# Patient Record
Sex: Female | Born: 1935 | Race: White | Hispanic: No | Marital: Single | Smoking: Current every day smoker
Health system: Southern US, Community
[De-identification: ages and names within clinical notes are randomized; demographics above are authoritative.]

## PROBLEM LIST (undated history)

## (undated) DIAGNOSIS — J90 Pleural effusion, not elsewhere classified: Secondary | ICD-10-CM

## (undated) DIAGNOSIS — K7460 Unspecified cirrhosis of liver: Secondary | ICD-10-CM

## (undated) DIAGNOSIS — I10 Essential (primary) hypertension: Secondary | ICD-10-CM

## (undated) DIAGNOSIS — M549 Dorsalgia, unspecified: Secondary | ICD-10-CM

## (undated) DIAGNOSIS — Z9689 Presence of other specified functional implants: Secondary | ICD-10-CM

## (undated) DIAGNOSIS — I6529 Occlusion and stenosis of unspecified carotid artery: Secondary | ICD-10-CM

## (undated) DIAGNOSIS — J189 Pneumonia, unspecified organism: Secondary | ICD-10-CM

## (undated) DIAGNOSIS — K529 Noninfective gastroenteritis and colitis, unspecified: Secondary | ICD-10-CM

## (undated) DIAGNOSIS — E119 Type 2 diabetes mellitus without complications: Secondary | ICD-10-CM

## (undated) HISTORY — PX: CAROTID ENDARTERECTOMY: SUR193

---

## 2013-10-12 ENCOUNTER — Encounter (HOSPITAL_COMMUNITY): Payer: Self-pay | Admitting: Emergency Medicine

## 2013-10-12 ENCOUNTER — Observation Stay (HOSPITAL_COMMUNITY): Payer: Medicare HMO

## 2013-10-12 ENCOUNTER — Inpatient Hospital Stay (HOSPITAL_COMMUNITY)
Admission: EM | Admit: 2013-10-12 | Discharge: 2013-10-21 | DRG: 029 | Disposition: A | Discharge status: Skilled Nursing Facility | Payer: Medicare HMO | Attending: Internal Medicine | Admitting: Internal Medicine

## 2013-10-12 ENCOUNTER — Emergency Department (HOSPITAL_COMMUNITY): Payer: Medicare HMO

## 2013-10-12 DIAGNOSIS — F329 Major depressive disorder, single episode, unspecified: Secondary | ICD-10-CM | POA: Diagnosis present

## 2013-10-12 DIAGNOSIS — Z9889 Other specified postprocedural states: Secondary | ICD-10-CM

## 2013-10-12 DIAGNOSIS — Z79899 Other long term (current) drug therapy: Secondary | ICD-10-CM

## 2013-10-12 DIAGNOSIS — F3289 Other specified depressive episodes: Secondary | ICD-10-CM | POA: Diagnosis present

## 2013-10-12 DIAGNOSIS — E1129 Type 2 diabetes mellitus with other diabetic kidney complication: Secondary | ICD-10-CM | POA: Diagnosis present

## 2013-10-12 DIAGNOSIS — D3500 Benign neoplasm of unspecified adrenal gland: Secondary | ICD-10-CM | POA: Diagnosis present

## 2013-10-12 DIAGNOSIS — E098 Drug or chemical induced diabetes mellitus with unspecified complications: Secondary | ICD-10-CM

## 2013-10-12 DIAGNOSIS — K828 Other specified diseases of gallbladder: Secondary | ICD-10-CM | POA: Diagnosis present

## 2013-10-12 DIAGNOSIS — A498 Other bacterial infections of unspecified site: Secondary | ICD-10-CM | POA: Diagnosis present

## 2013-10-12 DIAGNOSIS — K7689 Other specified diseases of liver: Secondary | ICD-10-CM | POA: Diagnosis present

## 2013-10-12 DIAGNOSIS — M545 Low back pain, unspecified: Secondary | ICD-10-CM | POA: Diagnosis not present

## 2013-10-12 DIAGNOSIS — R339 Retention of urine, unspecified: Secondary | ICD-10-CM | POA: Diagnosis not present

## 2013-10-12 DIAGNOSIS — E871 Hypo-osmolality and hyponatremia: Secondary | ICD-10-CM | POA: Diagnosis present

## 2013-10-12 DIAGNOSIS — K529 Noninfective gastroenteritis and colitis, unspecified: Secondary | ICD-10-CM | POA: Insufficient documentation

## 2013-10-12 DIAGNOSIS — M4180 Other forms of scoliosis, site unspecified: Secondary | ICD-10-CM | POA: Diagnosis present

## 2013-10-12 DIAGNOSIS — G062 Extradural and subdural abscess, unspecified: Secondary | ICD-10-CM

## 2013-10-12 DIAGNOSIS — G061 Intraspinal abscess and granuloma: Principal | ICD-10-CM | POA: Diagnosis present

## 2013-10-12 DIAGNOSIS — I129 Hypertensive chronic kidney disease with stage 1 through stage 4 chronic kidney disease, or unspecified chronic kidney disease: Secondary | ICD-10-CM | POA: Diagnosis present

## 2013-10-12 DIAGNOSIS — K760 Fatty (change of) liver, not elsewhere classified: Secondary | ICD-10-CM | POA: Diagnosis present

## 2013-10-12 DIAGNOSIS — I509 Heart failure, unspecified: Secondary | ICD-10-CM | POA: Diagnosis present

## 2013-10-12 DIAGNOSIS — D649 Anemia, unspecified: Secondary | ICD-10-CM

## 2013-10-12 DIAGNOSIS — E1165 Type 2 diabetes mellitus with hyperglycemia: Secondary | ICD-10-CM | POA: Diagnosis not present

## 2013-10-12 DIAGNOSIS — E119 Type 2 diabetes mellitus without complications: Secondary | ICD-10-CM | POA: Diagnosis present

## 2013-10-12 DIAGNOSIS — M5137 Other intervertebral disc degeneration, lumbosacral region: Secondary | ICD-10-CM | POA: Diagnosis present

## 2013-10-12 DIAGNOSIS — M51379 Other intervertebral disc degeneration, lumbosacral region without mention of lumbar back pain or lower extremity pain: Secondary | ICD-10-CM | POA: Diagnosis present

## 2013-10-12 DIAGNOSIS — N1330 Unspecified hydronephrosis: Secondary | ICD-10-CM | POA: Diagnosis not present

## 2013-10-12 DIAGNOSIS — N058 Unspecified nephritic syndrome with other morphologic changes: Secondary | ICD-10-CM | POA: Diagnosis present

## 2013-10-12 DIAGNOSIS — E872 Acidosis, unspecified: Secondary | ICD-10-CM | POA: Diagnosis present

## 2013-10-12 DIAGNOSIS — G834 Cauda equina syndrome: Secondary | ICD-10-CM | POA: Diagnosis present

## 2013-10-12 DIAGNOSIS — E1169 Type 2 diabetes mellitus with other specified complication: Secondary | ICD-10-CM

## 2013-10-12 DIAGNOSIS — N390 Urinary tract infection, site not specified: Secondary | ICD-10-CM | POA: Diagnosis present

## 2013-10-12 DIAGNOSIS — N12 Tubulo-interstitial nephritis, not specified as acute or chronic: Secondary | ICD-10-CM | POA: Insufficient documentation

## 2013-10-12 DIAGNOSIS — I10 Essential (primary) hypertension: Secondary | ICD-10-CM | POA: Diagnosis present

## 2013-10-12 DIAGNOSIS — N2589 Other disorders resulting from impaired renal tubular function: Secondary | ICD-10-CM | POA: Diagnosis present

## 2013-10-12 DIAGNOSIS — Z794 Long term (current) use of insulin: Secondary | ICD-10-CM

## 2013-10-12 DIAGNOSIS — N179 Acute kidney failure, unspecified: Secondary | ICD-10-CM | POA: Diagnosis present

## 2013-10-12 DIAGNOSIS — IMO0002 Reserved for concepts with insufficient information to code with codable children: Secondary | ICD-10-CM | POA: Diagnosis not present

## 2013-10-12 DIAGNOSIS — I5032 Chronic diastolic (congestive) heart failure: Secondary | ICD-10-CM | POA: Diagnosis present

## 2013-10-12 DIAGNOSIS — M549 Dorsalgia, unspecified: Secondary | ICD-10-CM | POA: Insufficient documentation

## 2013-10-12 DIAGNOSIS — N189 Chronic kidney disease, unspecified: Secondary | ICD-10-CM | POA: Diagnosis present

## 2013-10-12 HISTORY — DX: Essential (primary) hypertension: I10

## 2013-10-12 HISTORY — DX: Dorsalgia, unspecified: M54.9

## 2013-10-12 HISTORY — DX: Occlusion and stenosis of unspecified carotid artery: I65.29

## 2013-10-12 HISTORY — DX: Noninfective gastroenteritis and colitis, unspecified: K52.9

## 2013-10-12 HISTORY — DX: Type 2 diabetes mellitus without complications: E11.9

## 2013-10-12 LAB — OSMOLALITY: Osmolality: 279 mOsm/kg (ref 275–300)

## 2013-10-12 LAB — URINE MICROSCOPIC-ADD ON

## 2013-10-12 LAB — CBC
HCT: 33.7 % — ABNORMAL LOW (ref 36.0–46.0)
HEMOGLOBIN: 12.3 g/dL (ref 12.0–15.0)
MCH: 33.9 pg (ref 26.0–34.0)
MCHC: 36.5 g/dL — ABNORMAL HIGH (ref 30.0–36.0)
MCV: 92.8 fL (ref 78.0–100.0)
Platelets: 273 10*3/uL (ref 150–400)
RBC: 3.63 MIL/uL — AB (ref 3.87–5.11)
RDW: 14 % (ref 11.5–15.5)
WBC: 17.6 10*3/uL — ABNORMAL HIGH (ref 4.0–10.5)

## 2013-10-12 LAB — PRO B NATRIURETIC PEPTIDE: Pro B Natriuretic peptide (BNP): 1099 pg/mL — ABNORMAL HIGH (ref 0–450)

## 2013-10-12 LAB — CBG MONITORING, ED
Glucose-Capillary: 285 mg/dL — ABNORMAL HIGH (ref 70–99)
Glucose-Capillary: 256 mg/dL — ABNORMAL HIGH (ref 70–99)

## 2013-10-12 LAB — URINALYSIS, ROUTINE W REFLEX MICROSCOPIC
Glucose, UA: 250 mg/dL — AB
KETONES UR: NEGATIVE mg/dL
Nitrite: NEGATIVE
Protein, ur: NEGATIVE mg/dL
Specific Gravity, Urine: 1.016 (ref 1.005–1.030)
UROBILINOGEN UA: 4 mg/dL — AB (ref 0.0–1.0)
pH: 5.5 (ref 5.0–8.0)

## 2013-10-12 LAB — GLUCOSE, CAPILLARY
Glucose-Capillary: 152 mg/dL — ABNORMAL HIGH (ref 70–99)
Glucose-Capillary: 165 mg/dL — ABNORMAL HIGH (ref 70–99)

## 2013-10-12 LAB — COMPREHENSIVE METABOLIC PANEL
ALK PHOS: 176 U/L — AB (ref 39–117)
ALT: 21 U/L (ref 0–35)
AST: 24 U/L (ref 0–37)
Albumin: 2.2 g/dL — ABNORMAL LOW (ref 3.5–5.2)
Anion gap: 15 (ref 5–15)
BUN: 47 mg/dL — ABNORMAL HIGH (ref 6–23)
CO2: 18 meq/L — AB (ref 19–32)
Calcium: 8.9 mg/dL (ref 8.4–10.5)
Chloride: 90 mEq/L — ABNORMAL LOW (ref 96–112)
Creatinine, Ser: 1.49 mg/dL — ABNORMAL HIGH (ref 0.50–1.10)
GFR calc Af Amer: 38 mL/min — ABNORMAL LOW (ref >90)
GFR calc non Af Amer: 33 mL/min — ABNORMAL LOW (ref >90)
Glucose, Bld: 316 mg/dL — ABNORMAL HIGH (ref 70–99)
Potassium: 3.6 mEq/L — ABNORMAL LOW (ref 3.7–5.3)
SODIUM: 123 meq/L — AB (ref 137–147)
Total Bilirubin: 2.9 mg/dL — ABNORMAL HIGH (ref 0.3–1.2)
Total Protein: 7.3 g/dL (ref 6.0–8.3)

## 2013-10-12 LAB — HEMOGLOBIN A1C
Hgb A1c MFr Bld: 8.7 % — ABNORMAL HIGH
Mean Plasma Glucose: 203 mg/dL — ABNORMAL HIGH

## 2013-10-12 LAB — LACTIC ACID, PLASMA: Lactic Acid, Venous: 1.7 mmol/L (ref 0.5–2.2)

## 2013-10-12 MED ORDER — SODIUM CHLORIDE 0.9 % IV SOLN
INTRAVENOUS | Status: DC
  Administered 2013-10-13 – 2013-10-15 (×5): via INTRAVENOUS

## 2013-10-12 MED ORDER — DEXTROSE 5 % IV SOLN
1.0000 g | INTRAVENOUS | Status: DC
  Administered 2013-10-13: 1 g via INTRAVENOUS
  Filled 2013-10-12 (×2): qty 10

## 2013-10-12 MED ORDER — FAMOTIDINE 40 MG PO TABS
40.0000 mg | ORAL_TABLET | Freq: Every day | ORAL | Status: DC
  Administered 2013-10-12 – 2013-10-21 (×9): 40 mg via ORAL
  Filled 2013-10-12 (×8): qty 1
  Filled 2013-10-12: qty 2
  Filled 2013-10-12: qty 1

## 2013-10-12 MED ORDER — INSULIN ASPART 100 UNIT/ML ~~LOC~~ SOLN
3.0000 [IU] | Freq: Three times a day (TID) | SUBCUTANEOUS | Status: DC
  Administered 2013-10-12: 3 [IU] via SUBCUTANEOUS
  Administered 2013-10-14: 10:00:00 via SUBCUTANEOUS
  Administered 2013-10-15 (×2): 3 [IU] via SUBCUTANEOUS
  Filled 2013-10-12: qty 1

## 2013-10-12 MED ORDER — CYCLOBENZAPRINE HCL 10 MG PO TABS
10.0000 mg | ORAL_TABLET | Freq: Once | ORAL | Status: AC
  Administered 2013-10-12: 10 mg via ORAL
  Filled 2013-10-12: qty 1

## 2013-10-12 MED ORDER — HYDROMORPHONE HCL PF 1 MG/ML IJ SOLN
1.0000 mg | Freq: Once | INTRAMUSCULAR | Status: AC
  Administered 2013-10-12: 1 mg via INTRAMUSCULAR
  Filled 2013-10-12: qty 1

## 2013-10-12 MED ORDER — BUPROPION HCL ER (XL) 300 MG PO TB24
300.0000 mg | ORAL_TABLET | Freq: Every day | ORAL | Status: DC
  Administered 2013-10-13 – 2013-10-21 (×9): 300 mg via ORAL
  Filled 2013-10-12 (×10): qty 1

## 2013-10-12 MED ORDER — URSODIOL 300 MG PO CAPS
300.0000 mg | ORAL_CAPSULE | Freq: Three times a day (TID) | ORAL | Status: DC
  Administered 2013-10-12 – 2013-10-21 (×25): 300 mg via ORAL
  Filled 2013-10-12 (×29): qty 1

## 2013-10-12 MED ORDER — HYDROMORPHONE HCL PF 1 MG/ML IJ SOLN
1.0000 mg | INTRAMUSCULAR | Status: DC | PRN
  Administered 2013-10-12 – 2013-10-13 (×3): 1 mg via INTRAVENOUS
  Filled 2013-10-12 (×3): qty 1

## 2013-10-12 MED ORDER — IOHEXOL 300 MG/ML  SOLN
50.0000 mL | Freq: Once | INTRAMUSCULAR | Status: AC | PRN
  Administered 2013-10-12: 50 mL via ORAL

## 2013-10-12 MED ORDER — CEFTRIAXONE SODIUM 1 G IJ SOLR
1.0000 g | Freq: Once | INTRAMUSCULAR | Status: DC
  Administered 2013-10-12: 1 g via INTRAVENOUS
  Filled 2013-10-12: qty 10

## 2013-10-12 MED ORDER — HEPARIN SODIUM (PORCINE) 5000 UNIT/ML IJ SOLN
5000.0000 [IU] | Freq: Three times a day (TID) | INTRAMUSCULAR | Status: DC
  Administered 2013-10-12 – 2013-10-16 (×9): 5000 [IU] via SUBCUTANEOUS
  Filled 2013-10-12 (×15): qty 1

## 2013-10-12 MED ORDER — INSULIN ASPART 100 UNIT/ML ~~LOC~~ SOLN
0.0000 [IU] | Freq: Three times a day (TID) | SUBCUTANEOUS | Status: DC
  Administered 2013-10-12: 3 [IU] via SUBCUTANEOUS
  Administered 2013-10-12: 8 [IU] via SUBCUTANEOUS
  Administered 2013-10-13 (×3): 2 [IU] via SUBCUTANEOUS
  Administered 2013-10-14: 3 [IU] via SUBCUTANEOUS
  Administered 2013-10-15: 2 [IU] via SUBCUTANEOUS
  Administered 2013-10-16 (×3): 3 [IU] via SUBCUTANEOUS
  Administered 2013-10-17: 8 [IU] via SUBCUTANEOUS
  Administered 2013-10-17: 5 [IU] via SUBCUTANEOUS
  Administered 2013-10-17: 8 [IU] via SUBCUTANEOUS
  Administered 2013-10-18: 3 [IU] via SUBCUTANEOUS
  Administered 2013-10-18: 2 [IU] via SUBCUTANEOUS
  Administered 2013-10-18: 3 [IU] via SUBCUTANEOUS
  Administered 2013-10-19: 5 [IU] via SUBCUTANEOUS
  Administered 2013-10-20: 2 [IU] via SUBCUTANEOUS
  Filled 2013-10-12: qty 1

## 2013-10-12 MED ORDER — INSULIN DETEMIR 100 UNIT/ML ~~LOC~~ SOLN
9.0000 [IU] | Freq: Every day | SUBCUTANEOUS | Status: DC
  Administered 2013-10-12 – 2013-10-14 (×3): 9 [IU] via SUBCUTANEOUS
  Filled 2013-10-12 (×4): qty 0.09

## 2013-10-12 MED ORDER — SODIUM CHLORIDE 0.9 % IV BOLUS (SEPSIS)
500.0000 mL | Freq: Once | INTRAVENOUS | Status: AC
  Administered 2013-10-12: 500 mL via INTRAVENOUS

## 2013-10-12 MED ORDER — OXYCODONE HCL 5 MG PO TABS
5.0000 mg | ORAL_TABLET | ORAL | Status: DC | PRN
  Administered 2013-10-12 (×2): 5 mg via ORAL
  Filled 2013-10-12 (×2): qty 1

## 2013-10-12 NOTE — ED Notes (Signed)
She returns from CT at this time.

## 2013-10-12 NOTE — ED Notes (Signed)
Meal tray ordered for patient.

## 2013-10-12 NOTE — ED Notes (Signed)
Attempted to call report to [facility].  Nurse is at lunch will call back when she is done.

## 2013-10-12 NOTE — H&P (Signed)
[facility] History and Physical  Rogue Pautler ZCH:[id number] DOB: 08-10-1933 DOA: 10/12/2013  Referring physician: ED PCP: No primary provider on file.  Specialists: none  Chief Complaint: deranged labs, Pyelonephritis  HPI: 80 y/o ? known  IDDM Ty 2 sine ~2005, Htn, history fatty liver on ursodiol came to the ED with a three-week history of on-and-off fevers. Fever as high as 101.5. She felt poorly and has had no appetite for solid food instead eating cottage she is in food. She's been drinking copious amounts of water and 4 good days felt better. As she was having worsening symptoms without any specific findings and 7 low back pain she decided to come to the emergency room 10/12/13. She denies dysuria, cough, diarrhea (in fact is constipated) she has no shortness of breath no fever present no chills She states she's been having abdominal/back pain or the same time it started in the right upper quadrant and radiates around to the back. It is worsened by her laying flat on her back and she could not sleep last night needs to keep her right leg elevated. She's had no nausea no vomiting no symptoms no chest pain. She also complains of having weakness in her upper extremities and has lost in her upper arms is not able to take care of her 2 cats at home or lift them. She was able in the past left upper 100 pounds.  Emergency room workup significant for urinalysis showing glucose, turbid urine, large leukocytes hemoglobin many bacteria and squamous epithelial cells WBC 17.6 hemoglobin 12.3 platelets 273 Sodium 123 teslas 3.6 chloride 90 CO2 18 BUN/47 creatinine 1.49 Anion gap 15 alkaline phosphatase 176 albumin 2.2 total bilirubin 2.9 CT abdomen pelvis showed severe degenerative disc disease lumbar spine 2.2 cm left adrenal gland adenoma Liver compatible with cirrhosis, Gallbladder wall calcifications.    Review of Systems: The patient denies Constitutional symptoms as above She in addition  state she's had a rash over her lower extremities probably since beginning of the fever but this has resolved   Past Medical History  Diagnosis Date  . Back pain   . Hypertension   . Carotid stenosis   . Colitis   . Diabetes mellitus without complication    Past Surgical History  Procedure Laterality Date  . Carotid endarterectomy     Social History:  History   Social History Narrative  . No narrative on file    Allergies  Allergen Reactions  . Peanut-Containing Drug Products Nausea And Vomiting    Family History  Problem Relation Age of Onset  . Leukemia Mother   . Jaundice Father      Prior to Admission medications   Medication Sig Start Date End Date Taking? Authorizing Provider  amLODipine (NORVASC) 5 MG tablet Take 5 mg by mouth daily.   Yes Historical Provider, MD  buPROPion (WELLBUTRIN XL) 300 MG 24 hr tablet Take 300 mg by mouth daily.   Yes Historical Provider, MD  Cholecalciferol (VITAMIN D) 2000 UNITS tablet Take 2,000 Units by mouth daily.   Yes Historical Provider, MD  ferrous sulfate 325 (65 FE) MG tablet Take 325 mg by mouth daily with breakfast.   Yes Historical Provider, MD  HYDROcodone-ibuprofen (VICOPROFEN) 7.5-200 MG per tablet Take 1 tablet by mouth every 8 (eight) hours as needed for moderate pain.   Yes Historical Provider, MD  insulin detemir (LEVEMIR) 100 UNIT/ML injection Inject 9 Units into the skin at bedtime.   Yes Historical Provider, MD  lovastatin (MEVACOR)  20 MG tablet Take 20 mg by mouth at bedtime.   Yes Historical Provider, MD  ranitidine (ZANTAC) 300 MG tablet Take 300 mg by mouth at bedtime.   Yes Historical Provider, MD  ursodiol (ACTIGALL) 300 MG capsule Take 300 mg by mouth 3 (three) times daily.   Yes Historical Provider, MD   Physical Exam: Filed Vitals:   10/12/13 0744  BP: 115/84  Pulse: 65  Temp: 97.7 F (36.5 C)  TempSrc: Oral  Resp: 18  SpO2: 99%     General:  EOMI NCAT  Eyes:  no pallor mild icterus  ENT:   soft supple no JVD  Neck:  see above  Cardiovascular:  S1-S2 no murmur rub or gallop  Respiratory:  clinically clear  Abdomen:  somewhat full exam as patient cannot lay down without pain  Skin:  grade 1 lower extremity edema  Musculoskeletal:  range of motion intact  Psychiatric:  euthymic pleasant  Neurologic:  cranial nerves are grossly intact  Labs on Admission:  Basic Metabolic Panel:  Recent Labs Lab 10/12/13 0829  NA 123*  K 3.6*  CL 90*  CO2 18*  GLUCOSE 316*  BUN 47*  CREATININE 1.49*  CALCIUM 8.9   Liver Function Tests:  Recent Labs Lab 10/12/13 0829  AST 24  ALT 21  ALKPHOS 176*  BILITOT 2.9*  PROT 7.3  ALBUMIN 2.2*   No results found for this basename: LIPASE, AMYLASE,  in the last 168 hours No results found for this basename: AMMONIA,  in the last 168 hours CBC:  Recent Labs Lab 10/12/13 0829  WBC 17.6*  HGB 12.3  HCT 33.7*  MCV 92.8  PLT 273   Cardiac Enzymes: No results found for this basename: CKTOTAL, CKMB, CKMBINDEX, TROPONINI,  in the last 168 hours  BNP (last 3 results) No results found for this basename: PROBNP,  in the last 8760 hours CBG:  Recent Labs Lab 10/12/13 0853  GLUCAP 285*    Radiological Exams on Admission: Ct Abdomen Pelvis Wo Contrast  10/12/2013   CLINICAL DATA:  Back pain.  EXAM: CT ABDOMEN AND PELVIS WITHOUT CONTRAST  TECHNIQUE: Multidetector CT imaging of the abdomen and pelvis was performed following the standard protocol without IV contrast.  COMPARISON:  None.  FINDINGS: Few linear densities at lung bases are suggestive for atelectasis or scarring. There is no evidence for free intraperitoneal air.  The liver contour is diffusely nodular with mild enlargement of the left hepatic lobe. Findings are compatible with cirrhosis. There is no large or gross liver lesion. The gallbladder wall has diffuse calcifications and question focal calcifications versus a stone at the gallbladder base. The spleen is normal  for size.  There is a 2.2 cm low-density lesion in the left adrenal gland and the Hounsfield units are less than 10. Findings are compatible with a left adrenal adenoma. There may be at least 1 additional adenoma in the left adrenal gland. Small adenoma in the right adrenal gland. Normal appearance of the pancreas, both kidneys and the urinary bladder. No evidence for hydronephrosis or renal stones.  There is severe levoscoliosis in the lumbar spine with multilevel degenerative disease. There is vacuum disc phenomenon with gas bubbles throughout the lumbar spine. No gross abnormality to the uterus or adnexa tissue. There is colonic diverticulosis without acute colonic inflammation. Normal appearance of small bowel without an obstructive process. There is a ventral hernia containing fat.  IMPRESSION: Severe degenerative disease in the lumbar spine with levoscoliosis.  The  liver is diffusely nodular and compatible with cirrhosis. No evidence for ascites or splenomegaly.  Diffuse gallbladder wall calcifications. Findings may represent early stages of a porcelain gallbladder. Difficult to exclude a gallstone at the gallbladder base. The gallbladder could be further characterized with ultrasound.  Bilateral adrenal adenomas.   Electronically Signed   By: Markus Daft M.D.   On: 10/12/2013 10:31    EKG: Independently reviewed.  none performed  Assessment/Plan Active Problems:   Diabetes mellitus-type II. Uncontrolled at present time. We will reimplement her home dosages of 9 units levemir. For some reason she is not on a short-acting medication so we'll add sliding scale to this and get an HbA1c   Hyponatremia-sodium 123 no baseline. Will continue slow IV 50 cc/Hr. She has been drinking a lot of water because her appetite has been poor and this may be the cause of her hyponatremia. She's not on any diuretics or any salt wasting medications   Pyelonephritis-presumed etiology of leukocytosis. We will continue  Rocephin every 24 hourly and reassess CBC plus differential in the a.m. If this does not resolve, we will look for other sources of leukocytosis.   Acute kidney injury-unclear baseline. Potentially secondary to poor by mouth intake   Metabolic acidosis-obtain lactic acid. Could be secondary to acute kidney injury and should resolve with IV saline. If we do not find a source, we'll get an ABG and work-up further   Hypertension-continue amlodipine-suggest outpatient alternatie option as this can casue LE swelling   Fatty liver disease, nonalcoholic-continue ursodiol we will ultrasound her kidneys to ensure that there is no stones.     Adrenal adenoma-we will need to have this reviewed by urologist as an outpatient .    low back pain-she has scoliosis and has been told in the past and she is to get this disease. It is unlikely that her pain is related to her fever I think 2 separate things are going on.    Inpatient   65 minutes MedSurg  Nita Sells [facility] Pager [phone]   If 7PM-7AM, please contact night-coverage [email or web address] Password [email or web address] 10/12/2013, 11:11 AM

## 2013-10-12 NOTE — ED Notes (Signed)
Bed: WA04 Expected date: 10/12/13 Expected time: 7:36 AM Means of arrival: Ambulance Comments: Back pain

## 2013-10-12 NOTE — ED Notes (Signed)
Ultrasound being done at bedside.

## 2013-10-12 NOTE — ED Notes (Signed)
She comes to [place] from  [facility] with c/o recent worsening of her chronic back pain.  She is in no distress.

## 2013-10-12 NOTE — ED Notes (Signed)
Report given to floor nurse

## 2013-10-12 NOTE — ED Provider Notes (Signed)
CSN: [id number]     Arrival date & time 10/12/13  2035 History   First MD Initiated Contact with Patient 10/12/13 [phone]     Chief Complaint  Patient presents with  . Back Pain     (Consider location/radiation/quality/duration/timing/severity/associated sxs/prior Treatment) Patient is a 80 y.o. female presenting with back pain. The history is provided by the patient.  Back Pain Location:  Lumbar spine Quality:  Stabbing and shooting Radiates to:  L posterior upper leg and R posterior upper leg Pain severity:  Moderate Pain is:  Same all the time Onset quality:  Sudden Timing:  Constant Chronicity:  Recurrent (had once before, given demerol with great relief) Context: not occupational injury, not recent illness, not recent injury and not twisting   Context comment:  Was relaxing watching TV Relieved by:  Nothing Worsened by:  Nothing tried Associated symptoms: fever (for past two weeks, improved 2 days ago)   Associated symptoms: no abdominal pain     Past Medical History  Diagnosis Date  . Back pain    No past surgical history on file. No family history on file. History  Substance Use Topics  . Smoking status: Not on file  . Smokeless tobacco: Not on file  . Alcohol Use: Not on file   OB History   Grav Para Term Preterm Abortions TAB SAB Ect Mult Living                 Review of Systems  Constitutional: Positive for fever (for past two weeks, improved 2 days ago). Negative for chills.  Respiratory: Negative for cough and shortness of breath.   Gastrointestinal: Negative for vomiting and abdominal pain.  Musculoskeletal: Positive for back pain.  All other systems reviewed and are negative.     Allergies  Peanut-containing drug products  Home Medications   Prior to Admission medications   Medication Sig Start Date End Date Taking? Authorizing Provider  amLODipine (NORVASC) 5 MG tablet Take 5 mg by mouth daily.   Yes Historical Provider, MD  buPROPion  (WELLBUTRIN XL) 300 MG 24 hr tablet Take 300 mg by mouth daily.   Yes Historical Provider, MD  Cholecalciferol (VITAMIN D) 2000 UNITS tablet Take 2,000 Units by mouth daily.   Yes Historical Provider, MD  ferrous sulfate 325 (65 FE) MG tablet Take 325 mg by mouth daily with breakfast.   Yes Historical Provider, MD  HYDROcodone-ibuprofen (VICOPROFEN) 7.5-200 MG per tablet Take 1 tablet by mouth every 8 (eight) hours as needed for moderate pain.   Yes Historical Provider, MD  insulin detemir (LEVEMIR) 100 UNIT/ML injection Inject 9 Units into the skin at bedtime.   Yes Historical Provider, MD  lovastatin (MEVACOR) 20 MG tablet Take 20 mg by mouth at bedtime.   Yes Historical Provider, MD  ranitidine (ZANTAC) 300 MG tablet Take 300 mg by mouth at bedtime.   Yes Historical Provider, MD  ursodiol (ACTIGALL) 300 MG capsule Take 300 mg by mouth 3 (three) times daily.   Yes Historical Provider, MD   BP 115/84  Pulse 65  Temp(Src) 97.7 F (36.5 C) (Oral)  Resp 18  SpO2 99% Physical Exam  Nursing note and vitals reviewed. Constitutional: She is oriented to person, place, and time. She appears well-developed and well-nourished. No distress.  HENT:  Head: Normocephalic and atraumatic.  Mouth/Throat: Oropharynx is clear and moist.  Eyes: EOM are normal. Pupils are equal, round, and reactive to light.  Neck: Normal range of motion. Neck supple.  Cardiovascular:  Normal rate and regular rhythm.  Exam reveals no friction rub.   No murmur heard. Pulmonary/Chest: Effort normal and breath sounds normal. No respiratory distress. She has no wheezes. She has no rales.  Abdominal: Soft. She exhibits no distension. There is no tenderness. There is no rebound.  Musculoskeletal: Normal range of motion. She exhibits no edema.       Lumbar back: She exhibits tenderness (L lower lumbar area). She exhibits normal range of motion, no deformity, no laceration and normal pulse.  Neurological: She is alert and oriented to  person, place, and time.  Skin: She is not diaphoretic.    ED Course  Procedures (including critical care time) Labs Review Labs Reviewed  CBC  COMPREHENSIVE METABOLIC PANEL  URINALYSIS, ROUTINE W REFLEX MICROSCOPIC    Imaging Review Ct Abdomen Pelvis Wo Contrast  10/12/2013   CLINICAL DATA:  Back pain.  EXAM: CT ABDOMEN AND PELVIS WITHOUT CONTRAST  TECHNIQUE: Multidetector CT imaging of the abdomen and pelvis was performed following the standard protocol without IV contrast.  COMPARISON:  None.  FINDINGS: Few linear densities at lung bases are suggestive for atelectasis or scarring. There is no evidence for free intraperitoneal air.  The liver contour is diffusely nodular with mild enlargement of the left hepatic lobe. Findings are compatible with cirrhosis. There is no large or gross liver lesion. The gallbladder wall has diffuse calcifications and question focal calcifications versus a stone at the gallbladder base. The spleen is normal for size.  There is a 2.2 cm low-density lesion in the left adrenal gland and the Hounsfield units are less than 10. Findings are compatible with a left adrenal adenoma. There may be at least 1 additional adenoma in the left adrenal gland. Small adenoma in the right adrenal gland. Normal appearance of the pancreas, both kidneys and the urinary bladder. No evidence for hydronephrosis or renal stones.  There is severe levoscoliosis in the lumbar spine with multilevel degenerative disease. There is vacuum disc phenomenon with gas bubbles throughout the lumbar spine. No gross abnormality to the uterus or adnexa tissue. There is colonic diverticulosis without acute colonic inflammation. Normal appearance of small bowel without an obstructive process. There is a ventral hernia containing fat.  IMPRESSION: Severe degenerative disease in the lumbar spine with levoscoliosis.  The liver is diffusely nodular and compatible with cirrhosis. No evidence for ascites or  splenomegaly.  Diffuse gallbladder wall calcifications. Findings may represent early stages of a porcelain gallbladder. Difficult to exclude a gallstone at the gallbladder base. The gallbladder could be further characterized with ultrasound.  Bilateral adrenal adenomas.   Electronically Signed   By: Markus Daft M.D.   On: 10/12/2013 10:31   US Abdomen Complete  10/12/2013   CLINICAL DATA:  Liver, renal pathology. Abnormal CT. Abdominal pain and flank pain. Elevated alkaline phosphatase and bilirubin.  EXAM: ULTRASOUND ABDOMEN COMPLETE  COMPARISON:  CT of the abdomen and pelvis on 10/12/2013  FINDINGS: Gallbladder:  The gallbladder wall is calcified. Gallbladder wall is 2 mm in thickness. No definite stones are identified. No pericholecystic fluid or sonographic Murphy sign.  Common bile duct:  Diameter: Estimated to be 5 mm. The common bile duct is difficult to visualize in its entire course.  Liver:  The liver appears heterogeneous and nodular, consistent with cirrhosis. No focal liver lesions are identified.  IVC:  The intrahepatic inferior vena cava is not well seen. Visualized portion has a normal appearance.  Pancreas:  Pancreatic tail is not well seen. The  visualized portion is normal in appearance.  Spleen:  6.9 cm and normal in appearance.  Right Kidney:  Length: 9.6 cm. Echogenicity within normal limits. No mass or hydronephrosis visualized.  Left Kidney:  Length: 11.7 cm. Echogenicity within normal limits. No mass or hydronephrosis visualized.  Abdominal aorta:  Visualized portion not aneurysmal.  Other findings:  Study is technically difficult because of patient body habitus.  IMPRESSION: 1. Findings consistent with hepatic cirrhosis. No focal mass identified. 2. Porcelain gallbladder. No definite stones identified. No evidence for acute cholecystitis. 3. No hydronephrosis. 4. Difficult exam because of patient body habitus.   Electronically Signed   By: Shon Hale M.D.   On: 10/12/2013 14:19     EKG  Interpretation None      MDM   Final diagnoses:  Hyponatremia  UTI (lower urinary tract infection)  Bilateral low back pain without sciatica    48F with hx of back pain, HTN, HLD, DM presents with back pain. Began last night, improving. Lower back, radiating posteriorly down both legs, L>R. States some difficulty urinating, no pain or burning.  Reports myriad of strange symptoms like high temps for past few days, then "back to normal" low temps (in the 80s) for past few days. Also reports intermittently in the past having weakness in her arms not allowing her to pickup her cats. Vitals stable today, normal strength in arms, no fevers or hypothermia.  No abdominal pain. Normal strength and sensation. Will do some basic labs, give pain meds. Last time she had this, she got demerol and in 3 minutes felt like, "I could dance on the tables."  No concern for rupturing AAA, kidney stone. Normal neuro exam.  Labs show hyponatremia, possible UTI. Rocephin given, urine culture sent. Will admit for her hyponatremia at 123.   Evelina Bucy, MD 10/12/13 1535

## 2013-10-13 DIAGNOSIS — K828 Other specified diseases of gallbladder: Secondary | ICD-10-CM | POA: Diagnosis present

## 2013-10-13 LAB — CBC
HCT: 33.4 % — ABNORMAL LOW (ref 36.0–46.0)
Hemoglobin: 11.7 g/dL — ABNORMAL LOW (ref 12.0–15.0)
MCH: 33.5 pg (ref 26.0–34.0)
MCHC: 35 g/dL (ref 30.0–36.0)
MCV: 95.7 fL (ref 78.0–100.0)
PLATELETS: 254 10*3/uL (ref 150–400)
RBC: 3.49 MIL/uL — ABNORMAL LOW (ref 3.87–5.11)
RDW: 14 % (ref 11.5–15.5)
WBC: 16.3 10*3/uL — AB (ref 4.0–10.5)

## 2013-10-13 LAB — COMPREHENSIVE METABOLIC PANEL
ALT: 19 U/L (ref 0–35)
AST: 25 U/L (ref 0–37)
Albumin: 1.9 g/dL — ABNORMAL LOW (ref 3.5–5.2)
Alkaline Phosphatase: 156 U/L — ABNORMAL HIGH (ref 39–117)
Anion gap: 14 (ref 5–15)
BILIRUBIN TOTAL: 2.8 mg/dL — AB (ref 0.3–1.2)
BUN: 36 mg/dL — ABNORMAL HIGH (ref 6–23)
CHLORIDE: 98 meq/L (ref 96–112)
CO2: 16 meq/L — AB (ref 19–32)
CREATININE: 1.22 mg/dL — AB (ref 0.50–1.10)
Calcium: 8.3 mg/dL — ABNORMAL LOW (ref 8.4–10.5)
GFR, EST AFRICAN AMERICAN: 48 mL/min — AB (ref >90)
GFR, EST NON AFRICAN AMERICAN: 42 mL/min — AB (ref >90)
GLUCOSE: 149 mg/dL — AB (ref 70–99)
Potassium: 3.7 mEq/L (ref 3.7–5.3)
Sodium: 128 mEq/L — ABNORMAL LOW (ref 137–147)
Total Protein: 6.6 g/dL (ref 6.0–8.3)

## 2013-10-13 LAB — GLUCOSE, CAPILLARY
GLUCOSE-CAPILLARY: 122 mg/dL — AB (ref 70–99)
GLUCOSE-CAPILLARY: 131 mg/dL — AB (ref 70–99)
Glucose-Capillary: 122 mg/dL — ABNORMAL HIGH (ref 70–99)
Glucose-Capillary: 206 mg/dL — ABNORMAL HIGH (ref 70–99)

## 2013-10-13 LAB — PROTIME-INR
INR: 1.13 (ref 0.00–1.49)
Prothrombin Time: 14.5 seconds (ref 11.6–15.2)

## 2013-10-13 LAB — AMMONIA: Ammonia: 38 umol/L (ref 11–60)

## 2013-10-13 MED ORDER — OXYCODONE HCL 5 MG PO TABS
5.0000 mg | ORAL_TABLET | ORAL | Status: DC | PRN
  Administered 2013-10-13 – 2013-10-14 (×4): 10 mg via ORAL
  Filled 2013-10-13 (×4): qty 2

## 2013-10-13 NOTE — Progress Notes (Addendum)
Note: This document was prepared with digital dictation and possible smart phrase technology. Any transcriptional errors that result from this process are unintentional.   Tamara Stephenson IZT:[id number] DOB: October 13, 1933 DOA: 10/12/2013 PCP: No primary provider on file.  Brief narrative: 80 y/o ? known IDDM Ty 2 since ~2005, Htn, history fatty liver on ursodiol came to the ED with a three-week history of on-and-off fevers.  Ct showed Scoliosis and DDD-also hyponatremia 123, and AKI.  Was started on Rocephin   Past medical history-As per Problem list Chart reviewed as below-   Consultants:    Procedures:    Antibiotics:  Rocephin 10/13/13   Subjective  Sleeping but aroused Pain imporve don medicaiton No fever, no chills No dysuria    Objective    Interim History:   Telemetry:    Objective: Filed Vitals:   10/12/13 2152 10/12/13 2255 10/13/13 0631 10/13/13 0635  BP: 125/69  143/55   Pulse: 63 66 67   Temp: 98.1 F (36.7 C)  97.5 F (36.4 C)   TempSrc: Oral  Oral   Resp: 20  20   Height:      Weight:    96.163 kg (212 lb)  SpO2: 96%  100%     Intake/Output Summary (Last 24 hours) at 10/13/13 1146 Last data filed at 10/13/13 0900  Gross per 24 hour  Intake 1004.17 ml  Output      0 ml  Net 1004.17 ml    Exam:  General: alert obese  Cardiovascular: sq s 2no m.r.g Respiratory: clea rno adde dsoudn Abdomen: soft, NT/ND Skin no LE edema Neuro intact  Data Reviewed: Basic Metabolic Panel:  Recent Labs Lab 10/12/13 0829 10/13/13 0525  NA 123* 128*  K 3.6* 3.7  CL 90* 98  CO2 18* 16*  GLUCOSE 316* 149*  BUN 47* 36*  CREATININE 1.49* 1.22*  CALCIUM 8.9 8.3*   Liver Function Tests:  Recent Labs Lab 10/12/13 0829 10/13/13 0525  AST 24 25  ALT 21 19  ALKPHOS 176* 156*  BILITOT 2.9* 2.8*  PROT 7.3 6.6  ALBUMIN 2.2* 1.9*   No results found for this basename: LIPASE, AMYLASE,  in the last 168 hours No results found for this basename:  AMMONIA,  in the last 168 hours CBC:  Recent Labs Lab 10/12/13 0829 10/13/13 0525  WBC 17.6* 16.3*  HGB 12.3 11.7*  HCT 33.7* 33.4*  MCV 92.8 95.7  PLT 273 254   Cardiac Enzymes: No results found for this basename: CKTOTAL, CKMB, CKMBINDEX, TROPONINI,  in the last 168 hours BNP: No components found with this basename: POCBNP,  CBG:  Recent Labs Lab 10/12/13 1252 10/12/13 1731 10/12/13 2137 10/13/13 0728 10/13/13 1138  GLUCAP 256* 152* 165* 122* 122*    No results found for this or any previous visit (from the past 240 hour(s)).   Studies:              All Imaging reviewed and is as per above notation   Scheduled Meds: . buPROPion  300 mg Oral Daily  . cefTRIAXone (ROCEPHIN)  IV  1 g Intravenous Q24H  . famotidine  40 mg Oral Daily  . heparin  5,000 Units Subcutaneous 3 times per day  . insulin aspart  0-15 Units Subcutaneous TID WC  . insulin aspart  3 Units Subcutaneous TID WC  . insulin detemir  9 Units Subcutaneous QHS  . ursodiol  300 mg Oral TID   Continuous Infusions: . sodium chloride 50 mL/hr  at 10/12/13 1950     Assessment/Plan: 1. Pyelonephritis-continue Rocephin.  Await UC.  CBC + Diff in am. 2. 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  No emergent need for surgery 3. IDDM ty 2-blood sugars reasonable-continue levemir 9, SSI coeverage 4. Mild confusion-? 2/2 to meds-cut back IV opiates.  Monitor.  Get ammonia in view of #5 5. Fatty liver-stable.  continue Ursodiol 6. AKI-resolving 7. Hyponatremia-Correcting on IV saline 50 cc/hr.  Monitor as LE's looking a little swollen.  Posm was low nl, indicating likely tea-toast potomania-await urine studies 8. Depression-continue Bupropion 300 daily  Code Status: full Family Communication:  None bedsdie Disposition Plan: Uw with therapy/nursing   Tamara Griffes, MD  [facility] Pager [phone] 10/13/2013, 11:46 AM    LOS: 1 day

## 2013-10-13 NOTE — Plan of Care (Signed)
Problem: Phase I Progression Outcomes Goal: OOB as tolerated unless otherwise ordered Outcome: Progressing Standby assist at bedside

## 2013-10-13 NOTE — Plan of Care (Signed)
Problem: Phase I Progression Outcomes Goal: Voiding-avoid urinary catheter unless indicated Outcome: Progressing Incontinent at times

## 2013-10-14 DIAGNOSIS — Z79899 Other long term (current) drug therapy: Secondary | ICD-10-CM | POA: Diagnosis not present

## 2013-10-14 DIAGNOSIS — A498 Other bacterial infections of unspecified site: Secondary | ICD-10-CM | POA: Diagnosis present

## 2013-10-14 DIAGNOSIS — N1330 Unspecified hydronephrosis: Secondary | ICD-10-CM | POA: Diagnosis not present

## 2013-10-14 DIAGNOSIS — K828 Other specified diseases of gallbladder: Secondary | ICD-10-CM | POA: Diagnosis present

## 2013-10-14 DIAGNOSIS — IMO0002 Reserved for concepts with insufficient information to code with codable children: Secondary | ICD-10-CM | POA: Diagnosis not present

## 2013-10-14 DIAGNOSIS — M5137 Other intervertebral disc degeneration, lumbosacral region: Secondary | ICD-10-CM | POA: Diagnosis present

## 2013-10-14 DIAGNOSIS — M545 Low back pain, unspecified: Secondary | ICD-10-CM | POA: Diagnosis present

## 2013-10-14 DIAGNOSIS — N189 Chronic kidney disease, unspecified: Secondary | ICD-10-CM | POA: Diagnosis present

## 2013-10-14 DIAGNOSIS — N2589 Other disorders resulting from impaired renal tubular function: Secondary | ICD-10-CM | POA: Diagnosis present

## 2013-10-14 DIAGNOSIS — I5032 Chronic diastolic (congestive) heart failure: Secondary | ICD-10-CM | POA: Diagnosis present

## 2013-10-14 DIAGNOSIS — I509 Heart failure, unspecified: Secondary | ICD-10-CM | POA: Diagnosis present

## 2013-10-14 DIAGNOSIS — G834 Cauda equina syndrome: Secondary | ICD-10-CM | POA: Diagnosis present

## 2013-10-14 DIAGNOSIS — E1129 Type 2 diabetes mellitus with other diabetic kidney complication: Secondary | ICD-10-CM | POA: Diagnosis present

## 2013-10-14 DIAGNOSIS — D3500 Benign neoplasm of unspecified adrenal gland: Secondary | ICD-10-CM | POA: Diagnosis present

## 2013-10-14 DIAGNOSIS — F3289 Other specified depressive episodes: Secondary | ICD-10-CM | POA: Diagnosis present

## 2013-10-14 DIAGNOSIS — Z794 Long term (current) use of insulin: Secondary | ICD-10-CM | POA: Diagnosis not present

## 2013-10-14 DIAGNOSIS — G061 Intraspinal abscess and granuloma: Secondary | ICD-10-CM | POA: Diagnosis present

## 2013-10-14 DIAGNOSIS — I129 Hypertensive chronic kidney disease with stage 1 through stage 4 chronic kidney disease, or unspecified chronic kidney disease: Secondary | ICD-10-CM | POA: Diagnosis present

## 2013-10-14 DIAGNOSIS — N058 Unspecified nephritic syndrome with other morphologic changes: Secondary | ICD-10-CM | POA: Diagnosis present

## 2013-10-14 DIAGNOSIS — D649 Anemia, unspecified: Secondary | ICD-10-CM | POA: Diagnosis not present

## 2013-10-14 DIAGNOSIS — E871 Hypo-osmolality and hyponatremia: Secondary | ICD-10-CM | POA: Diagnosis present

## 2013-10-14 DIAGNOSIS — F329 Major depressive disorder, single episode, unspecified: Secondary | ICD-10-CM | POA: Diagnosis present

## 2013-10-14 DIAGNOSIS — M4180 Other forms of scoliosis, site unspecified: Secondary | ICD-10-CM | POA: Diagnosis present

## 2013-10-14 DIAGNOSIS — R339 Retention of urine, unspecified: Secondary | ICD-10-CM | POA: Diagnosis not present

## 2013-10-14 DIAGNOSIS — N12 Tubulo-interstitial nephritis, not specified as acute or chronic: Secondary | ICD-10-CM | POA: Diagnosis present

## 2013-10-14 DIAGNOSIS — K7689 Other specified diseases of liver: Secondary | ICD-10-CM | POA: Diagnosis present

## 2013-10-14 DIAGNOSIS — E1165 Type 2 diabetes mellitus with hyperglycemia: Secondary | ICD-10-CM | POA: Diagnosis not present

## 2013-10-14 DIAGNOSIS — N179 Acute kidney failure, unspecified: Secondary | ICD-10-CM | POA: Diagnosis present

## 2013-10-14 LAB — CBC WITH DIFFERENTIAL/PLATELET
Basophils Absolute: 0 K/uL (ref 0.0–0.1)
Basophils Relative: 0 % (ref 0–1)
Eosinophils Absolute: 0 K/uL (ref 0.0–0.7)
Eosinophils Relative: 0 % (ref 0–5)
HCT: 32.7 % — ABNORMAL LOW (ref 36.0–46.0)
Hemoglobin: 12 g/dL (ref 12.0–15.0)
Lymphocytes Relative: 7 % — ABNORMAL LOW (ref 12–46)
Lymphs Abs: 1.1 K/uL (ref 0.7–4.0)
MCH: 35.2 pg — ABNORMAL HIGH (ref 26.0–34.0)
MCHC: 36.7 g/dL — ABNORMAL HIGH (ref 30.0–36.0)
MCV: 95.9 fL (ref 78.0–100.0)
Monocytes Absolute: 1 K/uL (ref 0.1–1.0)
Monocytes Relative: 7 % (ref 3–12)
Neutro Abs: 12.9 K/uL — ABNORMAL HIGH (ref 1.7–7.7)
Neutrophils Relative %: 86 % — ABNORMAL HIGH (ref 43–77)
Platelets: 294 K/uL (ref 150–400)
RBC: 3.41 MIL/uL — ABNORMAL LOW (ref 3.87–5.11)
RDW: 14.3 % (ref 11.5–15.5)
WBC: 15 K/uL — ABNORMAL HIGH (ref 4.0–10.5)

## 2013-10-14 LAB — BASIC METABOLIC PANEL
ANION GAP: 15 (ref 5–15)
BUN: 42 mg/dL — ABNORMAL HIGH (ref 6–23)
CALCIUM: 8.6 mg/dL (ref 8.4–10.5)
CO2: 18 meq/L — AB (ref 19–32)
Chloride: 98 mEq/L (ref 96–112)
Creatinine, Ser: 1.54 mg/dL — ABNORMAL HIGH (ref 0.50–1.10)
GFR calc Af Amer: 36 mL/min — ABNORMAL LOW (ref >90)
GFR calc non Af Amer: 31 mL/min — ABNORMAL LOW (ref >90)
Glucose, Bld: 137 mg/dL — ABNORMAL HIGH (ref 70–99)
Potassium: 3.9 mEq/L (ref 3.7–5.3)
SODIUM: 131 meq/L — AB (ref 137–147)

## 2013-10-14 LAB — BASIC METABOLIC PANEL WITH GFR
Anion gap: 17 — ABNORMAL HIGH (ref 5–15)
BUN: 39 mg/dL — ABNORMAL HIGH (ref 6–23)
CO2: 15 meq/L — ABNORMAL LOW (ref 19–32)
Calcium: 8.5 mg/dL (ref 8.4–10.5)
Chloride: 98 meq/L (ref 96–112)
Creatinine, Ser: 1.35 mg/dL — ABNORMAL HIGH (ref 0.50–1.10)
GFR calc Af Amer: 43 mL/min — ABNORMAL LOW (ref >90)
GFR calc non Af Amer: 37 mL/min — ABNORMAL LOW (ref >90)
Glucose, Bld: 182 mg/dL — ABNORMAL HIGH (ref 70–99)
Potassium: 4 meq/L (ref 3.7–5.3)
Sodium: 130 meq/L — ABNORMAL LOW (ref 137–147)

## 2013-10-14 LAB — GLUCOSE, CAPILLARY
GLUCOSE-CAPILLARY: 84 mg/dL (ref 70–99)
Glucose-Capillary: 152 mg/dL — ABNORMAL HIGH (ref 70–99)
Glucose-Capillary: 74 mg/dL (ref 70–99)
Glucose-Capillary: 97 mg/dL (ref 70–99)

## 2013-10-14 LAB — URINE CULTURE
Colony Count: >=100000
Special Requests: NORMAL

## 2013-10-14 MED ORDER — GLUCERNA SHAKE PO LIQD
237.0000 mL | Freq: Two times a day (BID) | ORAL | Status: DC
  Administered 2013-10-14 – 2013-10-18 (×4): 237 mL via ORAL
  Filled 2013-10-14 (×5): qty 237

## 2013-10-14 MED ORDER — GABAPENTIN 300 MG PO CAPS
300.0000 mg | ORAL_CAPSULE | Freq: Every day | ORAL | Status: DC
  Administered 2013-10-14 – 2013-10-20 (×6): 300 mg via ORAL
  Filled 2013-10-14 (×8): qty 1

## 2013-10-14 MED ORDER — MORPHINE SULFATE 2 MG/ML IJ SOLN
1.0000 mg | INTRAMUSCULAR | Status: DC | PRN
  Administered 2013-10-17: 1 mg via INTRAVENOUS
  Filled 2013-10-14: qty 1

## 2013-10-14 MED ORDER — OXYCODONE HCL 5 MG PO TABS
5.0000 mg | ORAL_TABLET | ORAL | Status: DC
  Administered 2013-10-14 (×2): 5 mg via ORAL
  Administered 2013-10-14 – 2013-10-15 (×3): 10 mg via ORAL
  Administered 2013-10-15 (×2): 5 mg via ORAL
  Administered 2013-10-15: 10 mg via ORAL
  Administered 2013-10-15: 5 mg via ORAL
  Filled 2013-10-14 (×5): qty 2
  Filled 2013-10-14: qty 1
  Filled 2013-10-14: qty 2
  Filled 2013-10-14 (×2): qty 1
  Filled 2013-10-14: qty 2

## 2013-10-14 MED ORDER — LEVOFLOXACIN 500 MG PO TABS
500.0000 mg | ORAL_TABLET | ORAL | Status: DC
  Administered 2013-10-14 – 2013-10-18 (×3): 500 mg via ORAL
  Filled 2013-10-14 (×3): qty 1

## 2013-10-14 NOTE — Progress Notes (Signed)
INITIAL NUTRITION ASSESSMENT  DOCUMENTATION CODES Per approved criteria  -Obesity Unspecified   INTERVENTION: Glucerna Shake po BID, each supplement provides 220 kcal and 10 grams of protein  NUTRITION DIAGNOSIS: Inadequate oral intake related to pyelonephritis as evidenced by 0% meal completion.   Goal: Pt to meet >/= 90% of their estimated nutrition needs   Monitor:  Weight trends, po intake, labs  Reason for Assessment: MST  80 y.o. female  Admitting Dx: <principal problem not specified>  ASSESSMENT: 80 y/o ? known IDDM Ty 2 since ~2005, Htn, history fatty liver on ursodiol came to the ED with a three-week history of on-and-off fevers. Ct showed Scoliosis and DDD-also hyponatremia 123, and AKI. Was started on Rocephin  - Pt reports that she has no been eating well for the past several weeks. She has had no significant weight loss. Pt has had 0% meal completion since admission. Will be sent nutritional supplements until pt feels good enough to eat a meal. Pt with no significant fat or muscle wasting.   CBG's: 74-206  Height: Ht Readings from Last 1 Encounters:  10/14/13 5\' 6"  (1.676 m)    Weight: Wt Readings from Last 1 Encounters:  10/14/13 212 lb 8 oz (96.389 kg)    Ideal Body Weight: 59.3 kg  % Ideal Body Weight: 163%  Wt Readings from Last 10 Encounters:  10/14/13 212 lb 8 oz (96.389 kg)    Usual Body Weight: 205-206  % Usual Body Weight: 103%  BMI:  Body mass index is 34.31 kg/(m^2).  Estimated Nutritional Needs: Kcal: 6553-7482 Protein: 115-125 g Fluid: ~2.0 L/day  Skin: WNL  Diet Order: General  EDUCATION NEEDS: -Education not appropriate at this time   Intake/Output Summary (Last 24 hours) at 10/14/13 1236 Last data filed at 10/14/13 0911  Gross per 24 hour  Intake 1487.5 ml  Output      0 ml  Net 1487.5 ml    Last BM: prior to admission   Labs:   Recent Labs Lab 10/13/13 0525 10/14/13 0415 10/14/13 1027  NA 128* 130*  131*  K 3.7 4.0 3.9  CL 98 98 98  CO2 16* 15* 18*  BUN 36* 39* 42*  CREATININE 1.22* 1.35* 1.54*  CALCIUM 8.3* 8.5 8.6  GLUCOSE 149* 182* 137*    CBG (last 3)   Recent Labs  10/13/13 1717 10/13/13 2129 10/14/13 0731  GLUCAP 131* 206* 152*    Scheduled Meds: . buPROPion  300 mg Oral Daily  . famotidine  40 mg Oral Daily  . gabapentin  300 mg Oral QHS  . heparin  5,000 Units Subcutaneous 3 times per day  . insulin aspart  0-15 Units Subcutaneous TID WC  . insulin aspart  3 Units Subcutaneous TID WC  . insulin detemir  9 Units Subcutaneous QHS  . levofloxacin  500 mg Oral Q48H  . oxyCODONE  5-10 mg Oral Q4H  . ursodiol  300 mg Oral TID    Continuous Infusions: . sodium chloride 50 mL/hr at 10/14/13 7078    Past Medical History  Diagnosis Date  . Back pain   . Hypertension   . Carotid stenosis   . Colitis   . Diabetes mellitus without complication     Past Surgical History  Procedure Laterality Date  . Carotid endarterectomy      Terrace Arabia RD, LDN

## 2013-10-14 NOTE — Progress Notes (Signed)
Pt refusing to reposition off her back. She states to painful to move. Pt incontinent because she doesn't want to move to get on the bedpan or bedside commode. Pt assisted in rolling side to side to change her sheets and clean her up. Repositioned off her back with a pillow for short intervals. Rating her back pain #10/10

## 2013-10-14 NOTE — Progress Notes (Signed)
UR Completed.  Vergie Living [id number] 10/14/2013

## 2013-10-14 NOTE — Progress Notes (Signed)
Note: This document was prepared with digital dictation and possible smart phrase technology. Any transcriptional errors that result from this process are unintentional.   Tamara Stephenson OJJ:[id number] DOB: 1933-09-27 DOA: 10/12/2013 PCP: No primary provider on file.  Brief narrative: 80 y/o ? known IDDM Ty 2 since ~2005, Htn, history fatty liver on ursodiol came to the ED with a three-week history of on-and-off fevers.  Ct showed Scoliosis and DDD-also hyponatremia 123, and AKI.  Was started on Rocephin   Past medical history-As per Problem list Chart reviewed as below-   Consultants:    Procedures:    Antibiotics:  Rocephin 10/13/13   Subjective   uncomfortable overnight Upset that we cannot give demerol Rates pain 5/10 right now NOt eating much Spasms of pain    Objective    Interim History:   Telemetry:    Objective: Filed Vitals:   10/13/13 0635 10/13/13 1430 10/13/13 2132 10/14/13 0551  BP:  127/61 139/87 129/92  Pulse:  67 73 74  Temp:  98.1 F (36.7 C) 98 F (36.7 C) 97.4 F (36.3 C)  TempSrc:  Oral Oral Oral  Resp:  18 16 18   Height:      Weight: 96.163 kg (212 lb)   96.389 kg (212 lb 8 oz)  SpO2:  97% 96% 98%    Intake/Output Summary (Last 24 hours) at 10/14/13 1002 Last data filed at 10/14/13 0600  Gross per 24 hour  Intake 1407.5 ml  Output      0 ml  Net 1407.5 ml    Exam:  General: alert obese  Cardiovascular: sq s 2no m.r.g Respiratory: clea rno adde dsoudn Abdomen: soft, NT/ND Skin no LE edema Neuro intact  Data Reviewed: Basic Metabolic Panel:  Recent Labs Lab 10/12/13 0829 10/13/13 0525 10/14/13 0415  NA 123* 128* 130*  K 3.6* 3.7 4.0  CL 90* 98 98  CO2 18* 16* 15*  GLUCOSE 316* 149* 182*  BUN 47* 36* 39*  CREATININE 1.49* 1.22* 1.35*  CALCIUM 8.9 8.3* 8.5   Liver Function Tests:  Recent Labs Lab 10/12/13 0829 10/13/13 0525  AST 24 25  ALT 21 19  ALKPHOS 176* 156*  BILITOT 2.9* 2.8*  PROT 7.3 6.6    ALBUMIN 2.2* 1.9*   No results found for this basename: LIPASE, AMYLASE,  in the last 168 hours  Recent Labs Lab 10/13/13 1522  AMMONIA 38   CBC:  Recent Labs Lab 10/12/13 0829 10/13/13 0525 10/14/13 0415  WBC 17.6* 16.3* 15.0*  NEUTROABS  --   --  12.9*  HGB 12.3 11.7* 12.0  HCT 33.7* 33.4* 32.7*  MCV 92.8 95.7 95.9  PLT 273 254 294   Cardiac Enzymes: No results found for this basename: CKTOTAL, CKMB, CKMBINDEX, TROPONINI,  in the last 168 hours BNP: No components found with this basename: POCBNP,  CBG:  Recent Labs Lab 10/13/13 0728 10/13/13 1138 10/13/13 1717 10/13/13 2129 10/14/13 0731  GLUCAP 122* 122* 131* 206* 152*    Recent Results (from the past 240 hour(s))  URINE CULTURE     Status: None   Collection Time    10/12/13 10:42 AM      Result Value Ref Range Status   Specimen Description URINE, CATHETERIZED   Final   Special Requests Normal   Final   Culture  Setup Time     Final   Value: 10/12/2013 13:40     Performed at [facility]  Final   Value: >=100,000 COLONIES/ML     Performed at [facility]   Culture     Final   Value: ESCHERICHIA COLI     Performed at [facility]   Report Status 10/14/2013 FINAL   Final   Organism ID, Bacteria ESCHERICHIA COLI   Final     Studies:              All Imaging reviewed and is as per above notation   Scheduled Meds: . buPROPion  300 mg Oral Daily  . famotidine  40 mg Oral Daily  . gabapentin  300 mg Oral QHS  . heparin  5,000 Units Subcutaneous 3 times per day  . insulin aspart  0-15 Units Subcutaneous TID WC  . insulin aspart  3 Units Subcutaneous TID WC  . insulin detemir  9 Units Subcutaneous QHS  . levofloxacin  500 mg Oral Q48H  . oxyCODONE  5-10 mg Oral Q4H  . ursodiol  300 mg Oral TID   Continuous Infusions: . sodium chloride 50 mL/hr at 10/14/13 0911     Assessment/Plan: 1. Pyelonephritis-continue Rocephin.  Await UC.  CBC + Diff in  am. 2. 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  No emergent need for surgery 3. IDDM ty 2-blood sugars reasonable-continue levemir 9, SSI coverage-diet liberalized as need for increased salt.  Sugar 150-206 4. Mild confusion-? 2/2 to meds-added back IV opiates 8/10 as pain.  Monitor.  Ammonia 38 5. Fatty liver-stable.  continue Ursodiol 6. AKI-resolving slowly bun/creat 47/1.4->36/1.2-->39/1.5 7. Hyponatremia-Correcting on IV saline 50 cc/hr.  Monitor as LE's looking a little swollen.  Posm was low nl, indicating likely tea-toast potomania. lberalized diet.   8. Depression-continue Bupropion 300 daily  Code Status: full Family Communication:  None bedside Disposition Plan: Uw with therapy/nursing   [name], MD  [facility] Pager [phone] 10/14/2013, 10:02 AM    LOS: 2 days

## 2013-10-15 LAB — GLUCOSE, CAPILLARY
GLUCOSE-CAPILLARY: 140 mg/dL — AB (ref 70–99)
GLUCOSE-CAPILLARY: 40 mg/dL — AB (ref 70–99)
Glucose-Capillary: 133 mg/dL — ABNORMAL HIGH (ref 70–99)
Glucose-Capillary: 99 mg/dL (ref 70–99)
Glucose-Capillary: 167 mg/dL — ABNORMAL HIGH (ref 70–99)

## 2013-10-15 LAB — CBC WITH DIFFERENTIAL/PLATELET
Basophils Absolute: 0 10*3/uL (ref 0.0–0.1)
Basophils Relative: 0 % (ref 0–1)
EOS ABS: 0.1 10*3/uL (ref 0.0–0.7)
Eosinophils Relative: 0 % (ref 0–5)
HCT: 32.9 % — ABNORMAL LOW (ref 36.0–46.0)
HEMOGLOBIN: 11.4 g/dL — AB (ref 12.0–15.0)
LYMPHS ABS: 2.1 10*3/uL (ref 0.7–4.0)
LYMPHS PCT: 15 % (ref 12–46)
MCH: 33.5 pg (ref 26.0–34.0)
MCHC: 34.7 g/dL (ref 30.0–36.0)
MCV: 96.8 fL (ref 78.0–100.0)
MONOS PCT: 9 % (ref 3–12)
Monocytes Absolute: 1.3 10*3/uL — ABNORMAL HIGH (ref 0.1–1.0)
NEUTROS ABS: 10.8 10*3/uL — AB (ref 1.7–7.7)
Neutrophils Relative %: 76 % (ref 43–77)
PLATELETS: 296 10*3/uL (ref 150–400)
RBC: 3.4 MIL/uL — ABNORMAL LOW (ref 3.87–5.11)
RDW: 14.6 % (ref 11.5–15.5)
WBC: 14.3 10*3/uL — AB (ref 4.0–10.5)

## 2013-10-15 LAB — BASIC METABOLIC PANEL
ANION GAP: 13 (ref 5–15)
BUN: 48 mg/dL — AB (ref 6–23)
CHLORIDE: 99 meq/L (ref 96–112)
CO2: 18 mEq/L — ABNORMAL LOW (ref 19–32)
Calcium: 8.7 mg/dL (ref 8.4–10.5)
Creatinine, Ser: 1.98 mg/dL — ABNORMAL HIGH (ref 0.50–1.10)
GFR, EST AFRICAN AMERICAN: 27 mL/min — AB (ref >90)
GFR, EST NON AFRICAN AMERICAN: 23 mL/min — AB (ref >90)
Glucose, Bld: 61 mg/dL — ABNORMAL LOW (ref 70–99)
POTASSIUM: 3.6 meq/L — AB (ref 3.7–5.3)
SODIUM: 130 meq/L — AB (ref 137–147)

## 2013-10-15 MED ORDER — DEXTROSE 50 % IV SOLN
25.0000 mL | Freq: Once | INTRAVENOUS | Status: AC | PRN
  Administered 2013-10-15: 25 mL via INTRAVENOUS

## 2013-10-15 MED ORDER — LACTULOSE 10 GM/15ML PO SOLN
20.0000 g | Freq: Two times a day (BID) | ORAL | Status: DC
  Administered 2013-10-15 – 2013-10-21 (×11): 20 g via ORAL
  Filled 2013-10-15 (×14): qty 30

## 2013-10-15 MED ORDER — DEXTROSE 50 % IV SOLN
INTRAVENOUS | Status: AC
  Filled 2013-10-15: qty 50

## 2013-10-15 MED ORDER — INSULIN DETEMIR 100 UNIT/ML ~~LOC~~ SOLN
4.0000 [IU] | Freq: Every day | SUBCUTANEOUS | Status: DC
  Administered 2013-10-15: 4 [IU] via SUBCUTANEOUS
  Filled 2013-10-15 (×2): qty 0.04

## 2013-10-15 NOTE — Progress Notes (Signed)
Hypoglycemic Event  CBG: 40 Treatment: Symptoms: Pale and Vision changes dextrose 50% 1 amp  Follow-up CBG: Time:0820 CBG Result:140 Possible Reasons for Event: Unknown  Comments/MD notified:dr samtani    Tamara Stephenson, Tamara Stephenson  Remember to initiate Hypoglycemia Order Set & complete

## 2013-10-15 NOTE — Progress Notes (Signed)
Note: This document was prepared with digital dictation and possible smart phrase technology. Any transcriptional errors that result from this process are unintentional.   Lenor Provencher ZYS:[id number] DOB: 1933/10/01 DOA: 10/12/2013 PCP: No primary provider on file.  Brief narrative: 80 y/o ? known IDDM Ty 2 since ~2005, Htn, history fatty liver on ursodiol came to the ED with a three-week history of on-and-off fevers.  Ct showed Scoliosis and DDD-also hyponatremia 123, and AKI.  Was started on Rocephin   Past medical history-As per Problem list Chart reviewed as below-   Consultants:    Procedures:    Antibiotics:  Rocephin 10/13/13   Subjective    States " i cannot walk" Sleeping in room yet states severe pain tolerating diet Note hypoglycemia earlier today Therapy eval pending   Objective    Interim History:   Telemetry:    Objective: Filed Vitals:   10/14/13 1033 10/14/13 1336 10/14/13 2100 10/15/13 0532  BP:  137/56 151/72 134/70  Pulse:  70 80 73  Temp:  97.8 F (36.6 C)  97.5 F (36.4 C)  TempSrc:  Oral Oral Oral  Resp:  16 18 18   Height: 5\' 6"  (1.676 m)     Weight:    101.197 kg (223 lb 1.6 oz)  SpO2:  98% 98% 100%    Intake/Output Summary (Last 24 hours) at 10/15/13 1308 Last data filed at 10/15/13 0954  Gross per 24 hour  Intake 1337.5 ml  Output      0 ml  Net 1337.5 ml    Exam:  General: alert obese  Cardiovascular: sq s 2no m.r.g Respiratory: clea rno adde dsoudn Abdomen: soft, NT/ND Skin no LE edema Neuro intact  Data Reviewed: Basic Metabolic Panel:  Recent Labs Lab 10/12/13 0829 10/13/13 0525 10/14/13 0415 10/14/13 1027 10/15/13 0427  NA 123* 128* 130* 131* 130*  K 3.6* 3.7 4.0 3.9 3.6*  CL 90* 98 98 98 99  CO2 18* 16* 15* 18* 18*  GLUCOSE 316* 149* 182* 137* 61*  BUN 47* 36* 39* 42* 48*  CREATININE 1.49* 1.22* 1.35* 1.54* 1.98*  CALCIUM 8.9 8.3* 8.5 8.6 8.7   Liver Function Tests:  Recent Labs Lab  10/12/13 0829 10/13/13 0525  AST 24 25  ALT 21 19  ALKPHOS 176* 156*  BILITOT 2.9* 2.8*  PROT 7.3 6.6  ALBUMIN 2.2* 1.9*   No results found for this basename: LIPASE, AMYLASE,  in the last 168 hours  Recent Labs Lab 10/13/13 1522  AMMONIA 38   CBC:  Recent Labs Lab 10/12/13 0829 10/13/13 0525 10/14/13 0415 10/15/13 0427  WBC 17.6* 16.3* 15.0* 14.3*  NEUTROABS  --   --  12.9* 10.8*  HGB 12.3 11.7* 12.0 11.4*  HCT 33.7* 33.4* 32.7* 32.9*  MCV 92.8 95.7 95.9 96.8  PLT 273 254 294 296   Cardiac Enzymes: No results found for this basename: CKTOTAL, CKMB, CKMBINDEX, TROPONINI,  in the last 168 hours BNP: No components found with this basename: POCBNP,  CBG:  Recent Labs Lab 10/14/13 1654 10/14/13 2126 10/15/13 0758 10/15/13 0820 10/15/13 1150  GLUCAP 84 97 40* 140* 133*    Recent Results (from the past 240 hour(s))  URINE CULTURE     Status: None   Collection Time    10/12/13 10:42 AM      Result Value Ref Range Status   Specimen Description URINE, CATHETERIZED   Final   Special Requests Normal   Final   Culture  Setup Time  Final   Value: 10/12/2013 13:40     Performed at [facility]     Final   Value: >=100,000 COLONIES/ML     Performed at [facility]   Culture     Final   Value: ESCHERICHIA COLI     Performed at [facility]   Report Status 10/14/2013 FINAL   Final   Organism ID, Bacteria ESCHERICHIA COLI   Final     Studies:              All Imaging reviewed and is as per above notation   Scheduled Meds: . buPROPion  300 mg Oral Daily  . dextrose      . famotidine  40 mg Oral Daily  . feeding supplement (GLUCERNA SHAKE)  237 mL Oral BID BM  . gabapentin  300 mg Oral QHS  . heparin  5,000 Units Subcutaneous 3 times per day  . insulin aspart  0-15 Units Subcutaneous TID WC  . insulin detemir  4 Units Subcutaneous QHS  . levofloxacin  500 mg Oral Q48H  . oxyCODONE  5-10 mg Oral Q4H  . ursodiol   300 mg Oral TID   Continuous Infusions: . sodium chloride 100 mL/hr at 10/15/13 0811     Assessment/Plan: 1. E Coli. Pyelonephritis-continue Rocephin-->levaquin 10/15/13. 2. Back Pain-Unlikely any surgical intervention can be done.  Up with therapy 3. 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  No emergent need for surgery 4. IDDM ty 2-with epiosd eof hypoglcemia- levemir 9-4 u, SSI coverage mod but no mealtime coverage 5. Mild confusion-? 2/2 to meds-added back IV opiates 8/10 as pain.  Monitor.  Ammonia 38 6. Fatty liver-stable.  continue Ursodiol 7. AKI-resolving slowly bun/creat 47/1.4->36/1.2-->39/1.5-monitor on lower rate saline 8. Hyponatremia-Correcting on IV saline 50 cc/hr.  Monitor as LE's looking a little swollen.  Posm was low nl, indicating likely tea-toast potomania. lberalized diet.   9. Depression-continue Bupropion 300 daily  Code Status: full Family Communication:  None bedside Disposition Plan: Uw with therapy/nursing   [name], MD  [facility] Pager [phone] 10/15/2013, 1:08 PM    LOS: 3 days

## 2013-10-15 NOTE — Evaluation (Addendum)
Physical Therapy Evaluation Patient Details Name: Tamara Stephenson MRN: [id number] DOB: 1933/05/04 Today's Date: 10/15/2013   History of Present Illness  80 yo female admitted with DM, back pain, inability to walk. Hx of DM, back pain, HTN. [facility] is from [facility].  Clinical Impression  On eval, pt required Max assist +2 for bed mobility, Total assist +2 for attempt at standing. Limited by weakness, pain. Pt stated she had not walked in 5 weeks???-unsure if this is accurate. Recommend SNF for continued rehab.     Follow Up Recommendations SNF    Equipment Recommendations  None recommended by PT    Recommendations for Other Services OT consult     Precautions / Restrictions Precautions Precautions: Fall Restrictions Weight Bearing Restrictions: No      Mobility  Bed Mobility Overal bed mobility: Needs Assistance Bed Mobility: Rolling;Sidelying to Sit;Sit to Sidelying Rolling: Mod assist;+2 for physical assistance;+2 for safety/equipment Sidelying to sit: Max assist;+2 for physical assistance;+2 for safety/equipment;HOB elevated     Sit to sidelying: Max assist;+2 for physical assistance;+2 for safety/equipment General bed mobility comments: Assist for trunk and bil LEs. Increased time. rolling x 4 (2 times to L, 2 times to R) for bed mobility, linen exchange, hygiene.   Transfers Overall transfer level: Needs assistance Equipment used: Rolling walker (2 wheeled) Transfers: Sit to/from Stand Sit to Stand: Total assist;+2 physical assistance;+2 safety/equipment;From elevated surface         General transfer comment: Attempted x1-pt unable to clear bottom from bed surface.   Ambulation/Gait                Stairs            Wheelchair Mobility    Modified Rankin (Stroke Patients Only)       Balance Overall balance assessment: Needs assistance Sitting-balance support: Bilateral upper extremity supported;Feet supported Sitting balance-Leahy Scale:  Poor Sitting balance - Comments: Min assist to stabilize mostly. At times pt was MIn guard assist.                                      Pertinent Vitals/Pain Pain Assessment: 0-10 Pain Score: 6  Pain Location: low back Pain Intervention(s): Repositioned;Premedicated before session;Limited activity within patient's tolerance    Home Living       Type of Home: Apartment Home Access: Level entry     Home Layout: One level Home Equipment: Walker - 2 wheels;Other (comment) Additional Comments: "motorized tryicycle" for long distances    Prior Function Level of Independence: Independent with assistive device(s)         Comments: uses cane in room     Hand Dominance        Extremity/Trunk Assessment   Upper Extremity Assessment: Generalized weakness           Lower Extremity Assessment: RLE deficits/detail;LLE deficits/detail RLE Deficits / Details: knee ext 3/5, DF/PF 2/5, hip flex 2/5-limited by pain LLE Deficits / Details: knee ext 3/5, DF/PF 2/5, hip flex 2/5-limited by pain     Communication   Communication: No difficulties  Cognition Arousal/Alertness: Awake/alert Behavior During Therapy: WFL for tasks assessed/performed Overall Cognitive Status: Within Functional Limits for tasks assessed                      General Comments      Exercises        Assessment/Plan  PT Assessment Patient needs continued PT services  PT Diagnosis Difficulty walking;Generalized weakness;Acute pain   PT Problem List Decreased mobility;Decreased balance;Decreased activity tolerance;Pain;Decreased knowledge of use of DME;Obesity;Decreased range of motion;Decreased strength  PT Treatment Interventions DME instruction;Gait training;Functional mobility training;Therapeutic activities;Patient/family education;Balance training;Therapeutic exercise   PT Goals (Current goals can be found in the Care Plan section) Acute Rehab PT Goals Patient Stated  Goal: less pain. walk.  PT Goal Formulation: With patient Time For Goal Achievement: 10/29/13 Potential to Achieve Goals: Fair    Frequency Min 3X/week   Barriers to discharge        Co-evaluation               End of Session Equipment Utilized During Treatment: Gait belt Activity Tolerance: Patient limited by fatigue;Patient limited by pain Patient left: in bed;with call bell/phone within reach;with bed alarm set           Time: 8676-7209 PT Time Calculation (min): 48 min   Charges:   PT Evaluation $Initial PT Evaluation Tier I: 1 Procedure PT Treatments $Therapeutic Activity: 38-52 mins   PT G Codes:          Weston Anna, MPT Pager: [phone]

## 2013-10-15 NOTE — Progress Notes (Signed)
CSW received reports that pt from [facility].  CSW spoke with [facility] admissions who stated that pt is a resident of [place].  No social work needs identified at this time.  Alison Murray, MSW, [place] Work [phone]

## 2013-10-15 NOTE — Progress Notes (Signed)
Inpatient Diabetes Program Recommendations  AACE/ADA: New Consensus Statement on Inpatient Glycemic Control (2013)  Target Ranges:  Prepandial:   less than 140 mg/dL      Peak postprandial:   less than 180 mg/dL (1-2 hours)      Critically ill patients:  140 - 180 mg/dL   Reason for Visit: Hypoglycemia  Diabetes history: DM2 Outpatient Diabetes medications: Levemir 9 units QHS Current orders for Inpatient glycemic control: Levemir 9 units QHS, Novolog mod tidwc +3 units tidwc  Results for MELONDY, BLANCHARD (MRN [id number]) as of 10/15/2013 10:53  Ref. Range 10/14/2013 12:39 10/14/2013 16:54 10/14/2013 21:26 10/15/2013 07:58 10/15/2013 08:20  Glucose-Capillary Latest Range: 70-99 mg/dL 74 84 97 40 (LL) 140 (H)   Blood sugars low in am.  Inpatient Diabetes Program Recommendations Insulin - Basal: Decrease Levemir to 6 units QHS Correction (SSI): Decreease Novolog to sensitive tidwc Diet: CHO mod med  Note: Will follow. Thank you. Lorenda Peck, RD, LDN, CDE Inpatient Diabetes Coordinator [phone]

## 2013-10-16 ENCOUNTER — Encounter (HOSPITAL_COMMUNITY): Payer: Medicare HMO | Admitting: Anesthesiology

## 2013-10-16 ENCOUNTER — Encounter (HOSPITAL_COMMUNITY): Payer: Self-pay | Admitting: Anesthesiology

## 2013-10-16 ENCOUNTER — Inpatient Hospital Stay (HOSPITAL_COMMUNITY): Payer: Medicare HMO

## 2013-10-16 ENCOUNTER — Inpatient Hospital Stay (HOSPITAL_COMMUNITY): Payer: Medicare HMO | Admitting: Anesthesiology

## 2013-10-16 ENCOUNTER — Encounter (HOSPITAL_COMMUNITY)
Admission: EM | Disposition: A | Discharge status: Skilled Nursing Facility | Payer: Self-pay | Source: Home / Self Care | Attending: Family Medicine

## 2013-10-16 DIAGNOSIS — N179 Acute kidney failure, unspecified: Secondary | ICD-10-CM

## 2013-10-16 DIAGNOSIS — E139 Other specified diabetes mellitus without complications: Secondary | ICD-10-CM

## 2013-10-16 DIAGNOSIS — N390 Urinary tract infection, site not specified: Secondary | ICD-10-CM

## 2013-10-16 HISTORY — PX: LUMBAR LAMINECTOMY FOR EPIDURAL ABSCESS: SHX5956

## 2013-10-16 LAB — BASIC METABOLIC PANEL
Anion gap: 14 (ref 5–15)
BUN: 51 mg/dL — AB (ref 6–23)
CHLORIDE: 101 meq/L (ref 96–112)
CO2: 15 mEq/L — ABNORMAL LOW (ref 19–32)
Calcium: 8.7 mg/dL (ref 8.4–10.5)
Creatinine, Ser: 1.87 mg/dL — ABNORMAL HIGH (ref 0.50–1.10)
GFR calc non Af Amer: 25 mL/min — ABNORMAL LOW (ref >90)
GFR, EST AFRICAN AMERICAN: 29 mL/min — AB (ref >90)
GLUCOSE: 200 mg/dL — AB (ref 70–99)
POTASSIUM: 4.3 meq/L (ref 3.7–5.3)
SODIUM: 130 meq/L — AB (ref 137–147)

## 2013-10-16 LAB — GLUCOSE, CAPILLARY
GLUCOSE-CAPILLARY: 167 mg/dL — AB (ref 70–99)
GLUCOSE-CAPILLARY: 183 mg/dL — AB (ref 70–99)
GLUCOSE-CAPILLARY: 194 mg/dL — AB (ref 70–99)
Glucose-Capillary: 178 mg/dL — ABNORMAL HIGH (ref 70–99)

## 2013-10-16 LAB — SURGICAL PCR SCREEN
MRSA, PCR: NEGATIVE
STAPHYLOCOCCUS AUREUS: NEGATIVE

## 2013-10-16 LAB — TSH: TSH: 1.24 u[IU]/mL (ref 0.350–4.500)

## 2013-10-16 SURGERY — LUMBAR LAMINECTOMY FOR EPIDURAL ABSCESS
Anesthesia: General | Site: Back

## 2013-10-16 SURGERY — LUMBAR LAMINECTOMY FOR EPIDURAL ABSCESS
Anesthesia: General | Laterality: Bilateral

## 2013-10-16 MED ORDER — SODIUM BICARBONATE 650 MG PO TABS
650.0000 mg | ORAL_TABLET | Freq: Two times a day (BID) | ORAL | Status: DC
  Administered 2013-10-16 – 2013-10-21 (×10): 650 mg via ORAL
  Filled 2013-10-16 (×12): qty 1

## 2013-10-16 MED ORDER — OXYCODONE HCL 5 MG PO TABS
5.0000 mg | ORAL_TABLET | ORAL | Status: DC | PRN
  Administered 2013-10-16 – 2013-10-21 (×4): 10 mg via ORAL
  Filled 2013-10-16 (×4): qty 2

## 2013-10-16 MED ORDER — INSULIN ASPART 100 UNIT/ML ~~LOC~~ SOLN: 3.0000 [IU] | Freq: Three times a day (TID) | SUBCUTANEOUS | Status: DC

## 2013-10-16 MED ORDER — ONDANSETRON HCL 4 MG/2ML IJ SOLN: 4.0000 mg | Freq: Four times a day (QID) | INTRAMUSCULAR | Status: DC | PRN

## 2013-10-16 MED ORDER — INSULIN GLARGINE 100 UNIT/ML ~~LOC~~ SOLN: 10.0000 [IU] | Freq: Every day | SUBCUTANEOUS | Status: DC

## 2013-10-16 MED ORDER — DEXTROSE 5 % IV SOLN
INTRAVENOUS | Status: DC | PRN
  Administered 2013-10-16: 21:00:00 via INTRAVENOUS

## 2013-10-16 MED ORDER — MIDAZOLAM HCL 5 MG/5ML IJ SOLN
INTRAMUSCULAR | Status: DC | PRN
Start: 2013-10-16 — End: 2013-10-16
  Administered 2013-10-16: 2 mg via INTRAVENOUS

## 2013-10-16 MED ORDER — GLYCOPYRROLATE 0.2 MG/ML IJ SOLN
INTRAMUSCULAR | Status: DC | PRN
  Administered 2013-10-16: 0.6 mg via INTRAVENOUS

## 2013-10-16 MED ORDER — FLUMAZENIL 0.5 MG/5ML IV SOLN
INTRAVENOUS | Status: AC
  Filled 2013-10-16: qty 5

## 2013-10-16 MED ORDER — OXYCODONE HCL 5 MG PO TABS
ORAL_TABLET | ORAL | Status: AC
  Filled 2013-10-16: qty 1

## 2013-10-16 MED ORDER — SUCCINYLCHOLINE CHLORIDE 20 MG/ML IJ SOLN
INTRAMUSCULAR | Status: DC | PRN
  Administered 2013-10-16: 100 mg via INTRAVENOUS

## 2013-10-16 MED ORDER — OXYCODONE HCL 5 MG PO TABS: 5.0000 mg | ORAL_TABLET | Freq: Once | ORAL | Status: AC | PRN

## 2013-10-16 MED ORDER — HYDROMORPHONE HCL PF 1 MG/ML IJ SOLN
INTRAMUSCULAR | Status: AC
  Filled 2013-10-16: qty 1

## 2013-10-16 MED ORDER — INSULIN GLARGINE 100 UNIT/ML ~~LOC~~ SOLN: 15.0000 [IU] | Freq: Every day | SUBCUTANEOUS | Status: DC

## 2013-10-16 MED ORDER — DEXTROSE 5 % IV SOLN
1.0000 g | INTRAVENOUS | Status: AC
  Administered 2013-10-16: 1 g via INTRAVENOUS
  Filled 2013-10-16: qty 10

## 2013-10-16 MED ORDER — ARTIFICIAL TEARS OP OINT
TOPICAL_OINTMENT | OPHTHALMIC | Status: DC | PRN
  Administered 2013-10-16: 1 via OPHTHALMIC

## 2013-10-16 MED ORDER — POLYETHYLENE GLYCOL 3350 17 G PO PACK
17.0000 g | PACK | Freq: Every day | ORAL | Status: DC
  Administered 2013-10-16 – 2013-10-20 (×4): 17 g via ORAL
  Filled 2013-10-16 (×6): qty 1

## 2013-10-16 MED ORDER — HYDROMORPHONE HCL PF 1 MG/ML IJ SOLN: 0.2500 mg | INTRAMUSCULAR | Status: DC | PRN

## 2013-10-16 MED ORDER — FENTANYL CITRATE 0.05 MG/ML IJ SOLN
INTRAMUSCULAR | Status: DC | PRN
  Administered 2013-10-16: 50 ug via INTRAVENOUS
  Administered 2013-10-16: 100 ug via INTRAVENOUS
  Administered 2013-10-16: 50 ug via INTRAVENOUS
  Administered 2013-10-16: 100 ug via INTRAVENOUS

## 2013-10-16 MED ORDER — OXYCODONE HCL 5 MG/5ML PO SOLN: 5.0000 mg | Freq: Once | ORAL | Status: AC | PRN

## 2013-10-16 MED ORDER — VANCOMYCIN HCL 1000 MG IV SOLR
INTRAVENOUS | Status: DC | PRN
  Administered 2013-10-16: 1000 mg

## 2013-10-16 MED ORDER — SODIUM CHLORIDE 0.9 % IR SOLN
Status: DC | PRN
  Administered 2013-10-16: 20:00:00

## 2013-10-16 MED ORDER — PROPOFOL 10 MG/ML IV BOLUS
INTRAVENOUS | Status: DC | PRN
  Administered 2013-10-16: 150 mg via INTRAVENOUS

## 2013-10-16 MED ORDER — THROMBIN 20000 UNITS EX SOLR
CUTANEOUS | Status: DC | PRN
  Administered 2013-10-16: 20:00:00 via TOPICAL

## 2013-10-16 MED ORDER — 0.9 % SODIUM CHLORIDE (POUR BTL) OPTIME
TOPICAL | Status: DC | PRN
  Administered 2013-10-16: 1000 mL

## 2013-10-16 MED ORDER — VANCOMYCIN HCL 1000 MG IV SOLR
INTRAVENOUS | Status: AC
  Filled 2013-10-16: qty 1000

## 2013-10-16 MED ORDER — ONDANSETRON HCL 4 MG/2ML IJ SOLN
INTRAMUSCULAR | Status: DC | PRN
  Administered 2013-10-16: 4 mg via INTRAVENOUS

## 2013-10-16 MED ORDER — SODIUM CHLORIDE 0.9 % IV SOLN
INTRAVENOUS | Status: DC | PRN
  Administered 2013-10-16 (×2): via INTRAVENOUS

## 2013-10-16 MED ORDER — INSULIN DETEMIR 100 UNIT/ML ~~LOC~~ SOLN: 5.0000 [IU] | Freq: Every day | SUBCUTANEOUS | Status: DC

## 2013-10-16 MED ORDER — VECURONIUM BROMIDE 10 MG IV SOLR
INTRAVENOUS | Status: DC | PRN
  Administered 2013-10-16: 5 mg via INTRAVENOUS

## 2013-10-16 MED ORDER — VANCOMYCIN HCL IN DEXTROSE 1-5 GM/200ML-% IV SOLN
INTRAVENOUS | Status: AC
  Administered 2013-10-16: 1000 mg via INTRAVENOUS
  Filled 2013-10-16: qty 200

## 2013-10-16 MED ORDER — LIDOCAINE HCL (CARDIAC) 20 MG/ML IV SOLN
INTRAVENOUS | Status: DC | PRN
  Administered 2013-10-16: 70 mg via INTRAVENOUS

## 2013-10-16 MED ORDER — DEXAMETHASONE SODIUM PHOSPHATE 4 MG/ML IJ SOLN
INTRAMUSCULAR | Status: DC | PRN
Start: 2013-10-16 — End: 2013-10-16
  Administered 2013-10-16: 4 mg via INTRAVENOUS

## 2013-10-16 MED ORDER — INSULIN DETEMIR 100 UNIT/ML ~~LOC~~ SOLN: 4.0000 [IU] | Freq: Every day | SUBCUTANEOUS | Status: DC

## 2013-10-16 MED ORDER — NALOXONE HCL 0.4 MG/ML IJ SOLN
INTRAMUSCULAR | Status: DC | PRN
  Administered 2013-10-16 (×2): 50 ug via INTRAVENOUS

## 2013-10-16 MED ORDER — NEOSTIGMINE METHYLSULFATE 10 MG/10ML IV SOLN
INTRAVENOUS | Status: DC | PRN
  Administered 2013-10-16: 5 mg via INTRAVENOUS

## 2013-10-16 MED ORDER — INSULIN DETEMIR 100 UNIT/ML ~~LOC~~ SOLN
8.0000 [IU] | Freq: Every day | SUBCUTANEOUS | Status: DC
  Filled 2013-10-16 (×2): qty 0.08

## 2013-10-16 SURGICAL SUPPLY — 56 items
BAG DECANTER FOR FLEXI CONT (MISCELLANEOUS) ×3 IMPLANT
BENZOIN TINCTURE PRP APPL 2/3 (GAUZE/BANDAGES/DRESSINGS) ×3 IMPLANT
BLADE SURG ROTATE 9660 (MISCELLANEOUS) IMPLANT
BRUSH SCRUB EZ PLAIN DRY (MISCELLANEOUS) ×3 IMPLANT
BUR CUTTER 7.0 ROUND (BURR) ×3 IMPLANT
CANISTER SUCT 3000ML (MISCELLANEOUS) ×3 IMPLANT
CLOSURE WOUND 1/2 X4 (GAUZE/BANDAGES/DRESSINGS) ×1
CONT SPEC 4OZ CLIKSEAL STRL BL (MISCELLANEOUS) ×3 IMPLANT
DECANTER SPIKE VIAL GLASS SM (MISCELLANEOUS) ×3 IMPLANT
DERMABOND ADVANCED (GAUZE/BANDAGES/DRESSINGS) ×2
DERMABOND ADVANCED .7 DNX12 (GAUZE/BANDAGES/DRESSINGS) ×1 IMPLANT
DRAPE LAPAROTOMY 100X72X124 (DRAPES) ×3 IMPLANT
DRAPE MICROSCOPE LEICA (MISCELLANEOUS) ×3 IMPLANT
DRAPE POUCH INSTRU U-SHP 10X18 (DRAPES) ×3 IMPLANT
DRAPE PROXIMA HALF (DRAPES) IMPLANT
DRAPE SURG 17X23 STRL (DRAPES) ×6 IMPLANT
DRSG OPSITE POSTOP 4X8 (GAUZE/BANDAGES/DRESSINGS) ×3 IMPLANT
DURAPREP 26ML APPLICATOR (WOUND CARE) ×3 IMPLANT
ELECT REM PT RETURN 9FT ADLT (ELECTROSURGICAL) ×3
ELECTRODE REM PT RTRN 9FT ADLT (ELECTROSURGICAL) ×1 IMPLANT
GAUZE SPONGE 4X4 12PLY STRL (GAUZE/BANDAGES/DRESSINGS) ×3 IMPLANT
GAUZE SPONGE 4X4 16PLY XRAY LF (GAUZE/BANDAGES/DRESSINGS) IMPLANT
GLOVE BIO SURGEON STRL SZ 6.5 (GLOVE) ×4 IMPLANT
GLOVE BIO SURGEON STRL SZ7 (GLOVE) ×3 IMPLANT
GLOVE BIO SURGEONS STRL SZ 6.5 (GLOVE) ×2
GLOVE ECLIPSE 9.0 STRL (GLOVE) ×3 IMPLANT
GLOVE EXAM NITRILE LRG STRL (GLOVE) IMPLANT
GLOVE EXAM NITRILE MD LF STRL (GLOVE) IMPLANT
GLOVE EXAM NITRILE XL STR (GLOVE) IMPLANT
GLOVE EXAM NITRILE XS STR PU (GLOVE) IMPLANT
GLOVE INDICATOR 6.5 STRL GRN (GLOVE) ×6 IMPLANT
GOWN STRL REUS W/ TWL LRG LVL3 (GOWN DISPOSABLE) ×2 IMPLANT
GOWN STRL REUS W/ TWL XL LVL3 (GOWN DISPOSABLE) ×1 IMPLANT
GOWN STRL REUS W/TWL 2XL LVL3 (GOWN DISPOSABLE) IMPLANT
GOWN STRL REUS W/TWL LRG LVL3 (GOWN DISPOSABLE) ×4
GOWN STRL REUS W/TWL XL LVL3 (GOWN DISPOSABLE) ×2
KIT BASIN OR (CUSTOM PROCEDURE TRAY) ×3 IMPLANT
KIT ROOM TURNOVER OR (KITS) ×3 IMPLANT
NEEDLE HYPO 22GX1.5 SAFETY (NEEDLE) ×3 IMPLANT
NEEDLE SPNL 22GX3.5 QUINCKE BK (NEEDLE) ×3 IMPLANT
NS IRRIG 1000ML POUR BTL (IV SOLUTION) ×3 IMPLANT
PACK LAMINECTOMY NEURO (CUSTOM PROCEDURE TRAY) ×3 IMPLANT
PAD ARMBOARD 7.5X6 YLW CONV (MISCELLANEOUS) ×9 IMPLANT
RUBBERBAND STERILE (MISCELLANEOUS) ×6 IMPLANT
SPONGE SURGIFOAM ABS GEL SZ50 (HEMOSTASIS) ×3 IMPLANT
STRIP CLOSURE SKIN 1/2X4 (GAUZE/BANDAGES/DRESSINGS) ×2 IMPLANT
SUT VIC AB 2-0 CT1 18 (SUTURE) ×9 IMPLANT
SUT VIC AB 3-0 SH 8-18 (SUTURE) ×3 IMPLANT
SWAB COLLECTION DEVICE MRSA (MISCELLANEOUS) IMPLANT
SWAB CULTURE LIQ STUART DBL (MISCELLANEOUS) ×6 IMPLANT
SYR 20ML ECCENTRIC (SYRINGE) ×3 IMPLANT
TAPE STRIPS DRAPE STRL (GAUZE/BANDAGES/DRESSINGS) ×3 IMPLANT
TOWEL OR 17X24 6PK STRL BLUE (TOWEL DISPOSABLE) ×3 IMPLANT
TOWEL OR 17X26 10 PK STRL BLUE (TOWEL DISPOSABLE) ×3 IMPLANT
TUBE ANAEROBIC SPECIMEN COL (MISCELLANEOUS) IMPLANT
WATER STERILE IRR 1000ML POUR (IV SOLUTION) ×3 IMPLANT

## 2013-10-16 NOTE — Progress Notes (Signed)
CSW received referral for New SNF placement.  CSW attempted to meet with pt in pt room, but pt in MRI at this time.  CSW to follow up at a later time and complete full psychosocial assessment at that time.  Alison Murray, MSW, [place] Work [phone]

## 2013-10-16 NOTE — Progress Notes (Signed)
Was notified of severe L3-L4  spinal stenosis on MRI with epidural fluid collection and b/l  Hydronephrosis secondary to cauda equina as per radiologist. consulted neurosurgery  Dr Annette Stable who will see the pt this evening to decide on decompressive surgery. Will keep her NPO and place foley.

## 2013-10-16 NOTE — Brief Op Note (Signed)
10/12/2013 - 10/16/2013  9:30 PM  PATIENT:  Tamara Stephenson  80 y.o. female  PRE-OPERATIVE DIAGNOSIS:  Lumbar Epidural Abscess  POST-OPERATIVE DIAGNOSIS:  Lumbar Epidural Abscess  PROCEDURE:  Procedure(s): LUMBAR LAMINECTOMY FOR EPIDURAL ABSCESS Lumbar Two through Four (N/A)  SURGEON:  Surgeon(s) and Role:    * Charlie Pitter, MD - Primary  PHYSICIAN ASSISTANT:   ASSISTANTS:    ANESTHESIA:   general  EBL:  Total I/O In: 1250 [I.V.:1250] Out: 400 [Urine:400]  BLOOD ADMINISTERED:none  DRAINS: (Med) Hemovact drain(s) in the Epidural space with  Suction Open   LOCAL MEDICATIONS USED:  NONE  SPECIMEN:  No Specimen  DISPOSITION OF SPECIMEN:  N/A  COUNTS:  YES  TOURNIQUET:  * No tourniquets in log *  DICTATION: .Dragon Dictation  PLAN OF CARE: Admit to inpatient   PATIENT DISPOSITION:  PACU - hemodynamically stable.   Delay start of Pharmacological VTE agent (>24hrs) due to surgical blood loss or risk of bleeding: yes

## 2013-10-16 NOTE — Consult Note (Signed)
Reason for Consult: Cauda equina syndrome Referring Physician: Hospitalist  Tamara Stephenson is an 80 y.o. female.  HPI: 80 year old female with a one-week history of back pain and lower extremity weakness. Patient states that she's been essentially nonambulatory for at least 5 days. Patient complains of weakness and numbness in both lower extremities. Patient with urinary retention and some incontinence. Workup demonstrates evidence of degenerative scoliosis with very severe stenosis from W0-J8 complicated by a dorsal epidural fluid collection worrisome for epidural abscess. Patient has been running fevers. Probable UTI.  Past Medical History  Diagnosis Date  . Back pain   . Hypertension   . Carotid stenosis   . Colitis   . Diabetes mellitus without complication     Past Surgical History  Procedure Laterality Date  . Carotid endarterectomy      Family History  Problem Relation Age of Onset  . Leukemia Mother   . Jaundice Father     Social History:  reports that she has been smoking.  She does not have any smokeless tobacco history on file. She reports that she does not drink alcohol. Her drug history is not on file.  Allergies:  Allergies  Allergen Reactions  . Peanut-Containing Drug Products Nausea And Vomiting    Medications: I have reviewed the patient's current medications.  Results for orders placed during the hospital encounter of 10/12/13 (from the past 48 hour(s))  GLUCOSE, CAPILLARY     Status: None   Collection Time    10/14/13  9:26 PM      Result Value Ref Range   Glucose-Capillary 97  70 - 99 mg/dL   Comment 1 Notify RN    BASIC METABOLIC PANEL     Status: Abnormal   Collection Time    10/15/13  4:27 AM      Result Value Ref Range   Sodium 130 (*) 137 - 147 mEq/L   Potassium 3.6 (*) 3.7 - 5.3 mEq/L   Chloride 99  96 - 112 mEq/L   CO2 18 (*) 19 - 32 mEq/L   Glucose, Bld 61 (*) 70 - 99 mg/dL   BUN 48 (*) 6 - 23 mg/dL   Creatinine, Ser 1.98 (*) 0.50 - 1.10  mg/dL   Calcium 8.7  8.4 - 10.5 mg/dL   GFR calc non Af Amer 23 (*) >90 mL/min   GFR calc Af Amer 27 (*) >90 mL/min   Comment: (NOTE)     The eGFR has been calculated using the CKD EPI equation.     This calculation has not been validated in all clinical situations.     eGFR's persistently <90 mL/min signify possible Chronic Kidney     Disease.   Anion gap 13  5 - 15  CBC WITH DIFFERENTIAL     Status: Abnormal   Collection Time    10/15/13  4:27 AM      Result Value Ref Range   WBC 14.3 (*) 4.0 - 10.5 K/uL   RBC 3.40 (*) 3.87 - 5.11 MIL/uL   Hemoglobin 11.4 (*) 12.0 - 15.0 g/dL   HCT 32.9 (*) 36.0 - 46.0 %   MCV 96.8  78.0 - 100.0 fL   MCH 33.5  26.0 - 34.0 pg   MCHC 34.7  30.0 - 36.0 g/dL   RDW 14.6  11.5 - 15.5 %   Platelets 296  150 - 400 K/uL   Neutrophils Relative % 76  43 - 77 %   Neutro Abs 10.8 (*) 1.7 -  7.7 K/uL   Lymphocytes Relative 15  12 - 46 %   Lymphs Abs 2.1  0.7 - 4.0 K/uL   Monocytes Relative 9  3 - 12 %   Monocytes Absolute 1.3 (*) 0.1 - 1.0 K/uL   Eosinophils Relative 0  0 - 5 %   Eosinophils Absolute 0.1  0.0 - 0.7 K/uL   Basophils Relative 0  0 - 1 %   Basophils Absolute 0.0  0.0 - 0.1 K/uL  GLUCOSE, CAPILLARY     Status: Abnormal   Collection Time    10/15/13  7:58 AM      Result Value Ref Range   Glucose-Capillary 40 (*) 70 - 99 mg/dL   Comment 1 Documented in Chart     Comment 2 Notify RN    GLUCOSE, CAPILLARY     Status: Abnormal   Collection Time    10/15/13  8:20 AM      Result Value Ref Range   Glucose-Capillary 140 (*) 70 - 99 mg/dL   Comment 1 Documented in Chart     Comment 2 Notify RN    GLUCOSE, CAPILLARY     Status: Abnormal   Collection Time    10/15/13 11:50 AM      Result Value Ref Range   Glucose-Capillary 133 (*) 70 - 99 mg/dL   Comment 1 Documented in Chart     Comment 2 Notify RN    GLUCOSE, CAPILLARY     Status: None   Collection Time    10/15/13  5:05 PM      Result Value Ref Range   Glucose-Capillary 99  70 - 99  mg/dL   Comment 1 Documented in Chart     Comment 2 Notify RN    GLUCOSE, CAPILLARY     Status: Abnormal   Collection Time    10/15/13  8:45 PM      Result Value Ref Range   Glucose-Capillary 167 (*) 70 - 99 mg/dL   Comment 1 Notify RN    BASIC METABOLIC PANEL     Status: Abnormal   Collection Time    10/16/13  4:12 AM      Result Value Ref Range   Sodium 130 (*) 137 - 147 mEq/L   Potassium 4.3  3.7 - 5.3 mEq/L   Chloride 101  96 - 112 mEq/L   CO2 15 (*) 19 - 32 mEq/L   Glucose, Bld 200 (*) 70 - 99 mg/dL   BUN 51 (*) 6 - 23 mg/dL   Creatinine, Ser 1.87 (*) 0.50 - 1.10 mg/dL   Calcium 8.7  8.4 - 10.5 mg/dL   GFR calc non Af Amer 25 (*) >90 mL/min   GFR calc Af Amer 29 (*) >90 mL/min   Comment: (NOTE)     The eGFR has been calculated using the CKD EPI equation.     This calculation has not been validated in all clinical situations.     eGFR's persistently <90 mL/min signify possible Chronic Kidney     Disease.   Anion gap 14  5 - 15  GLUCOSE, CAPILLARY     Status: Abnormal   Collection Time    10/16/13  7:39 AM      Result Value Ref Range   Glucose-Capillary 178 (*) 70 - 99 mg/dL   Comment 1 Documented in Chart     Comment 2 Notify RN    GLUCOSE, CAPILLARY     Status: Abnormal   Collection Time  10/16/13 12:09 PM      Result Value Ref Range   Glucose-Capillary 167 (*) 70 - 99 mg/dL   Comment 1 Documented in Chart     Comment 2 Notify RN    GLUCOSE, CAPILLARY     Status: Abnormal   Collection Time    10/16/13  5:49 PM      Result Value Ref Range   Glucose-Capillary 183 (*) 70 - 99 mg/dL   Comment 1 Documented in Chart     Comment 2 Notify RN      Mr Lumbar Spine Wo Contrast  10/16/2013   CLINICAL DATA:  Acute on chronic low back pain.  EXAM: MRI LUMBAR SPINE WITHOUT CONTRAST  TECHNIQUE: Multiplanar, multisequence MR imaging of the lumbar spine was performed. No intravenous contrast was administered.  COMPARISON:  CT abdomen and pelvis 10/12/2013  FINDINGS: For the  purposes of this dictation, the lowest well-formed intervertebral disc space is presumed to be L5-S1.  Prominent S-shaped thoracolumbar scoliosis is present, convex to the left in the lumbar spine. No significant listhesis is seen. Moderate to severe disc space narrowing is seen throughout the lumbar spine, with relative preservation of the L3-4 disc space. Diffuse degenerative marrow changes are present. No vertebral marrow edema is seen.  There is a dorsal epidural gas and fluid collection extending from L2 to the superior aspect of L4 predominantly in the midline and left of midline. The collection is predominantly fluid, with a small amount of gas superiorly at the L2 level. The amount of gas in the collection has greatly decreased from the recent abdominal CT, however the gas appears to have been replaced by fluid. There is mass effect on the thecal sac, greatest at L2-3 where there is severe spinal stenosis. At the L2 level, the collection measures 13 x 11 mm (transverse by AP). The collection measures 6-6.5 cm in craniocaudal length. A fluid collection posterior to the left L4-5 facet joint measures 11 x 13 x 26 mm. There is also a 10 x 4 mm fluid collection medial to the left L4-5 facet in the epidural space. There is edema in the left-sided posterior paraspinal soft tissues of the mid to lower lumbar spine, greatest about the left L4-5 facet joint. There is also moderate marrow edema about the left L4-5 facet joint.  The tip of the conus medullaris is not clearly localized but is likely near the L1-2 disc space level. There is marked distention of the bladder, incompletely visualized. There is moderate bilateral hydroureteronephrosis.  T11-12: Disc bulge, osteophytic ridging, and moderate facet hypertrophy result in mild to moderate spinal stenosis and mild left neural foraminal stenosis.  T12-L1: Circumferential disc bulge, osteophytic ridging, and moderate facet hypertrophy result in moderate spinal  stenosis, left greater than right lateral recess stenosis, and mild left neural foraminal stenosis.  L1-2: Circumferential disc bulge, osteophytic ridging, and moderate to severe facet hypertrophy result in mild spinal stenosis and mild right neural foraminal stenosis.  L2-3: Severe spinal stenosis predominantly due to the dorsal epidural fluid collection. There is disc bulging and osteophytic ridging asymmetric to the right along with moderate to severe right greater than left facet hypertrophy, resulting in moderate right neural foraminal stenosis.  L3-4: Severe spinal stenosis due to the dorsal epidural fluid collection, mild disc bulging, and severe facet hypertrophy. Mild right neural foraminal stenosis.  L4-5: Mild spinal stenosis due to osteophytic ridging, left lateral epidural fluid collection/synovial cyst, and moderate facet hypertrophy. No significant neural foraminal stenosis.  L5-S1: Left greater than right lateral recess stenosis due to disc bulging, osteophytic ridging, and moderate to severe facet hypertrophy. Mild right and moderate left neural foraminal stenosis.  IMPRESSION: 1. Epidural gas and fluid collection extending from L2 -L4. This contributes to severe spinal stenosis at L2-3 and L3-4 with complete effacement of the thecal sac. The amount of gas has decreased from the prior CT but the amount of fluid appears to have increased. This is concerning for epidural abscess. 2. Small fluid collections adjacent to the left L4-5 facet joint with surrounding marrow and soft tissue edema. These may reflect acute on chronic facet arthritis and synovial cysts, however septic facet arthritis with associated abscesses is also of concern, particularly given the epidural collection. 3. Marked distention of the bladder with bilateral hydroureteronephrosis. This could potentially reflect urinary retention due to mass effect on the cauda equina. 4. Thoracolumbar scoliosis with diffuse, severe disc  degeneration and facet arthrosis resulting in spinal canal and neural foraminal stenosis as above. These results were called by telephone at the time of interpretation on 10/16/2013 at 5:00 pm to Dr. Louellen Molder , who verbally acknowledged these results.   Electronically Signed   By: Logan Bores   On: 10/16/2013 17:04    Pertinent items are noted in HPI. Blood pressure 141/73, pulse 83, temperature 97.3 F (36.3 C), temperature source Oral, resp. rate 16, height 5' 6"  (1.676 m), weight 101.197 kg (223 lb 1.6 oz), SpO2 95.00%. Patient is awake and aware. She is somewhat confused. She answers questions appropriately however. She appears to understand her situation. Examination head ears eyes and further marker examined for chest and abdomen revealed a distended nontender abdomen with hypoactive bowel sounds. Examination extremities are free from injury or deformity although she does have bilateral lower extremity edema. It is neurologically her motor examination her upper trimming his is normal. Lower extremity motor function reveals 2/5 strength in hip flexors knee extensors knee flexors and plantar flexors. She has 0/5 strength in both dorsiflexors. Relative sensory level at L2 bilaterally. Absent reflexes both lower extremity is.  Assessment/Plan: Cauda equina syndrome secondary to severe lumbar stenosis with probable coexistent lumbar dorsal epidural abscess. I've recommended patient move forward with emergent decompressive surgery in hopes of improving her symptoms. I discussed the risks and benefits involved with L2-L4 decompressive laminectomy and evacuation of abscess. I discussed risks of surgery including but not limited to the risk of anesthesia, bleeding, infection, CSF leak, nerve root injury, later instability, continued pain, and nondistended. Patient aware the risks and wishes to proceed. She will be transferred immediately to [facility] 4 emergent decompressive surgery.  Rabab Currington  A 10/16/2013, 6:23 PM

## 2013-10-16 NOTE — Progress Notes (Addendum)
[facility] PROGRESS NOTE  Tamara Stephenson WNU:[id number] DOB: [birth year] DOA: 10/12/2013 PCP: Curly Rim, MD Brief narrative 80 year old female with history of type 2 diabetes mellitus, hypertension, fatty liver, presented with complaints of progressive low back pain and lower extremity weakness with off-and-on fevers at home. Patient found to have UTI with acute kidney injury along with hyponatremia. CT of the abdomen and pelvis showed scoliosis with degenerative disc disease. Patient admitted for UTI and acute kidney injury and started on empiric Rocephin.   Assessment/Plan: Escherichia coli UTI Continue empiric Levaquin. Denies any dysuria or flank pain.  Progressive chronic low back pain Patient reports progressive back pain with lower extremity weakness limiting her mobility at home for possible weeks. She has low back tenderness on exam with findings of scoliosis and degenerative disc disease on CT scan of the abdomen and pelvis. We'll obtain lumbar spine MRI without contrast. No focal neurological deficit noted on exam. Seen by physical therapy and recommends skilled nursing facility. -  Type 2 diabetes mellitus Noted with episode of hypoglycemia on 8/11. Place on sliding scale insulin and levemir 4u at bedtime. (Patient on 9 units at home). FSG in 200s today. We increased Levemir to 8 units at bedtime  Acute vs acute on  kidney injury Possibly in the setting of UTI, versus chronic kidney disease secondary to diabetic nephropathy. patient was taking regular NSAIDs at home. Will discontinue NSAIDs. Monitor with hydration. No previous renal function in the system.  Bilateral leg edema and elevated proBNP Patient does report getting short of breath with minimal ambulation. Will obtain 2-D echo to evaluate LV function.  Anemia Appears to be chronic and patient on iron supplements at home. Will check TSH and B12 level.  hyponatremia Supposedly hypovolmic. Improving  Low  bicarbonate Replenished with sodium bicarbonate tablets.  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 Asymptomatic. Given uncontrolled DM needs outpt surgical evaluation.   DVT prophylaxis: Subcutaneous heparin  Diet: Diabetic  Code Status: Full code Family Communication: None. Denies having any family Disposition Plan: Skilled nursing facility as per PT.   Consultants:  None  Procedures:  None  Antibiotics:  None  HPI/Subjective: Patient seen and examined this morning. Reports low back pain ongoing for several weeks and progressively worsening.  reports being constipated for several days.  Objective: Filed Vitals:   10/16/13 0531  BP: 141/73  Pulse: 83  Temp: 97.3 F (36.3 C)  Resp: 16    Intake/Output Summary (Last 24 hours) at 10/16/13 1514 Last data filed at 10/16/13 0532  Gross per 24 hour  Intake 2170.84 ml  Output      0 ml  Net 2170.84 ml   Filed Weights   10/13/13 0635 10/14/13 0551 10/15/13 0532  Weight: 96.163 kg (212 lb) 96.389 kg (212 lb 8 oz) 101.197 kg (223 lb 1.6 oz)    Exam:   General:  Elderly female in no acute distress  HEENT: No pallor, moist oral mucosa  Chest: Clear to auscultation bilaterally, no added sound  CVS: Normal S1-S2, no murmurs or gallop  Abdomen: Soft, nontender, nondistended, bowel sounds present  extremities: 1+ pitting edema bilaterally, warm, low back tenderness, limited strength in bilateral lower extremity due to pain  CNS: Alert and oriented.  Data Reviewed: Basic Metabolic Panel:  Recent Labs Lab 10/13/13 0525 10/14/13 0415 10/14/13 1027 10/15/13 0427 10/16/13 0412  NA 128* 130* 131* 130* 130*  K 3.7 4.0 3.9 3.6* 4.3  CL 98 98 98 99 101  CO2 16* 15*  18* 18* 15*  GLUCOSE 149* 182* 137* 61* 200*  BUN 36* 39* 42* 48* 51*  CREATININE 1.22* 1.35* 1.54* 1.98* 1.87*  CALCIUM 8.3* 8.5 8.6 8.7 8.7   Liver Function Tests:  Recent Labs Lab 10/12/13 0829 10/13/13 0525  AST 24 25  ALT 21 19   ALKPHOS 176* 156*  BILITOT 2.9* 2.8*  PROT 7.3 6.6  ALBUMIN 2.2* 1.9*   No results found for this basename: LIPASE, AMYLASE,  in the last 168 hours  Recent Labs Lab 10/13/13 1522  AMMONIA 38   CBC:  Recent Labs Lab 10/12/13 0829 10/13/13 0525 10/14/13 0415 10/15/13 0427  WBC 17.6* 16.3* 15.0* 14.3*  NEUTROABS  --   --  12.9* 10.8*  HGB 12.3 11.7* 12.0 11.4*  HCT 33.7* 33.4* 32.7* 32.9*  MCV 92.8 95.7 95.9 96.8  PLT 273 254 294 296   Cardiac Enzymes: No results found for this basename: CKTOTAL, CKMB, CKMBINDEX, TROPONINI,  in the last 168 hours BNP (last 3 results)  Recent Labs  10/12/13 1251  PROBNP 1099.0*   CBG:  Recent Labs Lab 10/15/13 1150 10/15/13 1705 10/15/13 2045 10/16/13 0739 10/16/13 1209  GLUCAP 133* 99 167* 178* 167*    Recent Results (from the past 240 hour(s))  URINE CULTURE     Status: None   Collection Time    10/12/13 10:42 AM      Result Value Ref Range Status   Specimen Description URINE, CATHETERIZED   Final   Special Requests Normal   Final   Culture  Setup Time     Final   Value: 10/12/2013 13:40     Performed at [facility]     Final   Value: >=100,000 COLONIES/ML     Performed at [facility]   Culture     Final   Value: ESCHERICHIA COLI     Performed at [facility]   Report Status 10/14/2013 FINAL   Final   Organism ID, Bacteria ESCHERICHIA COLI   Final     Studies: No results found.  Scheduled Meds: . buPROPion  300 mg Oral Daily  . famotidine  40 mg Oral Daily  . feeding supplement (GLUCERNA SHAKE)  237 mL Oral BID BM  . gabapentin  300 mg Oral QHS  . heparin  5,000 Units Subcutaneous 3 times per day  . insulin aspart  0-15 Units Subcutaneous TID WC  . insulin detemir  8 Units Subcutaneous QHS  . lactulose  20 g Oral BID  . levofloxacin  500 mg Oral Q48H  . polyethylene glycol  17 g Oral Daily  . sodium bicarbonate  650 mg Oral BID  . ursodiol  300 mg Oral TID    Continuous Infusions:     Time spent: 25 minutes    Tamara Stephenson, [place]  [facility] Pager [phone]. If 7PM-7AM, please contact night-coverage at [email or web address], password [facility] 10/16/2013, 3:14 PM  LOS: 4 days

## 2013-10-16 NOTE — Transfer of Care (Signed)
Immediate Anesthesia Transfer of Care Note  Patient: Tamara Stephenson  Procedure(s) Performed: Procedure(s): LUMBAR LAMINECTOMY FOR EPIDURAL ABSCESS Lumbar Two through Four (N/A)  Patient Location: PACU  Anesthesia Type:General  Level of Consciousness: sedated, lethargic and responds to stimulation  Airway & Oxygen Therapy: Patient remains intubated per anesthesia plan and Patient placed on Ventilator (see vital sign flow sheet for setting)  Post-op Assessment: Report given to PACU RN and Post -op Vital signs reviewed and stable  Post vital signs: Reviewed and stable  Complications: No apparent anesthesia complications

## 2013-10-16 NOTE — Anesthesia Procedure Notes (Addendum)
Procedure Name: Intubation Date/Time: 10/16/2013 8:04 PM Performed by: Jacquiline Doe A Pre-anesthesia Checklist: Patient identified, Timeout performed, Emergency Drugs available, Suction available and Patient being monitored Patient Re-evaluated:Patient Re-evaluated prior to inductionOxygen Delivery Method: Circle system utilized Preoxygenation: Pre-oxygenation with 100% oxygen Intubation Type: IV induction, Rapid sequence and Cricoid Pressure applied Laryngoscope Size: Mac Grade View: Grade I Tube type: Oral Tube size: 7.5 mm Number of attempts: 1 Airway Equipment and Method: Stylet and Video-laryngoscopy Placement Confirmation: ETT inserted through vocal cords under direct vision,  breath sounds checked- equal and bilateral and positive ETCO2 Secured at: 22 cm Tube secured with: Tape Dental Injury: Teeth and Oropharynx as per pre-operative assessment  Difficulty Due To: Difficulty was anticipated, Difficult Airway- due to reduced neck mobility and Difficult Airway- due to dentition

## 2013-10-16 NOTE — Care Management Note (Addendum)
    Page 1 of 1   10/21/2013     4:39:23 PM CARE MANAGEMENT NOTE 10/21/2013  Patient:  Tamara Stephenson, Tamara Stephenson   Account Number:  [id number]  Date Initiated:  10/16/2013  Documentation initiated by:  [facility]  Subjective/Objective Assessment:   adm: deranged labs, Pyelonephritis  from [facility]     Action/Plan:   SNF   Anticipated DC Date:  10/21/2013   Anticipated DC Plan:  [place]  In-house referral  Clinical Social Worker      DC Planning Services  CM consult      Choice offered to / List presented to:             Status of service:  Completed, signed off Medicare Important Message given?  YES (If response is "NO", the following Medicare IM given date fields will be blank) Date Medicare IM given:  10/12/2013 Medicare IM given by:   Date Additional Medicare IM given:  10/21/2013 Additional Medicare IM given by:  Tomi Bamberger  Discharge Disposition:  [place]  Per UR Regulation:  Reviewed for med. necessity/level of care/duration of stay  If discussed at [place] of Stay Meetings, dates discussed:    Comments:  10/21/13 [place], BSN [phone] patient for dc to snf , CSW aware.  10/16/13 08:40 Cm gave pt IM.  CSW arranging return to [facility]; no other CM needs were communicated. Mariane Masters, BSN, [place].

## 2013-10-16 NOTE — Op Note (Signed)
Date of procedure: 10/16/2013  Date of dictation: Same  Service: Neurosurgery  Preoperative diagnosis: Lumbar epidural abscess, spontaneous  Postoperative diagnosis: Same  Procedure Name: L2-L5 decompressive laminectomy with evacuation of epidural abscess  Surgeon:Karrington Studnicka A.Giovannina Mun, M.D.  Asst. Surgeon: None  Anesthesia: General  Indication: 80 year old female with back pain and lower extremity pain weakness and sensory loss consistent with cauda equina syndrome. Workup demonstrates evidence of critical stenosis secondary to compressive dorsal epidural mass most consistent with epidural abscess. Patient presents now for emergent decompressive surgery  Operative note: After induction anesthesia, patient positioned prone on the Wilson frame and appropriately padded. Lumbar region prepped and draped. Incision made from L2-L5. Subperiosteal dissection performed bilaterally. Retractor placed. X-ray taken. Levels confirmed. Decompressive laminectomy then performed using Leksell rongeurs Kerrison rongeurs the high-speed drill to remove the entire lamina of L2, L3, L4 and the superior aspect of lamina of L5. Ligament flavum was elevated and resected so fashion using Kerrison rongeurs. Medial facetectomies were performed. Gross purulence was encountered at L2-3 and L4-5. Cultures were taken and sent to lab. After very thorough decompression achieved. There is no evidence of injury to thecal sac and nerve roots. Wound is then irrigated with and bike solution. Vancomycin powder was applied topically. A medium Hemovac drain was left in the epidural space. Wounds and close in layers with Vicryl sutures. Steri-Strips and sterile dressing were applied. No apparent complications. Patient returns to the recovery room in stable condition.

## 2013-10-16 NOTE — Anesthesia Preprocedure Evaluation (Signed)
Anesthesia Evaluation  Patient identified by MRN, date of birth, ID band Patient awake    Reviewed: Allergy & Precautions, H&P , NPO status , Patient's Chart, lab work & pertinent test results  Airway Mallampati: II  Neck ROM: full    Dental   Pulmonary neg pulmonary ROS,          Cardiovascular hypertension, + Peripheral Vascular Disease     Neuro/Psych    GI/Hepatic   Endo/Other  diabetes, Type 2obese  Renal/GU Renal InsufficiencyRenal disease     Musculoskeletal   Abdominal   Peds  Hematology   Anesthesia Other Findings   Reproductive/Obstetrics                           Anesthesia Physical Anesthesia Plan  ASA: II  Anesthesia Plan: General   Post-op Pain Management:    Induction: Intravenous  Airway Management Planned: Oral ETT  Additional Equipment:   Intra-op Plan:   Post-operative Plan: Extubation in OR  Informed Consent: I have reviewed the patients History and Physical, chart, labs and discussed the procedure including the risks, benefits and alternatives for the proposed anesthesia with the patient or authorized representative who has indicated his/her understanding and acceptance.     Plan Discussed with: CRNA, Anesthesiologist and Surgeon  Anesthesia Plan Comments:         Anesthesia Quick Evaluation

## 2013-10-17 ENCOUNTER — Inpatient Hospital Stay (HOSPITAL_COMMUNITY): Payer: Medicare HMO

## 2013-10-17 DIAGNOSIS — Z9889 Other specified postprocedural states: Secondary | ICD-10-CM

## 2013-10-17 DIAGNOSIS — G062 Extradural and subdural abscess, unspecified: Secondary | ICD-10-CM

## 2013-10-17 DIAGNOSIS — M545 Low back pain, unspecified: Secondary | ICD-10-CM

## 2013-10-17 LAB — GLUCOSE, CAPILLARY
GLUCOSE-CAPILLARY: 276 mg/dL — AB (ref 70–99)
GLUCOSE-CAPILLARY: 245 mg/dL — AB (ref 70–99)
Glucose-Capillary: 297 mg/dL — ABNORMAL HIGH (ref 70–99)
Glucose-Capillary: 267 mg/dL — ABNORMAL HIGH (ref 70–99)

## 2013-10-17 LAB — BASIC METABOLIC PANEL
Anion gap: 18 — ABNORMAL HIGH (ref 5–15)
BUN: 45 mg/dL — AB (ref 6–23)
CALCIUM: 8.3 mg/dL — AB (ref 8.4–10.5)
CHLORIDE: 102 meq/L (ref 96–112)
CO2: 14 mEq/L — ABNORMAL LOW (ref 19–32)
CREATININE: 1.43 mg/dL — AB (ref 0.50–1.10)
GFR calc non Af Amer: 34 mL/min — ABNORMAL LOW (ref >90)
GFR, EST AFRICAN AMERICAN: 40 mL/min — AB (ref >90)
Glucose, Bld: 290 mg/dL — ABNORMAL HIGH (ref 70–99)
Potassium: 4.3 mEq/L (ref 3.7–5.3)
Sodium: 134 mEq/L — ABNORMAL LOW (ref 137–147)

## 2013-10-17 LAB — CBC
HCT: 29.2 % — ABNORMAL LOW (ref 36.0–46.0)
Hemoglobin: 10.3 g/dL — ABNORMAL LOW (ref 12.0–15.0)
MCH: 34.4 pg — AB (ref 26.0–34.0)
MCHC: 35.3 g/dL (ref 30.0–36.0)
MCV: 97.7 fL (ref 78.0–100.0)
Platelets: 338 10*3/uL (ref 150–400)
RBC: 2.99 MIL/uL — AB (ref 3.87–5.11)
RDW: 15.4 % (ref 11.5–15.5)
WBC: 14.5 10*3/uL — ABNORMAL HIGH (ref 4.0–10.5)

## 2013-10-17 MED ORDER — INSULIN ASPART 100 UNIT/ML ~~LOC~~ SOLN
3.0000 [IU] | Freq: Three times a day (TID) | SUBCUTANEOUS | Status: DC
  Administered 2013-10-17 – 2013-10-21 (×11): 3 [IU] via SUBCUTANEOUS

## 2013-10-17 MED ORDER — HYDROMORPHONE HCL PF 1 MG/ML IJ SOLN
1.0000 mg | INTRAMUSCULAR | Status: DC | PRN
  Administered 2013-10-17 – 2013-10-21 (×6): 1 mg via INTRAVENOUS
  Filled 2013-10-17 (×6): qty 1

## 2013-10-17 MED ORDER — CHLORHEXIDINE GLUCONATE 0.12 % MT SOLN
15.0000 mL | Freq: Two times a day (BID) | OROMUCOSAL | Status: DC
  Administered 2013-10-17 – 2013-10-21 (×9): 15 mL via OROMUCOSAL
  Filled 2013-10-17 (×10): qty 15

## 2013-10-17 MED ORDER — CEFTRIAXONE SODIUM 2 G IJ SOLR
2.0000 g | Freq: Two times a day (BID) | INTRAMUSCULAR | Status: DC
  Administered 2013-10-17: 2 g via INTRAVENOUS
  Filled 2013-10-17 (×3): qty 2

## 2013-10-17 MED ORDER — BIOTENE DRY MOUTH MT LIQD
1.0000 "application " | Freq: Four times a day (QID) | OROMUCOSAL | Status: DC
  Administered 2013-10-17 – 2013-10-21 (×15): 15 mL via OROMUCOSAL

## 2013-10-17 MED ORDER — DEXTROSE 5 % IV SOLN: 2.0000 g | INTRAVENOUS | Status: DC

## 2013-10-17 MED ORDER — VANCOMYCIN HCL IN DEXTROSE 1-5 GM/200ML-% IV SOLN
1000.0000 mg | INTRAVENOUS | Status: DC
  Administered 2013-10-17: 1000 mg via INTRAVENOUS
  Filled 2013-10-17 (×3): qty 200

## 2013-10-17 MED ORDER — INSULIN DETEMIR 100 UNIT/ML ~~LOC~~ SOLN
12.0000 [IU] | Freq: Every day | SUBCUTANEOUS | Status: DC
  Administered 2013-10-17 – 2013-10-20 (×4): 12 [IU] via SUBCUTANEOUS
  Filled 2013-10-17 (×6): qty 0.12

## 2013-10-17 MED ORDER — PROPOFOL 10 MG/ML IV EMUL
5.0000 ug/kg/min | INTRAVENOUS | Status: DC
  Filled 2013-10-17: qty 100

## 2013-10-17 MED ORDER — VANCOMYCIN HCL 10 G IV SOLR: 1500.0000 mg | INTRAVENOUS | Status: DC

## 2013-10-17 NOTE — Progress Notes (Signed)
CSW continuing to follow for disposition planning.   CSW noted that pt transferred to [facility] room [facility].  Pt admitted from [place], and CSW was following for placement at [facility].  CSW contacted covering unit CSW to provide verbal handoff.   CSW contacted [facility] to update facility and provided facility with contact information of Education officer, museum following pt at [facility].   [facility] unit CSW to continue to follow to assist with pt disposition planning.  This CSW signing off at this time.  Alison Murray, MSW, [place] Work [phone]

## 2013-10-17 NOTE — Progress Notes (Signed)
Clinical Social Work Department CLINICAL SOCIAL WORK PLACEMENT NOTE 10/17/2013  Patient:  Tamara Stephenson, Tamara Stephenson  Account Number:  [id number] Admit date:  10/12/2013  Clinical Social Worker:  Ulyess Blossom  Date/time:  10/16/2013 04:50 PM  Clinical Social Work is seeking post-discharge placement for this patient at the following level of care:   SKILLED NURSING   (*CSW will update this form in Epic as items are completed)   10/16/2013  Patient/family provided with [place] Department of Clinical Social Work's list of facilities offering this level of care within the geographic area requested by the patient (or if unable, by the patient's family).  10/16/2013  Patient/family informed of their freedom to choose among providers that offer the needed level of care, that participate in Medicare, Medicaid or managed care program needed by the patient, have an available bed and are willing to accept the patient.  10/16/2013  Patient/family informed of [facility]' ownership interest in [facility], as well as of the fact that they are under no obligation to receive care at this facility.  PASARR submitted to EDS on 10/16/2013 PASARR number received on 10/16/2013  FL2 transmitted to all facilities in geographic area requested by pt/family on  10/16/2013 FL2 transmitted to all facilities within larger geographic area on   Patient informed that his/her managed care company has contracts with or will negotiate with  certain facilities, including the following:     Patient/family informed of bed offers received:   Patient chooses bed at  Physician recommends and patient chooses bed at    Patient to be transferred to  on   Patient to be transferred to facility by  Patient and family notified of transfer on  Name of family member notified:    The following physician request were entered in Epic:   Additional Comments:   Alison Murray, MSW, [place] Work [phone]

## 2013-10-17 NOTE — OR Nursing (Signed)
Have updated Dr. Marcie Bal to patient's persistant slow recovery from anesthesia and he believes she is unlikely to safely extubate tonight. Updated Dr. Annette Stable of assessments and Dr. Trude Mcburney opinion. Orders recvd for patient to remain intubated and admit to ICU.

## 2013-10-17 NOTE — Progress Notes (Signed)
Clinical Social Work Department BRIEF PSYCHOSOCIAL ASSESSMENT Late Entry 8/122015  Patient:  Tamara Stephenson, Tamara Stephenson     Account Number:  [id number]     Admit date:  10/12/2013  Clinical Social Worker:  Ulyess Blossom  Date/Time:  10/16/2013 04:50 PM  Referred by:  Physician  Date Referred:  10/16/2013 Referred for  SNF Placement   Other Referral:   Interview type:  Patient Other interview type:    PSYCHOSOCIAL DATA Living Status:  FACILITY Admitted from facility:  [place], [facility] Level of care:  Independent Living Primary support name:  Ziki Corbette/friend/[phone] Primary support relationship to patient:  FRIEND Degree of support available:   lacking    CURRENT CONCERNS Current Concerns  Post-Acute Placement   Other Concerns:    SOCIAL WORK ASSESSMENT / PLAN CSW received referral that pt admitted from [place] and PT recommending SNF.    CSW met with pt at bedside. CSW introduced self and explained role. Pt confirmed that she is a resident at [facility]. CSW discussed with pt that PT recommends higher level of care at [facility] upon discharge. CSW discussed that [facility] has a SNF in order for pt to remain within the same facility. Pt states that she feels that will likely be a good option, but wanted to speak with the facility more about what they offer at skilled section. CSW expressed understanding and discussed with pt that CSW can notify facility in order for facility to be in touch with pt. Pt appreciative of this.    CSW completed FL2 and sent clinicals to [facility]. CSW spoke with facility who stated that they will likely send a representative to see pt and further discuss as pt does not have strong support system. [facility] SNF states that they will be able to accept pt upon discharge and will contact pt insurance, [employer] regarding authorization.    CSW to continue to follow to assist with pt disposition planning.    Assessment/plan status:  Psychosocial Support/Ongoing Assessment of Needs Other assessment/ plan:   discharge planning   Information/referral to community resources:   Referral to [facility]    PATIENT'S/FAMILY'S RESPONSE TO PLAN OF CARE: Pt alert and oriented x 4. Pt had just returned from MRI. Pt agrees that she will need further assistance and would like to further discuss with [facility] about their SNF.    Alison Murray, MSW, [place] Work [phone]

## 2013-10-17 NOTE — Progress Notes (Signed)
Physical Therapy Evaluation Patient Details Name: Tamara Stephenson MRN: [id number] DOB: [birth year] Today's Date: 10/17/2013   History of Present Illness  80 yo female admitted with DM, back pain, inability to walk. Pt underwent  10/16/13 laminectomy L2-4. Pt dx with UTI and severe back pain.  Hx of DM, back pain, HTN. Pt is from [place] [facility]  Clinical Impression  Patient reassessed post L2-L5 decompressive laminectomy and evacuation of abscess. Patient demonstrates deficits in functional mobility as indicated below. Will need continued skilled PT to address deficits and maximize function. Will see as indicated and progress as tolerated. At this time encourage staff to use Lift equipment for OOB mobility as patient is very limited with activity tolerance and functional strength.    Follow Up Recommendations SNF    Equipment Recommendations       Recommendations for Other Services       Precautions / Restrictions Precautions Precautions: Fall;Back Precaution Comments: reviewed back precautions with patient for comfort with mobility Restrictions Weight Bearing Restrictions: No      Mobility  Bed Mobility               General bed mobility comments: recieved in chair  Transfers Overall transfer level: Needs assistance Equipment used: 2 person hand held assist Transfers: Sit to/from Stand Sit to Stand: +2 physical assistance;Max assist         General transfer comment: unable to clear >3 inches from chair on 3 attempts, patient with increased fatigue and reports pain in BLEs  Ambulation/Gait                Stairs            Wheelchair Mobility    Modified Rankin (Stroke Patients Only)       Balance     Sitting balance-Leahy Scale: Fair       Standing balance-Leahy Scale: Zero                               Pertinent Vitals/Pain Pain Score: 4  Pain Location: low back region Pain Intervention(s): Limited activity within  patient's tolerance;Monitored during session;Repositioned    Home Living Family/patient expects to be discharged to:: Assisted living     Type of Home: Apartment Home Access: Level entry     Home Layout: One level Home Equipment: Walker - 2 wheels;Other (comment) Additional Comments: "motorized tryicycle" for long distances    Prior Function Level of Independence: Independent with assistive device(s)         Comments: uses cane in room     Hand Dominance   Dominant Hand: Right    Extremity/Trunk Assessment   Upper Extremity Assessment: Defer to OT evaluation           Lower Extremity Assessment: Generalized weakness RLE Deficits / Details: decr strength compared to LT LE, unable to complete AROM, 3-/5 gross strength LLE Deficits / Details: Able to complete ROM, but unable to withstand against resistance  Cervical / Trunk Assessment: Kyphotic  Communication   Communication: No difficulties  Cognition Arousal/Alertness: Awake/alert Behavior During Therapy: WFL for tasks assessed/performed Overall Cognitive Status: Within Functional Limits for tasks assessed                      General Comments      Exercises        Assessment/Plan    PT Assessment Patient needs continued PT services  PT Diagnosis Difficulty walking;Generalized weakness;Acute pain   PT Problem List Decreased mobility;Decreased balance;Decreased activity tolerance;Pain;Decreased knowledge of use of DME;Obesity;Decreased range of motion;Decreased strength  PT Treatment Interventions DME instruction;Gait training;Functional mobility training;Therapeutic activities;Patient/family education;Balance training;Therapeutic exercise   PT Goals (Current goals can be found in the Care Plan section) Acute Rehab PT Goals Patient Stated Goal: to walk again PT Goal Formulation: With patient Time For Goal Achievement: 10/31/13 Potential to Achieve Goals: Fair    Frequency Min 3X/week    Barriers to discharge        Co-evaluation               End of Session Equipment Utilized During Treatment: Gait belt Activity Tolerance: Patient limited by fatigue;Patient limited by pain Patient left: in chair;with call bell/phone within reach;with nursing/sitter in room Nurse Communication: Mobility status;Need for lift equipment         Time: 1422-1440 PT Time Calculation (min): 18 min   Charges:   PT Evaluation $PT Re-evaluation: 1 Procedure PT Treatments $Therapeutic Activity: 8-22 mins   PT G CodesDuncan Dull 10/17/2013, 5:31 PM Alben Deeds, [place] DPT  [phone]

## 2013-10-17 NOTE — Progress Notes (Signed)
eLink Physician-Brief Progress Note Patient Name: Tamara Stephenson DOB: 1933-10-26 MRN: [id number]   Date of Service  10/17/2013  HPI/Events of Note   Lumbar abscess drained Intubated / ventilated post op   eICU Interventions   Vent orders ABG PCXR Propofol gtt GI / VTE / VAP Px Bedside provider to evaluate      Intervention Category Major Interventions: Respiratory failure - evaluation and management  [name] 10/17/2013, 1:58 AM

## 2013-10-17 NOTE — Progress Notes (Signed)
Patient trasfered from 58M to [phone] via bed; alert and oriented x 4 with some confusion; no complaints of pain; IV saline locked in LFA and one in RFA; skin - incision on lower back with dressing on, intact, clean, wound vac in place. Orient patient to room and unit; instructed how to use the call bell and  fall risk precautions. Will continue to monitor the patient.

## 2013-10-17 NOTE — Progress Notes (Signed)
UR completed.  Dewight Catino, RN BSN [facility] CCM Trauma/Neuro ICU Case Manager [phone]  

## 2013-10-17 NOTE — Procedures (Signed)
Extubation Procedure Note  Patient Details:   Name: Tamara Stephenson DOB: 1933/12/24 MRN: [id number]   Airway Documentation:  Pre Extubation: NIF: -50, VC: 2L, positive cuff leak, follows all commands Post Extubation: IS: 1500, able to speak name/location, no stridor  Evaluation  O2 sats: stable throughout Complications: No apparent complications Patient did tolerate procedure well. Bilateral Breath Sounds: Clear   Yes  Sharen Hint 10/17/2013, 2:54 AM

## 2013-10-17 NOTE — Evaluation (Signed)
Occupational Therapy Evaluation Patient Details Name: Tamara Stephenson MRN: [id number] DOB: 1933/08/21 Today's Date: 10/17/2013    History of Present Illness 80 yo female admitted with DM, back pain, inability to walk. Pt underwent  10/16/13 laminectomy L2-4. Pt dx with UTI and severe back pain.  Hx of DM, back pain, HTN. Pt is from [place] [facility]   Clinical Impression   Patient is s/p laminectomy L2-4 surgery resulting in functional limitations due to the deficits listed below (see OT problem list). PTA pt lived at [place] [facility] with progressive decline over last 5 weeks. Patient will benefit from skilled OT acutely to increase independence and safety with ADLS to allow discharge SNF. OT to follow acutely for adl retraining and basic transfers. Pt with limited ability to weight bear bil LE due to weakness.      Follow Up Recommendations  SNF;Supervision/Assistance - 24 hour    Equipment Recommendations  3 in 1 bedside comode;Wheelchair (measurements OT);Wheelchair cushion (measurements OT);Hospital bed;Other (comment) (hoyer lfit)    Recommendations for Other Services       Precautions / Restrictions Precautions Precautions: Fall;Back      Mobility Bed Mobility Overal bed mobility: Needs Assistance;+2 for physical assistance Bed Mobility: Rolling;Sidelying to Sit;Supine to Sit Rolling: +2 for physical assistance;Mod assist Sidelying to sit: +2 for physical assistance;Mod assist Supine to sit: +2 for physical assistance;Mod assist     General bed mobility comments: educated on log roll for back precautions and safety  Transfers Overall transfer level: Needs assistance Equipment used: 2 person hand held assist Transfers: Sit to/from Stand;Stand Pivot Transfers Sit to Stand: +2 physical assistance;Max assist Stand pivot transfers: +2 physical assistance;Max assist       General transfer comment: (see above adl section)    Balance Overall balance assessment:  Needs assistance Sitting-balance support: Bilateral upper extremity supported;Feet supported Sitting balance-Leahy Scale: Fair     Standing balance support: Bilateral upper extremity supported;During functional activity Standing balance-Leahy Scale: Zero                              ADL Overall ADL's : Needs assistance/impaired Eating/Feeding: Minimal assistance;Sitting   Grooming: Minimal assistance;Sitting       Lower Body Bathing: Total assistance           Toilet Transfer: +2 for physical assistance;Maximal assistance;Stand-pivot           Functional mobility during ADLs: +2 for physical assistance;Maximal assistance (stand pivot only. RW was not safe) General ADL Comments: Pt supine on arrival and agreeable to session. pt very eager to participate. pt verbalized decr pain with mobiltiy. pt log rolled and states "that is so much better" pt with bil LE weakness but demonstrates improvements per pt report compared to prior to surg. Pt unsafe attempt with RW due to BIL LE weakness buckling. Pt required bil LE blocked and sit<>Stand with pad. Pad used for hip extension. Pt stand pivot to chair with thearpist / tech facilitating the weight shift.Pt repositioned in chair with hoyer lift for safety of RN staff.     Vision                     Perception     Praxis      Pertinent Vitals/Pain Pain Assessment: 0-10 Pain Score: 3  Pain Location: back Pain Intervention(s): Repositioned     Hand Dominance Right   Extremity/Trunk Assessment Upper Extremity  Assessment Upper Extremity Assessment: Generalized weakness;RUE deficits/detail;LUE deficits/detail RUE Deficits / Details: AROM 70 degrees, grip strength 4 out 5 PROM 90 degrees  RUE Coordination: decreased fine motor;decreased gross motor LUE Deficits / Details: AROM 70 degrees, grip strength 4 out 5 PROM 90 degrees LUE Coordination: decreased fine motor;decreased gross motor   Lower Extremity  Assessment Lower Extremity Assessment: Generalized weakness;RLE deficits/detail RLE Deficits / Details: decr strength compared to LT LE: hip flexion 3- out 5 knee extension 3- out 5   Cervical / Trunk Assessment Cervical / Trunk Assessment: Kyphotic   Communication Communication Communication: No difficulties   Cognition Arousal/Alertness: Awake/alert Behavior During Therapy: WFL for tasks assessed/performed Overall Cognitive Status: Within Functional Limits for tasks assessed                     General Comments       Exercises       Shoulder Instructions      Home Living Family/patient expects to be discharged to:: Assisted living     Type of Home: Apartment Home Access: Level entry     Home Layout: One level               Home Equipment: Walker - 2 wheels;Other (comment)   Additional Comments: "motorized tryicycle" for long distances      Prior Functioning/Environment Level of Independence: Independent with assistive device(s)        Comments: uses cane in room    OT Diagnosis: Generalized weakness;Acute pain;Paresis   OT Problem List: Decreased strength;Decreased activity tolerance;Impaired balance (sitting and/or standing);Decreased safety awareness;Decreased knowledge of use of DME or AE;Decreased knowledge of precautions;Cardiopulmonary status limiting activity;Obesity;Impaired UE functional use;Pain   OT Treatment/Interventions: Self-care/ADL training;Neuromuscular education;Therapeutic exercise;DME and/or AE instruction;Therapeutic activities;Patient/family education;Balance training    OT Goals(Current goals can be found in the care plan section) Acute Rehab OT Goals Patient Stated Goal: to walk again OT Goal Formulation: With patient Time For Goal Achievement: 10/31/13 Potential to Achieve Goals: Good  OT Frequency: Min 2X/week   Barriers to D/C: Decreased caregiver support          Co-evaluation              End of  Session Equipment Utilized During Treatment: Gait belt;Rolling walker Nurse Communication: Mobility status;Precautions;Need for lift equipment  Activity Tolerance: Patient tolerated treatment well Patient left: in chair;with call bell/phone within reach;with nursing/sitter in room   Time: 1006-1028 OT Time Calculation (min): 22 min Charges:  OT General Charges $OT Visit: 1 Procedure OT Evaluation $Initial OT Evaluation Tier I: 1 Procedure OT Treatments $Self Care/Home Management : 8-22 mins G-Codes:    Parke Poisson B 06-Nov-2013, 12:18 PM

## 2013-10-17 NOTE — Progress Notes (Signed)
ANTIBIOTIC CONSULT NOTE - INITIAL  Pharmacy Consult for vancomycin  Indication: epidural abcess and drainage  Allergies  Allergen Reactions  . Peanut-Containing Drug Products Nausea And Vomiting    Patient Measurements: Height: 5\' 6"  (167.6 cm) Weight: 223 lb 8.7 oz (101.4 kg) IBW/kg (Calculated) : 59.3 Adjusted Body Weight:   Vital Signs: Temp: 97.4 F (36.3 C) (08/13 0200) Temp src: Oral (08/13 0200) BP: 143/68 mmHg (08/13 0200) Pulse Rate: 78 (08/13 0200) Intake/Output from previous day: 08/12 0701 - 08/13 0700 In: 2450 [I.V.:2450] Out: 3700 [Urine:3350; Drains:100; Blood:250] Intake/Output from this shift: Total I/O In: 2450 [I.V.:2450] Out: 1700 [Urine:1350; Drains:100; Blood:250]  Labs:  Recent Labs  10/14/13 0415 10/14/13 1027 10/15/13 0427 10/16/13 0412  WBC 15.0*  --  14.3*  --   HGB 12.0  --  11.4*  --   PLT 294  --  296  --   CREATININE 1.35* 1.54* 1.98* 1.87*   Estimated Creatinine Clearance: 30.3 ml/min (by C-G formula based on Cr of 1.87). No results found for this basename: Letta Median, VANCORANDOM, GENTTROUGH, GENTPEAK, GENTRANDOM, TOBRATROUGH, TOBRAPEAK, TOBRARND, AMIKACINPEAK, AMIKACINTROU, AMIKACIN,  in the last 72 hours   Microbiology: Recent Results (from the past 720 hour(s))  URINE CULTURE     Status: None   Collection Time    10/12/13 10:42 AM      Result Value Ref Range Status   Specimen Description URINE, CATHETERIZED   Final   Special Requests Normal   Final   Culture  Setup Time     Final   Value: 10/12/2013 13:40     Performed at [place]     Final   Value: >=100,000 COLONIES/ML     Performed at [facility]   Culture     Final   Value: ESCHERICHIA COLI     Performed at [facility]   Report Status 10/14/2013 FINAL   Final   Organism ID, Bacteria ESCHERICHIA COLI   Final  SURGICAL PCR SCREEN     Status: None   Collection Time    10/16/13  6:20 PM      Result Value  Ref Range Status   MRSA, PCR NEGATIVE  NEGATIVE Final   Staphylococcus aureus NEGATIVE  NEGATIVE Final   Comment:            The Xpert SA Assay (FDA     approved for NASAL specimens     in patients over 22 years of age),     is one component of     a comprehensive surveillance     program.  Test performance has     been validated by [employer] for patients greater     than or equal to 77 year old.     It is not intended     to diagnose infection nor to     guide or monitor treatment.    Medical History: Past Medical History  Diagnosis Date  . Back pain   . Hypertension   . Carotid stenosis   . Colitis   . Diabetes mellitus without complication     Medications:  Received 1gm pre-op at ~2100  Assessment: 80 yo female with l2-l5 decompression and lumbar abscess drainage. Vanc post op for  Goal of Therapy:  Vancomycin trough level 15-20 mcg/ml  Plan:  Start vancomycin 1gm q24h at 0600 (9 hours after pre-op dose to give [place]o vanc load)   Hedy Camara,  [place] Heir 10/17/2013,2:31 AM

## 2013-10-17 NOTE — Progress Notes (Signed)
Postop day 1. Patient awake and alert. Mental status much better. States she is in no pain. States that legs feel better.  Afebrile. Vitals are stable. Motor examination significantly improved. Now better than antigravity strength in the left lower extremity. Right at antigravity strength on her right side. Wound clean and dry. Chest and abdomen benign.  Status post decompressive laminectomy and evacuation of epidural abscess. Patient much improved. Continue antibiotics. Followup cultures. Patient may be transferred from ICU. Patient should continue on the hospitalist service given multiple medical issues.

## 2013-10-17 NOTE — Progress Notes (Addendum)
[facility] PROGRESS NOTE  Tamara Stephenson MEQ:[id number] DOB: October 23, 1933 DOA: 10/12/2013 PCP: Curly Rim, MD Brief narrative 80 year old female with history of type 2 diabetes mellitus, hypertension, fatty liver, presented with complaints of progressive low back pain and lower extremity weakness with off-and-on fevers at home. Patient found to have UTI with acute kidney injury along with hyponatremia. CT of the abdomen and pelvis showed scoliosis with degenerative disc disease. Patient admitted for UTI and acute kidney injury and started on empiric levaquin. Hospital course prolonged due to finding of lumbar epidural abscess and cord involvement   Assessment/Plan: Escherichia coli UTI Continue empiric Levaquin. Denies any dysuria or flank pain.  Progressive chronic low back pain Patient reported progressive back pain with lower extremity weakness limiting her mobility at home for several  weeks. She was hardly able to move around in the house and was eating cat food. She had low back tenderness on exam.  obtained  lumbar spine MRI without contrast on 8/13. MRI of the LS spine showed severe L3-L4 spinal stenosis on MRI with epidural fluid collection and b/l Hydronephrosis possibly secondary to cauda equina as per radiologist.  -consulted neurosurgery Dr Annette Stable who had [facility]12 and performed L2-L5 decompressive laminectomy with evacuation of epidural abscess. wound irrigated, vancomycin powder applied topically and an epidural wound vac placed. tolerated sx well.  -Abscess cx so far negative but gm stain shows rare GNR. Marland Kitchen On empiric rocephin since this am. Spoke with ID Dr Megan Salon who recommends to d/c rocephin and continue levaquin for now and monitor for sensitivity. He will see pt on 8/14. - reorder PT. Seen prior to  sx and recommended skilled nursing facility.   Type 2 diabetes mellitus Noted for episode of hypoglycemia on 8/11. Place on sliding scale insulin and  levemir 4u at bedtime. (Patient on 9 units at home). FSG high since yesterday. Will increase levemir to 12 units at bedtime and add premeal aspart 3 u tid. A1C of 8.7   Acute vs acute on  kidney injury Possibly in the setting of UTI, versus chronic kidney disease secondary to diabetic nephropathy. patient was taking regular NSAIDs at home. Off NSAIDs. Improved with hydration. No previous renal function in the system.  Bilateral leg edema and elevated proBNP Patient does report getting short of breath with minimal ambulation.  obtain 2-D echo to evaluate LV function.  Anemia Appears to be chronic and patient on iron supplements at home.   TSH wnl. Check  B12 level.  hyponatremia Supposedly hypovolmic. Improving with fluids  Metabolic acidosis Possibly due to dehydration Replenishing with sodium bicarbonate tablets. Will increase to tid.   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 Asymptomatic. Given uncontrolled DM needs outpt surgical evaluation. Also has mild hyperbilirubinemia and elevated alk P. Normal transaminases. Needs monitoring  DVT prophylaxis: Subcutaneous heparin  Diet: Diabetic  Code Status: Full code Family Communication: None. Friends from independent living at bedside Disposition Plan: transfer to medical floor. Skilled nursing facility as per PT.   Consultants:  Neurosurgery  ID  Procedures:  None  Antibiotics:  levaquin ( 8/8--)  IV rocephin (8/13x 2 doses )  HPI/Subjective: Patient seen and examined . Reports back pain better, but still having RLE weakness  Objective: Filed Vitals:   10/17/13 1200  BP: 106/65  Pulse: 62  Temp: 98 F (36.7 C)  Resp: 8    Intake/Output Summary (Last 24 hours) at 10/17/13 1541 Last data filed at 10/17/13 1430  Gross per 24  hour  Intake   2940 ml  Output   6200 ml  Net  -3260 ml   Filed Weights   10/14/13 0551 10/15/13 0532 10/17/13 0200  Weight: 96.389 kg (212 lb 8 oz) 101.197 kg (223 lb 1.6 oz)  101.4 kg (223 lb 8.7 oz)    Exam:   General:  Elderly female in no acute distress  HEENT: No pallor, moist oral mucosa  Chest: Clear to auscultation bilaterally, no added sound  CVS: Normal S1-S2, no murmurs or gallop  Abdomen: Soft, nontender, nondistended, bowel sounds present  extremities: 1+ pitting edema bilaterally, warm,epidural drain with woundvac., limited strength in bilateral lower extremity , foley in place  CNS: Alert and oriented.  Data Reviewed: Basic Metabolic Panel:  Recent Labs Lab 10/14/13 0415 10/14/13 1027 10/15/13 0427 10/16/13 0412 10/17/13 1200  NA 130* 131* 130* 130* 134*  K 4.0 3.9 3.6* 4.3 4.3  CL 98 98 99 101 102  CO2 15* 18* 18* 15* 14*  GLUCOSE 182* 137* 61* 200* 290*  BUN 39* 42* 48* 51* 45*  CREATININE 1.35* 1.54* 1.98* 1.87* 1.43*  CALCIUM 8.5 8.6 8.7 8.7 8.3*   Liver Function Tests:  Recent Labs Lab 10/12/13 0829 10/13/13 0525  AST 24 25  ALT 21 19  ALKPHOS 176* 156*  BILITOT 2.9* 2.8*  PROT 7.3 6.6  ALBUMIN 2.2* 1.9*   No results found for this basename: LIPASE, AMYLASE,  in the last 168 hours  Recent Labs Lab 10/13/13 1522  AMMONIA 38   CBC:  Recent Labs Lab 10/12/13 0829 10/13/13 0525 10/14/13 0415 10/15/13 0427 10/17/13 1200  WBC 17.6* 16.3* 15.0* 14.3* 14.5*  NEUTROABS  --   --  12.9* 10.8*  --   HGB 12.3 11.7* 12.0 11.4* 10.3*  HCT 33.7* 33.4* 32.7* 32.9* 29.2*  MCV 92.8 95.7 95.9 96.8 97.7  PLT 273 254 294 296 338   Cardiac Enzymes: No results found for this basename: CKTOTAL, CKMB, CKMBINDEX, TROPONINI,  in the last 168 hours BNP (last 3 results)  Recent Labs  10/12/13 1251  PROBNP 1099.0*   CBG:  Recent Labs Lab 10/16/13 1209 10/16/13 1749 10/16/13 2310 10/17/13 0742 10/17/13 1233  GLUCAP 167* 183* 194* 297* 276*    Recent Results (from the past 240 hour(s))  URINE CULTURE     Status: None   Collection Time    10/12/13 10:42 AM      Result Value Ref Range Status   Specimen  Description URINE, CATHETERIZED   Final   Special Requests Normal   Final   Culture  Setup Time     Final   Value: 10/12/2013 13:40     Performed at [facility]     Final   Value: >=100,000 COLONIES/ML     Performed at [facility]   Culture     Final   Value: ESCHERICHIA COLI     Performed at [facility]   Report Status 10/14/2013 FINAL   Final   Organism ID, Bacteria ESCHERICHIA COLI   Final  SURGICAL PCR SCREEN     Status: None   Collection Time    10/16/13  6:20 PM      Result Value Ref Range Status   MRSA, PCR NEGATIVE  NEGATIVE Final   Staphylococcus aureus NEGATIVE  NEGATIVE Final   Comment:            The Xpert SA Assay (FDA     approved for  NASAL specimens     in patients over 39 years of age),     is one component of     a comprehensive surveillance     program.  Test performance has     been validated by [employer] for patients greater     than or equal to 53 year old.     It is not intended     to diagnose infection nor to     guide or monitor treatment.  CULTURE, ROUTINE-ABSCESS     Status: None   Collection Time    10/16/13  8:42 PM      Result Value Ref Range Status   Specimen Description ABSCESS   Final   Special Requests EPIDURAL ABSCESS SPECIMEN NO 1   Final   Gram Stain     Final   Value: MODERATE WBC PRESENT,BOTH PMN AND MONONUCLEAR     NO SQUAMOUS EPITHELIAL CELLS SEEN     RARE GRAM NEGATIVE RODS     Performed at [facility]   Culture     Final   Value: NO GROWTH     Performed at [facility]   Report Status PENDING   Incomplete  CULTURE, ROUTINE-ABSCESS     Status: None   Collection Time    10/16/13  8:44 PM      Result Value Ref Range Status   Specimen Description ABSCESS   Final   Special Requests EPIDURAL LUMBAR ABSCESS SPECIMEN NO 2   Final   Gram Stain     Final   Value: RARE WBC PRESENT, PREDOMINANTLY MONONUCLEAR     NO SQUAMOUS EPITHELIAL CELLS SEEN     NO ORGANISMS SEEN      Performed at [facility]   Culture     Final   Value: NO GROWTH     Performed at [facility]   Report Status PENDING   Incomplete     Studies: Mr Lumbar Spine Wo Contrast  10/16/2013   CLINICAL DATA:  Acute on chronic low back pain.  EXAM: MRI LUMBAR SPINE WITHOUT CONTRAST  TECHNIQUE: Multiplanar, multisequence MR imaging of the lumbar spine was performed. No intravenous contrast was administered.  COMPARISON:  CT abdomen and pelvis 10/12/2013  FINDINGS: For the purposes of this dictation, the lowest well-formed intervertebral disc space is presumed to be L5-S1.  Prominent S-shaped thoracolumbar scoliosis is present, convex to the left in the lumbar spine. No significant listhesis is seen. Moderate to severe disc space narrowing is seen throughout the lumbar spine, with relative preservation of the L3-4 disc space. Diffuse degenerative marrow changes are present. No vertebral marrow edema is seen.  There is a dorsal epidural gas and fluid collection extending from L2 to the superior aspect of L4 predominantly in the midline and left of midline. The collection is predominantly fluid, with a small amount of gas superiorly at the L2 level. The amount of gas in the collection has greatly decreased from the recent abdominal CT, however the gas appears to have been replaced by fluid. There is mass effect on the thecal sac, greatest at L2-3 where there is severe spinal stenosis. At the L2 level, the collection measures 13 x 11 mm (transverse by AP). The collection measures 6-6.5 cm in craniocaudal length. A fluid collection posterior to the left L4-5 facet joint measures 11 x 13 x 26 mm. There is also a 10 x 4 mm fluid collection medial to the  left L4-5 facet in the epidural space. There is edema in the left-sided posterior paraspinal soft tissues of the mid to lower lumbar spine, greatest about the left L4-5 facet joint. There is also moderate marrow edema about the left L4-5 facet joint.   The tip of the conus medullaris is not clearly localized but is likely near the L1-2 disc space level. There is marked distention of the bladder, incompletely visualized. There is moderate bilateral hydroureteronephrosis.  T11-12: Disc bulge, osteophytic ridging, and moderate facet hypertrophy result in mild to moderate spinal stenosis and mild left neural foraminal stenosis.  T12-L1: Circumferential disc bulge, osteophytic ridging, and moderate facet hypertrophy result in moderate spinal stenosis, left greater than right lateral recess stenosis, and mild left neural foraminal stenosis.  L1-2: Circumferential disc bulge, osteophytic ridging, and moderate to severe facet hypertrophy result in mild spinal stenosis and mild right neural foraminal stenosis.  L2-3: Severe spinal stenosis predominantly due to the dorsal epidural fluid collection. There is disc bulging and osteophytic ridging asymmetric to the right along with moderate to severe right greater than left facet hypertrophy, resulting in moderate right neural foraminal stenosis.  L3-4: Severe spinal stenosis due to the dorsal epidural fluid collection, mild disc bulging, and severe facet hypertrophy. Mild right neural foraminal stenosis.  L4-5: Mild spinal stenosis due to osteophytic ridging, left lateral epidural fluid collection/synovial cyst, and moderate facet hypertrophy. No significant neural foraminal stenosis.  L5-S1: Left greater than right lateral recess stenosis due to disc bulging, osteophytic ridging, and moderate to severe facet hypertrophy. Mild right and moderate left neural foraminal stenosis.  IMPRESSION: 1. Epidural gas and fluid collection extending from L2 -L4. This contributes to severe spinal stenosis at L2-3 and L3-4 with complete effacement of the thecal sac. The amount of gas has decreased from the prior CT but the amount of fluid appears to have increased. This is concerning for epidural abscess. 2. Small fluid collections adjacent  to the left L4-5 facet joint with surrounding marrow and soft tissue edema. These may reflect acute on chronic facet arthritis and synovial cysts, however septic facet arthritis with associated abscesses is also of concern, particularly given the epidural collection. 3. Marked distention of the bladder with bilateral hydroureteronephrosis. This could potentially reflect urinary retention due to mass effect on the cauda equina. 4. Thoracolumbar scoliosis with diffuse, severe disc degeneration and facet arthrosis resulting in spinal canal and neural foraminal stenosis as above. These results were called by telephone at the time of interpretation on 10/16/2013 at 5:00 pm to Dr. Louellen Molder , who verbally acknowledged these results.   Electronically Signed   By: Logan Bores   On: 10/16/2013 17:04   Dg Lumbar Spine 1 View  10/16/2013   CLINICAL DATA:  Epidural abscess.  EXAM: LUMBAR SPINE - 1 VIEW  COMPARISON:  MRI dated 10/16/2013  FINDINGS: There is an instrument at the L3-4 level.  IMPRESSION: Instrument at L3-4.   Electronically Signed   By: Rozetta Nunnery M.D.   On: 10/16/2013 20:38   Dg Chest Port 1 View  10/17/2013   CLINICAL DATA:  Endotracheal tube placement.  EXAM: PORTABLE CHEST - 1 VIEW  COMPARISON:  None.  FINDINGS: The patient's endotracheal tube is seen ending 5 cm above the carina.  The lungs are hypoexpanded. Bibasilar airspace opacities likely reflect atelectasis. Pulmonary vascularity is at the upper limits of normal. No definite pleural effusion or pneumothorax is seen.  The cardiomediastinal silhouette is enlarged. No acute osseous abnormalities are identified.  IMPRESSION: 1. Endotracheal tube seen ending 5 cm above the carina. 2. Lungs hypoexpanded. Bibasilar airspace opacities likely reflect atelectasis. 3. Cardiomegaly.   Electronically Signed   By: Garald Balding M.D.   On: 10/17/2013 02:19    Scheduled Meds: . antiseptic oral rinse  1 application Mouth Rinse QID  . buPROPion  300  mg Oral Daily  . cefTRIAXone (ROCEPHIN)  IV  2 g Intravenous Q12H  . chlorhexidine  15 mL Mouth/Throat BID  . famotidine  40 mg Oral Daily  . feeding supplement (GLUCERNA SHAKE)  237 mL Oral BID BM  . gabapentin  300 mg Oral QHS  . insulin aspart  0-15 Units Subcutaneous TID WC  . insulin aspart  3 Units Subcutaneous TID WC  . insulin detemir  12 Units Subcutaneous QHS  . lactulose  20 g Oral BID  . levofloxacin  500 mg Oral Q48H  . polyethylene glycol  17 g Oral Daily  . sodium bicarbonate  650 mg Oral BID  . ursodiol  300 mg Oral TID  . vancomycin  1,000 mg Intravenous Q24H   Continuous Infusions: . propofol        Time spent: 25 minutes    Makari Sanko, [place]  [facility] Pager [phone]. If 7PM-7AM, please contact night-coverage at [email or web address], password [facility] 10/17/2013, 3:41 PM  LOS: 5 days

## 2013-10-18 ENCOUNTER — Encounter (HOSPITAL_COMMUNITY): Payer: Self-pay | Admitting: Neurosurgery

## 2013-10-18 DIAGNOSIS — N1330 Unspecified hydronephrosis: Secondary | ICD-10-CM

## 2013-10-18 DIAGNOSIS — R0989 Other specified symptoms and signs involving the circulatory and respiratory systems: Secondary | ICD-10-CM

## 2013-10-18 DIAGNOSIS — R29898 Other symptoms and signs involving the musculoskeletal system: Secondary | ICD-10-CM

## 2013-10-18 DIAGNOSIS — A498 Other bacterial infections of unspecified site: Secondary | ICD-10-CM

## 2013-10-18 DIAGNOSIS — D649 Anemia, unspecified: Secondary | ICD-10-CM

## 2013-10-18 DIAGNOSIS — M48061 Spinal stenosis, lumbar region without neurogenic claudication: Secondary | ICD-10-CM

## 2013-10-18 DIAGNOSIS — R0609 Other forms of dyspnea: Secondary | ICD-10-CM

## 2013-10-18 DIAGNOSIS — N12 Tubulo-interstitial nephritis, not specified as acute or chronic: Secondary | ICD-10-CM

## 2013-10-18 LAB — GLUCOSE, CAPILLARY
Glucose-Capillary: 186 mg/dL — ABNORMAL HIGH (ref 70–99)
Glucose-Capillary: 126 mg/dL — ABNORMAL HIGH (ref 70–99)
Glucose-Capillary: 189 mg/dL — ABNORMAL HIGH (ref 70–99)
Glucose-Capillary: 199 mg/dL — ABNORMAL HIGH (ref 70–99)

## 2013-10-18 LAB — BASIC METABOLIC PANEL
Anion gap: 10 (ref 5–15)
BUN: 42 mg/dL — AB (ref 6–23)
CO2: 17 mEq/L — ABNORMAL LOW (ref 19–32)
Calcium: 8.4 mg/dL (ref 8.4–10.5)
Chloride: 107 mEq/L (ref 96–112)
Creatinine, Ser: 1.35 mg/dL — ABNORMAL HIGH (ref 0.50–1.10)
GFR, EST AFRICAN AMERICAN: 43 mL/min — AB (ref >90)
GFR, EST NON AFRICAN AMERICAN: 37 mL/min — AB (ref >90)
Glucose, Bld: 164 mg/dL — ABNORMAL HIGH (ref 70–99)
Potassium: 3.3 mEq/L — ABNORMAL LOW (ref 3.7–5.3)
Sodium: 134 mEq/L — ABNORMAL LOW (ref 137–147)

## 2013-10-18 LAB — CBC
HCT: 27.4 % — ABNORMAL LOW (ref 36.0–46.0)
Hemoglobin: 9.6 g/dL — ABNORMAL LOW (ref 12.0–15.0)
MCH: 33.6 pg (ref 26.0–34.0)
MCHC: 35 g/dL (ref 30.0–36.0)
MCV: 95.8 fL (ref 78.0–100.0)
Platelets: 307 K/uL (ref 150–400)
RBC: 2.86 MIL/uL — ABNORMAL LOW (ref 3.87–5.11)
RDW: 15.4 % (ref 11.5–15.5)
WBC: 13.2 K/uL — ABNORMAL HIGH (ref 4.0–10.5)

## 2013-10-18 MED ORDER — BOOST / RESOURCE BREEZE PO LIQD
1.0000 | Freq: Three times a day (TID) | ORAL | Status: DC
  Administered 2013-10-18 – 2013-10-19 (×4): 1 via ORAL

## 2013-10-18 MED ORDER — HEPARIN SODIUM (PORCINE) 5000 UNIT/ML IJ SOLN
5000.0000 [IU] | Freq: Three times a day (TID) | INTRAMUSCULAR | Status: DC
  Administered 2013-10-18 – 2013-10-21 (×10): 5000 [IU] via SUBCUTANEOUS
  Filled 2013-10-18 (×12): qty 1

## 2013-10-18 MED ORDER — LEVOFLOXACIN 750 MG PO TABS
750.0000 mg | ORAL_TABLET | ORAL | Status: DC
  Administered 2013-10-20: 750 mg via ORAL
  Filled 2013-10-18: qty 1

## 2013-10-18 MED ORDER — VANCOMYCIN HCL IN DEXTROSE 750-5 MG/150ML-% IV SOLN
750.0000 mg | Freq: Two times a day (BID) | INTRAVENOUS | Status: DC
  Administered 2013-10-18: 750 mg via INTRAVENOUS
  Filled 2013-10-18: qty 150

## 2013-10-18 NOTE — Consult Note (Signed)
[facility] for Infectious Disease    Date of Admission:  10/12/2013    Total days of antibiotics 7        Day 5 Levaquin         Day 2 Vancomycin        Rocephin 8/8-8/9, 8/12         Reason for Consult: Epidural abscess    Referring Physician: Dr. Oren Binet Primary Care Physician: Dr. Talbert Forest Corrington  Active Problems:   Diabetes mellitus   Hyponatremia   UTI (lower urinary tract infection)   Acute kidney injury   Metabolic acidosis   Hypertension   Fatty liver disease, nonalcoholic   Adrenal adenoma   Porcelain gallbladder   S/P lumbar laminectomy   Anemia   . antiseptic oral rinse  1 application Mouth Rinse QID  . buPROPion  300 mg Oral Daily  . chlorhexidine  15 mL Mouth/Throat BID  . famotidine  40 mg Oral Daily  . feeding supplement (GLUCERNA SHAKE)  237 mL Oral BID BM  . gabapentin  300 mg Oral QHS  . insulin aspart  0-15 Units Subcutaneous TID WC  . insulin aspart  3 Units Subcutaneous TID WC  . insulin detemir  12 Units Subcutaneous QHS  . lactulose  20 g Oral BID  . levofloxacin  500 mg Oral Q48H  . polyethylene glycol  17 g Oral Daily  . sodium bicarbonate  650 mg Oral BID  . ursodiol  300 mg Oral TID  . vancomycin  1,000 mg Intravenous Q24H    Recommendations: 1. Continue Levaquin for now; await susceptibility testing 2. Discontinue Vancomycin  Assessment: 80 y/o F admitted on 10/12/13 for E. Coli pyelonephritis, started on Rocephin, then changed to Levaquin on 10/15/13. Patient continued to complain of progressive lower back pain (CT only significant for degenerative changes on admission). MRI performed on 10/16/13 showed severe L3-L4 spinal stenosis w/ dorsal epidural fluid and gas collection from L2-L4, concerning for epidural abscess. Also showed severe bladder distension w/ bilateral hydronephrosis thought to be secondary to abscess compression on cauda equina. Seen by neurosurgery (Dr. Trenton Gammon) on 10/16/13 who performed emergent  decompression in the setting of cauda equina syndrome. Abscess culture returned showing gram negative rods. On exam, patient is significantly improved after decompression, claiming her pain is better, but still has weakness in her lower extremities, right worse than left.   HPI: Tamara Stephenson is a 80 y.o. female w/ PMHx of DM type II, HTN, fatty liver disease, admitted on 10/12/13 after having a 3 week history of fevers at home. According to the patient, she claims she started having significant lower extremity weakness about 6 weeks ago which became progressively worse. Then, about 3-5 days prior to admission, she claims she started to have worsening fevers, chills, nausea, and vomiting, limiting her ability to eat. She also says that she has been having severe back pain for some time that was previously attributed to degenerative arthritis. The patient claims she started having difficulty urinating, saying that she would go prolonged periods of time w/out passing urin, but would then have what seems like overflow incontinence d/t urinary retention. She says she is somewhat unaware of how she got to the ED, but says that her back pain was so severe she needed to come to the hospital.   Review of Systems: General: Positive for decreased appetite. Denies fever, chills, diaphoresis, and fatigue.  Respiratory: Denies SOB, DOE, cough, chest tightness,  and wheezing.   Cardiovascular: Denies chest pain and palpitations.  Gastrointestinal: Denies nausea, vomiting, abdominal pain, diarrhea, constipation, blood in stool and abdominal distention.  Genitourinary: Denies dysuria, urgency, frequency, hematuria, and flank pain. Endocrine: Denies hot or cold intolerance, polyuria, and polydipsia. Musculoskeletal: Positive for back pain. Denies myalgias, joint swelling, arthralgias and gait problem.  Skin: Denies pallor, rash and wounds.  Neurological: Positive for lower extremity weakness. Denies dizziness, seizures,  syncope, lightheadedness, numbness and headaches.  Psychiatric/Behavioral: Denies mood changes, confusion, nervousness, sleep disturbance and agitation.    Past Medical History  Diagnosis Date  . Back pain   . Hypertension   . Carotid stenosis   . Colitis   . Diabetes mellitus without complication     History  Substance Use Topics  . Smoking status: Smoker, Current Status Unknown  . Smokeless tobacco: Not on file  . Alcohol Use: No    Family History  Problem Relation Age of Onset  . Leukemia Mother   . Jaundice Father    Allergies  Allergen Reactions  . Peanut-Containing Drug Products Nausea And Vomiting    OBJECTIVE: Blood pressure 101/58, pulse 72, temperature 98.3 F (36.8 C), temperature source Oral, resp. rate 16, height 5\' 6"  (1.676 m), weight 220 lb 10.9 oz (100.1 kg), SpO2 96.00%.  Physical Exam: General: Obese female, no acute distress, cooperative w/ exam HEENT: PERRL, Moist mucus membranes. Red blister on right lower lip Neck: Full range of motion without pain, supple, no lymphadenopathy or carotid bruits Lungs: Clear to ascultation bilaterally, normal work of respiration, no wheezes, rales, rhonchi Heart: Regular rate and rhythm, no murmurs, gallops, or rubs Abdomen: Soft, non-tender, non-distended, bowel sounds present. Foley catheter in place Back: 3-5" vertical lumbar incision covered w/ dressing. Small wound vac draining serosanguinous fluid.  Extremities: Trace pitting edema.  Neurologic: Alert & oriented X3, cranial nerves II-XII intact. Upper extremities 5/5 strength bilaterally. Lower extremities 2/5 on the right, 3/5 on the left.    Lab Results Lab Results  Component Value Date   WBC 13.2* 10/18/2013   HGB 9.6* 10/18/2013   HCT 27.4* 10/18/2013   MCV 95.8 10/18/2013   PLT 307 10/18/2013    Lab Results  Component Value Date   CREATININE 1.35* 10/18/2013   BUN 42* 10/18/2013   NA 134* 10/18/2013   K 3.3* 10/18/2013   CL 107 10/18/2013   CO2 17*  10/18/2013    Lab Results  Component Value Date   ALT 19 10/13/2013   AST 25 10/13/2013   ALKPHOS 156* 10/13/2013   BILITOT 2.8* 10/13/2013     Microbiology: Recent Results (from the past 240 hour(s))  URINE CULTURE     Status: None   Collection Time    10/12/13 10:42 AM      Result Value Ref Range Status   Specimen Description URINE, CATHETERIZED   Final   Special Requests Normal   Final   Culture  Setup Time     Final   Value: 10/12/2013 13:40     Performed at [place]     Final   Value: >=100,000 COLONIES/ML     Performed at [facility]   Culture     Final   Value: ESCHERICHIA COLI     Performed at [facility]   Report Status 10/14/2013 FINAL   Final   Organism ID, Bacteria ESCHERICHIA COLI   Final  SURGICAL PCR SCREEN     Status: None  Collection Time    10/16/13  6:20 PM      Result Value Ref Range Status   MRSA, PCR NEGATIVE  NEGATIVE Final   Staphylococcus aureus NEGATIVE  NEGATIVE Final   Comment:            The Xpert SA Assay (FDA     approved for NASAL specimens     in patients over 93 years of age),     is one component of     a comprehensive surveillance     program.  Test performance has     been validated by [employer] for patients greater     than or equal to 40 year old.     It is not intended     to diagnose infection nor to     guide or monitor treatment.  CULTURE, ROUTINE-ABSCESS     Status: None   Collection Time    10/16/13  8:42 PM      Result Value Ref Range Status   Specimen Description ABSCESS   Final   Special Requests EPIDURAL ABSCESS SPECIMEN NO 1   Final   Gram Stain     Final   Value: MODERATE WBC PRESENT,BOTH PMN AND MONONUCLEAR     NO SQUAMOUS EPITHELIAL CELLS SEEN     RARE GRAM NEGATIVE RODS     Performed at [facility]   Culture     Final   Value: FEW GRAM NEGATIVE RODS     Performed at [facility]   Report Status PENDING   Incomplete  CULTURE, ROUTINE-ABSCESS      Status: None   Collection Time    10/16/13  8:44 PM      Result Value Ref Range Status   Specimen Description ABSCESS   Final   Special Requests EPIDURAL LUMBAR ABSCESS SPECIMEN NO 2   Final   Gram Stain     Final   Value: RARE WBC PRESENT, PREDOMINANTLY MONONUCLEAR     NO SQUAMOUS EPITHELIAL CELLS SEEN     NO ORGANISMS SEEN     Performed at [facility]   Culture     Final   Value: FEW GRAM NEGATIVE RODS     Performed at [facility]   Report Status PENDING   Incomplete    Luanne Bras, MD PGY-2, Internal Medicine Pager: [phone] 10/18/2013, 12:16 PM  Attending addendum: Ms. Vickers is a severe gram-negative rod epidural abscess is now been drained. She's had some clinical improvement and marked resolution of her low back pain. I agree with continuing oral levofloxacin pending final culture results in antibiotic susceptibility testing. If her abscess isolate is sensitive to fluoroquinolone antibiotics she can be managed with 6 weeks of oral levofloxacin. We will follow with you.  Michel Bickers, MD [facility] for [facility] [phone] pager   [phone] cell 10/18/2013, 2:27 PM

## 2013-10-18 NOTE — Progress Notes (Addendum)
ANTIBIOTIC CONSULT NOTE - Follow-up  Pharmacy Consult for vancomycin  Indication: epidural abcess and drainage  Allergies  Allergen Reactions  . Peanut-Containing Drug Products Nausea And Vomiting    Patient Measurements: Height: 5\' 6"  (167.6 cm) Weight: 220 lb 10.9 oz (100.1 kg) IBW/kg (Calculated) : 59.3 Adjusted Body Weight: 75 kg  Vital Signs: Temp: 98.3 F (36.8 C) (08/14 0516) Temp src: Oral (08/14 0516) BP: 101/58 mmHg (08/14 0516) Pulse Rate: 72 (08/14 0516) Intake/Output from previous day: 08/13 0701 - 08/14 0700 In: 240 [P.O.:240] Out: 1955 [Urine:1815; Drains:140] Intake/Output from this shift:    Labs:  Recent Labs  10/16/13 0412 10/17/13 1200 10/18/13 0945  WBC  --  14.5* 13.2*  HGB  --  10.3* 9.6*  PLT  --  338 307  CREATININE 1.87* 1.43* 1.35*   Estimated Creatinine Clearance: 41.7 ml/min (by C-G formula based on Cr of 1.35). No results found for this basename: Letta Median, VANCORANDOM, GENTTROUGH, GENTPEAK, GENTRANDOM, TOBRATROUGH, TOBRAPEAK, TOBRARND, AMIKACINPEAK, AMIKACINTROU, AMIKACIN,  in the last 72 hours   Microbiology: Recent Results (from the past 720 hour(s))  URINE CULTURE     Status: None   Collection Time    10/12/13 10:42 AM      Result Value Ref Range Status   Specimen Description URINE, CATHETERIZED   Final   Special Requests Normal   Final   Culture  Setup Time     Final   Value: 10/12/2013 13:40     Performed at [place]     Final   Value: >=100,000 COLONIES/ML     Performed at [facility]   Culture     Final   Value: ESCHERICHIA COLI     Performed at [facility]   Report Status 10/14/2013 FINAL   Final   Organism ID, Bacteria ESCHERICHIA COLI   Final  SURGICAL PCR SCREEN     Status: None   Collection Time    10/16/13  6:20 PM      Result Value Ref Range Status   MRSA, PCR NEGATIVE  NEGATIVE Final   Staphylococcus aureus NEGATIVE  NEGATIVE Final   Comment:             The Xpert SA Assay (FDA     approved for NASAL specimens     in patients over 76 years of age),     is one component of     a comprehensive surveillance     program.  Test performance has     been validated by [employer] for patients greater     than or equal to 9 year old.     It is not intended     to diagnose infection nor to     guide or monitor treatment.  CULTURE, ROUTINE-ABSCESS     Status: None   Collection Time    10/16/13  8:42 PM      Result Value Ref Range Status   Specimen Description ABSCESS   Final   Special Requests EPIDURAL ABSCESS SPECIMEN NO 1   Final   Gram Stain     Final   Value: MODERATE WBC PRESENT,BOTH PMN AND MONONUCLEAR     NO SQUAMOUS EPITHELIAL CELLS SEEN     RARE GRAM NEGATIVE RODS     Performed at [facility]   Culture     Final   Value: [place]     Performed  at [facility]   Report Status PENDING   Incomplete  CULTURE, ROUTINE-ABSCESS     Status: None   Collection Time    10/16/13  8:44 PM      Result Value Ref Range Status   Specimen Description ABSCESS   Final   Special Requests EPIDURAL LUMBAR ABSCESS SPECIMEN NO 2   Final   Gram Stain     Final   Value: RARE WBC PRESENT, PREDOMINANTLY MONONUCLEAR     NO SQUAMOUS EPITHELIAL CELLS SEEN     NO ORGANISMS SEEN     Performed at [facility]   Culture     Final   Value: FEW GRAM NEGATIVE RODS     Performed at [facility]   Report Status PENDING   Incomplete    Assessment: Pt on Vancomycin (Day #2) for epidural abscess with hemovac drain. S/p laminectomy and abscess drainage 8/12. Pt continues on po levaquin (Day #5) for Ecoli UTI. Rocephin was d/c yesterday. WBC trending down. Afebrile. Noted ID consulted. 0600 Vancomycin dose not given this a.m.   Levaquin 8/10 >> CTX 8/13 >>8/13 Vanc 8/13 >>  8/12 Epidural abscess cx x2>>few GNR 8/8 Urine >100K Ecoli - pan sensitive  Goal of Therapy:  Vancomycin trough level 15-20  mcg/ml  Plan:  1) Change Vancomycin to 750mg  IV q12h. 2) Will f/u renal function, micro data, pt's clinical condition 3) Vanc trough prn  Sherlon Handing, PharmD, BCPS Clinical pharmacist, pager [phone] 10/18/2013,1:13 PM

## 2013-10-18 NOTE — Progress Notes (Signed)
Postop day 2. Patient overall stable. Back pain and lower chamois symptoms remain greatly improved. Motor and sensory examination still slowly improving. Cultures with gram-negative rods present. Identification pending. Given diabetes this is certainly a concern. Patient currently on vancomycin and Rocephin. I would recommend infectious disease evaluation and antibiotic recommendations.

## 2013-10-18 NOTE — Progress Notes (Signed)
NUTRITION FOLLOW UP  Intervention:   -D/c Glucerna, due to poor acceptance -[facility] Breeze po TID, each supplement provides 250 kcal and 9 grams of protein  Nutrition Dx:   Inadequate oral intake related to pyelonephritis as evidenced by 0% meal completion; ongoing  Goal:   Pt to meet >/= 90% of their estimated nutrition needs; goal not met  Monitor:   Weight trends, po intake, labs  Assessment:   80 y/o ? known IDDM Ty 2 since ~2005, Htn, history fatty liver on ursodiol came to the ED with a three-week history of on-and-off fevers. Ct showed Scoliosis and DDD-also hyponatremia 123, and AKI. Was started on Rocephin.  Pt s/p L2-L5 decompressive laminectomy and evacuation of abscess on 10/16/13. Continues on IV antibiotics due to severe gram-negative rod epidural abscess. ID consult completed. Discharge disposition is for SNF when medically stable. Pt working with therapy at time of visit. Appetite continues to be poor. PO: 0-25%. Lunch tray at bedside was untouched. Also noted pt is refusing Glucerna shake. Will d/c due to poor acceptance. Noted 3.8% wt gain x 4 days, likely due to edema. Noted orders for lactulose.  Labs reviewed. CBGs have increased:126-267. NA: 134. BUN/Creat elevated but improving.   Height: Ht Readings from Last 1 Encounters:  10/14/13 5' 6"  (1.676 m)    Weight Status:   Wt Readings from Last 1 Encounters:  10/18/13 220 lb 10.9 oz (100.1 kg)    Re-estimated needs:  Kcal: 6294-7654 Protein: 110-120 grams Fluid: 1.5-1.7 L  Skin: closed back incision, LUE and RLE edema  Diet Order: Carb Control   Intake/Output Summary (Last 24 hours) at 10/18/13 1433 Last data filed at 10/18/13 0520  Gross per 24 hour  Intake      0 ml  Output   1240 ml  Net  -1240 ml    Last BM: 10/17/13   Labs:   Recent Labs Lab 10/16/13 0412 10/17/13 1200 10/18/13 0945  NA 130* 134* 134*  K 4.3 4.3 3.3*  CL 101 102 107  CO2 15* 14* 17*  BUN 51* 45* 42*  CREATININE  1.87* 1.43* 1.35*  CALCIUM 8.7 8.3* 8.4  GLUCOSE 200* 290* 164*    CBG (last 3)   Recent Labs  10/17/13 2103 10/18/13 0757 10/18/13 1151  GLUCAP 267* 186* 126*    Scheduled Meds: . antiseptic oral rinse  1 application Mouth Rinse QID  . buPROPion  300 mg Oral Daily  . chlorhexidine  15 mL Mouth/Throat BID  . famotidine  40 mg Oral Daily  . feeding supplement (GLUCERNA SHAKE)  237 mL Oral BID BM  . gabapentin  300 mg Oral QHS  . heparin subcutaneous  5,000 Units Subcutaneous 3 times per day  . insulin aspart  0-15 Units Subcutaneous TID WC  . insulin aspart  3 Units Subcutaneous TID WC  . insulin detemir  12 Units Subcutaneous QHS  . lactulose  20 g Oral BID  . levofloxacin  500 mg Oral Q48H  . polyethylene glycol  17 g Oral Daily  . sodium bicarbonate  650 mg Oral BID  . ursodiol  300 mg Oral TID    Continuous Infusions:   Deloise Marchant A. Jimmye Norman, RD, LDN Pager: [phone] After hours Pager: [phone]

## 2013-10-18 NOTE — Progress Notes (Signed)
  Echocardiogram 2D Echocardiogram has been performed.  [name] 10/18/2013, 2:06 PM

## 2013-10-18 NOTE — Progress Notes (Signed)
PATIENT DETAILS Name: Tamara Stephenson Age: 80 y.o. Sex: female Date of Birth: 1934/01/12 Admit Date: 10/12/2013 Admitting Physician Nita Sells, MD HCW:[id number],VOH A, MD  Subjective: Patient has no new complaints today. She moved to the [place] from [place] over 50 years ago and has no family members here.   Assessment/Plan: Active Problems: Epidural Abscess-s/p lumbar laminectomy with chronic low back pain - Patient reported progressive back pain with lower extremity weakness limiting her mobility at home for several weeks. She was unable to ambulate. MRI lumbar spine without contrast on 8/12 showed severe L3-L4 spinal stenosis with epidural fluid collection and bilateral Hydronephrosis possibly secondary to cauda equina as per radiologist. [facility] neurosurgery Dr Annette Stable who had pt transferred to [facility] and on 8/12 performed L2-L5 decompressive laminectomy with evacuation of epidural abscess. Wound irrigated, vancomycin powder applied topically and an epidural wound vac placed. Intraoperative cultures continue to be negative so far, Gram stain shows a few gram-negative rods. Currently on vancomycin and Levaquin.Remains afebrile and without any fever.ID consulted, awaiting formal evaluation. Will likely require SNF on discharge.  E Coli UTI (urinary tract infection) -Urine culture positive for E Coli -Remains afebrile and without any fever.Leukocytosis decreasing as well. -Continue empiric levaquin -Denies dysuria or flank pain  Acute Renal Failure -Possibly in the setting of UTI, versus chronic kidney disease secondary to diabetic nephropathy. -Patient was taking regular NSAIDs at home. Off NSAIDs. Improving with hydration-continue to check lytes periodically -Abdominal ultrasound 8/8 demonstrated no hydronephrosis, no nephrolithiasis.   Metabolic acidosis  -Possibly due to ? CKD, RTA -Improving; continue to replinish with sodium bicarbonate tablets TID.    Hyponatremia -significantly improved. Secondary to dehydration.  Bilateral leg edema and elevated pro BNP  -BNP elevated at 1099,-2-D echocardiogram ordered to evaluate LV function.  Diabetes mellitus -CBG's stable- Levemir dose currently 12 units at bedtime and premeal aspart 3 units TID -A1C of 8.7  Hypertension -Controlled, most recently borderline hypotensive at 101/58. Currently not on any antihypertensives. Given lower leg edema, will avoid resuming Amlodipine on discharge.   Anemia -secondary to acute illness -Hgb trending down to 9.6; Hct to 27.4 -TSH within normal limits -B12 ordered  Fatty liver disease, nonalcoholic -Continue ursodiol, monitor.  -Abdominal US 8/8 demonstrated hepatic cirrhosis, no focal mass.    Bilateral adrenal adenomas -Noted on CT abdomen on 8/8 -Refer to urology; Need to have this reviewed by urologist as an outpatient   Porcelain gallbladder -Abdominal ultrasound 8/8 demonstrated porcelain gallbladder, no definite stones identified and no evidence  for acute cholecystitis.Belly soft on exam. -Suggest outpatient GI follow-up for surgical assessment  Disposition: Remain inpatient  DVT Prophylaxis: Start SQ Heparin  Code Status: Full code  Family Communication None  Procedures:  None  CONSULTS:  Neurosurgery, ID  Time spent 40 minutes-which includes 50% of the time with face-to-face with patient/ family and coordinating care related to the above assessment and plan.  MEDICATIONS: Scheduled Meds: . antiseptic oral rinse  1 application Mouth Rinse QID  . buPROPion  300 mg Oral Daily  . chlorhexidine  15 mL Mouth/Throat BID  . famotidine  40 mg Oral Daily  . feeding supplement (GLUCERNA SHAKE)  237 mL Oral BID BM  . gabapentin  300 mg Oral QHS  . insulin aspart  0-15 Units Subcutaneous TID WC  . insulin aspart  3 Units Subcutaneous TID WC  . insulin detemir  12 Units Subcutaneous QHS  . lactulose  20 g Oral BID  .  levofloxacin  500 mg Oral Q48H  . polyethylene glycol  17 g Oral Daily  . sodium bicarbonate  650 mg Oral BID  . ursodiol  300 mg Oral TID  . vancomycin  1,000 mg Intravenous Q24H   Continuous Infusions:  PRN Meds:.HYDROmorphone (DILAUDID) injection, morphine injection, oxyCODONE  Antibiotics: Anti-infectives   Start     Dose/Rate Route Frequency Ordered Stop   10/17/13 0600  cefTRIAXone (ROCEPHIN) 2 g in dextrose 5 % 50 mL IVPB  Status:  Discontinued     2 g 100 mL/hr over 30 Minutes Intravenous Every 12 hours 10/17/13 0211 10/17/13 1602   10/17/13 0600  vancomycin (VANCOCIN) IVPB 1000 mg/200 mL premix     1,000 mg 200 mL/hr over 60 Minutes Intravenous Every 24 hours 10/17/13 0234     10/17/13 0211  cefTRIAXone (ROCEPHIN) 2 g in dextrose 5 % 50 mL IVPB  Status:  Discontinued     2 g 100 mL/hr over 30 Minutes Intravenous 30 min pre-op 10/17/13 0211 10/17/13 0224   10/17/13 0211  vancomycin (VANCOCIN) 1,500 mg in sodium chloride 0.9 % 500 mL IVPB  Status:  Discontinued     1,500 mg 250 mL/hr over 120 Minutes Intravenous 120 min pre-op 10/17/13 0211 10/17/13 0224   10/16/13 2104  vancomycin (VANCOCIN) powder  Status:  Discontinued       As needed 10/16/13 2105 10/16/13 2238   10/16/13 2102  vancomycin (VANCOCIN) 1000 MG powder    Comments:  Aydelette, Jamie   : cabinet override      10/16/13 2102 10/17/13 0914   10/16/13 2022  bacitracin 50,000 Units in sodium chloride irrigation 0.9 % 500 mL irrigation  Status:  Discontinued       As needed 10/16/13 2030 10/16/13 2238   10/16/13 2000  cefTRIAXone (ROCEPHIN) 1 g in dextrose 5 % 50 mL IVPB     1 g 100 mL/hr over 30 Minutes Intravenous To Surgery 10/16/13 1957 10/16/13 2043   10/16/13 1954  vancomycin (VANCOCIN) 1 GM/200ML IVPB    Comments:  Foley, Roger   : cabinet override      10/16/13 1954 10/16/13 2058   10/14/13 1000  levofloxacin (LEVAQUIN) tablet 500 mg     500 mg Oral Every 48 hours 10/14/13 0737     10/13/13 1200   cefTRIAXone (ROCEPHIN) 1 g in dextrose 5 % 50 mL IVPB  Status:  Discontinued     1 g 100 mL/hr over 30 Minutes Intravenous Every 24 hours 10/12/13 1325 10/14/13 0737   10/12/13 1045  cefTRIAXone (ROCEPHIN) 1 g in dextrose 5 % 50 mL IVPB  Status:  Discontinued     1 g 100 mL/hr over 30 Minutes Intravenous  Once 10/12/13 1038 10/12/13 1256     PHYSICAL EXAM: Vital signs in last 24 hours: Filed Vitals:   10/17/13 1700 10/17/13 1825 10/17/13 2111 10/18/13 0516  BP:  105/60 148/67 101/58  Pulse: 70 82 73 72  Temp:  97.5 F (36.4 C) 97.4 F (36.3 C) 98.3 F (36.8 C)  TempSrc:  Oral Oral Oral  Resp: 12 14 16 16   Height:      Weight:    100.1 kg (220 lb 10.9 oz)  SpO2: 98% 100% 98% 96%    Weight change: -1.3 kg (-2 lb 13.9 oz) Filed Weights   10/15/13 0532 10/17/13 0200 10/18/13 0516  Weight: 101.197 kg (223 lb 1.6 oz) 101.4 kg (223 lb 8.7 oz) 100.1 kg (220 lb 10.9 oz)  Body mass index is 35.64 kg/(m^2).   Gen Exam: Awake and alert with clear speech, [place] accent. Neck: Supple, No JVD.   Chest: B/L Clear.   CVS: S1 S2 Regular, no murmurs.  Abdomen: soft, BS +, non tender, non distended.  Extremities: 1+ pitting edema, lower extremities warm to touch. Right leg with decreased strength compared to left. Neurologic: Non Focal.  Skin: No Rash.    Intake/Output from previous day:  Intake/Output Summary (Last 24 hours) at 10/18/13 1041 Last data filed at 10/18/13 0520  Gross per 24 hour  Intake    240 ml  Output   1955 ml  Net  -1715 ml    LAB RESULTS: CBC  Recent Labs Lab 10/13/13 0525 10/14/13 0415 10/15/13 0427 10/17/13 1200 10/18/13 0945  WBC 16.3* 15.0* 14.3* 14.5* 13.2*  HGB 11.7* 12.0 11.4* 10.3* 9.6*  HCT 33.4* 32.7* 32.9* 29.2* 27.4*  PLT 254 294 296 338 307  MCV 95.7 95.9 96.8 97.7 95.8  MCH 33.5 35.2* 33.5 34.4* 33.6  MCHC 35.0 36.7* 34.7 35.3 35.0  RDW 14.0 14.3 14.6 15.4 15.4  LYMPHSABS  --  1.1 2.1  --   --   MONOABS  --  1.0 1.3*  --   --    EOSABS  --  0.0 0.1  --   --   BASOSABS  --  0.0 0.0  --   --    Chemistries   Recent Labs Lab 10/14/13 0415 10/14/13 1027 10/15/13 0427 10/16/13 0412 10/17/13 1200  NA 130* 131* 130* 130* 134*  K 4.0 3.9 3.6* 4.3 4.3  CL 98 98 99 101 102  CO2 15* 18* 18* 15* 14*  GLUCOSE 182* 137* 61* 200* 290*  BUN 39* 42* 48* 51* 45*  CREATININE 1.35* 1.54* 1.98* 1.87* 1.43*  CALCIUM 8.5 8.6 8.7 8.7 8.3*   CBG:  Recent Labs Lab 10/17/13 0742 10/17/13 1233 10/17/13 1757 10/17/13 2103 10/18/13 0757  GLUCAP 297* 276* 245* 267* 186*   GFR Estimated Creatinine Clearance: 39.3 ml/min (by C-G formula based on Cr of 1.43).  Coagulation profile  Recent Labs Lab 10/13/13 0525  INR 1.13    Recent Labs  10/16/13 1615  TSH 1.240   MICROBIOLOGY: Recent Results (from the past 240 hour(s))  URINE CULTURE     Status: None   Collection Time    10/12/13 10:42 AM      Result Value Ref Range Status   Specimen Description URINE, CATHETERIZED   Final   Special Requests Normal   Final   Culture  Setup Time     Final   Value: 10/12/2013 13:40     Performed at [facility]     Final   Value: >=100,000 COLONIES/ML     Performed at [facility]   Culture     Final   Value: ESCHERICHIA COLI     Performed at [facility]   Report Status 10/14/2013 FINAL   Final   Organism ID, Bacteria ESCHERICHIA COLI   Final  SURGICAL PCR SCREEN     Status: None   Collection Time    10/16/13  6:20 PM      Result Value Ref Range Status   MRSA, PCR NEGATIVE  NEGATIVE Final   Staphylococcus aureus NEGATIVE  NEGATIVE Final   Comment:            The Xpert SA Assay (FDA     approved for NASAL specimens  in patients over 6 years of age),     is one component of     a comprehensive surveillance     program.  Test performance has     been validated by [employer] for patients greater     than or equal to 75 year old.     It is not intended     to  diagnose infection nor to     guide or monitor treatment.  CULTURE, ROUTINE-ABSCESS     Status: None   Collection Time    10/16/13  8:42 PM      Result Value Ref Range Status   Specimen Description ABSCESS   Final   Special Requests EPIDURAL ABSCESS SPECIMEN NO 1   Final   Gram Stain     Final   Value: MODERATE WBC PRESENT,BOTH PMN AND MONONUCLEAR     NO SQUAMOUS EPITHELIAL CELLS SEEN     RARE GRAM NEGATIVE RODS     Performed at [facility]   Culture     Final   Value: FEW GRAM NEGATIVE RODS     Performed at [facility]   Report Status PENDING   Incomplete  CULTURE, ROUTINE-ABSCESS     Status: None   Collection Time    10/16/13  8:44 PM      Result Value Ref Range Status   Specimen Description ABSCESS   Final   Special Requests EPIDURAL LUMBAR ABSCESS SPECIMEN NO 2   Final   Gram Stain     Final   Value: RARE WBC PRESENT, PREDOMINANTLY MONONUCLEAR     NO SQUAMOUS EPITHELIAL CELLS SEEN     NO ORGANISMS SEEN     Performed at [facility]   Culture     Final   Value: FEW GRAM NEGATIVE RODS     Performed at [facility]   Report Status PENDING   Incomplete    RADIOLOGY STUDIES/RESULTS: Ct Abdomen Pelvis Wo Contrast 10/12/2013   CLINICAL DATA:  Back pain.  EXAM: CT ABDOMEN AND PELVIS WITHOUT CONTRAST  TECHNIQUE: Multidetector CT imaging of the abdomen and pelvis was performed following the standard protocol without IV contrast.  COMPARISON:  None.  FINDINGS: Few linear densities at lung bases are suggestive for atelectasis or scarring. There is no evidence for free intraperitoneal air.  The liver contour is diffusely nodular with mild enlargement of the left hepatic lobe. Findings are compatible with cirrhosis. There is no large or gross liver lesion. The gallbladder wall has diffuse calcifications and question focal calcifications versus a stone at the gallbladder base. The spleen is normal for size.  There is a 2.2 cm low-density lesion in the left  adrenal gland and the Hounsfield units are less than 10. Findings are compatible with a left adrenal adenoma. There may be at least 1 additional adenoma in the left adrenal gland. Small adenoma in the right adrenal gland. Normal appearance of the pancreas, both kidneys and the urinary bladder. No evidence for hydronephrosis or renal stones.  There is severe levoscoliosis in the lumbar spine with multilevel degenerative disease. There is vacuum disc phenomenon with gas bubbles throughout the lumbar spine. No gross abnormality to the uterus or adnexa tissue. There is colonic diverticulosis without acute colonic inflammation. Normal appearance of small bowel without an obstructive process. There is a ventral hernia containing fat.  IMPRESSION: Severe degenerative disease in the lumbar spine with levoscoliosis.  The liver is diffusely nodular  and compatible with cirrhosis. No evidence for ascites or splenomegaly.  Diffuse gallbladder wall calcifications. Findings may represent early stages of a porcelain gallbladder. Difficult to exclude a gallstone at the gallbladder base. The gallbladder could be further characterized with ultrasound.  Bilateral adrenal adenomas.   Electronically Signed   By: Markus Daft M.D.   On: 10/12/2013 10:31   Mr Lumbar Spine Wo Contrast 10/16/2013   CLINICAL DATA:  Acute on chronic low back pain.  EXAM: MRI LUMBAR SPINE WITHOUT CONTRAST  TECHNIQUE: Multiplanar, multisequence MR imaging of the lumbar spine was performed. No intravenous contrast was administered.  COMPARISON:  CT abdomen and pelvis 10/12/2013  FINDINGS: For the purposes of this dictation, the lowest well-formed intervertebral disc space is presumed to be L5-S1.  Prominent S-shaped thoracolumbar scoliosis is present, convex to the left in the lumbar spine. No significant listhesis is seen. Moderate to severe disc space narrowing is seen throughout the lumbar spine, with relative preservation of the L3-4 disc space. Diffuse  degenerative marrow changes are present. No vertebral marrow edema is seen.  There is a dorsal epidural gas and fluid collection extending from L2 to the superior aspect of L4 predominantly in the midline and left of midline. The collection is predominantly fluid, with a small amount of gas superiorly at the L2 level. The amount of gas in the collection has greatly decreased from the recent abdominal CT, however the gas appears to have been replaced by fluid. There is mass effect on the thecal sac, greatest at L2-3 where there is severe spinal stenosis. At the L2 level, the collection measures 13 x 11 mm (transverse by AP). The collection measures 6-6.5 cm in craniocaudal length. A fluid collection posterior to the left L4-5 facet joint measures 11 x 13 x 26 mm. There is also a 10 x 4 mm fluid collection medial to the left L4-5 facet in the epidural space. There is edema in the left-sided posterior paraspinal soft tissues of the mid to lower lumbar spine, greatest about the left L4-5 facet joint. There is also moderate marrow edema about the left L4-5 facet joint.  The tip of the conus medullaris is not clearly localized but is likely near the L1-2 disc space level. There is marked distention of the bladder, incompletely visualized. There is moderate bilateral hydroureteronephrosis.  T11-12: Disc bulge, osteophytic ridging, and moderate facet hypertrophy result in mild to moderate spinal stenosis and mild left neural foraminal stenosis.  T12-L1: Circumferential disc bulge, osteophytic ridging, and moderate facet hypertrophy result in moderate spinal stenosis, left greater than right lateral recess stenosis, and mild left neural foraminal stenosis.  L1-2: Circumferential disc bulge, osteophytic ridging, and moderate to severe facet hypertrophy result in mild spinal stenosis and mild right neural foraminal stenosis.  L2-3: Severe spinal stenosis predominantly due to the dorsal epidural fluid collection. There is disc  bulging and osteophytic ridging asymmetric to the right along with moderate to severe right greater than left facet hypertrophy, resulting in moderate right neural foraminal stenosis.  L3-4: Severe spinal stenosis due to the dorsal epidural fluid collection, mild disc bulging, and severe facet hypertrophy. Mild right neural foraminal stenosis.  L4-5: Mild spinal stenosis due to osteophytic ridging, left lateral epidural fluid collection/synovial cyst, and moderate facet hypertrophy. No significant neural foraminal stenosis.  L5-S1: Left greater than right lateral recess stenosis due to disc bulging, osteophytic ridging, and moderate to severe facet hypertrophy. Mild right and moderate left neural foraminal stenosis.  IMPRESSION: 1. Epidural gas and  fluid collection extending from L2 -L4. This contributes to severe spinal stenosis at L2-3 and L3-4 with complete effacement of the thecal sac. The amount of gas has decreased from the prior CT but the amount of fluid appears to have increased. This is concerning for epidural abscess. 2. Small fluid collections adjacent to the left L4-5 facet joint with surrounding marrow and soft tissue edema. These may reflect acute on chronic facet arthritis and synovial cysts, however septic facet arthritis with associated abscesses is also of concern, particularly given the epidural collection. 3. Marked distention of the bladder with bilateral hydroureteronephrosis. This could potentially reflect urinary retention due to mass effect on the cauda equina. 4. Thoracolumbar scoliosis with diffuse, severe disc degeneration and facet arthrosis resulting in spinal canal and neural foraminal stenosis as above. These results were called by telephone at the time of interpretation on 10/16/2013 at 5:00 pm to Dr. Louellen Molder , who verbally acknowledged these results.   Electronically Signed   By: Logan Bores   On: 10/16/2013 17:04   US Abdomen Complete 10/12/2013   CLINICAL DATA:  Liver,  renal pathology. Abnormal CT. Abdominal pain and flank pain. Elevated alkaline phosphatase and bilirubin.  EXAM: ULTRASOUND ABDOMEN COMPLETE  COMPARISON:  CT of the abdomen and pelvis on 10/12/2013  FINDINGS: Gallbladder:  The gallbladder wall is calcified. Gallbladder wall is 2 mm in thickness. No definite stones are identified. No pericholecystic fluid or sonographic Murphy sign.  Common bile duct:  Diameter: Estimated to be 5 mm. The common bile duct is difficult to visualize in its entire course.  Liver:  The liver appears heterogeneous and nodular, consistent with cirrhosis. No focal liver lesions are identified.  IVC:  The intrahepatic inferior vena cava is not well seen. Visualized portion has a normal appearance.  Pancreas:  Pancreatic tail is not well seen. The visualized portion is normal in appearance.  Spleen:  6.9 cm and normal in appearance.  Right Kidney:  Length: 9.6 cm. Echogenicity within normal limits. No mass or hydronephrosis visualized.  Left Kidney:  Length: 11.7 cm. Echogenicity within normal limits. No mass or hydronephrosis visualized.  Abdominal aorta:  Visualized portion not aneurysmal.  Other findings:  Study is technically difficult because of patient body habitus.  IMPRESSION: 1. Findings consistent with hepatic cirrhosis. No focal mass identified. 2. Porcelain gallbladder. No definite stones identified. No evidence for acute cholecystitis. 3. No hydronephrosis. 4. Difficult exam because of patient body habitus.   Electronically Signed   By: Shon Hale M.D.   On: 10/12/2013 14:19   Dg Lumbar Spine 1 View 10/16/2013   CLINICAL DATA:  Epidural abscess.  EXAM: LUMBAR SPINE - 1 VIEW  COMPARISON:  MRI dated 10/16/2013  FINDINGS: There is an instrument at the L3-4 level.  IMPRESSION: Instrument at L3-4.   Electronically Signed   By: Rozetta Nunnery M.D.   On: 10/16/2013 20:38   Dg Chest Port 1 View 10/17/2013   CLINICAL DATA:  Endotracheal tube placement.  EXAM: PORTABLE CHEST - 1 VIEW   COMPARISON:  None.  FINDINGS: The patient's endotracheal tube is seen ending 5 cm above the carina.  The lungs are hypoexpanded. Bibasilar airspace opacities likely reflect atelectasis. Pulmonary vascularity is at the upper limits of normal. No definite pleural effusion or pneumothorax is seen.  The cardiomediastinal silhouette is enlarged. No acute osseous abnormalities are identified.  IMPRESSION: 1. Endotracheal tube seen ending 5 cm above the carina. 2. Lungs hypoexpanded. Bibasilar airspace opacities likely reflect atelectasis. 3.  Cardiomegaly.   Electronically Signed   By: Garald Balding M.D.   On: 10/17/2013 02:19    Audelia Acton, PA-S [facility] Pager:[phone]  If 7PM-7AM, please contact night-coverage [email or web address] Password [email or web address] 10/18/2013, 10:41 AM   LOS: 6 days   **Disclaimer: This note may have been dictated with voice recognition software. Similar sounding words can inadvertently be transcribed and this note may contain transcription errors which may not have been corrected upon publication of note.**   Attending Patient was seen, examined,treatment plan was discussed with the Physician extender. I have directly reviewed the clinical findings, lab, imaging studies and management of this patient in detail. I have made the necessary changes to the above noted documentation, and agree with the documentation, as recorded by the Physician extender.  Nena Alexander MD [facility].

## 2013-10-18 NOTE — Progress Notes (Signed)
PT Cancellation Note  Patient Details Name: Tamara Stephenson MRN: [id number] DOB: Nov 17, 1933   Cancelled Treatment:    Reason Eval/Treat Not Completed: Patient at procedure or test/unavailable Pt in room about to get ECHO. Will follow up later in PM if time allows or 8/15.   Candy Sledge A 10/18/2013, 1:25 PM Candy Sledge, [place], DPT [phone]

## 2013-10-18 NOTE — Progress Notes (Signed)
Physical Therapy Treatment Patient Details Name: Tamara Stephenson MRN: [id number] DOB: [birth year] Today's Date: 10/18/2013    History of Present Illness 80 yo female admitted with DM, back pain, inability to walk. Pt underwent  10/16/13 laminectomy L2-4. Pt dx with UTI and severe back pain.  Hx of DM, back pain, HTN. Pt is from [facility]    PT Comments    Patient's mobility worsened today compared to previous session secondary to increased pain in back. Required maximal encouragement to participate in therapy. Reviewed back precautions - pt unable to recall from yesterday. Demonstrated poor sitting balance. Pt with lack of effort provided today during attempted standing/bed mobility. Declined transfer to chair. Will continue to follow and progress as tolerated.   Follow Up Recommendations  SNF;Supervision/Assistance - 24 hour     Equipment Recommendations  None recommended by PT    Recommendations for Other Services       Precautions / Restrictions Precautions Precautions: Fall;Back Precaution Comments: reviewed back precautions with patient for comfort with mobility Restrictions Weight Bearing Restrictions: No    Mobility  Bed Mobility Overal bed mobility: Needs Assistance;+2 for physical assistance Bed Mobility: Rolling;Sidelying to Sit;Sit to Sidelying Rolling: +2 for physical assistance;Max assist Sidelying to sit: Total assist;+2 for physical assistance     Sit to sidelying: Total assist;+2 for physical assistance General bed mobility comments: Required constant VC for sequencing and hand placement. Required coaxing and encouragment to participate. Assist mobilizing BLEs to EOB, with bottom and with trunk to get to/from supine.  Transfers Overall transfer level: Needs assistance Equipment used: Rolling walker (2 wheeled);2 person hand held assist Transfers: Sit to/from Stand Sit to Stand: Total assist;+2 physical assistance;From elevated surface          General transfer comment: Unable to clear bottom x3 with total A of 2 from elevated bed height with 2 person hand held assist. Lack of effort.  Able to perform 3/4 standing with total A of 2 and use of RW. VC for technique and hand placement. Manual cues for hip extension and upright posture. Questionable effort? Increasec fatigue and pain in back.  Ambulation/Gait                 Stairs            Wheelchair Mobility    Modified Rankin (Stroke Patients Only)       Balance Overall balance assessment: Needs assistance   Sitting balance-Leahy Scale: Poor Sitting balance - Comments: Required Mod A to maintain balance EOB and UE support for stability; multiple LOB without support requiring Max A to prevent fall. Short instances of Min guard assist with RW for UE support.   Standing balance support: Bilateral upper extremity supported;During functional activity Standing balance-Leahy Scale: Zero                      Cognition Arousal/Alertness: Awake/alert Behavior During Therapy: Anxious Overall Cognitive Status: Within Functional Limits for tasks assessed                      Exercises General Exercises - Lower Extremity Ankle Circles/Pumps: Both;10 reps;Supine    General Comments General comments (skin integrity, edema, etc.): hemovac intact.      Pertinent Vitals/Pain Pain Score: 6  Pain Location: low back Pain Descriptors / Indicators: Sharp;Sore;Discomfort;Tender Pain Intervention(s): Limited activity within patient's tolerance;Monitored during session;Repositioned    Home Living  Prior Function            PT Goals (current goals can now be found in the care plan section) Progress towards PT goals: Progressing toward goals    Frequency  Min 3X/week    PT Plan Current plan remains appropriate    Co-evaluation             End of Session Equipment Utilized During Treatment: Gait belt Activity  Tolerance: Patient limited by fatigue;Patient limited by pain Patient left: in bed;with call bell/phone within reach;with bed alarm set     Time: 1610-9604 PT Time Calculation (min): 30 min  Charges:  $Therapeutic Activity: 8-22 mins $Neuromuscular Re-education: 8-22 mins                    G CodesCandy Sledge A 11/09/2013, 3:30 PM Candy Sledge, [place], DPT [phone]

## 2013-10-19 DIAGNOSIS — D649 Anemia, unspecified: Secondary | ICD-10-CM

## 2013-10-19 DIAGNOSIS — G061 Intraspinal abscess and granuloma: Principal | ICD-10-CM

## 2013-10-19 LAB — BASIC METABOLIC PANEL
Anion gap: 15 (ref 5–15)
BUN: 43 mg/dL — ABNORMAL HIGH (ref 6–23)
CO2: 16 mEq/L — ABNORMAL LOW (ref 19–32)
CREATININE: 1.36 mg/dL — AB (ref 0.50–1.10)
Calcium: 9 mg/dL (ref 8.4–10.5)
Chloride: 107 mEq/L (ref 96–112)
GFR calc non Af Amer: 36 mL/min — ABNORMAL LOW (ref >90)
GFR, EST AFRICAN AMERICAN: 42 mL/min — AB (ref >90)
GLUCOSE: 136 mg/dL — AB (ref 70–99)
Potassium: 3.5 mEq/L — ABNORMAL LOW (ref 3.7–5.3)
Sodium: 138 mEq/L (ref 137–147)

## 2013-10-19 LAB — RETICULOCYTES
RBC.: 2.81 MIL/uL — ABNORMAL LOW (ref 3.87–5.11)
RETIC COUNT ABSOLUTE: 98.4 10*3/uL (ref 19.0–186.0)
Retic Ct Pct: 3.5 % — ABNORMAL HIGH (ref 0.4–3.1)

## 2013-10-19 LAB — CBC
HEMATOCRIT: 27.6 % — AB (ref 36.0–46.0)
Hemoglobin: 9.6 g/dL — ABNORMAL LOW (ref 12.0–15.0)
MCH: 34.2 pg — ABNORMAL HIGH (ref 26.0–34.0)
MCHC: 34.8 g/dL (ref 30.0–36.0)
MCV: 98.2 fL (ref 78.0–100.0)
Platelets: 311 10*3/uL (ref 150–400)
RBC: 2.81 MIL/uL — ABNORMAL LOW (ref 3.87–5.11)
RDW: 15.5 % (ref 11.5–15.5)
WBC: 10.1 10*3/uL (ref 4.0–10.5)

## 2013-10-19 LAB — FERRITIN: FERRITIN: 582 ng/mL — AB (ref 10–291)

## 2013-10-19 LAB — CULTURE, ROUTINE-ABSCESS

## 2013-10-19 LAB — FOLATE: Folate: 12.6 ng/mL

## 2013-10-19 LAB — GLUCOSE, CAPILLARY
GLUCOSE-CAPILLARY: 119 mg/dL — AB (ref 70–99)
Glucose-Capillary: 117 mg/dL — ABNORMAL HIGH (ref 70–99)
Glucose-Capillary: 231 mg/dL — ABNORMAL HIGH (ref 70–99)
Glucose-Capillary: 140 mg/dL — ABNORMAL HIGH (ref 70–99)

## 2013-10-19 LAB — IRON AND TIBC
IRON: 108 ug/dL (ref 42–135)
SATURATION RATIOS: 77 % — AB (ref 20–55)
TIBC: 140 ug/dL — AB (ref 250–470)
UIBC: 32 ug/dL — AB (ref 125–400)

## 2013-10-19 LAB — VITAMIN B12: Vitamin B-12: 1942 pg/mL — ABNORMAL HIGH (ref 211–911)

## 2013-10-19 NOTE — Progress Notes (Signed)
[facility]  Date of Admission:  10/12/2013  Antibiotics: levaquin day 6  Subjective: Significant pain  Objective: Temp:  [97.8 F (36.6 C)-98.2 F (36.8 C)] 98.1 F (36.7 C) (08/15 1403) Pulse Rate:  [81-93] 93 (08/15 1403) Resp:  [18] 18 (08/15 1403) BP: (98-116)/(57-71) 98/65 mmHg (08/15 1403) SpO2:  [96 %-97 %] 97 % (08/15 1403) Weight:  [223 lb 5.2 oz (101.3 kg)] 223 lb 5.2 oz (101.3 kg) (08/15 0500)  General: awake, alert, nad Skin: malar rash on face Lungs: CTA B Cor: RRR Abdomen: soft, nt Back: +vac   Lab Results Lab Results  Component Value Date   WBC 10.1 10/19/2013   HGB 9.6* 10/19/2013   HCT 27.6* 10/19/2013   MCV 98.2 10/19/2013   PLT 311 10/19/2013    Lab Results  Component Value Date   CREATININE 1.36* 10/19/2013   BUN 43* 10/19/2013   NA 138 10/19/2013   K 3.5* 10/19/2013   CL 107 10/19/2013   CO2 16* 10/19/2013    Lab Results  Component Value Date   ALT 19 10/13/2013   AST 25 10/13/2013   ALKPHOS 156* 10/13/2013   BILITOT 2.8* 10/13/2013      Microbiology: Recent Results (from the past 240 hour(s))  URINE CULTURE     Status: None   Collection Time    10/12/13 10:42 AM      Result Value Ref Range Status   Specimen Description URINE, CATHETERIZED   Final   Special Requests Normal   Final   Culture  Setup Time     Final   Value: 10/12/2013 13:40     Performed at [facility]     Final   Value: >=100,000 COLONIES/ML     Performed at [facility]   Culture     Final   Value: ESCHERICHIA COLI     Performed at [facility]   Report Status 10/14/2013 FINAL   Final   Organism ID, Bacteria ESCHERICHIA COLI   Final  SURGICAL PCR SCREEN     Status: None   Collection Time    10/16/13  6:20 PM      Result Value Ref Range Status   MRSA, PCR NEGATIVE  NEGATIVE Final   Staphylococcus aureus NEGATIVE  NEGATIVE Final   Comment:            The Xpert SA Assay (FDA     approved for NASAL  specimens     in patients over 51 years of age),     is one component of     a comprehensive surveillance     program.  Test performance has     been validated by [employer] for patients greater     than or equal to 25 year old.     It is not intended     to diagnose infection nor to     guide or monitor treatment.  CULTURE, ROUTINE-ABSCESS     Status: None   Collection Time    10/16/13  8:42 PM      Result Value Ref Range Status   Specimen Description ABSCESS   Final   Special Requests EPIDURAL ABSCESS SPECIMEN NO 1   Final   Gram Stain     Final   Value: MODERATE WBC PRESENT,BOTH PMN AND MONONUCLEAR     NO SQUAMOUS EPITHELIAL CELLS SEEN     RARE GRAM NEGATIVE  RODS     Performed at [facility]   Culture     Final   Value: FEW ESCHERICHIA COLI     Performed at [facility]   Report Status 10/19/2013 FINAL   Final   Organism ID, Bacteria ESCHERICHIA COLI   Final  AFB CULTURE WITH SMEAR     Status: None   Collection Time    10/16/13  8:42 PM      Result Value Ref Range Status   Specimen Description ABSCESS   Final   Special Requests EPIDURAL ABSCESS SPECIMEN NO 1   Final   Acid Fast Smear     Final   Value: NO ACID FAST BACILLI SEEN     Performed at [facility]   Culture     Final   Value: CULTURE WILL BE EXAMINED FOR 6 WEEKS BEFORE ISSUING A FINAL REPORT     Performed at [facility]   Report Status PENDING   Incomplete  FUNGUS CULTURE W SMEAR     Status: None   Collection Time    10/16/13  8:42 PM      Result Value Ref Range Status   Specimen Description ABSCESS   Final   Special Requests EPIDURAL ABSCESS NO 1   Final   Fungal Smear     Final   Value: NO YEAST OR FUNGAL ELEMENTS SEEN     Performed at [facility]   Culture     Final   Value: CULTURE IN PROGRESS FOR FOUR WEEKS     Performed at [facility]   Report Status PENDING   Incomplete  CULTURE, ROUTINE-ABSCESS     Status: None   Collection Time     10/16/13  8:44 PM      Result Value Ref Range Status   Specimen Description ABSCESS   Final   Special Requests EPIDURAL LUMBAR ABSCESS SPECIMEN NO 2   Final   Gram Stain     Final   Value: RARE WBC PRESENT, PREDOMINANTLY MONONUCLEAR     NO SQUAMOUS EPITHELIAL CELLS SEEN     NO ORGANISMS SEEN     Performed at [facility]   Culture     Final   Value: FEW ESCHERICHIA COLI     Performed at [facility]   Report Status 10/19/2013 FINAL   Final   Organism ID, Bacteria ESCHERICHIA COLI   Final    Studies/Results: No results found.  Assessment/Plan: 1) epidural abscess s/p laminectomy and evacuation of epidural abscess - E coli positive, sensitive to cipro.  Will continue with oral levaquin at high dose, renally dosed for 6 weeks through at least .  Would repeat MRI in 5-6 weeks unless done by Dr. Trenton Gammon already by that time.  She states she does not want to go to rehab, will do it at home.   We will arrange follow up with ID in about 2-3 weeks.   Will check baseline ESR, CRP  I will sign off, thanks  Scharlene Gloss, [place] [place] [email or web address] [phone] pager   [phone] cell 10/19/2013, 3:53 PM

## 2013-10-19 NOTE — Plan of Care (Signed)
Problem: Consults Goal: Diagnosis-Diabetes Mellitus Outcome: Completed/Met Date Met:  10/19/13 Hyperglycemia

## 2013-10-19 NOTE — Progress Notes (Signed)
PATIENT DETAILS Name: Tamara Stephenson Age: 80 y.o. Sex: female Date of Birth: 10-28-1933 Admit Date: 10/12/2013 Admitting Physician Nita Sells, MD QMV:[id number],WUX A, MD  Subjective: Patient has no new complaints today.    Assessment/Plan: Active Problems: Epidural Abscess-s/p lumbar laminectomy with chronic low back pain - Patient reported progressive back pain with lower extremity weakness limiting her mobility at home for several weeks. She was unable to ambulate. MRI lumbar spine without contrast on 8/12 showed severe L3-L4 spinal stenosis with epidural fluid collection and bilateral Hydronephrosis possibly secondary to cauda equina as per radiologist. [facility] neurosurgery Dr Annette Stable who had pt transferred to [facility] and on 8/12 performed L2-L5 decompressive laminectomy with evacuation of epidural abscess. Wound irrigated, vancomycin powder applied topically and an epidural wound vac placed. Intraoperative cultures continue to be negative so far, but gram stain shows a few gram-negative rods. Seen by infectious disease, current recommendations are to continue with Levaquin-day 6. Remains afebrile and without any fever. Will likely require SNF on discharge. Fortunately has improved in a motor strength of her lower extremities, right lower extremity 3/5, left lower extremity 4/5.  E Coli UTI (urinary tract infection) -Urine culture positive for E Coli -Remains afebrile and without any fever.Leukocytosis decreasing as well. -Continue empiric levaquin -Denies dysuria or flank pain  Acute Renal Failure -Possibly in the setting of UTI, versus chronic kidney disease secondary to diabetic nephropathy. -Patient was taking regular NSAIDs at home. Off NSAIDs. Improving with hydration-continue to check lytes periodically -Abdominal ultrasound 8/8 demonstrated no hydronephrosis, no nephrolithiasis.   Metabolic acidosis  -Possibly due to ? CKD, RTA -Improving; continue to replinish with  sodium bicarbonate tablets TID.   Hyponatremia -resolved. Secondary to dehydration.  Chronic Diastolic CHF  -BNP elevated at 1099,-2-D echocardiogram shows preserved EF-but does show diastolic dysfunction. Mostly compensated-but does have mild lower ext edema. Will start gently diuresis over the next few days  Diabetes mellitus -CBG's stable- Levemir dose currently 12 units at bedtime and premeal aspart 3 units TID -A1C of 8.7  Hypertension -Controlled, most recently borderline hypotensive at 101/58. Currently not on any antihypertensives. Given lower leg edema, will avoid resuming Amlodipine on discharge.   Anemia -secondary to acute illness -Hgb stable at 9.6-monitor periodically  Fatty liver disease, nonalcoholic -Continue ursodiol, monitor.  -Abdominal US 8/8 demonstrated hepatic cirrhosis, no focal mass.    Bilateral adrenal adenomas -Noted on CT abdomen on 8/8 -Refer to urology; Need to have this reviewed by urologist as an outpatient   Porcelain gallbladder -Abdominal ultrasound 8/8 demonstrated porcelain gallbladder, no definite stones identified and no evidence  for acute cholecystitis.Belly soft on exam. -Suggest outpatient GI follow-up for surgical assessment  Disposition: Remain inpatient  DVT Prophylaxis: Start SQ Heparin  Code Status: Full code  Family Communication None  Procedures:  None  CONSULTS:  Neurosurgery, ID  Time spent 40 minutes-which includes 50% of the time with face-to-face with patient/ family and coordinating care related to the above assessment and plan.  MEDICATIONS: Scheduled Meds: . antiseptic oral rinse  1 application Mouth Rinse QID  . buPROPion  300 mg Oral Daily  . chlorhexidine  15 mL Mouth/Throat BID  . famotidine  40 mg Oral Daily  . feeding supplement (RESOURCE BREEZE)  1 Container Oral TID BM  . gabapentin  300 mg Oral QHS  . heparin subcutaneous  5,000 Units Subcutaneous 3 times per day  . insulin aspart  0-15  Units Subcutaneous TID WC  . insulin  aspart  3 Units Subcutaneous TID WC  . insulin detemir  12 Units Subcutaneous QHS  . lactulose  20 g Oral BID  . [START ON 10/20/2013] levofloxacin  750 mg Oral Q48H  . polyethylene glycol  17 g Oral Daily  . sodium bicarbonate  650 mg Oral BID  . ursodiol  300 mg Oral TID   Continuous Infusions:  PRN Meds:.HYDROmorphone (DILAUDID) injection, morphine injection, oxyCODONE  Antibiotics: Anti-infectives   Start     Dose/Rate Route Frequency Ordered Stop   10/20/13 1000  levofloxacin (LEVAQUIN) tablet 750 mg     750 mg Oral Every 48 hours 10/18/13 1534     10/18/13 1400  vancomycin (VANCOCIN) IVPB 750 mg/150 ml premix  Status:  Discontinued     750 mg 150 mL/hr over 60 Minutes Intravenous Every 12 hours 10/18/13 1310 10/18/13 1428   10/17/13 0600  cefTRIAXone (ROCEPHIN) 2 g in dextrose 5 % 50 mL IVPB  Status:  Discontinued     2 g 100 mL/hr over 30 Minutes Intravenous Every 12 hours 10/17/13 0211 10/17/13 1602   10/17/13 0600  vancomycin (VANCOCIN) IVPB 1000 mg/200 mL premix  Status:  Discontinued     1,000 mg 200 mL/hr over 60 Minutes Intravenous Every 24 hours 10/17/13 0234 10/18/13 1310   10/17/13 0211  cefTRIAXone (ROCEPHIN) 2 g in dextrose 5 % 50 mL IVPB  Status:  Discontinued     2 g 100 mL/hr over 30 Minutes Intravenous 30 min pre-op 10/17/13 0211 10/17/13 0224   10/17/13 0211  vancomycin (VANCOCIN) 1,500 mg in sodium chloride 0.9 % 500 mL IVPB  Status:  Discontinued     1,500 mg 250 mL/hr over 120 Minutes Intravenous 120 min pre-op 10/17/13 0211 10/17/13 0224   10/16/13 2104  vancomycin (VANCOCIN) powder  Status:  Discontinued       As needed 10/16/13 2105 10/16/13 2238   10/16/13 2102  vancomycin (VANCOCIN) 1000 MG powder    Comments:  Aydelette, Jamie   : cabinet override      10/16/13 2102 10/17/13 0914   10/16/13 2022  bacitracin 50,000 Units in sodium chloride irrigation 0.9 % 500 mL irrigation  Status:  Discontinued       As needed  10/16/13 2030 10/16/13 2238   10/16/13 2000  cefTRIAXone (ROCEPHIN) 1 g in dextrose 5 % 50 mL IVPB     1 g 100 mL/hr over 30 Minutes Intravenous To Surgery 10/16/13 1957 10/16/13 2043   10/16/13 1954  vancomycin (VANCOCIN) 1 GM/200ML IVPB    Comments:  Foley, Roger   : cabinet override      10/16/13 1954 10/16/13 2058   10/14/13 1000  levofloxacin (LEVAQUIN) tablet 500 mg  Status:  Discontinued     500 mg Oral Every 48 hours 10/14/13 0737 10/18/13 1534   10/13/13 1200  cefTRIAXone (ROCEPHIN) 1 g in dextrose 5 % 50 mL IVPB  Status:  Discontinued     1 g 100 mL/hr over 30 Minutes Intravenous Every 24 hours 10/12/13 1325 10/14/13 0737   10/12/13 1045  cefTRIAXone (ROCEPHIN) 1 g in dextrose 5 % 50 mL IVPB  Status:  Discontinued     1 g 100 mL/hr over 30 Minutes Intravenous  Once 10/12/13 1038 10/12/13 1256     PHYSICAL EXAM: Vital signs in last 24 hours: Filed Vitals:   10/18/13 0516 10/18/13 1450 10/18/13 2140 10/19/13 0500  BP: 101/58 95/60 106/71 116/57  Pulse: 72 76 84 81  Temp: 98.3 F (36.8  C) 98 F (36.7 C) 98.2 F (36.8 C) 97.8 F (36.6 C)  TempSrc: Oral Oral Oral Oral  Resp: 16 18 18 18   Height:      Weight: 100.1 kg (220 lb 10.9 oz)   101.3 kg (223 lb 5.2 oz)  SpO2: 96% 96% 96% 96%    Weight change: 1.2 kg (2 lb 10.3 oz) Filed Weights   10/17/13 0200 10/18/13 0516 10/19/13 0500  Weight: 101.4 kg (223 lb 8.7 oz) 100.1 kg (220 lb 10.9 oz) 101.3 kg (223 lb 5.2 oz)   Body mass index is 36.06 kg/(m^2).   Gen Exam: Awake and alert with clear speech, [place] accent. Neck: Supple, No JVD.   Chest: B/L Clear.   CVS: S1 S2 Regular, no murmurs.  Abdomen: soft, BS +, non tender, non distended.  Extremities: 1+ pitting edema, lower extremities warm to touch. Right leg with decreased strength compared to left. Neurologic: Non Focal.  Skin: No Rash.    Intake/Output from previous day:  Intake/Output Summary (Last 24 hours) at 10/19/13 1326 Last data filed at 10/19/13  0705  Gross per 24 hour  Intake      0 ml  Output   1390 ml  Net  -1390 ml    LAB RESULTS: CBC  Recent Labs Lab 10/13/13 0525 10/14/13 0415 10/15/13 0427 10/17/13 1200 10/18/13 0945 10/19/13 0600  WBC 16.3* 15.0* 14.3* 14.5* 13.2* 10.1  HGB 11.7* 12.0 11.4* 10.3* 9.6* 9.6*  HCT 33.4* 32.7* 32.9* 29.2* 27.4* 27.6*  PLT 254 294 296 338 307 311  MCV 95.7 95.9 96.8 97.7 95.8 98.2  MCH 33.5 35.2* 33.5 34.4* 33.6 34.2*  MCHC 35.0 36.7* 34.7 35.3 35.0 34.8  RDW 14.0 14.3 14.6 15.4 15.4 15.5  LYMPHSABS  --  1.1 2.1  --   --   --   MONOABS  --  1.0 1.3*  --   --   --   EOSABS  --  0.0 0.1  --   --   --   BASOSABS  --  0.0 0.0  --   --   --    Chemistries   Recent Labs Lab 10/15/13 0427 10/16/13 0412 10/17/13 1200 10/18/13 0945 10/19/13 0600  NA 130* 130* 134* 134* 138  K 3.6* 4.3 4.3 3.3* 3.5*  CL 99 101 102 107 107  CO2 18* 15* 14* 17* 16*  GLUCOSE 61* 200* 290* 164* 136*  BUN 48* 51* 45* 42* 43*  CREATININE 1.98* 1.87* 1.43* 1.35* 1.36*  CALCIUM 8.7 8.7 8.3* 8.4 9.0   CBG:  Recent Labs Lab 10/18/13 1151 10/18/13 1658 10/18/13 2143 10/19/13 0809 10/19/13 1213  GLUCAP 126* 189* 199* 117* 119*   GFR Estimated Creatinine Clearance: 41.6 ml/min (by C-G formula based on Cr of 1.36).  Coagulation profile  Recent Labs Lab 10/13/13 0525  INR 1.13    Recent Labs  10/16/13 1615  TSH 1.240   MICROBIOLOGY: Recent Results (from the past 240 hour(s))  URINE CULTURE     Status: None   Collection Time    10/12/13 10:42 AM      Result Value Ref Range Status   Specimen Description URINE, CATHETERIZED   Final   Special Requests Normal   Final   Culture  Setup Time     Final   Value: 10/12/2013 13:40     Performed at [facility]     Final   Value: >=100,000 COLONIES/ML     Performed  at [facility]   Culture     Final   Value: ESCHERICHIA COLI     Performed at [facility]   Report Status 10/14/2013 FINAL   Final    Organism ID, Bacteria ESCHERICHIA COLI   Final  SURGICAL PCR SCREEN     Status: None   Collection Time    10/16/13  6:20 PM      Result Value Ref Range Status   MRSA, PCR NEGATIVE  NEGATIVE Final   Staphylococcus aureus NEGATIVE  NEGATIVE Final   Comment:            The Xpert SA Assay (FDA     approved for NASAL specimens     in patients over 5 years of age),     is one component of     a comprehensive surveillance     program.  Test performance has     been validated by [employer] for patients greater     than or equal to 31 year old.     It is not intended     to diagnose infection nor to     guide or monitor treatment.  CULTURE, ROUTINE-ABSCESS     Status: None   Collection Time    10/16/13  8:42 PM      Result Value Ref Range Status   Specimen Description ABSCESS   Final   Special Requests EPIDURAL ABSCESS SPECIMEN NO 1   Final   Gram Stain     Final   Value: MODERATE WBC PRESENT,BOTH PMN AND MONONUCLEAR     NO SQUAMOUS EPITHELIAL CELLS SEEN     RARE GRAM NEGATIVE RODS     Performed at [facility]   Culture     Final   Value: FEW ESCHERICHIA COLI     Performed at [facility]   Report Status 10/19/2013 FINAL   Final   Organism ID, Bacteria ESCHERICHIA COLI   Final  CULTURE, ROUTINE-ABSCESS     Status: None   Collection Time    10/16/13  8:44 PM      Result Value Ref Range Status   Specimen Description ABSCESS   Final   Special Requests EPIDURAL LUMBAR ABSCESS SPECIMEN NO 2   Final   Gram Stain     Final   Value: RARE WBC PRESENT, PREDOMINANTLY MONONUCLEAR     NO SQUAMOUS EPITHELIAL CELLS SEEN     NO ORGANISMS SEEN     Performed at [facility]   Culture     Final   Value: FEW ESCHERICHIA COLI     Performed at [facility]   Report Status 10/19/2013 FINAL   Final   Organism ID, Bacteria ESCHERICHIA COLI   Final    RADIOLOGY STUDIES/RESULTS: Ct Abdomen Pelvis Wo Contrast 10/12/2013   CLINICAL DATA:  Back pain.  EXAM: CT  ABDOMEN AND PELVIS WITHOUT CONTRAST  TECHNIQUE: Multidetector CT imaging of the abdomen and pelvis was performed following the standard protocol without IV contrast.  COMPARISON:  None.  FINDINGS: Few linear densities at lung bases are suggestive for atelectasis or scarring. There is no evidence for free intraperitoneal air.  The liver contour is diffusely nodular with mild enlargement of the left hepatic lobe. Findings are compatible with cirrhosis. There is no large or gross liver lesion. The gallbladder wall has diffuse calcifications and question focal calcifications versus a stone at the gallbladder base. The spleen is normal for  size.  There is a 2.2 cm low-density lesion in the left adrenal gland and the Hounsfield units are less than 10. Findings are compatible with a left adrenal adenoma. There may be at least 1 additional adenoma in the left adrenal gland. Small adenoma in the right adrenal gland. Normal appearance of the pancreas, both kidneys and the urinary bladder. No evidence for hydronephrosis or renal stones.  There is severe levoscoliosis in the lumbar spine with multilevel degenerative disease. There is vacuum disc phenomenon with gas bubbles throughout the lumbar spine. No gross abnormality to the uterus or adnexa tissue. There is colonic diverticulosis without acute colonic inflammation. Normal appearance of small bowel without an obstructive process. There is a ventral hernia containing fat.  IMPRESSION: Severe degenerative disease in the lumbar spine with levoscoliosis.  The liver is diffusely nodular and compatible with cirrhosis. No evidence for ascites or splenomegaly.  Diffuse gallbladder wall calcifications. Findings may represent early stages of a porcelain gallbladder. Difficult to exclude a gallstone at the gallbladder base. The gallbladder could be further characterized with ultrasound.  Bilateral adrenal adenomas.   Electronically Signed   By: Markus Daft M.D.   On: 10/12/2013 10:31     Mr Lumbar Spine Wo Contrast 10/16/2013   CLINICAL DATA:  Acute on chronic low back pain.  EXAM: MRI LUMBAR SPINE WITHOUT CONTRAST  TECHNIQUE: Multiplanar, multisequence MR imaging of the lumbar spine was performed. No intravenous contrast was administered.  COMPARISON:  CT abdomen and pelvis 10/12/2013  FINDINGS: For the purposes of this dictation, the lowest well-formed intervertebral disc space is presumed to be L5-S1.  Prominent S-shaped thoracolumbar scoliosis is present, convex to the left in the lumbar spine. No significant listhesis is seen. Moderate to severe disc space narrowing is seen throughout the lumbar spine, with relative preservation of the L3-4 disc space. Diffuse degenerative marrow changes are present. No vertebral marrow edema is seen.  There is a dorsal epidural gas and fluid collection extending from L2 to the superior aspect of L4 predominantly in the midline and left of midline. The collection is predominantly fluid, with a small amount of gas superiorly at the L2 level. The amount of gas in the collection has greatly decreased from the recent abdominal CT, however the gas appears to have been replaced by fluid. There is mass effect on the thecal sac, greatest at L2-3 where there is severe spinal stenosis. At the L2 level, the collection measures 13 x 11 mm (transverse by AP). The collection measures 6-6.5 cm in craniocaudal length. A fluid collection posterior to the left L4-5 facet joint measures 11 x 13 x 26 mm. There is also a 10 x 4 mm fluid collection medial to the left L4-5 facet in the epidural space. There is edema in the left-sided posterior paraspinal soft tissues of the mid to lower lumbar spine, greatest about the left L4-5 facet joint. There is also moderate marrow edema about the left L4-5 facet joint.  The tip of the conus medullaris is not clearly localized but is likely near the L1-2 disc space level. There is marked distention of the bladder, incompletely visualized.  There is moderate bilateral hydroureteronephrosis.  T11-12: Disc bulge, osteophytic ridging, and moderate facet hypertrophy result in mild to moderate spinal stenosis and mild left neural foraminal stenosis.  T12-L1: Circumferential disc bulge, osteophytic ridging, and moderate facet hypertrophy result in moderate spinal stenosis, left greater than right lateral recess stenosis, and mild left neural foraminal stenosis.  L1-2: Circumferential disc bulge,  osteophytic ridging, and moderate to severe facet hypertrophy result in mild spinal stenosis and mild right neural foraminal stenosis.  L2-3: Severe spinal stenosis predominantly due to the dorsal epidural fluid collection. There is disc bulging and osteophytic ridging asymmetric to the right along with moderate to severe right greater than left facet hypertrophy, resulting in moderate right neural foraminal stenosis.  L3-4: Severe spinal stenosis due to the dorsal epidural fluid collection, mild disc bulging, and severe facet hypertrophy. Mild right neural foraminal stenosis.  L4-5: Mild spinal stenosis due to osteophytic ridging, left lateral epidural fluid collection/synovial cyst, and moderate facet hypertrophy. No significant neural foraminal stenosis.  L5-S1: Left greater than right lateral recess stenosis due to disc bulging, osteophytic ridging, and moderate to severe facet hypertrophy. Mild right and moderate left neural foraminal stenosis.  IMPRESSION: 1. Epidural gas and fluid collection extending from L2 -L4. This contributes to severe spinal stenosis at L2-3 and L3-4 with complete effacement of the thecal sac. The amount of gas has decreased from the prior CT but the amount of fluid appears to have increased. This is concerning for epidural abscess. 2. Small fluid collections adjacent to the left L4-5 facet joint with surrounding marrow and soft tissue edema. These may reflect acute on chronic facet arthritis and synovial cysts, however septic facet  arthritis with associated abscesses is also of concern, particularly given the epidural collection. 3. Marked distention of the bladder with bilateral hydroureteronephrosis. This could potentially reflect urinary retention due to mass effect on the cauda equina. 4. Thoracolumbar scoliosis with diffuse, severe disc degeneration and facet arthrosis resulting in spinal canal and neural foraminal stenosis as above. These results were called by telephone at the time of interpretation on 10/16/2013 at 5:00 pm to Dr. Louellen Molder , who verbally acknowledged these results.   Electronically Signed   By: Logan Bores   On: 10/16/2013 17:04   US Abdomen Complete 10/12/2013   CLINICAL DATA:  Liver, renal pathology. Abnormal CT. Abdominal pain and flank pain. Elevated alkaline phosphatase and bilirubin.  EXAM: ULTRASOUND ABDOMEN COMPLETE  COMPARISON:  CT of the abdomen and pelvis on 10/12/2013  FINDINGS: Gallbladder:  The gallbladder wall is calcified. Gallbladder wall is 2 mm in thickness. No definite stones are identified. No pericholecystic fluid or sonographic Murphy sign.  Common bile duct:  Diameter: Estimated to be 5 mm. The common bile duct is difficult to visualize in its entire course.  Liver:  The liver appears heterogeneous and nodular, consistent with cirrhosis. No focal liver lesions are identified.  IVC:  The intrahepatic inferior vena cava is not well seen. Visualized portion has a normal appearance.  Pancreas:  Pancreatic tail is not well seen. The visualized portion is normal in appearance.  Spleen:  6.9 cm and normal in appearance.  Right Kidney:  Length: 9.6 cm. Echogenicity within normal limits. No mass or hydronephrosis visualized.  Left Kidney:  Length: 11.7 cm. Echogenicity within normal limits. No mass or hydronephrosis visualized.  Abdominal aorta:  Visualized portion not aneurysmal.  Other findings:  Study is technically difficult because of patient body habitus.  IMPRESSION: 1. Findings consistent  with hepatic cirrhosis. No focal mass identified. 2. Porcelain gallbladder. No definite stones identified. No evidence for acute cholecystitis. 3. No hydronephrosis. 4. Difficult exam because of patient body habitus.   Electronically Signed   By: Shon Hale M.D.   On: 10/12/2013 14:19   Dg Lumbar Spine 1 View 10/16/2013   CLINICAL DATA:  Epidural abscess.  EXAM: LUMBAR  SPINE - 1 VIEW  COMPARISON:  MRI dated 10/16/2013  FINDINGS: There is an instrument at the L3-4 level.  IMPRESSION: Instrument at L3-4.   Electronically Signed   By: Rozetta Nunnery M.D.   On: 10/16/2013 20:38   Dg Chest Port 1 View 10/17/2013   CLINICAL DATA:  Endotracheal tube placement.  EXAM: PORTABLE CHEST - 1 VIEW  COMPARISON:  None.  FINDINGS: The patient's endotracheal tube is seen ending 5 cm above the carina.  The lungs are hypoexpanded. Bibasilar airspace opacities likely reflect atelectasis. Pulmonary vascularity is at the upper limits of normal. No definite pleural effusion or pneumothorax is seen.  The cardiomediastinal silhouette is enlarged. No acute osseous abnormalities are identified.  IMPRESSION: 1. Endotracheal tube seen ending 5 cm above the carina. 2. Lungs hypoexpanded. Bibasilar airspace opacities likely reflect atelectasis. 3. Cardiomegaly.   Electronically Signed   By: Garald Balding M.D.   On: 10/17/2013 02:19    Audelia Acton, PA-S [facility] Pager:[phone]  If 7PM-7AM, please contact night-coverage [email or web address] Password [email or web address] 10/19/2013, 1:26 PM   LOS: 7 days   **Disclaimer: This note may have been dictated with voice recognition software. Similar sounding words can inadvertently be transcribed and this note may contain transcription errors which may not have been corrected upon publication of note.**

## 2013-10-19 NOTE — Progress Notes (Signed)
Subjective: Patient reports stable weakness in right leg, with stronger right leg  Objective: Vital signs in last 24 hours: Temp:  [97.8 F (36.6 C)-98.2 F (36.8 C)] 97.8 F (36.6 C) (08/15 0500) Pulse Rate:  [76-84] 81 (08/15 0500) Resp:  [18] 18 (08/15 0500) BP: (95-116)/(57-71) 116/57 mmHg (08/15 0500) SpO2:  [96 %] 96 % (08/15 0500) Weight:  [101.3 kg (223 lb 5.2 oz)] 101.3 kg (223 lb 5.2 oz) (08/15 0500)  Intake/Output from previous day: 08/14 0701 - 08/15 0700 In: -  Out: 190 [Drains:190] Intake/Output this shift: Total I/O In: -  Out: 1200 [Urine:1200]  Physical Exam: Stable motor exam in lower extremities  Lab Results:  Recent Labs  10/18/13 0945 10/19/13 0600  WBC 13.2* 10.1  HGB 9.6* 9.6*  HCT 27.4* 27.6*  PLT 307 311   BMET  Recent Labs  10/18/13 0945 10/19/13 0600  NA 134* 138  K 3.3* 3.5*  CL 107 107  CO2 17* 16*  GLUCOSE 164* 136*  BUN 42* 43*  CREATININE 1.35* 1.36*  CALCIUM 8.4 9.0    Studies/Results: No results found.  Assessment/Plan: Continue to manage ESA, drain to continue today.  Will need Rehab.    LOS: 7 days    Peggyann Shoals, MD 10/19/2013, 12:58 PM

## 2013-10-20 ENCOUNTER — Inpatient Hospital Stay (HOSPITAL_COMMUNITY): Payer: Medicare HMO

## 2013-10-20 DIAGNOSIS — E872 Acidosis, unspecified: Secondary | ICD-10-CM

## 2013-10-20 LAB — C-REACTIVE PROTEIN: CRP: 6.8 mg/dL — ABNORMAL HIGH (ref <0.60)

## 2013-10-20 LAB — GLUCOSE, CAPILLARY
GLUCOSE-CAPILLARY: 128 mg/dL — AB (ref 70–99)
Glucose-Capillary: 106 mg/dL — ABNORMAL HIGH (ref 70–99)
Glucose-Capillary: 127 mg/dL — ABNORMAL HIGH (ref 70–99)
Glucose-Capillary: 68 mg/dL — ABNORMAL LOW (ref 70–99)
Glucose-Capillary: 84 mg/dL (ref 70–99)

## 2013-10-20 LAB — SEDIMENTATION RATE: Sed Rate: 76 mm/hr — ABNORMAL HIGH (ref 0–22)

## 2013-10-20 NOTE — Progress Notes (Signed)
Thick blood colored discharge noted to patients vaginal area prior to foley being taken out. Urine appeared blood-tinged in foley bag. Pt denies pain or discomfort. MD ghimire aware. Will continue to monitor.

## 2013-10-20 NOTE — Progress Notes (Signed)
PATIENT DETAILS Name: Tamara Stephenson Age: 80 y.o. Sex: female Date of Birth: August 09, 1933 Admit Date: 10/12/2013 Admitting Physician Nita Sells, MD DTO:[id number] A, MD  Subjective: Patient has no new complaints today.  Remain afebrile-claims that she can move her right leg more than her left.  Assessment/Plan: Active Problems: Epidural Abscess-s/p lumbar laminectomy with chronic low back pain - Patient reported progressive back pain with lower extremity weakness limiting her mobility at home for several weeks. She was unable to ambulate. MRI lumbar spine without contrast on 8/12 showed severe L3-L4 spinal stenosis with epidural fluid collection and bilateral Hydronephrosis possibly secondary to cauda equina as per radiologist. [facility] neurosurgery Dr Annette Stable who had pt transferred to [facility] and on 8/12 performed L2-L5 decompressive laminectomy with evacuation of epidural abscess. Wound irrigated, vancomycin powder applied topically and an epidural wound vac placed. Intraoperative cultures are positive for E Coli. . Seen by infectious disease, current recommendations are to continue with Levaquin for 6 weeks-currently on day7. Remains afebrile and without any fever. Will likely require SNF on discharge. Fortunately has improved in a motor strength of her lower extremities, right lower extremity 3/5, left lower extremity 4/5.  E Coli UTI (urinary tract infection) -Urine culture positive for E Coli -Remains afebrile and without any fever.Leukocytosis decreasing as well. -Continue empiric levaquin -Denies dysuria or flank pain  Acute Renal Failure -Possibly in the setting of UTI, versus chronic kidney disease secondary to diabetic nephropathy. -Patient was taking regular NSAIDs at home. Off NSAIDs. Improving with hydration-continue to check lytes periodically -Abdominal ultrasound 8/8 demonstrated no hydronephrosis, no nephrolithiasis.   Metabolic acidosis  -Possibly due to ?  CKD, RTA -Improving; continue to replinish with sodium bicarbonate tablets TID.   Hyponatremia -resolved. Secondary to dehydration.  Chronic Diastolic CHF  -BNP elevated at 1099,-2-D echocardiogram shows preserved EF-but does show diastolic dysfunction. Mostly compensated-but does have mild lower ext edema. Will start gently diuresis over the next few days  Diabetes mellitus -CBG's stable- Levemir dose currently 12 units at bedtime and premeal aspart 3 units TID -A1C of 8.7  Hypertension -Controlled, most recently borderline hypotensive at 101/58. Currently not on any antihypertensives. Given lower leg edema, will avoid resuming Amlodipine on discharge.   Anemia -secondary to acute illness -Hgb stable at 9.6-monitor periodically  Fatty liver disease, nonalcoholic -Continue ursodiol, monitor.  -Abdominal US 8/8 demonstrated hepatic cirrhosis, no focal mass.    Bilateral adrenal adenomas -Noted on CT abdomen on 8/8 -Refer to urology; Need to have this reviewed by urologist as an outpatient   Porcelain gallbladder -Abdominal ultrasound 8/8 demonstrated porcelain gallbladder, no definite stones identified and no evidence  for acute cholecystitis.Belly soft on exam. -Suggest outpatient GI follow-up for surgical assessment  B/L Hydronephrosis -remove foley today, and recheck renal ultrasound in am.  Disposition: Remain inpatient  DVT Prophylaxis: Start SQ Heparin  Code Status: Full code  Family Communication None  Procedures:  None  CONSULTS:  Neurosurgery, ID   MEDICATIONS: Scheduled Meds: . antiseptic oral rinse  1 application Mouth Rinse QID  . buPROPion  300 mg Oral Daily  . chlorhexidine  15 mL Mouth/Throat BID  . famotidine  40 mg Oral Daily  . feeding supplement (RESOURCE BREEZE)  1 Container Oral TID BM  . gabapentin  300 mg Oral QHS  . heparin subcutaneous  5,000 Units Subcutaneous 3 times per day  . insulin aspart  0-15 Units Subcutaneous TID WC    . insulin aspart  3  Units Subcutaneous TID WC  . insulin detemir  12 Units Subcutaneous QHS  . lactulose  20 g Oral BID  . levofloxacin  750 mg Oral Q48H  . polyethylene glycol  17 g Oral Daily  . sodium bicarbonate  650 mg Oral BID  . ursodiol  300 mg Oral TID   Continuous Infusions:  PRN Meds:.HYDROmorphone (DILAUDID) injection, morphine injection, oxyCODONE  Antibiotics: Anti-infectives   Start     Dose/Rate Route Frequency Ordered Stop   10/20/13 1000  levofloxacin (LEVAQUIN) tablet 750 mg     750 mg Oral Every 48 hours 10/18/13 1534     10/18/13 1400  vancomycin (VANCOCIN) IVPB 750 mg/150 ml premix  Status:  Discontinued     750 mg 150 mL/hr over 60 Minutes Intravenous Every 12 hours 10/18/13 1310 10/18/13 1428   10/17/13 0600  cefTRIAXone (ROCEPHIN) 2 g in dextrose 5 % 50 mL IVPB  Status:  Discontinued     2 g 100 mL/hr over 30 Minutes Intravenous Every 12 hours 10/17/13 0211 10/17/13 1602   10/17/13 0600  vancomycin (VANCOCIN) IVPB 1000 mg/200 mL premix  Status:  Discontinued     1,000 mg 200 mL/hr over 60 Minutes Intravenous Every 24 hours 10/17/13 0234 10/18/13 1310   10/17/13 0211  cefTRIAXone (ROCEPHIN) 2 g in dextrose 5 % 50 mL IVPB  Status:  Discontinued     2 g 100 mL/hr over 30 Minutes Intravenous 30 min pre-op 10/17/13 0211 10/17/13 0224   10/17/13 0211  vancomycin (VANCOCIN) 1,500 mg in sodium chloride 0.9 % 500 mL IVPB  Status:  Discontinued     1,500 mg 250 mL/hr over 120 Minutes Intravenous 120 min pre-op 10/17/13 0211 10/17/13 0224   10/16/13 2104  vancomycin (VANCOCIN) powder  Status:  Discontinued       As needed 10/16/13 2105 10/16/13 2238   10/16/13 2102  vancomycin (VANCOCIN) 1000 MG powder    Comments:  Aydelette, Jamie   : cabinet override      10/16/13 2102 10/17/13 0914   10/16/13 2022  bacitracin 50,000 Units in sodium chloride irrigation 0.9 % 500 mL irrigation  Status:  Discontinued       As needed 10/16/13 2030 10/16/13 2238   10/16/13 2000   cefTRIAXone (ROCEPHIN) 1 g in dextrose 5 % 50 mL IVPB     1 g 100 mL/hr over 30 Minutes Intravenous To Surgery 10/16/13 1957 10/16/13 2043   10/16/13 1954  vancomycin (VANCOCIN) 1 GM/200ML IVPB    Comments:  Foley, Roger   : cabinet override      10/16/13 1954 10/16/13 2058   10/14/13 1000  levofloxacin (LEVAQUIN) tablet 500 mg  Status:  Discontinued     500 mg Oral Every 48 hours 10/14/13 0737 10/18/13 1534   10/13/13 1200  cefTRIAXone (ROCEPHIN) 1 g in dextrose 5 % 50 mL IVPB  Status:  Discontinued     1 g 100 mL/hr over 30 Minutes Intravenous Every 24 hours 10/12/13 1325 10/14/13 0737   10/12/13 1045  cefTRIAXone (ROCEPHIN) 1 g in dextrose 5 % 50 mL IVPB  Status:  Discontinued     1 g 100 mL/hr over 30 Minutes Intravenous  Once 10/12/13 1038 10/12/13 1256     PHYSICAL EXAM: Vital signs in last 24 hours: Filed Vitals:   10/19/13 1403 10/19/13 2138 10/20/13 0440 10/20/13 1458  BP: 98/65 115/72 114/76 127/70  Pulse: 93 82 82 78  Temp: 98.1 F (36.7 C) 98.1 F (36.7 C) 98.2  F (36.8 C) 98 F (36.7 C)  TempSrc: Oral Oral Oral Oral  Resp: 18 18 18 18   Height:      Weight:   101.243 kg (223 lb 3.2 oz)   SpO2: 97% 94% 96% 99%    Weight change: -0.057 kg (-2 oz) Filed Weights   10/18/13 0516 10/19/13 0500 10/20/13 0440  Weight: 100.1 kg (220 lb 10.9 oz) 101.3 kg (223 lb 5.2 oz) 101.243 kg (223 lb 3.2 oz)   Body mass index is 36.04 kg/(m^2).   Gen Exam: Awake and alert with clear speech, [place] accent. Neck: Supple, No JVD.   Chest: B/L Clear.   CVS: S1 S2 Regular, no murmurs.  Abdomen: soft, BS +, non tender, non distended.  Extremities: 1+ pitting edema, lower extremities warm to touch. Right leg with decreased strength compared to left. Neurologic: Non Focal.  Skin: No Rash.    Intake/Output from previous day:  Intake/Output Summary (Last 24 hours) at 10/20/13 1539 Last data filed at 10/20/13 0825  Gross per 24 hour  Intake    220 ml  Output    480 ml  Net   -260  ml    LAB RESULTS: CBC  Recent Labs Lab 10/14/13 0415 10/15/13 0427 10/17/13 1200 10/18/13 0945 10/19/13 0600  WBC 15.0* 14.3* 14.5* 13.2* 10.1  HGB 12.0 11.4* 10.3* 9.6* 9.6*  HCT 32.7* 32.9* 29.2* 27.4* 27.6*  PLT 294 296 338 307 311  MCV 95.9 96.8 97.7 95.8 98.2  MCH 35.2* 33.5 34.4* 33.6 34.2*  MCHC 36.7* 34.7 35.3 35.0 34.8  RDW 14.3 14.6 15.4 15.4 15.5  LYMPHSABS 1.1 2.1  --   --   --   MONOABS 1.0 1.3*  --   --   --   EOSABS 0.0 0.1  --   --   --   BASOSABS 0.0 0.0  --   --   --    Chemistries   Recent Labs Lab 10/15/13 0427 10/16/13 0412 10/17/13 1200 10/18/13 0945 10/19/13 0600  NA 130* 130* 134* 134* 138  K 3.6* 4.3 4.3 3.3* 3.5*  CL 99 101 102 107 107  CO2 18* 15* 14* 17* 16*  GLUCOSE 61* 200* 290* 164* 136*  BUN 48* 51* 45* 42* 43*  CREATININE 1.98* 1.87* 1.43* 1.35* 1.36*  CALCIUM 8.7 8.7 8.3* 8.4 9.0   CBG:  Recent Labs Lab 10/19/13 1213 10/19/13 1637 10/19/13 2141 10/20/13 0743 10/20/13 1219  GLUCAP 119* 231* 140* 106* 127*   GFR Estimated Creatinine Clearance: 41.6 ml/min (by C-G formula based on Cr of 1.36).  Coagulation profile No results found for this basename: INR, PROTIME,  in the last 168 hours No results found for this basename: TSH, T4TOTAL, FREET3, T3FREE, THYROIDAB,  in the last 72 hours MICROBIOLOGY: Recent Results (from the past 240 hour(s))  URINE CULTURE     Status: None   Collection Time    10/12/13 10:42 AM      Result Value Ref Range Status   Specimen Description URINE, CATHETERIZED   Final   Special Requests Normal   Final   Culture  Setup Time     Final   Value: 10/12/2013 13:40     Performed at [facility]     Final   Value: >=100,000 COLONIES/ML     Performed at [facility]   Culture     Final   Value: ESCHERICHIA COLI     Performed at [facility]  Report Status 10/14/2013 FINAL   Final   Organism ID, Bacteria ESCHERICHIA COLI   Final  SURGICAL PCR SCREEN      Status: None   Collection Time    10/16/13  6:20 PM      Result Value Ref Range Status   MRSA, PCR NEGATIVE  NEGATIVE Final   Staphylococcus aureus NEGATIVE  NEGATIVE Final   Comment:            The Xpert SA Assay (FDA     approved for NASAL specimens     in patients over 41 years of age),     is one component of     a comprehensive surveillance     program.  Test performance has     been validated by [employer] for patients greater     than or equal to 50 year old.     It is not intended     to diagnose infection nor to     guide or monitor treatment.  CULTURE, ROUTINE-ABSCESS     Status: None   Collection Time    10/16/13  8:42 PM      Result Value Ref Range Status   Specimen Description ABSCESS   Final   Special Requests EPIDURAL ABSCESS SPECIMEN NO 1   Final   Gram Stain     Final   Value: MODERATE WBC PRESENT,BOTH PMN AND MONONUCLEAR     NO SQUAMOUS EPITHELIAL CELLS SEEN     RARE GRAM NEGATIVE RODS     Performed at [facility]   Culture     Final   Value: FEW ESCHERICHIA COLI     Performed at [facility]   Report Status 10/19/2013 FINAL   Final   Organism ID, Bacteria ESCHERICHIA COLI   Final  AFB CULTURE WITH SMEAR     Status: None   Collection Time    10/16/13  8:42 PM      Result Value Ref Range Status   Specimen Description ABSCESS   Final   Special Requests EPIDURAL ABSCESS SPECIMEN NO 1   Final   Acid Fast Smear     Final   Value: NO ACID FAST BACILLI SEEN     Performed at [facility]   Culture     Final   Value: CULTURE WILL BE EXAMINED FOR 6 WEEKS BEFORE ISSUING A FINAL REPORT     Performed at [facility]   Report Status PENDING   Incomplete  FUNGUS CULTURE W SMEAR     Status: None   Collection Time    10/16/13  8:42 PM      Result Value Ref Range Status   Specimen Description ABSCESS   Final   Special Requests EPIDURAL ABSCESS NO 1   Final   Fungal Smear     Final   Value: NO YEAST OR FUNGAL ELEMENTS SEEN       Performed at [facility]   Culture     Final   Value: CULTURE IN PROGRESS FOR FOUR WEEKS     Performed at [facility]   Report Status PENDING   Incomplete  CULTURE, ROUTINE-ABSCESS     Status: None   Collection Time    10/16/13  8:44 PM      Result Value Ref Range Status   Specimen Description ABSCESS   Final   Special Requests EPIDURAL LUMBAR ABSCESS SPECIMEN NO 2   Final  Gram Stain     Final   Value: RARE WBC PRESENT, PREDOMINANTLY MONONUCLEAR     NO SQUAMOUS EPITHELIAL CELLS SEEN     NO ORGANISMS SEEN     Performed at [facility]   Culture     Final   Value: FEW ESCHERICHIA COLI     Performed at [facility]   Report Status 10/19/2013 FINAL   Final   Organism ID, Bacteria ESCHERICHIA COLI   Final    RADIOLOGY STUDIES/RESULTS: Ct Abdomen Pelvis Wo Contrast 10/12/2013   CLINICAL DATA:  Back pain.  EXAM: CT ABDOMEN AND PELVIS WITHOUT CONTRAST  TECHNIQUE: Multidetector CT imaging of the abdomen and pelvis was performed following the standard protocol without IV contrast.  COMPARISON:  None.  FINDINGS: Few linear densities at lung bases are suggestive for atelectasis or scarring. There is no evidence for free intraperitoneal air.  The liver contour is diffusely nodular with mild enlargement of the left hepatic lobe. Findings are compatible with cirrhosis. There is no large or gross liver lesion. The gallbladder wall has diffuse calcifications and question focal calcifications versus a stone at the gallbladder base. The spleen is normal for size.  There is a 2.2 cm low-density lesion in the left adrenal gland and the Hounsfield units are less than 10. Findings are compatible with a left adrenal adenoma. There may be at least 1 additional adenoma in the left adrenal gland. Small adenoma in the right adrenal gland. Normal appearance of the pancreas, both kidneys and the urinary bladder. No evidence for hydronephrosis or renal stones.  There is severe  levoscoliosis in the lumbar spine with multilevel degenerative disease. There is vacuum disc phenomenon with gas bubbles throughout the lumbar spine. No gross abnormality to the uterus or adnexa tissue. There is colonic diverticulosis without acute colonic inflammation. Normal appearance of small bowel without an obstructive process. There is a ventral hernia containing fat.  IMPRESSION: Severe degenerative disease in the lumbar spine with levoscoliosis.  The liver is diffusely nodular and compatible with cirrhosis. No evidence for ascites or splenomegaly.  Diffuse gallbladder wall calcifications. Findings may represent early stages of a porcelain gallbladder. Difficult to exclude a gallstone at the gallbladder base. The gallbladder could be further characterized with ultrasound.  Bilateral adrenal adenomas.   Electronically Signed   By: Markus Daft M.D.   On: 10/12/2013 10:31   Mr Lumbar Spine Wo Contrast 10/16/2013   CLINICAL DATA:  Acute on chronic low back pain.  EXAM: MRI LUMBAR SPINE WITHOUT CONTRAST  TECHNIQUE: Multiplanar, multisequence MR imaging of the lumbar spine was performed. No intravenous contrast was administered.  COMPARISON:  CT abdomen and pelvis 10/12/2013  FINDINGS: For the purposes of this dictation, the lowest well-formed intervertebral disc space is presumed to be L5-S1.  Prominent S-shaped thoracolumbar scoliosis is present, convex to the left in the lumbar spine. No significant listhesis is seen. Moderate to severe disc space narrowing is seen throughout the lumbar spine, with relative preservation of the L3-4 disc space. Diffuse degenerative marrow changes are present. No vertebral marrow edema is seen.  There is a dorsal epidural gas and fluid collection extending from L2 to the superior aspect of L4 predominantly in the midline and left of midline. The collection is predominantly fluid, with a small amount of gas superiorly at the L2 level. The amount of gas in the collection has  greatly decreased from the recent abdominal CT, however the gas appears to have been replaced by fluid. There  is mass effect on the thecal sac, greatest at L2-3 where there is severe spinal stenosis. At the L2 level, the collection measures 13 x 11 mm (transverse by AP). The collection measures 6-6.5 cm in craniocaudal length. A fluid collection posterior to the left L4-5 facet joint measures 11 x 13 x 26 mm. There is also a 10 x 4 mm fluid collection medial to the left L4-5 facet in the epidural space. There is edema in the left-sided posterior paraspinal soft tissues of the mid to lower lumbar spine, greatest about the left L4-5 facet joint. There is also moderate marrow edema about the left L4-5 facet joint.  The tip of the conus medullaris is not clearly localized but is likely near the L1-2 disc space level. There is marked distention of the bladder, incompletely visualized. There is moderate bilateral hydroureteronephrosis.  T11-12: Disc bulge, osteophytic ridging, and moderate facet hypertrophy result in mild to moderate spinal stenosis and mild left neural foraminal stenosis.  T12-L1: Circumferential disc bulge, osteophytic ridging, and moderate facet hypertrophy result in moderate spinal stenosis, left greater than right lateral recess stenosis, and mild left neural foraminal stenosis.  L1-2: Circumferential disc bulge, osteophytic ridging, and moderate to severe facet hypertrophy result in mild spinal stenosis and mild right neural foraminal stenosis.  L2-3: Severe spinal stenosis predominantly due to the dorsal epidural fluid collection. There is disc bulging and osteophytic ridging asymmetric to the right along with moderate to severe right greater than left facet hypertrophy, resulting in moderate right neural foraminal stenosis.  L3-4: Severe spinal stenosis due to the dorsal epidural fluid collection, mild disc bulging, and severe facet hypertrophy. Mild right neural foraminal stenosis.  L4-5: Mild  spinal stenosis due to osteophytic ridging, left lateral epidural fluid collection/synovial cyst, and moderate facet hypertrophy. No significant neural foraminal stenosis.  L5-S1: Left greater than right lateral recess stenosis due to disc bulging, osteophytic ridging, and moderate to severe facet hypertrophy. Mild right and moderate left neural foraminal stenosis.  IMPRESSION: 1. Epidural gas and fluid collection extending from L2 -L4. This contributes to severe spinal stenosis at L2-3 and L3-4 with complete effacement of the thecal sac. The amount of gas has decreased from the prior CT but the amount of fluid appears to have increased. This is concerning for epidural abscess. 2. Small fluid collections adjacent to the left L4-5 facet joint with surrounding marrow and soft tissue edema. These may reflect acute on chronic facet arthritis and synovial cysts, however septic facet arthritis with associated abscesses is also of concern, particularly given the epidural collection. 3. Marked distention of the bladder with bilateral hydroureteronephrosis. This could potentially reflect urinary retention due to mass effect on the cauda equina. 4. Thoracolumbar scoliosis with diffuse, severe disc degeneration and facet arthrosis resulting in spinal canal and neural foraminal stenosis as above. These results were called by telephone at the time of interpretation on 10/16/2013 at 5:00 pm to Dr. Louellen Molder , who verbally acknowledged these results.   Electronically Signed   By: Logan Bores   On: 10/16/2013 17:04   US Abdomen Complete 10/12/2013   CLINICAL DATA:  Liver, renal pathology. Abnormal CT. Abdominal pain and flank pain. Elevated alkaline phosphatase and bilirubin.  EXAM: ULTRASOUND ABDOMEN COMPLETE  COMPARISON:  CT of the abdomen and pelvis on 10/12/2013  FINDINGS: Gallbladder:  The gallbladder wall is calcified. Gallbladder wall is 2 mm in thickness. No definite stones are identified. No pericholecystic fluid or  sonographic Murphy sign.  Common bile duct:  Diameter: Estimated to be 5 mm. The common bile duct is difficult to visualize in its entire course.  Liver:  The liver appears heterogeneous and nodular, consistent with cirrhosis. No focal liver lesions are identified.  IVC:  The intrahepatic inferior vena cava is not well seen. Visualized portion has a normal appearance.  Pancreas:  Pancreatic tail is not well seen. The visualized portion is normal in appearance.  Spleen:  6.9 cm and normal in appearance.  Right Kidney:  Length: 9.6 cm. Echogenicity within normal limits. No mass or hydronephrosis visualized.  Left Kidney:  Length: 11.7 cm. Echogenicity within normal limits. No mass or hydronephrosis visualized.  Abdominal aorta:  Visualized portion not aneurysmal.  Other findings:  Study is technically difficult because of patient body habitus.  IMPRESSION: 1. Findings consistent with hepatic cirrhosis. No focal mass identified. 2. Porcelain gallbladder. No definite stones identified. No evidence for acute cholecystitis. 3. No hydronephrosis. 4. Difficult exam because of patient body habitus.   Electronically Signed   By: Shon Hale M.D.   On: 10/12/2013 14:19   Dg Lumbar Spine 1 View 10/16/2013   CLINICAL DATA:  Epidural abscess.  EXAM: LUMBAR SPINE - 1 VIEW  COMPARISON:  MRI dated 10/16/2013  FINDINGS: There is an instrument at the L3-4 level.  IMPRESSION: Instrument at L3-4.   Electronically Signed   By: Rozetta Nunnery M.D.   On: 10/16/2013 20:38   Dg Chest Port 1 View 10/17/2013   CLINICAL DATA:  Endotracheal tube placement.  EXAM: PORTABLE CHEST - 1 VIEW  COMPARISON:  None.  FINDINGS: The patient's endotracheal tube is seen ending 5 cm above the carina.  The lungs are hypoexpanded. Bibasilar airspace opacities likely reflect atelectasis. Pulmonary vascularity is at the upper limits of normal. No definite pleural effusion or pneumothorax is seen.  The cardiomediastinal silhouette is enlarged. No acute osseous  abnormalities are identified.  IMPRESSION: 1. Endotracheal tube seen ending 5 cm above the carina. 2. Lungs hypoexpanded. Bibasilar airspace opacities likely reflect atelectasis. 3. Cardiomegaly.   Electronically Signed   By: Garald Balding M.D.   On: 10/17/2013 02:19    Audelia Acton, PA-S [facility] Pager:[phone]  If 7PM-7AM, please contact night-coverage [email or web address] Password [email or web address] 10/20/2013, 3:39 PM   LOS: 8 days   **Disclaimer: This note may have been dictated with voice recognition software. Similar sounding words can inadvertently be transcribed and this note may contain transcription errors which may not have been corrected upon publication of note.**

## 2013-10-21 DIAGNOSIS — G061 Intraspinal abscess and granuloma: Secondary | ICD-10-CM | POA: Diagnosis present

## 2013-10-21 DIAGNOSIS — N390 Urinary tract infection, site not specified: Secondary | ICD-10-CM | POA: Diagnosis present

## 2013-10-21 DIAGNOSIS — K828 Other specified diseases of gallbladder: Secondary | ICD-10-CM

## 2013-10-21 LAB — BASIC METABOLIC PANEL
ANION GAP: 10 (ref 5–15)
BUN: 28 mg/dL — AB (ref 6–23)
CO2: 20 meq/L (ref 19–32)
CREATININE: 1.1 mg/dL (ref 0.50–1.10)
Calcium: 8.8 mg/dL (ref 8.4–10.5)
Chloride: 110 mEq/L (ref 96–112)
GFR calc Af Amer: 55 mL/min — ABNORMAL LOW (ref >90)
GFR calc non Af Amer: 47 mL/min — ABNORMAL LOW (ref >90)
Glucose, Bld: 68 mg/dL — ABNORMAL LOW (ref 70–99)
Potassium: 4.4 mEq/L (ref 3.7–5.3)
Sodium: 140 mEq/L (ref 137–147)

## 2013-10-21 LAB — CBC
HCT: 28.1 % — ABNORMAL LOW (ref 36.0–46.0)
Hemoglobin: 9.8 g/dL — ABNORMAL LOW (ref 12.0–15.0)
MCH: 34 pg (ref 26.0–34.0)
MCHC: 34.9 g/dL (ref 30.0–36.0)
MCV: 97.6 fL (ref 78.0–100.0)
Platelets: 300 10*3/uL (ref 150–400)
RBC: 2.88 MIL/uL — AB (ref 3.87–5.11)
RDW: 16.5 % — ABNORMAL HIGH (ref 11.5–15.5)
WBC: 10.7 10*3/uL — AB (ref 4.0–10.5)

## 2013-10-21 LAB — GLUCOSE, CAPILLARY
GLUCOSE-CAPILLARY: 87 mg/dL (ref 70–99)
Glucose-Capillary: 118 mg/dL — ABNORMAL HIGH (ref 70–99)
Glucose-Capillary: 121 mg/dL — ABNORMAL HIGH (ref 70–99)

## 2013-10-21 MED ORDER — LEVOFLOXACIN 500 MG PO TABS: 500.0000 mg | ORAL_TABLET | Freq: Every day | ORAL | Status: AC

## 2013-10-21 MED ORDER — POLYETHYLENE GLYCOL 3350 17 G PO PACK: 17.0000 g | PACK | Freq: Every day | ORAL | Status: DC

## 2013-10-21 MED ORDER — INSULIN ASPART 100 UNIT/ML ~~LOC~~ SOLN: 0.0000 [IU] | Freq: Three times a day (TID) | SUBCUTANEOUS | Status: DC

## 2013-10-21 MED ORDER — GABAPENTIN 300 MG PO CAPS: 300.0000 mg | ORAL_CAPSULE | Freq: Every day | ORAL | Status: AC

## 2013-10-21 MED ORDER — LACTULOSE 10 GM/15ML PO SOLN: 20.0000 g | Freq: Two times a day (BID) | ORAL | Status: DC

## 2013-10-21 MED ORDER — INSULIN DETEMIR 100 UNIT/ML ~~LOC~~ SOLN
12.0000 [IU] | Freq: Every day | SUBCUTANEOUS | Status: DC
Start: 2013-10-21 — End: 2014-12-30

## 2013-10-21 MED ORDER — HYDROCODONE-IBUPROFEN 7.5-200 MG PO TABS: 1.0000 | ORAL_TABLET | Freq: Three times a day (TID) | ORAL | Status: DC | PRN

## 2013-10-21 MED ORDER — INSULIN ASPART 100 UNIT/ML ~~LOC~~ SOLN: 3.0000 [IU] | Freq: Three times a day (TID) | SUBCUTANEOUS | Status: DC

## 2013-10-21 MED ORDER — OXYCODONE HCL 5 MG PO TABS: 5.0000 mg | ORAL_TABLET | ORAL | Status: DC | PRN

## 2013-10-21 MED ORDER — LEVOFLOXACIN 500 MG PO TABS
500.0000 mg | ORAL_TABLET | Freq: Every day | ORAL | Status: DC
  Administered 2013-10-21: 500 mg via ORAL
  Filled 2013-10-21: qty 1

## 2013-10-21 NOTE — Discharge Instructions (Signed)
Please obtain gastroenterology follow up for "porcelain gallbladder".   Monday, August 24 appointment at 8:00 am at [facility] for voiding trial.

## 2013-10-21 NOTE — Clinical Social Work Placement (Signed)
Clinical Social Work Department CLINICAL SOCIAL WORK PLACEMENT NOTE 10/21/2013  Patient:  Tamara Stephenson, Tamara Stephenson  Account Number:  [id number] Admit date:  10/12/2013  Clinical Social Worker:  Ulyess Blossom  Date/time:  10/16/2013 04:50 PM  Clinical Social Work is seeking post-discharge placement for this patient at the following level of care:   SKILLED NURSING   (*CSW will update this form in Epic as items are completed)   10/16/2013  Patient/family provided with [place] Department of Clinical Social Work's list of facilities offering this level of care within the geographic area requested by the patient (or if unable, by the patient's family).  10/16/2013  Patient/family informed of their freedom to choose among providers that offer the needed level of care, that participate in Medicare, Medicaid or managed care program needed by the patient, have an available bed and are willing to accept the patient.  10/16/2013  Patient/family informed of [facility]' ownership interest in [facility], as well as of the fact that they are under no obligation to receive care at this facility.  PASARR submitted to E[employer] on 10/16/2013 PASARR number received on 10/16/2013  FL2 transmitted to all facilities in geographic area requested by pt/family on  10/16/2013 FL2 transmitted to all facilities within larger geographic area on   Patient informed that his/her managed care company has contracts with or will negotiate with  certain facilities, including the following:     Patient/family informed of bed offers received:  10/21/2013 Patient chooses bed at [place], [facility] Physician recommends and patient chooses bed at    Patient to be transferred to [place], [facility] on  10/21/2013 Patient to be transferred to facility by Ambulance Patient and family notified of transfer on 10/21/2013 Name of family member notified:  Patient state she will contact family in  [place].  The following physician request were entered in Epic:   Additional Comments: Per MD patient ready for DC to [facility]. RN, patient, patient's family (to be notified by patient), and facility notified of DC. RN given number for report. DC packet on chart. [facility] transport requested for patient. CSW signing off.    Liz Beach MSW, [place], [place], [phone]

## 2013-10-21 NOTE — Progress Notes (Signed)
PATIENT DETAILS Name: Tamara Stephenson Age: 80 y.o. Sex: female Date of Birth: 02/04/1934 Admit Date: 10/12/2013 Admitting Physician Nita Sells, MD VEH:[id number],ZMO A, MD  Subjective: Patient has no new complaints and would like to go home today.  Assessment/Plan:  Epidural Abscess-s/p lumbar laminectomy with chronic low back pain  - Patient reported progressive back pain with lower extremity weakness limiting her mobility at home for several weeks. She was unable to ambulate. MRI lumbar spine without contrast on 8/12 showed severe L3-L4 spinal stenosis with epidural fluid collection and bilateral Hydronephrosis possibly secondary to cauda equina as per radiologist. Consulted neurosurgery,  Dr Annette Stable,  who had pt transferred to [facility] and on 8/12 performed L2-L5 decompressive laminectomy with evacuation of epidural abscess. Wound irrigated, vancomycin powder applied topically and an epidural wound vac placed. Intraoperative cultures are positive for E Coli. Seen by ID, current recommendations are to continue with Levaquin for 6 weeks- (10/21/2013) day 8. Remains afebrile. Will require SNF on discharge. Fortunately has improved motor strength of her lower extremities, right lower extremity 3/5, left lower extremity 4/5.  -Neurosurgery to remove drain prior to discharge  E Coli UTI (urinary tract infection)  -Urine culture positive for E Coli  (pan sensitive) -Remains afebrile, very mild leukocytosis 10.7 -Continue empiric levaquin -Denies dysuria or flank pain   Acute Renal Failure  -Resolved. -In the setting of UTI.  Question chronic kidney disease secondary to diabetic nephropathy.  -Patient was taking regular NSAIDs at home. Off NSAIDs. Improving with hydration-continue to check lytes periodically  -Abdominal ultrasound 8/8 demonstrated no hydronephrosis, no nephrolithiasis.  -Serum Creatinine trending down to 2.94 7/65  Metabolic acidosis  -Resolved, Possibly due to RTA    -Replenished with sodium bicarbonate tablets TID  Hyponatremia  -Resolved. Secondary to dehydration.   Chronic Diastolic CHF  -BNP elevated at 1099 on 8/8 -2D echocardiogram shows preserved EF-but does show diastolic dysfunction. Mostly compensated-but does have mild lower ext edema.  -Gently diuresed   Diabetes mellitus  -CBG's stable- Levemir dose currently 12 units at bedtime and premeal aspart 3 units TID  -A1C of 8.7   Hypertension  -Controlled, most recently borderline hypotensive at 107/64. Currently not on any antihypertensives.  -Given lower leg edema, will avoid resuming Amlodipine on discharge.   Anemia  -Likely secondary to acute illness  -Hgb stable at 9.8 -Monitor periodically outpatient  Fatty liver disease, nonalcoholic  -Continue ursodiol, monitor.  -Abdominal US 8/8 demonstrated hepatic cirrhosis, no focal mass.   Bilateral adrenal adenomas  -Noted on CT abdomen on 8/8  -Refer to urology; Need to have this reviewed by urologist as an outpatient   Porcelain gallbladder  -Abdominal ultrasound 8/8 demonstrated porcelain gallbladder, no definite stones identified and no evidence  for acute cholecystitis.Belly soft on exam.  -Suggest outpatient GI follow-up for surgical assessment  -Ms. Dexter prefers to schedule this herself after discharged.  B/L Hydronephrosis  -Replaced foley today given 800cc retained urine -Renal ultrasound 8/16 demonstrated mild renal cortical thinning but no renal mass or hydronephrosis. -Urology appointment scheduled outpatient.  Acute Urinary Retention -had a foley catheter placed earlier this admission, Renal Ultrasound showed bilateral hydronephroses. Voiding trial attempted, foley removed, however had >800 cc of residuals. Hence Foley catheter re-inserted on 8/17. Will need outpatient Urology appt and voiding trial.  Disposition: Remain inpatient, possible discharge this afternoon if ok per Neurosurgery.  DVT Prophylaxis: SQ  Heparin  Code Status: Full code   Family Communication None present  Procedures:  None  CONSULTS:  Neurosurgery, ID  Time spent 40 minutes-which includes 50% of the time with face-to-face with patient/ family and coordinating care related to the above assessment and plan.  MEDICATIONS: Scheduled Meds: . antiseptic oral rinse  1 application Mouth Rinse QID  . buPROPion  300 mg Oral Daily  . chlorhexidine  15 mL Mouth/Throat BID  . famotidine  40 mg Oral Daily  . feeding supplement (RESOURCE BREEZE)  1 Container Oral TID BM  . gabapentin  300 mg Oral QHS  . heparin subcutaneous  5,000 Units Subcutaneous 3 times per day  . insulin aspart  0-15 Units Subcutaneous TID WC  . insulin aspart  3 Units Subcutaneous TID WC  . insulin detemir  12 Units Subcutaneous QHS  . lactulose  20 g Oral BID  . levofloxacin  500 mg Oral Daily  . polyethylene glycol  17 g Oral Daily  . sodium bicarbonate  650 mg Oral BID  . ursodiol  300 mg Oral TID   Continuous Infusions:  PRN Meds:.HYDROmorphone (DILAUDID) injection, morphine injection, oxyCODONE  Antibiotics: Anti-infectives   Start     Dose/Rate Route Frequency Ordered Stop   10/21/13 1000  levofloxacin (LEVAQUIN) tablet 500 mg     500 mg Oral Daily 10/21/13 0825     10/20/13 1000  levofloxacin (LEVAQUIN) tablet 750 mg  Status:  Discontinued     750 mg Oral Every 48 hours 10/18/13 1534 10/21/13 0825   10/18/13 1400  vancomycin (VANCOCIN) IVPB 750 mg/150 ml premix  Status:  Discontinued     750 mg 150 mL/hr over 60 Minutes Intravenous Every 12 hours 10/18/13 1310 10/18/13 1428   10/17/13 0600  cefTRIAXone (ROCEPHIN) 2 g in dextrose 5 % 50 mL IVPB  Status:  Discontinued     2 g 100 mL/hr over 30 Minutes Intravenous Every 12 hours 10/17/13 0211 10/17/13 1602   10/17/13 0600  vancomycin (VANCOCIN) IVPB 1000 mg/200 mL premix  Status:  Discontinued     1,000 mg 200 mL/hr over 60 Minutes Intravenous Every 24 hours 10/17/13 0234 10/18/13  1310   10/17/13 0211  cefTRIAXone (ROCEPHIN) 2 g in dextrose 5 % 50 mL IVPB  Status:  Discontinued     2 g 100 mL/hr over 30 Minutes Intravenous 30 min pre-op 10/17/13 0211 10/17/13 0224   10/17/13 0211  vancomycin (VANCOCIN) 1,500 mg in sodium chloride 0.9 % 500 mL IVPB  Status:  Discontinued     1,500 mg 250 mL/hr over 120 Minutes Intravenous 120 min pre-op 10/17/13 0211 10/17/13 0224   10/16/13 2104  vancomycin (VANCOCIN) powder  Status:  Discontinued       As needed 10/16/13 2105 10/16/13 2238   10/16/13 2102  vancomycin (VANCOCIN) 1000 MG powder    Comments:  Aydelette, Jamie   : cabinet override      10/16/13 2102 10/17/13 0914   10/16/13 2022  bacitracin 50,000 Units in sodium chloride irrigation 0.9 % 500 mL irrigation  Status:  Discontinued       As needed 10/16/13 2030 10/16/13 2238   10/16/13 2000  cefTRIAXone (ROCEPHIN) 1 g in dextrose 5 % 50 mL IVPB     1 g 100 mL/hr over 30 Minutes Intravenous To Surgery 10/16/13 1957 10/16/13 2043   10/16/13 1954  vancomycin (VANCOCIN) 1 GM/200ML IVPB    Comments:  Foley, Roger   : cabinet override      10/16/13 1954 10/16/13 2058   10/14/13 1000  levofloxacin ([place])  tablet 500 mg  Status:  Discontinued     500 mg Oral Every 48 hours 10/14/13 0737 10/18/13 1534   10/13/13 1200  cefTRIAXone (ROCEPHIN) 1 g in dextrose 5 % 50 mL IVPB  Status:  Discontinued     1 g 100 mL/hr over 30 Minutes Intravenous Every 24 hours 10/12/13 1325 10/14/13 0737   10/12/13 1045  cefTRIAXone (ROCEPHIN) 1 g in dextrose 5 % 50 mL IVPB  Status:  Discontinued     1 g 100 mL/hr over 30 Minutes Intravenous  Once 10/12/13 1038 10/12/13 1256       PHYSICAL EXAM: Vital signs in last 24 hours: Filed Vitals:   10/20/13 0440 10/20/13 1458 10/20/13 2051 10/21/13 0516  BP: 114/76 127/70 110/67 107/64  Pulse: 82 78 85 80  Temp: 98.2 F (36.8 C) 98 F (36.7 C) 97.9 F (36.6 C) 97.5 F (36.4 C)  TempSrc: Oral Oral Oral Oral  Resp: 18 18 18 18   Height:        Weight: 101.243 kg (223 lb 3.2 oz)   101.4 kg (223 lb 8.7 oz)  SpO2: 96% 99% 97% 97%    Weight change: 0.157 kg (5.5 oz) Filed Weights   10/19/13 0500 10/20/13 0440 10/21/13 0516  Weight: 101.3 kg (223 lb 5.2 oz) 101.243 kg (223 lb 3.2 oz) 101.4 kg (223 lb 8.7 oz)   Body mass index is 36.1 kg/(m^2).   Gen Exam: Awake and alert with clear speech.  Sitting on the side of the bed. Neck: Supple, No JVD.   Chest: B/L Clear.  No wheezes, rales or rhonchi CVS: S1 S2 Regular, no murmurs, gallops or rubs Abdomen: soft, BS +, non tender, non distended.  Extremities: 1+ pitting edema, lower extremities warm to touch. Right leg with decreased strength compared to the left but improved Neurologic: Non Focal.   Skin: No Rash. Small sore on lip - healing.   Intake/Output from previous day:  Intake/Output Summary (Last 24 hours) at 10/21/13 1115 Last data filed at 10/21/13 0639  Gross per 24 hour  Intake    240 ml  Output   2630 ml  Net  -2390 ml   LAB RESULTS: CBC  Recent Labs Lab 10/15/13 0427 10/17/13 1200 10/18/13 0945 10/19/13 0600 10/21/13 0545  WBC 14.3* 14.5* 13.2* 10.1 10.7*  HGB 11.4* 10.3* 9.6* 9.6* 9.8*  HCT 32.9* 29.2* 27.4* 27.6* 28.1*  PLT 296 338 307 311 300  MCV 96.8 97.7 95.8 98.2 97.6  MCH 33.5 34.4* 33.6 34.2* 34.0  MCHC 34.7 35.3 35.0 34.8 34.9  RDW 14.6 15.4 15.4 15.5 16.5*  LYMPHSABS 2.1  --   --   --   --   MONOABS 1.3*  --   --   --   --   EOSABS 0.1  --   --   --   --   BASOSABS 0.0  --   --   --   --     Chemistries   Recent Labs Lab 10/16/13 0412 10/17/13 1200 10/18/13 0945 10/19/13 0600 10/21/13 0545  NA 130* 134* 134* 138 140  K 4.3 4.3 3.3* 3.5* 4.4  CL 101 102 107 107 110  CO2 15* 14* 17* 16* 20  GLUCOSE 200* 290* 164* 136* 68*  BUN 51* 45* 42* 43* 28*  CREATININE 1.87* 1.43* 1.35* 1.36* 1.10  CALCIUM 8.7 8.3* 8.4 9.0 8.8    CBG:  Recent Labs Lab 10/20/13 1219 10/20/13 1738 10/20/13 1829 10/20/13 2204 10/21/13 0746  GLUCAP 127* 68* 84 128* 87    GFR Estimated Creatinine Clearance: 51.5 ml/min (by C-G formula based on Cr of 1.1).   Recent Labs  10/19/13 0600  VITAMINB12 1942*  FOLATE 12.6  FERRITIN 582*  TIBC 140*  IRON 108  RETICCTPCT 3.5*   MICROBIOLOGY: Recent Results (from the past 240 hour(s))  URINE CULTURE     Status: None   Collection Time    10/12/13 10:42 AM      Result Value Ref Range Status   Specimen Description URINE, CATHETERIZED   Final   Special Requests Normal   Final   Culture  Setup Time     Final   Value: 10/12/2013 13:40     Performed at [facility]     Final   Value: >=100,000 COLONIES/ML     Performed at [facility]   Culture     Final   Value: ESCHERICHIA COLI     Performed at [facility]   Report Status 10/14/2013 FINAL   Final   Organism ID, Bacteria ESCHERICHIA COLI   Final  SURGICAL PCR SCREEN     Status: None   Collection Time    10/16/13  6:20 PM      Result Value Ref Range Status   MRSA, PCR NEGATIVE  NEGATIVE Final   Staphylococcus aureus NEGATIVE  NEGATIVE Final   Comment:            The Xpert SA Assay (FDA     approved for NASAL specimens     in patients over 66 years of age),     is one component of     a comprehensive surveillance     program.  Test performance has     been validated by [employer] for patients greater     than or equal to 26 year old.     It is not intended     to diagnose infection nor to     guide or monitor treatment.  CULTURE, ROUTINE-ABSCESS     Status: None   Collection Time    10/16/13  8:42 PM      Result Value Ref Range Status   Specimen Description ABSCESS   Final   Special Requests EPIDURAL ABSCESS SPECIMEN NO 1   Final   Gram Stain     Final   Value: MODERATE WBC PRESENT,BOTH PMN AND MONONUCLEAR     NO SQUAMOUS EPITHELIAL CELLS SEEN     RARE GRAM NEGATIVE RODS     Performed at [facility]   Culture     Final   Value: FEW ESCHERICHIA COLI      Performed at [facility]   Report Status 10/19/2013 FINAL   Final   Organism ID, Bacteria ESCHERICHIA COLI   Final  AFB CULTURE WITH SMEAR     Status: None   Collection Time    10/16/13  8:42 PM      Result Value Ref Range Status   Specimen Description ABSCESS   Final   Special Requests EPIDURAL ABSCESS SPECIMEN NO 1   Final   Acid Fast Smear     Final   Value: NO ACID FAST BACILLI SEEN     Performed at [facility]   Culture     Final   Value: CULTURE WILL BE EXAMINED FOR 6 WEEKS BEFORE ISSUING A FINAL REPORT     Performed at [facility]   Report Status PENDING   Incomplete  FUNGUS CULTURE W SMEAR     Status: None   Collection Time    10/16/13  8:42 PM      Result Value Ref Range Status   Specimen Description ABSCESS   Final   Special Requests EPIDURAL ABSCESS NO 1   Final   Fungal Smear     Final   Value: NO YEAST OR FUNGAL ELEMENTS SEEN     Performed at [facility]   Culture     Final   Value: CULTURE IN PROGRESS FOR FOUR WEEKS     Performed at [facility]   Report Status PENDING   Incomplete  CULTURE, ROUTINE-ABSCESS     Status: None   Collection Time    10/16/13  8:44 PM      Result Value Ref Range Status   Specimen Description ABSCESS   Final   Special Requests EPIDURAL LUMBAR ABSCESS SPECIMEN NO 2   Final   Gram Stain     Final   Value: RARE WBC PRESENT, PREDOMINANTLY MONONUCLEAR     NO SQUAMOUS EPITHELIAL CELLS SEEN     NO ORGANISMS SEEN     Performed at [facility]   Culture     Final   Value: FEW ESCHERICHIA COLI     Performed at [facility]   Report Status 10/19/2013 FINAL   Final   Organism ID, Bacteria ESCHERICHIA COLI   Final    RADIOLOGY STUDIES/RESULTS: Ct Abdomen Pelvis Wo Contrast 10/12/2013   CLINICAL DATA:  Back pain.  EXAM: CT ABDOMEN AND PELVIS WITHOUT CONTRAST  TECHNIQUE: Multidetector CT imaging of the abdomen and pelvis was performed following the standard protocol without IV  contrast.  COMPARISON:  None.  FINDINGS: Few linear densities at lung bases are suggestive for atelectasis or scarring. There is no evidence for free intraperitoneal air.  The liver contour is diffusely nodular with mild enlargement of the left hepatic lobe. Findings are compatible with cirrhosis. There is no large or gross liver lesion. The gallbladder wall has diffuse calcifications and question focal calcifications versus a stone at the gallbladder base. The spleen is normal for size.  There is a 2.2 cm low-density lesion in the left adrenal gland and the Hounsfield units are less than 10. Findings are compatible with a left adrenal adenoma. There may be at least 1 additional adenoma in the left adrenal gland. Small adenoma in the right adrenal gland. Normal appearance of the pancreas, both kidneys and the urinary bladder. No evidence for hydronephrosis or renal stones.  There is severe levoscoliosis in the lumbar spine with multilevel degenerative disease. There is vacuum disc phenomenon with gas bubbles throughout the lumbar spine. No gross abnormality to the uterus or adnexa tissue. There is colonic diverticulosis without acute colonic inflammation. Normal appearance of small bowel without an obstructive process. There is a ventral hernia containing fat.  IMPRESSION: Severe degenerative disease in the lumbar spine with levoscoliosis.  The liver is diffusely nodular and compatible with cirrhosis. No evidence for ascites or splenomegaly.  Diffuse gallbladder wall calcifications. Findings may represent early stages of a porcelain gallbladder. Difficult to exclude a gallstone at the gallbladder base. The gallbladder could be further characterized with ultrasound.  Bilateral adrenal adenomas.   Electronically Signed   By: Markus Daft M.D.   On: 10/12/2013 10:31   Mr Lumbar Spine Wo Contrast 10/16/2013   CLINICAL DATA:  Acute on chronic low  back pain.  EXAM: MRI LUMBAR SPINE WITHOUT CONTRAST  TECHNIQUE:  Multiplanar, multisequence MR imaging of the lumbar spine was performed. No intravenous contrast was administered.  COMPARISON:  CT abdomen and pelvis 10/12/2013  FINDINGS: For the purposes of this dictation, the lowest well-formed intervertebral disc space is presumed to be L5-S1.  Prominent S-shaped thoracolumbar scoliosis is present, convex to the left in the lumbar spine. No significant listhesis is seen. Moderate to severe disc space narrowing is seen throughout the lumbar spine, with relative preservation of the L3-4 disc space. Diffuse degenerative marrow changes are present. No vertebral marrow edema is seen.  There is a dorsal epidural gas and fluid collection extending from L2 to the superior aspect of L4 predominantly in the midline and left of midline. The collection is predominantly fluid, with a small amount of gas superiorly at the L2 level. The amount of gas in the collection has greatly decreased from the recent abdominal CT, however the gas appears to have been replaced by fluid. There is mass effect on the thecal sac, greatest at L2-3 where there is severe spinal stenosis. At the L2 level, the collection measures 13 x 11 mm (transverse by AP). The collection measures 6-6.5 cm in craniocaudal length. A fluid collection posterior to the left L4-5 facet joint measures 11 x 13 x 26 mm. There is also a 10 x 4 mm fluid collection medial to the left L4-5 facet in the epidural space. There is edema in the left-sided posterior paraspinal soft tissues of the mid to lower lumbar spine, greatest about the left L4-5 facet joint. There is also moderate marrow edema about the left L4-5 facet joint.  The tip of the conus medullaris is not clearly localized but is likely near the L1-2 disc space level. There is marked distention of the bladder, incompletely visualized. There is moderate bilateral hydroureteronephrosis.  T11-12: Disc bulge, osteophytic ridging, and moderate facet hypertrophy result in mild to  moderate spinal stenosis and mild left neural foraminal stenosis.  T12-L1: Circumferential disc bulge, osteophytic ridging, and moderate facet hypertrophy result in moderate spinal stenosis, left greater than right lateral recess stenosis, and mild left neural foraminal stenosis.  L1-2: Circumferential disc bulge, osteophytic ridging, and moderate to severe facet hypertrophy result in mild spinal stenosis and mild right neural foraminal stenosis.  L2-3: Severe spinal stenosis predominantly due to the dorsal epidural fluid collection. There is disc bulging and osteophytic ridging asymmetric to the right along with moderate to severe right greater than left facet hypertrophy, resulting in moderate right neural foraminal stenosis.  L3-4: Severe spinal stenosis due to the dorsal epidural fluid collection, mild disc bulging, and severe facet hypertrophy. Mild right neural foraminal stenosis.  L4-5: Mild spinal stenosis due to osteophytic ridging, left lateral epidural fluid collection/synovial cyst, and moderate facet hypertrophy. No significant neural foraminal stenosis.  L5-S1: Left greater than right lateral recess stenosis due to disc bulging, osteophytic ridging, and moderate to severe facet hypertrophy. Mild right and moderate left neural foraminal stenosis.  IMPRESSION: 1. Epidural gas and fluid collection extending from L2 -L4. This contributes to severe spinal stenosis at L2-3 and L3-4 with complete effacement of the thecal sac. The amount of gas has decreased from the prior CT but the amount of fluid appears to have increased. This is concerning for epidural abscess. 2. Small fluid collections adjacent to the left L4-5 facet joint with surrounding marrow and soft tissue edema. These may reflect acute on chronic facet arthritis and synovial cysts, however  septic facet arthritis with associated abscesses is also of concern, particularly given the epidural collection. 3. Marked distention of the bladder with  bilateral hydroureteronephrosis. This could potentially reflect urinary retention due to mass effect on the cauda equina. 4. Thoracolumbar scoliosis with diffuse, severe disc degeneration and facet arthrosis resulting in spinal canal and neural foraminal stenosis as above. These results were called by telephone at the time of interpretation on 10/16/2013 at 5:00 pm to Dr. Louellen Molder , who verbally acknowledged these results.   Electronically Signed   By: Logan Bores   On: 10/16/2013 17:04   US Abdomen Complete 10/12/2013   CLINICAL DATA:  Liver, renal pathology. Abnormal CT. Abdominal pain and flank pain. Elevated alkaline phosphatase and bilirubin.  EXAM: ULTRASOUND ABDOMEN COMPLETE  COMPARISON:  CT of the abdomen and pelvis on 10/12/2013  FINDINGS: Gallbladder:  The gallbladder wall is calcified. Gallbladder wall is 2 mm in thickness. No definite stones are identified. No pericholecystic fluid or sonographic Murphy sign.  Common bile duct:  Diameter: Estimated to be 5 mm. The common bile duct is difficult to visualize in its entire course.  Liver:  The liver appears heterogeneous and nodular, consistent with cirrhosis. No focal liver lesions are identified.  IVC:  The intrahepatic inferior vena cava is not well seen. Visualized portion has a normal appearance.  Pancreas:  Pancreatic tail is not well seen. The visualized portion is normal in appearance.  Spleen:  6.9 cm and normal in appearance.  Right Kidney:  Length: 9.6 cm. Echogenicity within normal limits. No mass or hydronephrosis visualized.  Left Kidney:  Length: 11.7 cm. Echogenicity within normal limits. No mass or hydronephrosis visualized.  Abdominal aorta:  Visualized portion not aneurysmal.  Other findings:  Study is technically difficult because of patient body habitus.  IMPRESSION: 1. Findings consistent with hepatic cirrhosis. No focal mass identified. 2. Porcelain gallbladder. No definite stones identified. No evidence for acute  cholecystitis. 3. No hydronephrosis. 4. Difficult exam because of patient body habitus.   Electronically Signed   By: Shon Hale M.D.   On: 10/12/2013 14:19   Dg Lumbar Spine 1 View 10/16/2013   CLINICAL DATA:  Epidural abscess.  EXAM: LUMBAR SPINE - 1 VIEW  COMPARISON:  MRI dated 10/16/2013  FINDINGS: There is an instrument at the L3-4 level.  IMPRESSION: Instrument at L3-4.   Electronically Signed   By: Rozetta Nunnery M.D.   On: 10/16/2013 20:38   US Renal 10/20/2013   CLINICAL DATA:  Acute renal failure.  EXAM: RENAL/URINARY TRACT ULTRASOUND COMPLETE  COMPARISON:  Ultrasound and CT scans 10/12/2013  FINDINGS: Right Kidney:  Length: 11.2 cm. Mild renal cortical thinning but normal echogenicity. No focal lesions or hydronephrosis.  Left Kidney:  Length: 11.1 cm. Mild renal cortical thinning but normal echogenicity. Slightly prominent extra renal pelvis. No hydronephrosis.  Bladder:  Appears normal for degree of bladder distention.  IMPRESSION: Mild renal cortical thinning but no renal mass or hydronephrosis.   Electronically Signed   By: Kalman Jewels M.D.   On: 10/20/2013 23:40   Dg Chest Port 1 View 10/17/2013   CLINICAL DATA:  Endotracheal tube placement.  EXAM: PORTABLE CHEST - 1 VIEW  COMPARISON:  None.  FINDINGS: The patient's endotracheal tube is seen ending 5 cm above the carina.  The lungs are hypoexpanded. Bibasilar airspace opacities likely reflect atelectasis. Pulmonary vascularity is at the upper limits of normal. No definite pleural effusion or pneumothorax is seen.  The cardiomediastinal silhouette is enlarged.  No acute osseous abnormalities are identified.  IMPRESSION: 1. Endotracheal tube seen ending 5 cm above the carina. 2. Lungs hypoexpanded. Bibasilar airspace opacities likely reflect atelectasis. 3. Cardiomegaly.   Electronically Signed   By: Garald Balding M.D.   On: 10/17/2013 02:19   Shella Spearing [facility] Pager:[phone]  If 7PM-7AM,  please contact night-coverage [email or web address] Password [email or web address] 10/21/2013, 11:15 AM   LOS: 9 days   **Disclaimer: This note may have been dictated with voice recognition software. Similar sounding words can inadvertently be transcribed and this note may contain transcription errors which may not have been corrected upon publication of note.**  Attending Patient was seen, examined,treatment plan was discussed with the Physician extender. I have directly reviewed the clinical findings, lab, imaging studies and management of this patient in detail. I have made the necessary changes to the above noted documentation, and agree with the documentation, as recorded by the Physician extender.  Nena Alexander MD [facility].

## 2013-10-21 NOTE — Progress Notes (Signed)
Patient ID: Tamara Stephenson, female   DOB: 1933/12/16, 80 y.o.   MRN: [id number]  Drain o/p 103ml overnight. Per DrStern, ok to pull drain. Order entered in [place].  Ok from neurosurgical perspective to d/c to rehab.  Verdis Prime RN BSN

## 2013-10-21 NOTE — Plan of Care (Signed)
Problem: Phase III Progression Outcomes Goal: Voiding independently Outcome: Not Met (add Reason) Patient has urinary retention; being discharged with a foley catheter.

## 2013-10-21 NOTE — Progress Notes (Signed)
Report called to Caryl Pina, LPN at [facility]; all questions answered.  Awaiting transportation.

## 2013-10-21 NOTE — Progress Notes (Signed)
Patient transported via [place] to [facility].  IV removed.  Honeycomb dressing intact to back with minimal serosanguinous fluid.  Bruising noted to abdomen and BUE.  Skin tear to right buttock with foam dressing intact.  Patient refusing to take insulin or eat supper prior to discharge; patient states she does not want her blood sugar dropping if she does not eat.

## 2013-10-21 NOTE — Clinical Social Work Note (Signed)
CSW received call from [facility] requiring about transport for patient. CSW scheduled ambulance transport for patient at 3:30PM. DC packet was left on chart. CSW signing off.  Liz Beach MSW, [place], Stella, [phone]

## 2013-10-21 NOTE — Discharge Summary (Addendum)
Physician Discharge Summary  Tamara Stephenson QIW:[id number] DOB: 04/03/1933 DOA: 10/12/2013  PCP: Tamara Rim, MD  Admit date: 10/12/2013 Discharge date: 10/21/2013  Time spent: 60 minutes  Recommendations for Outpatient Follow-up:  1. Voiding trial with Tamara [facility]son as scheduled. 2. Follow up with Neurosurgery for Spinal Abscess and Follow up MRI in 6 weeks. 3. Bmet, cbc in 3- 5 days. 4. Continue Tamara [facility]son thru 9/20. 5.   Recommend GI follow up for porcelain gall bladder  Discharge Diagnoses:  Principal Problem:   Epidural abscess (embolic) of spinal cord Active Problems:   Diabetes mellitus   Hyponatremia   UTI (lower urinary tract infection)   Acute kidney injury   Metabolic acidosis   Hypertension   Fatty liver disease, nonalcoholic   Adrenal adenoma   Porcelain gallbladder   S/P lumbar laminectomy   Anemia   UTI (urinary tract infection)   Discharge Condition: [facility].  Diet recommendation: diabetic.   History of present illness:  80 yo female with pmh of fatty liver and IDDM presented with a 3 week history of intermittent fever.  She reported a long history of low back pain that had recently become worse.  Urine in the ER was positive for UTI.  MRI of her back on 8/12 showed spinal abscess.  [facility] Course:  Epidural Abscess-s/p lumbar laminectomy with chronic low back pain  - Patient reported progressive back pain with lower extremity weakness limiting her mobility at home for several weeks. She was unable to ambulate. MRI lumbar spine without contrast on 8/12 showed severe L3-L4 spinal stenosis with epidural fluid collection and bilateral Hydronephrosis possibly secondary to cauda equina as per radiologist. Consulted neurosurgery, Tamara Stephenson, who had pt transferred to Tamara [facility] and on 8/12 performed L2-L5 decompressive laminectomy with evacuation of epidural abscess. Wound irrigated, vancomycin powder applied topically and an epidural wound vac placed. Intraoperative cultures  are positive for Tamara Stephenson. Seen by ID, recommendations are to continue with Tamara Stephenson for 6 weeks- (10/21/2013) day 8. Remains afebrile. Will require SNF on discharge. Fortunately has improved motor strength of her lower extremities, right lower extremity 3/5, left lower extremity 4/5. Hemovac drain was removed 8/17 prior to discharge.  Tamara Stephenson UTI (urinary tract infection)  -Urine culture positive for Tamara Stephenson (pan sensitive)  -Remains afebrile, very mild leukocytosis 10.7  -Continue Tamara Stephenson  -Denies dysuria or flank pain   Acute Renal Failure  -Resolved.  -In the setting of UTI. Question chronic kidney disease secondary to diabetic nephropathy.  -Patient was taking regular NSAIDs at home. Off NSAIDs. Improved with hydration-continue to check lytes periodically  -Abdominal ultrasound 8/8 demonstrated no hydronephrosis, no nephrolithiasis.  -Serum Creatinine trending down to 4.17 4/08   Metabolic acidosis  -Resolved, Possibly due to RTA  -Replenished with sodium bicarbonate tablets TID  -Follow bicarb with outpatient bmet.  Hyponatremia  -Resolved. Secondary to dehydration.   Chronic Diastolic CHF  -BNP elevated at 1099 on 8/8  -2D echocardiogram shows preserved EF-but does show diastolic dysfunction.  -Mostly compensated-but does have mild lower ext edema.  -Gently diuresed   Diabetes mellitus  -CBG's Stephenson- Tamara Stephenson dose currently 12 units at bedtime and premeal aspart 3 units TID  -A1C of 8.7   Hypertension  -Controlled, most recently borderline hypotensive at 107/64. Currently not on any antihypertensives.  -Given lower leg edema, will not resume Amlodipine on discharge.   Anemia  -Likely secondary to acute illness  -Hgb Stephenson at 9.8  -Monitor periodically outpatient   Fatty liver disease, nonalcoholic  -Continue  ursodiol, monitor.  -Abdominal US 8/8 demonstrated hepatic cirrhosis, no focal mass.   Bilateral adrenal adenomas  -Noted on CT abdomen on 8/8  -Refer to  [facility]; Need to have this reviewed by urologist as an outpatient   Porcelain gallbladder  -Abdominal ultrasound 8/8 demonstrated porcelain gallbladder, no definite stones identified and no evidence  for acute cholecystitis.Belly soft on exam.  -Suggest outpatient GI follow-up for surgical assessment  -Tamara Stephenson prefers to schedule this herself after discharged.   Acute Urinary Retention with bilateral hydronephrosis. -had a foley catheter placed earlier this admission, Renal Ultrasound showed bilateral hydronephroses. Voiding trial attempted, foley removed, however had >800 cc of residuals. -Foley catheter re-inserted on 8/17.  -Outpatient Stephenson appt scheduled for voiding trial.   Procedures:  L2-L5 decompressive laminectomy with evacuation of epidural abscess.  Consultations:  Neurosurgery, ID  Discharge Exam: Filed Vitals:   10/21/13 1329  BP: 106/73  Pulse: 92  Temp: 98.7 F (37.1 C)  Resp: 18   Filed Weights   10/19/13 0500 10/20/13 0440 10/21/13 0516  Weight: 101.3 kg (223 lb 5.2 oz) 101.243 kg (223 lb 3.2 oz) 101.4 kg (223 lb 8.7 oz)    Gen Exam: Awake and alert with clear speech. Sitting on the side of the bed.  Neck: Supple, No JVD.  Chest: B/L Clear. No wheezes, rales or rhonchi  CVS: S1 S2 Regular, no murmurs, gallops or rubs  Abdomen: soft, BS +, non tender, non distended.  Extremities: 1+ pitting edema, lower extremities warm to touch. Right leg with decreased strength compared to the left but improved  Neurologic: Non Focal.  Skin: No Rash. Small sore on lip - healing.    Discharge Instructions       Discharge Instructions   Diet - low sodium heart healthy    Complete by:  As directed      Increase activity slowly    Complete by:  As directed             Medication List    STOP taking these medications       amLODipine 5 MG tablet  Commonly known as:  NORVASC     HYDROcodone-ibuprofen 7.5-200 MG per tablet  Commonly known as:   VICOPROFEN      TAKE these medications       buPROPion 300 MG 24 hr tablet  Commonly known as:  WELLBUTRIN XL  Take 300 mg by mouth daily.     ferrous sulfate 325 (65 FE) MG tablet  Take 325 mg by mouth daily with breakfast.     gabapentin 300 MG capsule  Commonly known as:  NEURONTIN  Take 1 capsule (300 mg total) by mouth at bedtime.     insulin aspart 100 UNIT/ML injection  Commonly known as:  novoLOG  Inject 0-15 Units into the skin 3 (three) times daily with meals.     insulin aspart 100 UNIT/ML injection  Commonly known as:  novoLOG  Inject 3 Units into the skin 3 (three) times daily with meals.     insulin detemir 100 UNIT/ML injection  Commonly known as:  Tamara [facility]son  Inject 0.12 mLs (12 Units total) into the skin at bedtime.     lactulose 10 GM/15ML solution  Commonly known as:  CHRONULAC  Take 30 mLs (20 g total) by mouth 2 (two) times daily.     levofloxacin 500 MG tablet  Commonly known as:  Tamara Stephenson  Take 1 tablet (500 mg total) by mouth daily.  lovastatin 20 MG tablet  Commonly known as:  MEVACOR  Take 20 mg by mouth at bedtime.     oxyCODONE 5 MG immediate release tablet  Commonly known as:  Oxy IR/ROXICODONE  Take 1 tablet (5 mg total) by mouth every 4 (four) hours as needed for severe pain.     polyethylene glycol packet  Commonly known as:  MIRALAX / GLYCOLAX  Take 17 g by mouth daily.     ranitidine 300 MG tablet  Commonly known as:  ZANTAC  Take 300 mg by mouth at bedtime.     ursodiol 300 MG capsule  Commonly known as:  ACTIGALL  Take 300 mg by mouth 3 (three) times daily.     Vitamin D 2000 UNITS tablet  Take 2,000 Units by mouth daily.       Allergies  Allergen Reactions  . Peanut-Containing Drug Products Nausea And Vomiting   Follow-up Information   Follow up with MACDIARMID,SCOTT A, MD On 10/28/2013. (8am appointment on Monday, August 24 at [facility] for voiding trial.)    Specialty:  [facility]   Contact information:    [place] [address] [phone]       Follow up with STERN,JOSEPH D, MD In 1 week. (Follow-up spinal abscess)    Specialty:  Neurosurgery   Contact information:   [address] [place] [address] [phone]        The results of significant diagnostics from this hospitalization (including imaging, microbiology, ancillary and laboratory) are listed below for reference.    Significant Diagnostic Studies: Ct Abdomen Pelvis Wo Contrast  10/12/2013   CLINICAL DATA:  Back pain.  EXAM: CT ABDOMEN AND PELVIS WITHOUT CONTRAST  TECHNIQUE: Multidetector CT imaging of the abdomen and pelvis was performed following the standard protocol without IV contrast.  COMPARISON:  None.  FINDINGS: Few linear densities at lung bases are suggestive for atelectasis or scarring. There is no evidence for free intraperitoneal air.  The liver contour is diffusely nodular with mild enlargement of the left hepatic lobe. Findings are compatible with cirrhosis. There is no large or gross liver lesion. The gallbladder wall has diffuse calcifications and question focal calcifications versus a stone at the gallbladder base. The spleen is normal for size.  There is a 2.2 cm low-density lesion in the left adrenal gland and the Hounsfield units are less than 10. Findings are compatible with a left adrenal adenoma. There may be at least 1 additional adenoma in the left adrenal gland. Small adenoma in the right adrenal gland. Normal appearance of the pancreas, both kidneys and the urinary bladder. No evidence for hydronephrosis or renal stones.  There is severe levoscoliosis in the lumbar spine with multilevel degenerative disease. There is vacuum disc phenomenon with gas bubbles throughout the lumbar spine. No gross abnormality to the uterus or adnexa tissue. There is colonic diverticulosis without acute colonic inflammation. Normal appearance of small bowel without an obstructive process. There is a  ventral hernia containing fat.  IMPRESSION: Severe degenerative disease in the lumbar spine with levoscoliosis.  The liver is diffusely nodular and compatible with cirrhosis. No evidence for ascites or splenomegaly.  Diffuse gallbladder wall calcifications. Findings may represent early stages of a porcelain gallbladder. Difficult to exclude a gallstone at the gallbladder base. The gallbladder could be further characterized with ultrasound.  Bilateral adrenal adenomas.   Electronically Signed   By: Markus Daft M.D.   On: 10/12/2013 10:31   Mr Lumbar  Spine Wo Contrast  10/16/2013   CLINICAL DATA:  Acute on chronic low back pain.  EXAM: MRI LUMBAR SPINE WITHOUT CONTRAST  TECHNIQUE: Multiplanar, multisequence MR imaging of the lumbar spine was performed. No intravenous contrast was administered.  COMPARISON:  CT abdomen and pelvis 10/12/2013  FINDINGS: For the purposes of this dictation, the lowest well-formed intervertebral disc space is presumed to be L5-S1.  Prominent S-shaped thoracolumbar scoliosis is present, convex to the left in the lumbar spine. No significant listhesis is seen. Moderate to severe disc space narrowing is seen throughout the lumbar spine, with relative preservation of the L3-4 disc space. Diffuse degenerative marrow changes are present. No vertebral marrow edema is seen.  There is a dorsal epidural gas and fluid collection extending from L2 to the superior aspect of L4 predominantly in the midline and left of midline. The collection is predominantly fluid, with a small amount of gas superiorly at the L2 level. The amount of gas in the collection has greatly decreased from the recent abdominal CT, however the gas appears to have been replaced by fluid. There is mass effect on the thecal sac, greatest at L2-3 where there is severe spinal stenosis. At the L2 level, the collection measures 13 x 11 mm (transverse by AP). The collection measures 6-6.5 cm in craniocaudal length. A fluid collection  posterior to the left L4-5 facet joint measures 11 x 13 x 26 mm. There is also a 10 x 4 mm fluid collection medial to the left L4-5 facet in the epidural space. There is edema in the left-sided posterior paraspinal soft tissues of the mid to lower lumbar spine, greatest about the left L4-5 facet joint. There is also moderate marrow edema about the left L4-5 facet joint.  The tip of the conus medullaris is not clearly localized but is likely near the L1-2 disc space level. There is marked distention of the bladder, incompletely visualized. There is moderate bilateral hydroureteronephrosis.  T11-12: Disc bulge, osteophytic ridging, and moderate facet hypertrophy result in mild to moderate spinal stenosis and mild left neural foraminal stenosis.  T12-L1: Circumferential disc bulge, osteophytic ridging, and moderate facet hypertrophy result in moderate spinal stenosis, left greater than right lateral recess stenosis, and mild left neural foraminal stenosis.  L1-2: Circumferential disc bulge, osteophytic ridging, and moderate to severe facet hypertrophy result in mild spinal stenosis and mild right neural foraminal stenosis.  L2-3: Severe spinal stenosis predominantly due to the dorsal epidural fluid collection. There is disc bulging and osteophytic ridging asymmetric to the right along with moderate to severe right greater than left facet hypertrophy, resulting in moderate right neural foraminal stenosis.  L3-4: Severe spinal stenosis due to the dorsal epidural fluid collection, mild disc bulging, and severe facet hypertrophy. Mild right neural foraminal stenosis.  L4-5: Mild spinal stenosis due to osteophytic ridging, left lateral epidural fluid collection/synovial cyst, and moderate facet hypertrophy. No significant neural foraminal stenosis.  L5-S1: Left greater than right lateral recess stenosis due to disc bulging, osteophytic ridging, and moderate to severe facet hypertrophy. Mild right and moderate left neural  foraminal stenosis.  IMPRESSION: 1. Epidural gas and fluid collection extending from L2 -L4. This contributes to severe spinal stenosis at L2-3 and L3-4 with complete effacement of the thecal sac. The amount of gas has decreased from the prior CT but the amount of fluid appears to have increased. This is concerning for epidural abscess. 2. Small fluid collections adjacent to the left L4-5 facet joint with surrounding marrow and soft  tissue edema. These may reflect acute on chronic facet arthritis and synovial cysts, however septic facet arthritis with associated abscesses is also of concern, particularly given the epidural collection. 3. Marked distention of the bladder with bilateral hydroureteronephrosis. This could potentially reflect urinary retention due to mass effect on the cauda equina. 4. Thoracolumbar scoliosis with diffuse, severe disc degeneration and facet arthrosis resulting in spinal canal and neural foraminal stenosis as above. These results were called by telephone at the time of interpretation on 10/16/2013 at 5:00 pm to Tamara. Louellen Molder , who verbally acknowledged these results.   Electronically Signed   By: Logan Bores   On: 10/16/2013 17:04   US Abdomen Complete  10/12/2013   CLINICAL DATA:  Liver, renal pathology. Abnormal CT. Abdominal pain and flank pain. Elevated alkaline phosphatase and bilirubin.  EXAM: ULTRASOUND ABDOMEN COMPLETE  COMPARISON:  CT of the abdomen and pelvis on 10/12/2013  FINDINGS: Gallbladder:  The gallbladder wall is calcified. Gallbladder wall is 2 mm in thickness. No definite stones are identified. No pericholecystic fluid or sonographic Murphy sign.  Common bile duct:  Diameter: Estimated to be 5 mm. The common bile duct is difficult to visualize in its entire course.  Liver:  The liver appears heterogeneous and nodular, consistent with cirrhosis. No focal liver lesions are identified.  IVC:  The intrahepatic inferior vena cava is not well seen. Visualized portion  has a normal appearance.  Pancreas:  Pancreatic tail is not well seen. The visualized portion is normal in appearance.  Spleen:  6.9 cm and normal in appearance.  Right Kidney:  Length: 9.6 cm. Echogenicity within normal limits. No mass or hydronephrosis visualized.  Left Kidney:  Length: 11.7 cm. Echogenicity within normal limits. No mass or hydronephrosis visualized.  Abdominal aorta:  Visualized portion not aneurysmal.  Other findings:  Study is technically difficult because of patient body habitus.  IMPRESSION: 1. Findings consistent with hepatic cirrhosis. No focal mass identified. 2. Porcelain gallbladder. No definite stones identified. No evidence for acute cholecystitis. 3. No hydronephrosis. 4. Difficult exam because of patient body habitus.   Electronically Signed   By: Shon Hale M.D.   On: 10/12/2013 14:19   Dg Lumbar Spine 1 View  10/16/2013   CLINICAL DATA:  Epidural abscess.  EXAM: LUMBAR SPINE - 1 VIEW  COMPARISON:  MRI dated 10/16/2013  FINDINGS: There is an instrument at the L3-4 level.  IMPRESSION: Instrument at L3-4.   Electronically Signed   By: Rozetta Nunnery M.D.   On: 10/16/2013 20:38   US Renal  10/20/2013   CLINICAL DATA:  Acute renal failure.  EXAM: RENAL/URINARY TRACT ULTRASOUND COMPLETE  COMPARISON:  Ultrasound and CT scans 10/12/2013  FINDINGS: Right Kidney:  Length: 11.2 cm. Mild renal cortical thinning but normal echogenicity. No focal lesions or hydronephrosis.  Left Kidney:  Length: 11.1 cm. Mild renal cortical thinning but normal echogenicity. Slightly prominent extra renal pelvis. No hydronephrosis.  Bladder:  Appears normal for degree of bladder distention.  IMPRESSION: Mild renal cortical thinning but no renal mass or hydronephrosis.   Electronically Signed   By: Kalman Jewels M.D.   On: 10/20/2013 23:40   Dg Chest Port 1 View  10/17/2013   CLINICAL DATA:  Endotracheal tube placement.  EXAM: PORTABLE CHEST - 1 VIEW  COMPARISON:  None.  FINDINGS: The patient's  endotracheal tube is seen ending 5 cm above the carina.  The lungs are hypoexpanded. Bibasilar airspace opacities likely reflect atelectasis. Pulmonary vascularity is at the  upper limits of normal. No definite pleural effusion or pneumothorax is seen.  The cardiomediastinal silhouette is enlarged. No acute osseous abnormalities are identified.  IMPRESSION: 1. Endotracheal tube seen ending 5 cm above the carina. 2. Lungs hypoexpanded. Bibasilar airspace opacities likely reflect atelectasis. 3. Cardiomegaly.   Electronically Signed   By: Garald Balding M.D.   On: 10/17/2013 02:19    Microbiology: Recent Results (from the past 240 hour(s))  URINE CULTURE     Status: None   Collection Time    10/12/13 10:42 AM      Result Value Ref Range Status   Specimen Description URINE, CATHETERIZED   Final   Special Requests Normal   Final   Culture  Setup Time     Final   Value: 10/12/2013 13:40     Performed at [place]     Final   Value: >=100,000 COLONIES/ML     Performed at [facility]   Culture     Final   Value: ESCHERICHIA [facility]     Performed at [facility]   Report Status 10/14/2013 FINAL   Final   Organism ID, Bacteria ESCHERICHIA Stephenson   Final  SURGICAL PCR SCREEN     Status: None   Collection Time    10/16/13  6:20 PM      Result Value Ref Range Status   MRSA, PCR NEGATIVE  NEGATIVE Final   Staphylococcus aureus NEGATIVE  NEGATIVE Final   Comment:            The Xpert SA Assay (FDA     approved for NASAL specimens     in patients over 106 years of age),     is one component of     a comprehensive surveillance     program.  Test performance has     been validated by [employer] for patients greater     than or equal to 30 year old.     It is not intended     to diagnose infection nor to     guide or monitor treatment.  CULTURE, ROUTINE-ABSCESS     Status: None   Collection Time    10/16/13  8:42 PM      Result Value Ref Range Status    Specimen Description ABSCESS   Final   Special Requests EPIDURAL ABSCESS SPECIMEN NO 1   Final   Gram Stain     Final   Value: MODERATE WBC PRESENT,BOTH PMN AND MONONUCLEAR     NO SQUAMOUS EPITHELIAL CELLS SEEN     RARE GRAM NEGATIVE RODS     Performed at [facility]   Culture     Final   Value: FEW ESCHERICHIA Stephenson     Performed at [facility]   Report Status 10/19/2013 FINAL   Final   Organism ID, Bacteria ESCHERICHIA [facility]son   Final  AFB CULTURE WITH SMEAR     Status: None   Collection Time    10/16/13  8:42 PM      Result Value Ref Range Status   Specimen Description ABSCESS   Final   Special Requests EPIDURAL ABSCESS SPECIMEN NO 1   Final   Acid Fast Smear     Final   Value: NO ACID FAST BACILLI SEEN     Performed at [facility]   Culture     Final   Value: CULTURE WILL BE  EXAMINED FOR 6 WEEKS BEFORE ISSUING A FINAL REPORT     Performed at [facility]   Report Status PENDING   Incomplete  FUNGUS CULTURE W SMEAR     Status: None   Collection Time    10/16/13  8:42 PM      Result Value Ref Range Status   Specimen Description ABSCESS   Final   Special Requests EPIDURAL ABSCESS NO 1   Final   Fungal Smear     Final   Value: NO YEAST OR FUNGAL ELEMENTS SEEN     Performed at [facility]   Culture     Final   Value: CULTURE IN PROGRESS FOR FOUR WEEKS     Performed at [facility]   Report Status PENDING   Incomplete  CULTURE, ROUTINE-ABSCESS     Status: None   Collection Time    10/16/13  8:44 PM      Result Value Ref Range Status   Specimen Description ABSCESS   Final   Special Requests EPIDURAL LUMBAR ABSCESS SPECIMEN NO 2   Final   Gram Stain     Final   Value: RARE WBC PRESENT, PREDOMINANTLY MONONUCLEAR     NO SQUAMOUS EPITHELIAL CELLS SEEN     NO ORGANISMS SEEN     Performed at [facility]   Culture     Final   Value: FEW ESCHERICHIA [facility]son     Performed at [facility]   Report Status  10/19/2013 FINAL   Final   Organism ID, Bacteria ESCHERICHIA [facility]son   Final     Labs: Basic Metabolic Panel:  Recent Labs Lab 10/16/13 0412 10/17/13 1200 10/18/13 0945 10/19/13 0600 10/21/13 0545  NA 130* 134* 134* 138 140  K 4.3 4.3 3.3* 3.5* 4.4  CL 101 102 107 107 110  CO2 15* 14* 17* 16* 20  GLUCOSE 200* 290* 164* 136* 68*  BUN 51* 45* 42* 43* 28*  CREATININE 1.87* 1.43* 1.35* 1.36* 1.10  CALCIUM 8.7 8.3* 8.4 9.0 8.8   CBC:  Recent Labs Lab 10/15/13 0427 10/17/13 1200 10/18/13 0945 10/19/13 0600 10/21/13 0545  WBC 14.3* 14.5* 13.2* 10.1 10.7*  NEUTROABS 10.8*  --   --   --   --   HGB 11.4* 10.3* 9.6* 9.6* 9.8*  HCT 32.9* 29.2* 27.4* 27.6* 28.1*  MCV 96.8 97.7 95.8 98.2 97.6  PLT 296 338 307 311 300    BNP (last 3 results)  Recent Labs  10/12/13 1251  PROBNP 1099.0*   CBG:  Recent Labs Lab 10/20/13 1738 10/20/13 1829 10/20/13 2204 10/21/13 0746 10/21/13 1133  GLUCAP 68* 84 128* 87 118*   SignedKaren Kitchens [phone]  [facility] 10/21/2013, 2:40 PM  Attending Patient was seen, examined,treatment plan was discussed with the Physician extender. I have directly reviewed the clinical findings, lab, imaging studies and management of this patient in detail. I have made the necessary changes to the above noted documentation, and agree with the documentation, as recorded by the Physician extender.  Nena Alexander MD [facility].

## 2013-10-21 NOTE — Progress Notes (Signed)
Physical Therapy Treatment Patient Details Name: Tamara Stephenson MRN: [id number] DOB: March 14, 1933 Today's Date: 10/21/2013    History of Present Illness 80 yo female admitted with DM, back pain, inability to walk. Pt underwent  10/16/13 laminectomy L2-4. Pt dx with UTI and severe back pain.  Hx of DM, back pain, HTN. Pt is from [place] [facility] [place]    PT Comments    Patient able to tolerate more activity/exercise today due to taking pain medications prior to PT treatment. Continues to be anxious and fearful of falling which may be limiting mobility. Marked weakness in BLEs, Rt>lft which is also limiting pt's ability to stand. Sitting balance and trunk control improved today as pt able to sit EOB to eat lunch without support. Will continue to follow.   Follow Up Recommendations  SNF;Supervision/Assistance - 24 hour     Equipment Recommendations  None recommended by PT    Recommendations for Other Services       Precautions / Restrictions Precautions Precautions: Fall;Back Precaution Comments: reviewed back precautions with patient for comfort with mobility Restrictions Weight Bearing Restrictions: No    Mobility  Bed Mobility Overal bed mobility: Needs Assistance;+2 for physical assistance Bed Mobility: Rolling;Sidelying to Sit Rolling: Mod assist Sidelying to sit: Max assist;HOB elevated       General bed mobility comments: Requires VC for step by step sequencing. Assist mobilizing RLE to EOB, assist with trunk and bottom. Able to scoot along side bed to right with Mod A with bottom and VC for technique emphasizing anterior trunk lean.  Transfers Overall transfer level: Needs assistance Equipment used: Rolling walker (2 wheeled) Transfers: Sit to/from Stand Sit to Stand: Total assist;+2 physical assistance;From elevated surface         General transfer comment: Able to clear bottom x2 and perform half standing with elevated bed height. VC for hip extension and BUEs  extension on RW. VC for hand placement each repetition. Unable to stand fully upright. Pt fearful of falling.  Ambulation/Gait                 Stairs            Wheelchair Mobility    Modified Rankin (Stroke Patients Only)       Balance Overall balance assessment: Needs assistance   Sitting balance-Leahy Scale: Fair Sitting balance - Comments: Required UE support initially progressing to no UE support sitting EOB unsupported. UE support required during there ex EOB. Stayed sitting EOB for lunch. Required assist mobilizing bottom to EOB and with scooting Mod A.   Standing balance support: During functional activity Standing balance-Leahy Scale: Zero                      Cognition Arousal/Alertness: Awake/alert Behavior During Therapy: WFL for tasks assessed/performed Overall Cognitive Status: Within Functional Limits for tasks assessed                      Exercises General Exercises - Lower Extremity Ankle Circles/Pumps: Left;15 reps;Seated (not able to perform ankle ROM on right today.) Long Arc Quad: Both;10 reps;Seated (x2 sets) Hip Flexion/Marching: Both;10 reps;Seated (x2 sets)    General Comments General comments (skin integrity, edema, etc.): Hemovac removed. RN gave dilaudid prior to PT treatment per request.      Pertinent Vitals/Pain Pain Assessment: 0-10 Pain Score: 4  Pain Location: low back Pain Descriptors / Indicators: Sore;Discomfort Pain Intervention(s): Monitored during session;Premedicated before session;Repositioned;Limited activity within patient's tolerance  Home Living                      Prior Function            PT Goals (current goals can now be found in the care plan section) Progress towards PT goals: Progressing toward goals    Frequency  Min 3X/week    PT Plan Current plan remains appropriate    Co-evaluation             End of Session Equipment Utilized During Treatment: Gait  belt Activity Tolerance: Patient limited by fatigue;Patient limited by pain Patient left: in bed;with call bell/phone within reach;with bed alarm set (sitting EOB with tray in front. RN made aware.)     Time: 5993-5701 PT Time Calculation (min): 30 min  Charges:  $Therapeutic Exercise: 8-22 mins $Therapeutic Activity: 8-22 mins                    G CodesCandy Sledge A 17-Nov-2013, 2:36 PM Candy Sledge, [place], DPT [phone]

## 2013-10-25 NOTE — Anesthesia Postprocedure Evaluation (Signed)
  Anesthesia Post-op Note  Patient: Tamara Stephenson  Procedure(s) Performed: Procedure(s): LUMBAR LAMINECTOMY FOR EPIDURAL ABSCESS Lumbar Two through Four (N/A)  Patient Location: PACU  Anesthesia Type:General  Level of Consciousness: sedated, responds to stimulation and Patient remains intubated per anesthesia plan  Airway and Oxygen Therapy: Patient remains intubated per anesthesia plan  Post-op Pain: none  Post-op Assessment: Post-op Vital signs reviewed, Patient's Cardiovascular Status Stable and Respiratory Function Stable  Post-op Vital Signs: Reviewed and stable  Last Vitals:  Filed Vitals:   10/21/13 1329  BP: 106/73  Pulse: 92  Temp: 37.1 C  Resp: 18    Complications: Pt has persistent weakness likely due to residual muscle relaxant.  Will continue to monitor for appropriate time for extubation.

## 2013-11-15 LAB — FUNGUS CULTURE W SMEAR: Fungal Smear: NONE SEEN

## 2013-11-30 LAB — AFB CULTURE WITH SMEAR (NOT AT ARMC): Acid Fast Smear: NONE SEEN

## 2014-07-22 ENCOUNTER — Emergency Department (HOSPITAL_COMMUNITY)
Admission: EM | Admit: 2014-07-22 | Discharge: 2014-07-22 | Discharge status: Against Medical Advice | Payer: Medicare HMO | Attending: Emergency Medicine | Admitting: Emergency Medicine

## 2014-07-22 ENCOUNTER — Encounter (HOSPITAL_COMMUNITY): Payer: Self-pay | Admitting: Emergency Medicine

## 2014-07-22 DIAGNOSIS — R443 Hallucinations, unspecified: Secondary | ICD-10-CM | POA: Diagnosis present

## 2014-07-22 DIAGNOSIS — I10 Essential (primary) hypertension: Secondary | ICD-10-CM | POA: Insufficient documentation

## 2014-07-22 DIAGNOSIS — E119 Type 2 diabetes mellitus without complications: Secondary | ICD-10-CM | POA: Insufficient documentation

## 2014-07-22 LAB — CBC WITH DIFFERENTIAL/PLATELET
Basophils Absolute: 0.1 10*3/uL (ref 0.0–0.1)
Basophils Relative: 1 % (ref 0–1)
Eosinophils Absolute: 0.5 10*3/uL (ref 0.0–0.7)
Eosinophils Relative: 8 % — ABNORMAL HIGH (ref 0–5)
HEMATOCRIT: 36.8 % (ref 36.0–46.0)
HEMOGLOBIN: 12.2 g/dL (ref 12.0–15.0)
LYMPHS ABS: 2.7 10*3/uL (ref 0.7–4.0)
LYMPHS PCT: 40 % (ref 12–46)
MCH: 32.7 pg (ref 26.0–34.0)
MCHC: 33.2 g/dL (ref 30.0–36.0)
MCV: 98.7 fL (ref 78.0–100.0)
MONO ABS: 0.7 10*3/uL (ref 0.1–1.0)
MONOS PCT: 10 % (ref 3–12)
NEUTROS ABS: 2.8 10*3/uL (ref 1.7–7.7)
NEUTROS PCT: 41 % — AB (ref 43–77)
Platelets: 180 10*3/uL (ref 150–400)
RBC: 3.73 MIL/uL — AB (ref 3.87–5.11)
RDW: 14.9 % (ref 11.5–15.5)
WBC: 6.8 10*3/uL (ref 4.0–10.5)

## 2014-07-22 LAB — I-STAT CHEM 8, ED
BUN: 25 mg/dL — ABNORMAL HIGH (ref 6–20)
Calcium, Ion: 1.24 mmol/L (ref 1.13–1.30)
Chloride: 105 mmol/L (ref 101–111)
Creatinine, Ser: 1.6 mg/dL — ABNORMAL HIGH (ref 0.44–1.00)
Glucose, Bld: 171 mg/dL — ABNORMAL HIGH (ref 65–99)
HCT: 39 % (ref 36.0–46.0)
HEMOGLOBIN: 13.3 g/dL (ref 12.0–15.0)
Potassium: 3.9 mmol/L (ref 3.5–5.1)
SODIUM: 145 mmol/L (ref 135–145)
TCO2: 22 mmol/L (ref 0–100)

## 2014-07-22 NOTE — ED Notes (Signed)
Pts aide came to desk states she is unable to stay any longer. Pts aide states she will bring pt back to ED or take to urgent care tomorrow.

## 2014-07-22 NOTE — ED Notes (Signed)
Pt's friend st's pt has been having hallucinations today.  St's last time this happened she had a UTI.  Pt alert and oriented x's 3 at this time.  Pt only c/o urine being dark

## 2014-09-15 ENCOUNTER — Emergency Department (HOSPITAL_COMMUNITY): Payer: Medicare HMO

## 2014-09-15 ENCOUNTER — Inpatient Hospital Stay (HOSPITAL_COMMUNITY)
Admission: EM | Admit: 2014-09-15 | Discharge: 2014-09-20 | DRG: 603 | Disposition: A | Discharge status: Skilled Nursing Facility | Payer: Medicare HMO | Attending: Internal Medicine | Admitting: Internal Medicine

## 2014-09-15 ENCOUNTER — Encounter (HOSPITAL_COMMUNITY): Payer: Self-pay | Admitting: Internal Medicine

## 2014-09-15 ENCOUNTER — Inpatient Hospital Stay (HOSPITAL_COMMUNITY): Payer: Medicare HMO

## 2014-09-15 DIAGNOSIS — M25529 Pain in unspecified elbow: Secondary | ICD-10-CM | POA: Diagnosis present

## 2014-09-15 DIAGNOSIS — R531 Weakness: Secondary | ICD-10-CM | POA: Diagnosis present

## 2014-09-15 DIAGNOSIS — L03115 Cellulitis of right lower limb: Secondary | ICD-10-CM | POA: Diagnosis present

## 2014-09-15 DIAGNOSIS — R1310 Dysphagia, unspecified: Secondary | ICD-10-CM | POA: Diagnosis present

## 2014-09-15 DIAGNOSIS — D509 Iron deficiency anemia, unspecified: Secondary | ICD-10-CM | POA: Diagnosis present

## 2014-09-15 DIAGNOSIS — E785 Hyperlipidemia, unspecified: Secondary | ICD-10-CM | POA: Diagnosis present

## 2014-09-15 DIAGNOSIS — M7989 Other specified soft tissue disorders: Secondary | ICD-10-CM | POA: Diagnosis present

## 2014-09-15 DIAGNOSIS — E089 Diabetes mellitus due to underlying condition without complications: Secondary | ICD-10-CM | POA: Diagnosis not present

## 2014-09-15 DIAGNOSIS — E1121 Type 2 diabetes mellitus with diabetic nephropathy: Secondary | ICD-10-CM | POA: Diagnosis present

## 2014-09-15 DIAGNOSIS — R609 Edema, unspecified: Secondary | ICD-10-CM | POA: Diagnosis present

## 2014-09-15 DIAGNOSIS — K760 Fatty (change of) liver, not elsewhere classified: Secondary | ICD-10-CM | POA: Diagnosis present

## 2014-09-15 DIAGNOSIS — F329 Major depressive disorder, single episode, unspecified: Secondary | ICD-10-CM | POA: Diagnosis present

## 2014-09-15 DIAGNOSIS — E1140 Type 2 diabetes mellitus with diabetic neuropathy, unspecified: Secondary | ICD-10-CM | POA: Diagnosis present

## 2014-09-15 DIAGNOSIS — L03119 Cellulitis of unspecified part of limb: Secondary | ICD-10-CM

## 2014-09-15 DIAGNOSIS — L8990 Pressure ulcer of unspecified site, unspecified stage: Secondary | ICD-10-CM

## 2014-09-15 DIAGNOSIS — N183 Chronic kidney disease, stage 3 unspecified: Secondary | ICD-10-CM | POA: Diagnosis present

## 2014-09-15 DIAGNOSIS — Z79891 Long term (current) use of opiate analgesic: Secondary | ICD-10-CM | POA: Diagnosis not present

## 2014-09-15 DIAGNOSIS — E1165 Type 2 diabetes mellitus with hyperglycemia: Secondary | ICD-10-CM

## 2014-09-15 DIAGNOSIS — I10 Essential (primary) hypertension: Secondary | ICD-10-CM | POA: Diagnosis present

## 2014-09-15 DIAGNOSIS — L89892 Pressure ulcer of other site, stage 2: Secondary | ICD-10-CM | POA: Diagnosis present

## 2014-09-15 DIAGNOSIS — L03113 Cellulitis of right upper limb: Secondary | ICD-10-CM | POA: Diagnosis not present

## 2014-09-15 DIAGNOSIS — Z9889 Other specified postprocedural states: Secondary | ICD-10-CM

## 2014-09-15 DIAGNOSIS — E1129 Type 2 diabetes mellitus with other diabetic kidney complication: Secondary | ICD-10-CM | POA: Diagnosis present

## 2014-09-15 DIAGNOSIS — Z66 Do not resuscitate: Secondary | ICD-10-CM | POA: Diagnosis present

## 2014-09-15 DIAGNOSIS — Z993 Dependence on wheelchair: Secondary | ICD-10-CM | POA: Diagnosis not present

## 2014-09-15 DIAGNOSIS — D638 Anemia in other chronic diseases classified elsewhere: Secondary | ICD-10-CM | POA: Diagnosis not present

## 2014-09-15 DIAGNOSIS — R062 Wheezing: Secondary | ICD-10-CM

## 2014-09-15 DIAGNOSIS — E119 Type 2 diabetes mellitus without complications: Secondary | ICD-10-CM

## 2014-09-15 DIAGNOSIS — E669 Obesity, unspecified: Secondary | ICD-10-CM | POA: Diagnosis present

## 2014-09-15 DIAGNOSIS — E66811 Obesity, class 1: Secondary | ICD-10-CM | POA: Diagnosis present

## 2014-09-15 DIAGNOSIS — Z91010 Allergy to peanuts: Secondary | ICD-10-CM

## 2014-09-15 DIAGNOSIS — Z806 Family history of leukemia: Secondary | ICD-10-CM | POA: Diagnosis not present

## 2014-09-15 DIAGNOSIS — L03116 Cellulitis of left lower limb: Secondary | ICD-10-CM | POA: Diagnosis present

## 2014-09-15 DIAGNOSIS — I129 Hypertensive chronic kidney disease with stage 1 through stage 4 chronic kidney disease, or unspecified chronic kidney disease: Secondary | ICD-10-CM | POA: Diagnosis present

## 2014-09-15 DIAGNOSIS — D631 Anemia in chronic kidney disease: Secondary | ICD-10-CM | POA: Diagnosis present

## 2014-09-15 DIAGNOSIS — L0390 Cellulitis, unspecified: Secondary | ICD-10-CM

## 2014-09-15 DIAGNOSIS — F17210 Nicotine dependence, cigarettes, uncomplicated: Secondary | ICD-10-CM | POA: Diagnosis present

## 2014-09-15 DIAGNOSIS — E1122 Type 2 diabetes mellitus with diabetic chronic kidney disease: Secondary | ICD-10-CM | POA: Diagnosis present

## 2014-09-15 DIAGNOSIS — Z79899 Other long term (current) drug therapy: Secondary | ICD-10-CM

## 2014-09-15 DIAGNOSIS — Z139 Encounter for screening, unspecified: Secondary | ICD-10-CM

## 2014-09-15 DIAGNOSIS — Z794 Long term (current) use of insulin: Secondary | ICD-10-CM | POA: Diagnosis not present

## 2014-09-15 DIAGNOSIS — G8929 Other chronic pain: Secondary | ICD-10-CM | POA: Diagnosis present

## 2014-09-15 DIAGNOSIS — Z6836 Body mass index (BMI) 36.0-36.9, adult: Secondary | ICD-10-CM

## 2014-09-15 DIAGNOSIS — IMO0002 Reserved for concepts with insufficient information to code with codable children: Secondary | ICD-10-CM | POA: Diagnosis present

## 2014-09-15 DIAGNOSIS — D649 Anemia, unspecified: Secondary | ICD-10-CM | POA: Diagnosis present

## 2014-09-15 DIAGNOSIS — M549 Dorsalgia, unspecified: Secondary | ICD-10-CM | POA: Diagnosis present

## 2014-09-15 DIAGNOSIS — E876 Hypokalemia: Secondary | ICD-10-CM | POA: Diagnosis present

## 2014-09-15 DIAGNOSIS — F32A Depression, unspecified: Secondary | ICD-10-CM | POA: Diagnosis present

## 2014-09-15 DIAGNOSIS — L8992 Pressure ulcer of unspecified site, stage 2: Secondary | ICD-10-CM | POA: Diagnosis present

## 2014-09-15 LAB — COMPREHENSIVE METABOLIC PANEL
ALBUMIN: 2.5 g/dL — AB (ref 3.5–5.0)
ALT: 18 U/L (ref 14–54)
AST: 35 U/L (ref 15–41)
Alkaline Phosphatase: 212 U/L — ABNORMAL HIGH (ref 38–126)
Anion gap: 7 (ref 5–15)
BUN: 15 mg/dL (ref 6–20)
CO2: 21 mmol/L — ABNORMAL LOW (ref 22–32)
Calcium: 8.2 mg/dL — ABNORMAL LOW (ref 8.9–10.3)
Chloride: 107 mmol/L (ref 101–111)
Creatinine, Ser: 1.14 mg/dL — ABNORMAL HIGH (ref 0.44–1.00)
GFR calc non Af Amer: 45 mL/min — ABNORMAL LOW (ref >60)
GFR, EST AFRICAN AMERICAN: 52 mL/min — AB (ref >60)
Glucose, Bld: 207 mg/dL — ABNORMAL HIGH (ref 65–99)
POTASSIUM: 3.3 mmol/L — AB (ref 3.5–5.1)
SODIUM: 135 mmol/L (ref 135–145)
TOTAL PROTEIN: 7.4 g/dL (ref 6.5–8.1)
Total Bilirubin: 1.4 mg/dL — ABNORMAL HIGH (ref 0.3–1.2)

## 2014-09-15 LAB — GLUCOSE, CAPILLARY
GLUCOSE-CAPILLARY: 147 mg/dL — AB (ref 65–99)
Glucose-Capillary: 97 mg/dL (ref 65–99)
Glucose-Capillary: 128 mg/dL — ABNORMAL HIGH (ref 65–99)
Glucose-Capillary: 85 mg/dL (ref 65–99)

## 2014-09-15 LAB — CBC WITH DIFFERENTIAL/PLATELET
BASOS ABS: 0.1 10*3/uL (ref 0.0–0.1)
BASOS PCT: 1 % (ref 0–1)
EOS ABS: 0.5 10*3/uL (ref 0.0–0.7)
Eosinophils Relative: 5 % (ref 0–5)
HCT: 35.1 % — ABNORMAL LOW (ref 36.0–46.0)
HEMOGLOBIN: 11.6 g/dL — AB (ref 12.0–15.0)
Lymphocytes Relative: 19 % (ref 12–46)
Lymphs Abs: 1.7 10*3/uL (ref 0.7–4.0)
MCH: 33.8 pg (ref 26.0–34.0)
MCHC: 33 g/dL (ref 30.0–36.0)
MCV: 102.3 fL — AB (ref 78.0–100.0)
MONOS PCT: 13 % — AB (ref 3–12)
Monocytes Absolute: 1.2 10*3/uL — ABNORMAL HIGH (ref 0.1–1.0)
NEUTROS ABS: 5.5 10*3/uL (ref 1.7–7.7)
Neutrophils Relative %: 62 % (ref 43–77)
Platelets: 204 10*3/uL (ref 150–400)
RBC: 3.43 MIL/uL — AB (ref 3.87–5.11)
RDW: 15.3 % (ref 11.5–15.5)
WBC: 8.9 10*3/uL (ref 4.0–10.5)

## 2014-09-15 LAB — URINALYSIS, ROUTINE W REFLEX MICROSCOPIC
BILIRUBIN URINE: NEGATIVE
Glucose, UA: 100 mg/dL — AB
Hgb urine dipstick: NEGATIVE
Ketones, ur: NEGATIVE mg/dL
LEUKOCYTES UA: NEGATIVE
Nitrite: NEGATIVE
Protein, ur: NEGATIVE mg/dL
SPECIFIC GRAVITY, URINE: 1.011 (ref 1.005–1.030)
Urobilinogen, UA: 4 mg/dL — ABNORMAL HIGH (ref 0.0–1.0)
pH: 6.5 (ref 5.0–8.0)

## 2014-09-15 LAB — MAGNESIUM: Magnesium: 1.8 mg/dL (ref 1.7–2.4)

## 2014-09-15 LAB — SEDIMENTATION RATE: Sed Rate: 39 mm/hr — ABNORMAL HIGH (ref 0–22)

## 2014-09-15 LAB — C-REACTIVE PROTEIN: CRP: 4.6 mg/dL — AB (ref <1.0)

## 2014-09-15 LAB — I-STAT CG4 LACTIC ACID, ED: Lactic Acid, Venous: 1.2 mmol/L (ref 0.5–2.0)

## 2014-09-15 LAB — HIV ANTIBODY (ROUTINE TESTING W REFLEX): HIV Screen 4th Generation wRfx: NONREACTIVE

## 2014-09-15 MED ORDER — POLYETHYLENE GLYCOL 3350 17 G PO PACK: 17.0000 g | PACK | Freq: Every day | ORAL | Status: DC | PRN

## 2014-09-15 MED ORDER — PANTOPRAZOLE SODIUM 40 MG PO TBEC
40.0000 mg | DELAYED_RELEASE_TABLET | Freq: Every day | ORAL | Status: DC
  Administered 2014-09-19: 40 mg via ORAL
  Filled 2014-09-15 (×6): qty 1

## 2014-09-15 MED ORDER — VANCOMYCIN HCL IN DEXTROSE 1-5 GM/200ML-% IV SOLN
1000.0000 mg | Freq: Once | INTRAVENOUS | Status: AC
  Administered 2014-09-15: 1000 mg via INTRAVENOUS
  Filled 2014-09-15: qty 200

## 2014-09-15 MED ORDER — URSODIOL 300 MG PO CAPS
300.0000 mg | ORAL_CAPSULE | Freq: Three times a day (TID) | ORAL | Status: DC
  Administered 2014-09-15 – 2014-09-20 (×16): 300 mg via ORAL
  Filled 2014-09-15 (×18): qty 1

## 2014-09-15 MED ORDER — BUPROPION HCL ER (XL) 300 MG PO TB24
300.0000 mg | ORAL_TABLET | Freq: Every day | ORAL | Status: DC
  Administered 2014-09-15 – 2014-09-20 (×6): 300 mg via ORAL
  Filled 2014-09-15 (×6): qty 1

## 2014-09-15 MED ORDER — INSULIN ASPART 100 UNIT/ML ~~LOC~~ SOLN
0.0000 [IU] | Freq: Three times a day (TID) | SUBCUTANEOUS | Status: DC
  Administered 2014-09-17: 3 [IU] via SUBCUTANEOUS
  Administered 2014-09-18: 4 [IU] via SUBCUTANEOUS
  Administered 2014-09-18 – 2014-09-19 (×4): 3 [IU] via SUBCUTANEOUS
  Administered 2014-09-20: 4 [IU] via SUBCUTANEOUS

## 2014-09-15 MED ORDER — SORBITOL 70 % SOLN
30.0000 mL | Freq: Every day | Status: DC | PRN
  Filled 2014-09-15: qty 30

## 2014-09-15 MED ORDER — FUROSEMIDE 10 MG/ML IJ SOLN
20.0000 mg | Freq: Once | INTRAMUSCULAR | Status: AC
  Administered 2014-09-15: 20 mg via INTRAVENOUS
  Filled 2014-09-15: qty 2

## 2014-09-15 MED ORDER — ALBUTEROL SULFATE (2.5 MG/3ML) 0.083% IN NEBU
2.5000 mg | INHALATION_SOLUTION | RESPIRATORY_TRACT | Status: DC | PRN
  Administered 2014-09-19: 2.5 mg via RESPIRATORY_TRACT
  Filled 2014-09-15: qty 3

## 2014-09-15 MED ORDER — FENTANYL CITRATE (PF) 100 MCG/2ML IJ SOLN
50.0000 ug | Freq: Once | INTRAMUSCULAR | Status: AC
  Administered 2014-09-15: 50 ug via INTRAVENOUS
  Filled 2014-09-15: qty 2

## 2014-09-15 MED ORDER — VANCOMYCIN HCL 500 MG IV SOLR
500.0000 mg | Freq: Once | INTRAVENOUS | Status: AC
  Administered 2014-09-15: 500 mg via INTRAVENOUS
  Filled 2014-09-15: qty 500

## 2014-09-15 MED ORDER — ENOXAPARIN SODIUM 40 MG/0.4ML ~~LOC~~ SOLN
40.0000 mg | SUBCUTANEOUS | Status: DC
  Administered 2014-09-15 – 2014-09-19 (×5): 40 mg via SUBCUTANEOUS
  Filled 2014-09-15 (×6): qty 0.4

## 2014-09-15 MED ORDER — PRAVASTATIN SODIUM 20 MG PO TABS
20.0000 mg | ORAL_TABLET | Freq: Every day | ORAL | Status: DC
  Administered 2014-09-15 – 2014-09-19 (×5): 20 mg via ORAL
  Filled 2014-09-15 (×6): qty 1

## 2014-09-15 MED ORDER — INSULIN DETEMIR 100 UNIT/ML ~~LOC~~ SOLN
12.0000 [IU] | Freq: Every day | SUBCUTANEOUS | Status: DC
  Administered 2014-09-15: 12 [IU] via SUBCUTANEOUS
  Filled 2014-09-15: qty 0.12

## 2014-09-15 MED ORDER — ACETAMINOPHEN 650 MG RE SUPP: 650.0000 mg | Freq: Four times a day (QID) | RECTAL | Status: DC | PRN

## 2014-09-15 MED ORDER — POTASSIUM CHLORIDE CRYS ER 20 MEQ PO TBCR
40.0000 meq | EXTENDED_RELEASE_TABLET | Freq: Once | ORAL | Status: AC
  Administered 2014-09-15: 40 meq via ORAL
  Filled 2014-09-15: qty 2

## 2014-09-15 MED ORDER — GABAPENTIN 300 MG PO CAPS
300.0000 mg | ORAL_CAPSULE | Freq: Every day | ORAL | Status: DC
  Administered 2014-09-15 – 2014-09-19 (×5): 300 mg via ORAL
  Filled 2014-09-15 (×6): qty 1

## 2014-09-15 MED ORDER — FERROUS SULFATE 325 (65 FE) MG PO TABS
325.0000 mg | ORAL_TABLET | Freq: Every day | ORAL | Status: DC
  Administered 2014-09-15 – 2014-09-20 (×6): 325 mg via ORAL
  Filled 2014-09-15 (×8): qty 1

## 2014-09-15 MED ORDER — LACTULOSE 10 GM/15ML PO SOLN
20.0000 g | Freq: Two times a day (BID) | ORAL | Status: DC
  Administered 2014-09-16 – 2014-09-19 (×4): 20 g via ORAL
  Filled 2014-09-15 (×12): qty 30

## 2014-09-15 MED ORDER — CEFAZOLIN SODIUM 1-5 GM-% IV SOLN
1.0000 g | Freq: Three times a day (TID) | INTRAVENOUS | Status: DC
  Administered 2014-09-15 – 2014-09-17 (×6): 1 g via INTRAVENOUS
  Filled 2014-09-15 (×7): qty 50

## 2014-09-15 MED ORDER — DEXTROSE 5 % IV SOLN
2.0000 g | INTRAVENOUS | Status: DC
  Administered 2014-09-15: 2 g via INTRAVENOUS
  Filled 2014-09-15 (×2): qty 2

## 2014-09-15 MED ORDER — OXYCODONE HCL 5 MG PO TABS: 5.0000 mg | ORAL_TABLET | ORAL | Status: DC | PRN

## 2014-09-15 MED ORDER — ONDANSETRON HCL 4 MG/2ML IJ SOLN
4.0000 mg | Freq: Four times a day (QID) | INTRAMUSCULAR | Status: DC | PRN
  Filled 2014-09-15: qty 2

## 2014-09-15 MED ORDER — ACETAMINOPHEN 325 MG PO TABS: 650.0000 mg | ORAL_TABLET | Freq: Four times a day (QID) | ORAL | Status: DC | PRN

## 2014-09-15 MED ORDER — ONDANSETRON HCL 4 MG PO TABS: 4.0000 mg | ORAL_TABLET | Freq: Four times a day (QID) | ORAL | Status: DC | PRN

## 2014-09-15 MED ORDER — IPRATROPIUM BROMIDE 0.02 % IN SOLN
0.5000 mg | RESPIRATORY_TRACT | Status: DC | PRN
Start: 2014-09-15 — End: 2014-09-20

## 2014-09-15 MED ORDER — VANCOMYCIN HCL 10 G IV SOLR: 1250.0000 mg | INTRAVENOUS | Status: DC

## 2014-09-15 NOTE — Evaluation (Signed)
Occupational Therapy Evaluation Patient Details Name: Tamara Stephenson MRN: [id number] DOB: 19-Sep-1933 Today's Date: 09/15/2014    History of Present Illness With history of diabetes mellitus, hypertension, carotid stenosis, status post lumbar laminectomy for epidural abscess 10/16/2013 which patient states ever since has caused her to be wheelchair-bound and in rehabilitation, chronic back pain who presents to the ED with several hour history of right upper extremity swelling, erythema, warmth, right elbow tenderness to palpation.. Patient has a history of R LE wekness and laminectomy in 8/15.   Clinical Impression   Pt admitted with RUE edema. Pt currently with functional limitations due to the deficits listed below (see OT Problem List).  Pt will benefit from skilled OT to increase their safety and independence with ADL and functional mobility for ADL to facilitate discharge to venue listed below.      Follow Up Recommendations  SNF    Equipment Recommendations  None recommended by OT       Precautions / Restrictions Precautions Precautions: Fall      Mobility Bed Mobility Overal bed mobility: Needs Assistance Bed Mobility: Supine to Sit;Sit to Supine     Supine to sit: Min assist;HOB elevated Sit to supine: Mod assist   General bed mobility comments: extra time to mobilize, easily distracted in conversation!   Transfers Overall transfer level: Needs assistance Equipment used: Rolling walker (2 wheeled) Transfers: Sit to/from [facility] Sit to Stand: Mod assist;From elevated surface Stand pivot transfers: Mod assist       General transfer comment: extra time to rise from  surfaces, cues for safety, tends to push back against the [facility] which will slide. multimodal cues to stay on task, noted R kne hyperextension  with weightbearing. pivot bed to recliner and back to bed. Stood at [employer] for [employer]. Grossly holds onto RW with R hand,     Balance Overall  balance assessment: History of Falls;Needs assistance Sitting-balance support: Feet supported;No upper extremity supported Sitting balance-Leahy Scale: Fair     Standing balance support: During functional activity;Bilateral upper extremity supported Standing balance-Leahy Scale: Poor                              ADL Overall ADL's : Needs assistance/impaired     Grooming: Minimal assistance;Sitting   Upper Body Bathing: Maximal assistance;Sitting   Lower Body Bathing: Maximal assistance;Sitting/lateral leans   Upper Body Dressing : Maximal assistance;Sitting   Lower Body Dressing: Sitting/lateral leans;Maximal assistance                 General ADL Comments: pt is R handed so ADL's very impaired!               Pertinent Vitals/Pain Pain Assessment: 0-10 Pain Score: 5  Pain Location: RUE Pain Descriptors / Indicators: Sore;Discomfort Pain Intervention(s): Limited activity within patient's tolerance     Hand Dominance Right   Extremity/Trunk Assessment Upper Extremity Assessment Upper Extremity Assessment: Generalized weakness RUE Deficits / Details: decreased shoulder elevation, decreased elbow flexion and hand flexion due to edema and pain   Lower Extremity Assessment Lower Extremity Assessment: RLE deficits/detail RLE Deficits / Details: trace dorsiflexion, knee extension 4, knee flexion 3/5, noted hyper extension of knee with weight bearing, steppage       Communication Communication Communication: No difficulties   Cognition Arousal/Alertness: Awake/alert Behavior During Therapy: WFL for tasks assessed/performed Overall Cognitive Status: Within Functional Limits for tasks assessed  Shoulder Instructions      Home Living Family/patient expects to be discharged to:: Private residence Living Arrangements: Alone   Type of Home: Apartment Home Access: Level entry     Home Layout: One level                Home Equipment: [place] - 2 wheels;Other (comment);Bedside commode;Wheelchair - manual          Prior Functioning/Environment Level of Independence: Independent with assistive device(s)        Comments: uses RW      OT Diagnosis: Generalized weakness;Acute pain   OT Problem List: Decreased strength;Decreased activity tolerance;Pain   OT Treatment/Interventions: Self-care/ADL training;DME and/or AE instruction;Patient/family education;Therapeutic exercise    OT Goals(Current goals can be found in the care plan section) Acute Rehab OT Goals Patient Stated Goal: to go home and see my cats. OT Goal Formulation: With patient Time For Goal Achievement: 09/29/14 ADL Goals Pt Will Perform Grooming: with supervision;standing Pt Will Transfer to Toilet: with min assist;ambulating Pt Will Perform Toileting - Clothing Manipulation and hygiene: sit to/from stand;with min guard assist Pt/caregiver will Perform Home Exercise Program: Right Upper extremity;With written HEP provided;With minimal assist  OT Frequency: Min 3X/week    End of Session Nurse Communication: Mobility status  Activity Tolerance: Patient tolerated treatment well Patient left: in bed;with family/visitor present;with bed alarm set   Time: 1340-1413 OT Time Calculation (min): 33 min Charges:  OT General Charges $OT Visit: 1 Procedure OT Evaluation $Initial OT Evaluation Tier I: 1 Procedure OT Treatments $Self Care/Home Management : 8-22 mins G-Codes:    Payton Mccallum D 09/25/14, 2:30 PM

## 2014-09-15 NOTE — Evaluation (Signed)
Physical Therapy Evaluation Patient Details Name: Tamara Stephenson MRN: [id number] DOB: 10-Apr-1933 Today's Date: 09/15/2014   History of Present Illness  With history of diabetes mellitus, hypertension, carotid stenosis, status post lumbar laminectomy for epidural abscess 10/16/2013 which patient states ever since has caused her to be wheelchair-bound and in rehabilitation, chronic back pain who presents to the ED with several hour history of right upper extremity swelling, erythema, warmth, right elbow tenderness to palpation.. Patient has a history of R LE wekness and laminectomy in 8/15.  Clinical Impression  Patient requires extra time to mobilize,  Has history of R LE weakness, uses a WC and RW PTA. Patient will benefit from acute care PT to address problems listed in note below. Recommend /snf, patient was hesitant to agree. Recommend OT .    Follow Up Recommendations SNF;Supervision/Assistance - 24 hour    Equipment Recommendations  None recommended by PT    Recommendations for Other Services OT consult     Precautions / Restrictions Precautions Precautions: Fall      Mobility  Bed Mobility Overal bed mobility: Needs Assistance Bed Mobility: Supine to Sit;Sit to Supine     Supine to sit: Min assist;HOB elevated Sit to supine: Mod assist   General bed mobility comments: extra time to mobilize, easily distracted in conversation  Transfers Overall transfer level: Needs assistance Equipment used: Rolling walker (2 wheeled) Transfers: Sit to/from [facility] Sit to Stand: Mod assist;From elevated surface Stand pivot transfers: Mod assist       General transfer comment: extra time to rise from  surfaces, cues for safety, tends to push back against the [facility] which will slide. multimodal cues to stay on task, noted R kne hyperextension  with weightbearing. pivot bed to recliner and back to bed. Stood at [employer] for [employer]. Grossly holds onto RW with R hand,    Ambulation/Gait                Stairs            Wheelchair Mobility    Modified Rankin (Stroke Patients Only)       Balance Overall balance assessment: History of Falls;Needs assistance Sitting-balance support: Feet supported;No upper extremity supported Sitting balance-Leahy Scale: Fair     Standing balance support: During functional activity;Bilateral upper extremity supported Standing balance-Leahy Scale: Poor                               Pertinent Vitals/Pain Pain Assessment: 0-10 Pain Score: 6  Pain Location: R UE Pain Descriptors / Indicators: Aching;Guarding;Grimacing;Shooting;Sore Pain Intervention(s): Limited activity within patient's tolerance;Monitored during session    [place] expects to be discharged to:: Private residence Living Arrangements: Alone   Type of Home: Apartment Home Access: Level entry     Home Layout: [place]: [place] - 2 wheels;Other (comment);Bedside commode;Wheelchair - manual      Prior Function Level of Independence: Independent with assistive device(s)         Comments: uses RW       Hand Dominance   Dominant Hand: Right    Extremity/Trunk Assessment   Upper Extremity Assessment: Generalized weakness;RUE deficits/detail RUE Deficits / Details: decreased shoulder elevation, decreased elbow flexion and hand flexion, able to support onto RW grossly         Lower Extremity Assessment: RLE deficits/detail RLE Deficits / Details: trace dorsiflexion, knee extension 4, knee flexion 3/5, noted hyper extension of  knee with weight bearing, steppage       Communication   Communication: No difficulties  Cognition Arousal/Alertness: Awake/alert Behavior During Therapy: WFL for tasks assessed/performed Overall Cognitive Status: Within Functional Limits for tasks assessed                      General Comments      Exercises         Assessment/Plan    PT Assessment Patient needs continued PT services  PT Diagnosis Difficulty walking;Generalized weakness;Acute pain   PT Problem List Decreased strength;Decreased range of motion;Decreased activity tolerance;Decreased balance;Decreased mobility;Decreased knowledge of precautions;Decreased safety awareness;Decreased knowledge of use of DME;Decreased skin integrity;Pain  PT Treatment Interventions DME instruction;Gait training;Functional mobility training;Therapeutic activities;Therapeutic exercise;Balance training;Patient/family education;Wheelchair mobility training   PT Goals (Current goals can be found in the Care Plan section) Acute Rehab PT Goals Patient Stated Goal: to go home and see my cats. PT Goal Formulation: With patient Time For Goal Achievement: 09/29/14 Potential to Achieve Goals: Good    Frequency Min 3X/week   Barriers to discharge Decreased caregiver support      Co-evaluation               End of Session   Activity Tolerance: Patient tolerated treatment well;Patient limited by pain Patient left: in bed;with call bell/phone within reach Nurse Communication: Mobility status         Time: 1131-1217 PT Time Calculation (min) (ACUTE ONLY): 46 min   Charges:   PT Evaluation $Initial PT Evaluation Tier I: 1 Procedure PT Treatments $Therapeutic Activity: 23-37 mins   PT G Codes:        Claretha Cooper 09/15/2014, 1:10 PM Tresa Endo PT [phone]

## 2014-09-15 NOTE — Progress Notes (Signed)
Inpatient Diabetes Program Recommendations  AACE/ADA: New Consensus Statement on Inpatient Glycemic Control (2013)  Target Ranges:  Prepandial:   less than 140 mg/dL      Peak postprandial:   less than 180 mg/dL (1-2 hours)      Critically ill patients:  140 - 180 mg/dL   Reason for Visit: Diabetes Consult  Diabetes history: DM2 Outpatient Diabetes medications: Levemir 12 units QHS, Novolog 3 units tidwc Current orders for Inpatient glycemic control: Levemir 12 units QHS, Novolog resistant tidwc  Results for ELENORA, HAWBAKER (MRN [id number]) as of 09/15/2014 16:02  Ref. Range 09/15/2014 08:08 09/15/2014 12:06  Glucose-Capillary Latest Ref Range: 65-99 mg/dL 97 147 (H)  Results for NORINE, REDDINGTON (MRN [id number]) as of 09/15/2014 16:02  Ref. Range 09/15/2014 03:10  Sodium Latest Ref Range: 135-145 mmol/L 135  Potassium Latest Ref Range: 3.5-5.1 mmol/L 3.3 (L)  Chloride Latest Ref Range: 101-111 mmol/L 107  CO2 Latest Ref Range: 22-32 mmol/L 21 (L)  BUN Latest Ref Range: 6-20 mg/dL 15  Creatinine Latest Ref Range: 0.44-1.00 mg/dL 1.14 (H)  Calcium Latest Ref Range: 8.9-10.3 mg/dL 8.2 (L)  EGFR (Non-African Amer.) Latest Ref Range: >60 mL/min 45 (L)  EGFR (African American) Latest Ref Range: >60 mL/min 52 (L)  Glucose Latest Ref Range: 65-99 mg/dL 207 (H)  Anion gap Latest Ref Range: 63-6  52   81 year old female admitted with cellulitis. Hx IDDM, checks blood sugars several times/day. States they are excellent in the am, but high throughout the day up until bedtime.  Has not been to MD in several months. HgbA1C pending.  Needs meal coverage insulin.  Recommendations: Add Novolog 3 units tidwc for meal coverage insulin - may need higher dose of meal coverage insulin for home F/U with PCP after discharge and take blood sugar log for any needed adjustments to her insulin.    Note: Discussed importance of tighter glycemic control to prevent long-term complications. Pt voiced understanding. Will  f/u in am. Thank you. Lorenda Peck, RD, LDN, CDE Inpatient Diabetes Coordinator [phone]

## 2014-09-15 NOTE — H&P (Signed)
[facility] History and Physical  Jakerra Floyd SNK:[id number] DOB: 08/14/1933 DOA: 09/15/2014  Referring physician: Dr Dan Europe PCP: Curly Rim, MD   Chief Complaint: Right upper extremity swelling  HPI: Tamara Stephenson is a 81 y.o. female  With history of diabetes mellitus, hypertension, carotid stenosis, status post lumbar laminectomy for epidural abscess 10/16/2013 which patient states ever since has caused her to be wheelchair-bound and in rehabilitation, chronic back pain who presents to the ED with several hour history of right upper extremity swelling, erythema, warmth, right elbow tenderness to palpation. Patient states 4 days prior to admission fell she had a change in her behavior which was very tearful. 3 days prior to admission patient stated that she felt very fatigued. 2 days prior to admission patient stated that she took her temperature was 102.5 and she felt a little bit woozy she stayed in bed took for baby aspirin's and slept. Patient stated that one day prior to admission around 7:30 AM on Sunday she felt well all day temperature was 99.2 sat up in bed with her legs up. Patient stated that late in the afternoon on Sunday one day prior to admission she noted she had right forearm and hand swelling with erythema warmth and tenderness to palpation. Patient also had some elbow pain. Patient did endorse some chills, an episode of emesis 4 days prior to admission none since. Patient denies any chest pain, no shortness of breath, no abdominal pain, no nausea, no cough, no melanoma, no hematemesis, no hematochezia, no diarrhea however did have some soft stool, no constipation, no weakness, no dysuria. Patient was seen in the emergency room noted to have significant right upper extremity cellulitis. Plain films of the elbow were ordered which were pending at the time. Sedimentation rate was obtained which was elevated at 39. Lactic acid was 1.20. Comprehensive metabolic profile had a  potassium of 3.3 bicarbonate of 21 creatinine of 1.14 glucose of 207 alk phosphatase of 212 abdomen of 2.5 bilirubin of 1.4 otherwise was within normal limits. CBC had a hemoglobin of 11.6 otherwise was within normal limits. Urinalysis was nitrite negative leukocytes negative. Chest x-ray which was done was negative for any acute infiltrate. Patient was given a dose of IV vancomycin. [facility] were consulted to admit the patient for further evaluation and management.   Review of Systems: As per history of present illness otherwise negative. Constitutional:  No weight loss, night sweats, Fevers, chills, fatigue.  HEENT:  No headaches, Difficulty swallowing,Tooth/dental problems,Sore throat,  No sneezing, itching, ear ache, nasal congestion, post nasal drip,  Cardio-vascular:  No chest pain, Orthopnea, PND, swelling in lower extremities, anasarca, dizziness, palpitations  GI:  No heartburn, indigestion, abdominal pain, nausea, vomiting, diarrhea, change in bowel habits, loss of appetite  Resp:  No shortness of breath with exertion or at rest. No excess mucus, no productive cough, No non-productive cough, No coughing up of blood.No change in color of mucus.No wheezing.No chest wall deformity  Skin:  no rash or lesions.  GU:  no dysuria, change in color of urine, no urgency or frequency. No flank pain.  Musculoskeletal:  No joint pain or swelling. No decreased range of motion. No back pain.  Psych:  No change in mood or affect. No depression or anxiety. No memory loss.   Past Medical History  Diagnosis Date  . Back pain   . Hypertension   . Carotid stenosis   . Colitis   . Diabetes mellitus without complication    Past Surgical  History  Procedure Laterality Date  . Carotid endarterectomy    . Lumbar laminectomy for epidural abscess N/A 10/16/2013    Procedure: LUMBAR LAMINECTOMY FOR EPIDURAL ABSCESS Lumbar Two through Four;  Surgeon: Charlie Pitter, MD;  [facility];   Service: Neurosurgery;  Laterality: N/A;   Social History:  reports that she has been smoking.  She does not have any smokeless tobacco history on file. She reports that she does not drink alcohol. Her drug history is not on file.  Allergies  Allergen Reactions  . Peanut-Containing Drug Products Nausea And Vomiting    Family History  Problem Relation Age of Onset  . Leukemia Mother   . Jaundice Father      Prior to Admission medications   Medication Sig Start Date End Date Taking? Authorizing Provider  buPROPion (WELLBUTRIN XL) 300 MG 24 hr tablet Take 300 mg by mouth daily.   Yes Historical Provider, MD  Cholecalciferol (VITAMIN D) 2000 UNITS tablet Take 2,000 Units by mouth daily.   Yes Historical Provider, MD  ferrous sulfate 325 (65 FE) MG tablet Take 325 mg by mouth daily with breakfast.   Yes Historical Provider, MD  gabapentin (NEURONTIN) 300 MG capsule Take 1 capsule (300 mg total) by mouth at bedtime. 10/21/13  Yes Marianne L York, PA-C  insulin aspart (NOVOLOG) 100 UNIT/ML injection Inject 3 Units into the skin 3 (three) times daily with meals. 10/21/13  Yes Marianne L York, PA-C  insulin detemir (LEVEMIR) 100 UNIT/ML injection Inject 0.12 mLs (12 Units total) into the skin at bedtime. 10/21/13  Yes Marianne L York, PA-C  lactulose (CHRONULAC) 10 GM/15ML solution Take 30 mLs (20 g total) by mouth 2 (two) times daily. 10/21/13  Yes Marianne L York, PA-C  lovastatin (MEVACOR) 20 MG tablet Take 20 mg by mouth at bedtime.   Yes Historical Provider, MD  oxyCODONE (OXY IR/ROXICODONE) 5 MG immediate release tablet Take 1 tablet (5 mg total) by mouth every 4 (four) hours as needed for severe pain. 10/21/13  Yes Marianne L York, PA-C  ranitidine (ZANTAC) 300 MG tablet Take 300 mg by mouth at bedtime.   Yes Historical Provider, MD  ursodiol (ACTIGALL) 300 MG capsule Take 300 mg by mouth 3 (three) times daily.   Yes Historical Provider, MD  insulin aspart (NOVOLOG) 100 UNIT/ML injection Inject  0-15 Units into the skin 3 (three) times daily with meals. Patient not taking: Reported on 09/15/2014 10/21/13   Bobby Rumpf York, PA-C  polyethylene glycol (MIRALAX / GLYCOLAX) packet Take 17 g by mouth daily. Patient taking differently: Take 17 g by mouth daily as needed for mild constipation.  10/21/13   Melton Alar, PA-C   Physical Exam: Filed Vitals:   09/15/14 0240 09/15/14 0253 09/15/14 0416 09/15/14 0524  BP: 177/98  162/66 159/73  Pulse: 73  64 69  Temp: 98.5 F (36.9 C) 100.2 F (37.9 C)    TempSrc: Oral Rectal    Resp: 20  18 15   Height:    5' 6"  (1.676 m)  Weight:    92.987 kg (205 lb)  SpO2: 98%  93% 94%    Wt Readings from Last 3 Encounters:  09/15/14 92.987 kg (205 lb)  07/22/14 83.604 kg (184 lb 5 oz)  10/21/13 101.4 kg (223 lb 8.7 oz)    General:  Elderly female very talkative, laying on gurney in no acute cardiopulmonary distress. Speaking in full sentences.  Eyes: PERRLA, EOMI, normal lids, irises & conjunctiva ENT:  grossly normal hearing, lips & tongue Neck: no LAD, masses or thyromegaly Cardiovascular: RRR, no m/r/g. 2+ bilateral lower extremity edema which patient states is chronic. Telemetry: SR, no arrhythmias  Respiratory: CTA bilaterally, no w/r/r. Normal respiratory effort. Abdomen: soft, ntnd, positive bowel sounds, no rebound, no guarding Skin: Right upper extremity with erythema from hands down on forearm and 2 elbow, some warmth, tenderness to palpation. Some areas of ecchymosis. Musculoskeletal: grossly normal tone BUE/BLE Psychiatric: grossly normal mood and affect, speech fluent and appropriate Neurologic: Alert and oriented 3. CN 2-12 grossly intact. No focal deficits. grossly non-focal.          Labs on Admission:  Basic Metabolic Panel:  Recent Labs Lab 09/15/14 0310  NA 135  K 3.3*  CL 107  CO2 21*  GLUCOSE 207*  BUN 15  CREATININE 1.14*  CALCIUM 8.2*   Liver Function Tests:  Recent Labs Lab 09/15/14 0310  AST 35    ALT 18  ALKPHOS 212*  BILITOT 1.4*  PROT 7.4  ALBUMIN 2.5*   No results for input(s): LIPASE, AMYLASE in the last 168 hours. No results for input(s): AMMONIA in the last 168 hours. CBC:  Recent Labs Lab 09/15/14 0310  WBC 8.9  NEUTROABS 5.5  HGB 11.6*  HCT 35.1*  MCV 102.3*  PLT 204   Cardiac Enzymes: No results for input(s): CKTOTAL, CKMB, CKMBINDEX, TROPONINI in the last 168 hours.  BNP (last 3 results) No results for input(s): BNP in the last 8760 hours.  ProBNP (last 3 results)  Recent Labs  10/12/13 1251  PROBNP 1099.0*    CBG: No results for input(s): GLUCAP in the last 168 hours.  Radiological Exams on Admission: Dg Chest Port 1 View  09/15/2014   CLINICAL DATA:  Fever. Purple spot on the right forearm. Right forearm pain.  EXAM: PORTABLE CHEST - 1 VIEW  COMPARISON:  10/17/2013  FINDINGS: Borderline heart size and pulmonary vascularity are probably normal for technique. No focal airspace disease or consolidation. No blunting of costophrenic angles. Linear scarring in the left costophrenic angle. Calcified and tortuous aorta. Degenerative changes in the shoulders and spine.  IMPRESSION: No evidence of active pulmonary disease. Scarring in the left lung base.   Electronically Signed   By: Lucienne Capers M.D.   On: 09/15/2014 03:09     EKG: None  Assessment/Plan Principal Problem:   Cellulitis of right upper extremity Active Problems:   Diabetes mellitus   Hypertension   Fatty liver disease, nonalcoholic   S/P lumbar laminectomy   Anemia   Hypokalemia  #1 right upper extremity cellulitis Questionable etiology. Patient denies any bites. Patient denies any scratches from her cats. Right upper extremity x-rays pending. Blood cultures have been ordered and are pending. Due to patient's diabetes and extensive nature and acute worsening of her right upper extremity cellulitis will admit to MedSurg and placed empirically on IV vancomycin and IV Rocephin.  Supportive care. Keep right upper extremity elevated.  #2 hypokalemia Replete.  #3 diabetes mellitus Continue home dose long-acting insulin. Place on a sliding scale insulin. Check a hemoglobin A1c. Consult with diabetic coordinator.  #4 hypertension Stable.  #5 anemia Stable. No overt bleeding. Follow H&H.  #6 status post lumbar laminectomy with lower extremity weakness Patient states has had some lower extremity weakness since her surgery. PT/OT.  #7 prophylaxis PPI for GI prophylaxis. Lovenox for DVT prophylaxis.   Code Status: DO NOT RESUSCITATE DVT Prophylaxis: Lovenox Family Communication: Updated patient. No family at bedside. Disposition  Plan: Admit to MedSurg.  Time spent: 5 minutes  Memorie Yokoyama M.D. [facility] Pager [phone]

## 2014-09-15 NOTE — ED Notes (Signed)
Bed: TR71 Expected date: 09/15/14 Expected time: 1:56 AM Means of arrival: Ambulance Comments: 81 yo F  Arm pain and discoloration, fever

## 2014-09-15 NOTE — Progress Notes (Signed)
ANTIBIOTIC CONSULT NOTE - INITIAL  Pharmacy Consult for Vancomycin Indication: Cellulitis  Allergies  Allergen Reactions  . Peanut-Containing Drug Products Nausea And Vomiting    Patient Measurements: Height: 5\' 6"  (167.6 cm) Weight: 205 lb (92.987 kg) IBW/kg (Calculated) : 59.3 Adjusted Body Weight: 69 kg  Vital Signs: Temp: 100.2 F (37.9 C) (07/11 0253) Temp Source: Rectal (07/11 0253) BP: 159/73 mmHg (07/11 0524) Pulse Rate: 69 (07/11 0524) Intake/Output from previous day:   Intake/Output from this shift:    Labs:  Recent Labs  09/15/14 0310  WBC 8.9  HGB 11.6*  PLT 204  CREATININE 1.14*   Estimated Creatinine Clearance: 46.7 mL/min (by C-G formula based on Cr of 1.14). No results for input(s): VANCOTROUGH, VANCOPEAK, VANCORANDOM, GENTTROUGH, GENTPEAK, GENTRANDOM, TOBRATROUGH, TOBRAPEAK, TOBRARND, AMIKACINPEAK, AMIKACINTROU, AMIKACIN in the last 72 hours.   Microbiology: No results found for this or any previous visit (from the past 720 hour(s)).  Medical History: Past Medical History  Diagnosis Date  . Back pain   . Hypertension   . Carotid stenosis   . Colitis   . Diabetes mellitus without complication     Medications:  Anti-infectives    Start     Dose/Rate Route Frequency Ordered Stop   09/15/14 0645  cefTRIAXone (ROCEPHIN) 2 g in dextrose 5 % 50 mL IVPB     2 g 100 mL/hr over 30 Minutes Intravenous Every 24 hours 09/15/14 0631     09/15/14 0430  vancomycin (VANCOCIN) IVPB 1000 mg/200 mL premix     1,000 mg 200 mL/hr over 60 Minutes Intravenous  Once 09/15/14 0415 09/15/14 0623     Assessment: 80yo F w/ fever, right arm pain & swelling, and rash/purple spot. Given single-doses of Rocephin and Vanc in the ED. Pharmacy is asked to continue Vancomycin. SCr slightly elevated. CrCl 47CG.   Goal of Therapy:  Vancomycin trough level 10-15 mcg/ml  Appropriate antibiotic dosing for renal function; eradication of infection  Plan:  Vancomycin 500mg   IV x 1 now, then 1250mg  IV q24h. Measure Vanc trough at steady state. Follow up renal fxn, culture results, and clinical course.  Romeo Rabon, PharmD, pager [phone]. 09/15/2014,6:46 AM.

## 2014-09-15 NOTE — Consult Note (Addendum)
WOC wound consult note Reason for Consult:Bilateral edema with partial thickness wounds and erythema Wound type:CVI Pressure Ulcer POA: Yes Measurement:shallow partial thickness ulcer on the pretibial area the largest measures 1cm x 1.5cm x 0.1cm on the RLE Wound ZRA:QTMAU, pink, moist Drainage (amount, consistency, odor)  Periwound:Hemosiderin staining in the pretibial areas bialterally Dressing procedure/placement/frequency: The patient would benefit form compression therapy, but first we should obtain an ABI and if it is >0.9 bilaterally. Following that, we can apply Unna's Boots here and arrange for follow up either by an outpatient wound center or a HHRN. She indicates to me today that transportation is an issue for her.  In the meantime, I will suggest conservative care with topical care and mild compression and elevation. [facility] nursing team will not follow, but will remain available to this patient, the nursing and medical team.  Please re-consult if needed. Thanks, Maudie Flakes, MSN, RN, [place], [place], [place] [phone])

## 2014-09-15 NOTE — ED Notes (Signed)
Lab is running Hgb A1C

## 2014-09-15 NOTE — ED Notes (Signed)
Called unit and Nurse said she will call back.

## 2014-09-15 NOTE — Clinical Social Work Note (Addendum)
Clinical Social Work Assessment  Patient Details  Name: Tamara Stephenson MRN: [id number] Date of Birth: 24-Feb-1934  Date of referral:  09/15/14               Reason for consult:  Discharge Planning                Permission sought to share information with:    Permission granted to share information::  No, Yes, Verbal Permission Granted  Name::        Agency::  [facility]  Relationship::     Contact Information:     Housing/Transportation Living arrangements for the past 2 months:  [place] of Information:  Patient Patient Interpreter Needed:  None Criminal Activity/Legal Involvement Pertinent to Current Situation/Hospitalization:  No - Comment as needed Significant Relationships:  None Lives with:  Self Do you feel safe going back to the place where you live?  Yes Need for family participation in patient care:  No (Coment)  Care giving concerns:  Patient prefers to return to apartment at [place] but understands that SNF might needed.   Social Worker assessment / plan:  CSW received referral due to patient being admitted from a facility. CSW met with patient who confirms she lives at [facility] in their independent living facility. Patient is happy where she lives and is worried about someone caring for her cats. CSW assisted patient in calling [facility] and was told that Ms. Lennox Grumbles would check on her animals. Patient has been to rehab at [facility] in the past and would prefer to return to her apartment so she can care for her cats. Patient aware that she might require more assistance but reports she is going to work hard on regaining her strength while she is in the hospital so that she can return to her apartment.  CSW explained CSW role and that CSW would keep [facility] updated on patient's progress. CSW completed FL2 and sent information to [facility].  Employment status:  Retired Nurse, adult PT Recommendations:   [place] / Referral to community resources:  [place]  Patient/Family's Response to care:  Patient engaged during assessment with good sense of humor. Patient reports she is resourceful and often finds people in the community to help since she is not married. Patient reports that she is looking for a husband so that she can have some additional help around her apartment.  Patient/Family's Understanding of and Emotional Response to Diagnosis, Current Treatment, and Prognosis:  Patient reports she is upset that her arm looks so terrible. Patient reports as soon she noticed that something was wrong she came to the hospital. Patient knows that she might require more assistance at DC but if arm has healed she believes she can use walker at home.  Emotional Assessment Appearance:  Appears stated age Attitude/Demeanor/Rapport:  Other (Engaged) Affect (typically observed):  Appropriate Orientation:  Oriented to Self, Oriented to Place, Oriented to  Time, Oriented to Situation Alcohol / Substance use:  Not Applicable Psych involvement (Current and /or in the community):  No (Comment)  Discharge Needs  Concerns to be addressed:  No discharge needs identified Readmission within the last 30 days:  No Current discharge risk:  None Barriers to Discharge:  Continued Medical Work up   [employer], [place] 09/15/2014, 1:58 PM [phone]

## 2014-09-15 NOTE — ED Provider Notes (Signed)
CSN: [id number]     Arrival date & time 09/15/14  0234 History   First MD Initiated Contact with Patient 09/15/14 0240     Chief Complaint  Patient presents with  . Fever    Patient has a fever with a purple spot right forearm.  . Rash    Patient has a fever with a purple spot right forearm.     (Consider location/radiation/quality/duration/timing/severity/associated sxs/prior Treatment) HPI Comments: Pt with hx of IDDM comes in with cc of right arm pain and swelling. Pt reports that she noticed redness to the forearm yday afternoon, and the redness spread over the course of few hours to the finger and the elbow - so she decided to come to the ER . Pt has no nausea, emesis. She started having fevers yday and was febrile at the arrival here. She reports getting bit 2 weeks ago - possibly. No trauma.    ROS 10 Systems reviewed and are negative for acute change except as noted in the HPI.     The history is provided by the patient and medical records.    Past Medical History  Diagnosis Date  . Back pain   . Hypertension   . Carotid stenosis   . Colitis   . Diabetes mellitus without complication    Past Surgical History  Procedure Laterality Date  . Carotid endarterectomy    . Lumbar laminectomy for epidural abscess N/A 10/16/2013    Procedure: LUMBAR LAMINECTOMY FOR EPIDURAL ABSCESS Lumbar Two through Four;  Surgeon: Charlie Pitter, MD;  Location: [facility];  Service: Neurosurgery;  Laterality: N/A;   Family History  Problem Relation Age of Onset  . Leukemia Mother   . Jaundice Father    History  Substance Use Topics  . Smoking status: Smoker, Current Status Unknown  . Smokeless tobacco: Not on file  . Alcohol Use: No   OB History    No data available     Review of Systems  Skin: Positive for rash and wound.  All other systems reviewed and are negative.     Allergies  Peanut-containing drug products  Home Medications   Prior to Admission medications     Medication Sig Start Date End Date Taking? Authorizing Provider  buPROPion (WELLBUTRIN XL) 300 MG 24 hr tablet Take 300 mg by mouth daily.   Yes Historical Provider, MD  Cholecalciferol (VITAMIN D) 2000 UNITS tablet Take 2,000 Units by mouth daily.   Yes Historical Provider, MD  ferrous sulfate 325 (65 FE) MG tablet Take 325 mg by mouth daily with breakfast.   Yes Historical Provider, MD  gabapentin (NEURONTIN) 300 MG capsule Take 1 capsule (300 mg total) by mouth at bedtime. 10/21/13  Yes Marianne L York, PA-C  insulin aspart (NOVOLOG) 100 UNIT/ML injection Inject 3 Units into the skin 3 (three) times daily with meals. 10/21/13  Yes Marianne L York, PA-C  insulin detemir (LEVEMIR) 100 UNIT/ML injection Inject 0.12 mLs (12 Units total) into the skin at bedtime. 10/21/13  Yes Marianne L York, PA-C  lactulose (CHRONULAC) 10 GM/15ML solution Take 30 mLs (20 g total) by mouth 2 (two) times daily. 10/21/13  Yes Marianne L York, PA-C  lovastatin (MEVACOR) 20 MG tablet Take 20 mg by mouth at bedtime.   Yes Historical Provider, MD  oxyCODONE (OXY IR/ROXICODONE) 5 MG immediate release tablet Take 1 tablet (5 mg total) by mouth every 4 (four) hours as needed for severe pain. 10/21/13  Yes Melton Alar, PA-C  ranitidine (ZANTAC) 300 MG tablet Take 300 mg by mouth at bedtime.   Yes Historical Provider, MD  ursodiol (ACTIGALL) 300 MG capsule Take 300 mg by mouth 3 (three) times daily.   Yes Historical Provider, MD  insulin aspart (NOVOLOG) 100 UNIT/ML injection Inject 0-15 Units into the skin 3 (three) times daily with meals. Patient not taking: Reported on 09/15/2014 10/21/13   Bobby Rumpf York, PA-C  polyethylene glycol (MIRALAX / GLYCOLAX) packet Take 17 g by mouth daily. Patient taking differently: Take 17 g by mouth daily as needed for mild constipation.  10/21/13   Marianne L York, PA-C   BP 159/73 mmHg  Pulse 69  Temp(Src) 100.2 F (37.9 C) (Rectal)  Resp 15  Ht 5\' 6"  (1.676 m)  Wt 205 lb (92.987 kg)   BMI 33.10 kg/m2  SpO2 94% Physical Exam  Constitutional: She is oriented to person, place, and time. She appears well-developed.  HENT:  Head: Normocephalic and atraumatic.  Eyes: EOM are normal.  Neck: Normal range of motion. Neck supple.  Cardiovascular: Normal rate.   Pulmonary/Chest: Effort normal.  Abdominal: Bowel sounds are normal. There is no tenderness.  Neurological: She is alert and oriented to person, place, and time.  Skin: Skin is warm and dry. Rash noted.  PLEASE SEE THE PICTURE BELOW: Erythematous, edematous, warm Rt forearm. Pt able to pronate and supinate. Pt has ankle tenderness to palpation. She is able to flex and extend the elbow.   Nursing note and vitals reviewed.             ED Course  Procedures (including critical care time) Labs Review Labs Reviewed  CBC WITH DIFFERENTIAL/PLATELET - Abnormal; Notable for the following:    RBC 3.43 (*)    Hemoglobin 11.6 (*)    HCT 35.1 (*)    MCV 102.3 (*)    Monocytes Relative 13 (*)    Monocytes Absolute 1.2 (*)    All other components within normal limits  COMPREHENSIVE METABOLIC PANEL - Abnormal; Notable for the following:    Potassium 3.3 (*)    CO2 21 (*)    Glucose, Bld 207 (*)    Creatinine, Ser 1.14 (*)    Calcium 8.2 (*)    Albumin 2.5 (*)    Alkaline Phosphatase 212 (*)    Total Bilirubin 1.4 (*)    GFR calc non Af Amer 45 (*)    GFR calc Af Amer 52 (*)    All other components within normal limits  URINALYSIS, ROUTINE W REFLEX MICROSCOPIC (NOT AT [facility]) - Abnormal; Notable for the following:    Glucose, UA 100 (*)    Urobilinogen, UA 4.0 (*)    All other components within normal limits  CULTURE, BLOOD (ROUTINE X 2)  CULTURE, BLOOD (ROUTINE X 2)  URINE CULTURE  C-REACTIVE PROTEIN  SEDIMENTATION RATE  I-STAT CG4 LACTIC ACID, ED    Imaging Review Dg Chest Port 1 View  09/15/2014   CLINICAL DATA:  Fever. Purple spot on the right forearm. Right forearm pain.  EXAM: PORTABLE CHEST -  1 VIEW  COMPARISON:  10/17/2013  FINDINGS: Borderline heart size and pulmonary vascularity are probably normal for technique. No focal airspace disease or consolidation. No blunting of costophrenic angles. Linear scarring in the left costophrenic angle. Calcified and tortuous aorta. Degenerative changes in the shoulders and spine.  IMPRESSION: No evidence of active pulmonary disease. Scarring in the left lung base.   Electronically Signed   By: Oren Beckmann.D.  On: 09/15/2014 03:09     EKG Interpretation None                 MDM   Final diagnoses:  Cellulitis of hand    Pt with fever. She is noted to have rash to the hand-  Which appears to be consistent with cellulitis. ROM of the elbow, wrist and the intrinsic/extrinsic muscles of the hand is fairly normal - and so we doubt deep infection. Xrays ordered to r/o free air.  With her being febrile, the infection spreading quickly - we will admit to medicine.    Varney Biles, MD 09/15/14 [phone]

## 2014-09-15 NOTE — Progress Notes (Signed)
Patient admitted after midnight Please refer to admission note completed 09/15/2014 Patient admitted with right upper extremity cellulitis but she also has bilateral lower extremity cellulitis  Assessment and plan: Cellulitis of bilateral lower extremities / right upper extremity cellulitis - Patient is on broad-spectrum antibodies, vancomycin and Rocephin - Obtain wound care consult - X-ray of the right forearm shows soft tissue infiltration or edema   Leisa Lenz [facility] [phone]

## 2014-09-15 NOTE — ED Notes (Signed)
Patient has a fever with a purple spot right forearm.

## 2014-09-16 ENCOUNTER — Inpatient Hospital Stay (HOSPITAL_COMMUNITY): Payer: Medicare HMO

## 2014-09-16 DIAGNOSIS — E785 Hyperlipidemia, unspecified: Secondary | ICD-10-CM | POA: Diagnosis present

## 2014-09-16 DIAGNOSIS — F32A Depression, unspecified: Secondary | ICD-10-CM | POA: Diagnosis present

## 2014-09-16 DIAGNOSIS — K760 Fatty (change of) liver, not elsewhere classified: Secondary | ICD-10-CM | POA: Diagnosis present

## 2014-09-16 DIAGNOSIS — N183 Chronic kidney disease, stage 3 unspecified: Secondary | ICD-10-CM | POA: Diagnosis present

## 2014-09-16 DIAGNOSIS — IMO0002 Reserved for concepts with insufficient information to code with codable children: Secondary | ICD-10-CM | POA: Diagnosis present

## 2014-09-16 DIAGNOSIS — E1129 Type 2 diabetes mellitus with other diabetic kidney complication: Secondary | ICD-10-CM | POA: Diagnosis present

## 2014-09-16 DIAGNOSIS — E1165 Type 2 diabetes mellitus with hyperglycemia: Secondary | ICD-10-CM

## 2014-09-16 DIAGNOSIS — E1121 Type 2 diabetes mellitus with diabetic nephropathy: Secondary | ICD-10-CM | POA: Diagnosis present

## 2014-09-16 DIAGNOSIS — F329 Major depressive disorder, single episode, unspecified: Secondary | ICD-10-CM | POA: Diagnosis present

## 2014-09-16 DIAGNOSIS — M7989 Other specified soft tissue disorders: Secondary | ICD-10-CM

## 2014-09-16 DIAGNOSIS — E669 Obesity, unspecified: Secondary | ICD-10-CM | POA: Diagnosis present

## 2014-09-16 DIAGNOSIS — E66811 Obesity, class 1: Secondary | ICD-10-CM | POA: Diagnosis present

## 2014-09-16 DIAGNOSIS — L8990 Pressure ulcer of unspecified site, unspecified stage: Secondary | ICD-10-CM

## 2014-09-16 DIAGNOSIS — L8992 Pressure ulcer of unspecified site, stage 2: Secondary | ICD-10-CM | POA: Diagnosis present

## 2014-09-16 DIAGNOSIS — D638 Anemia in other chronic diseases classified elsewhere: Secondary | ICD-10-CM | POA: Diagnosis present

## 2014-09-16 LAB — CBC
HCT: 31.2 % — ABNORMAL LOW (ref 36.0–46.0)
HEMOGLOBIN: 10.5 g/dL — AB (ref 12.0–15.0)
MCH: 34.4 pg — ABNORMAL HIGH (ref 26.0–34.0)
MCHC: 33.7 g/dL (ref 30.0–36.0)
MCV: 102.3 fL — ABNORMAL HIGH (ref 78.0–100.0)
Platelets: 160 10*3/uL (ref 150–400)
RBC: 3.05 MIL/uL — ABNORMAL LOW (ref 3.87–5.11)
RDW: 15.4 % (ref 11.5–15.5)
WBC: 8.7 10*3/uL (ref 4.0–10.5)

## 2014-09-16 LAB — BASIC METABOLIC PANEL
ANION GAP: 7 (ref 5–15)
BUN: 16 mg/dL (ref 6–20)
CALCIUM: 7.6 mg/dL — AB (ref 8.9–10.3)
CHLORIDE: 105 mmol/L (ref 101–111)
CO2: 23 mmol/L (ref 22–32)
CREATININE: 1.21 mg/dL — AB (ref 0.44–1.00)
GFR, EST AFRICAN AMERICAN: 48 mL/min — AB (ref >60)
GFR, EST NON AFRICAN AMERICAN: 42 mL/min — AB (ref >60)
Glucose, Bld: 56 mg/dL — ABNORMAL LOW (ref 65–99)
Potassium: 3.3 mmol/L — ABNORMAL LOW (ref 3.5–5.1)
Sodium: 135 mmol/L (ref 135–145)

## 2014-09-16 LAB — GLUCOSE, CAPILLARY
GLUCOSE-CAPILLARY: 37 mg/dL — AB (ref 65–99)
GLUCOSE-CAPILLARY: 104 mg/dL — AB (ref 65–99)
GLUCOSE-CAPILLARY: 105 mg/dL — AB (ref 65–99)
GLUCOSE-CAPILLARY: 43 mg/dL — AB (ref 65–99)
Glucose-Capillary: 51 mg/dL — ABNORMAL LOW (ref 65–99)
Glucose-Capillary: 74 mg/dL (ref 65–99)
Glucose-Capillary: 52 mg/dL — ABNORMAL LOW (ref 65–99)
Glucose-Capillary: 79 mg/dL (ref 65–99)
Glucose-Capillary: 57 mg/dL — ABNORMAL LOW (ref 65–99)

## 2014-09-16 LAB — URINE CULTURE: Culture: NO GROWTH

## 2014-09-16 LAB — HEMOGLOBIN A1C
HEMOGLOBIN A1C: 7.6 % — AB (ref 4.8–5.6)
Mean Plasma Glucose: 171 mg/dL

## 2014-09-16 MED ORDER — POTASSIUM CHLORIDE CRYS ER 20 MEQ PO TBCR
40.0000 meq | EXTENDED_RELEASE_TABLET | Freq: Once | ORAL | Status: AC
  Administered 2014-09-16: 40 meq via ORAL
  Filled 2014-09-16: qty 2

## 2014-09-16 MED ORDER — INSULIN ASPART 100 UNIT/ML ~~LOC~~ SOLN
3.0000 [IU] | Freq: Three times a day (TID) | SUBCUTANEOUS | Status: DC
  Administered 2014-09-16: 3 [IU] via SUBCUTANEOUS

## 2014-09-16 MED ORDER — DEXTROSE 5 % IV BOLUS
250.0000 mL | Freq: Once | INTRAVENOUS | Status: AC
  Administered 2014-09-17: 250 mL via INTRAVENOUS

## 2014-09-16 MED ORDER — INSULIN DETEMIR 100 UNIT/ML ~~LOC~~ SOLN: 5.0000 [IU] | Freq: Every day | SUBCUTANEOUS | Status: DC

## 2014-09-16 MED ORDER — INSULIN DETEMIR 100 UNIT/ML ~~LOC~~ SOLN
10.0000 [IU] | Freq: Every day | SUBCUTANEOUS | Status: DC
  Filled 2014-09-16: qty 0.1

## 2014-09-16 MED ORDER — INSULIN DETEMIR 100 UNIT/ML ~~LOC~~ SOLN
5.0000 [IU] | Freq: Every day | SUBCUTANEOUS | Status: DC
  Administered 2014-09-17 – 2014-09-19 (×2): 5 [IU] via SUBCUTANEOUS
  Filled 2014-09-16 (×4): qty 0.05

## 2014-09-16 MED ORDER — INSULIN DETEMIR 100 UNIT/ML ~~LOC~~ SOLN
5.0000 [IU] | Freq: Every day | SUBCUTANEOUS | Status: DC
  Filled 2014-09-16: qty 0.05

## 2014-09-16 NOTE — Progress Notes (Signed)
Floor coverage [place] notified of hypoglcemia event. Levimer order has been decreased will continue to monitor pt.

## 2014-09-16 NOTE — Progress Notes (Signed)
Hypoglycemic Event  CBG: 37  Treatment: orange juice given  Symptoms: sweating  Follow-up CBG: Time:0238 CBG Result:51  Possible Reasons for Event:   Comments/MD notified:    Anthoney Harada  Remember to initiate Hypoglycemia Order Set & complete

## 2014-09-16 NOTE — Care Management Note (Signed)
Case Management Note  Patient Details  Name: Dymphna Wadley MRN: [id number] Date of Birth: 1933-11-08  Subjective/Objective:     81 y/o f admitted w/R arm cellulitis. From indep liv. PT-SNF.               Action/Plan:d/c plan SNF.   Expected Discharge Date:   (unknown)               Expected Discharge Plan:  Home/Self Care  In-House Referral:  Clinical Social Work  Discharge planning Services  CM Consult  Post Acute Care Choice:    Choice offered to:     DME Arranged:    DME Agency:     HH Arranged:    HH Agency:     Status of Service:  In process, will continue to follow  Medicare Important Message Given:    Date Medicare IM Given:    Medicare IM give by:    Date Additional Medicare IM Given:    Additional Medicare Important Message give by:     If discussed at [place] of Stay Meetings, dates discussed:    Additional Comments:  Dessa Phi, RN 09/16/2014, 4:23 PM

## 2014-09-16 NOTE — Progress Notes (Signed)
Occupational Therapy Treatment Patient Details Name: Tamara Stephenson MRN: [id number] DOB: 11-22-1933 Today's Date: 09/16/2014    History of present illness With history of diabetes mellitus, hypertension, carotid stenosis, status post lumbar laminectomy for epidural abscess 10/16/2013 which patient states ever since has caused her to be wheelchair-bound and in rehabilitation, chronic back pain who presents to the ED with several hour history of right upper extremity swelling, erythema, warmth, right elbow tenderness to palpation.. Patient has a history of R LE wekness and laminectomy in 8/15.   OT comments  Tolerated tx well.  Educated in hourly finger ROM and positioning to help decrease edema.  Pt is able to move RUE more than yesterday, per her report.  Follow Up Recommendations  SNF    Equipment Recommendations  None recommended by OT    Recommendations for Other Services      Precautions / Restrictions Precautions Precautions: Fall Restrictions Weight Bearing Restrictions: No       Mobility Bed Mobility         Supine to sit: Min guard;HOB elevated Sit to supine: Mod assist   General bed mobility comments: extra time for EOB; assist with legs for back to bed  Transfers   Equipment used:  (hand held assist) Transfers: Sit to/from [facility] Sit to Stand: Min assist;From elevated surface Stand pivot transfers: Min assist       General transfer comment: assist to rise and steady    Balance     Sitting balance-Leahy Scale: Fair       Standing balance-Leahy Scale: Poor                     ADL                           Toilet Transfer: Minimal assistance;Stand-pivot;BSC   Toileting- Clothing Manipulation and Hygiene: Total assistance;Sit to/from stand         General ADL Comments: Performed SPT to BSC--assisted pt with large non-skid socks over feet only as legs are wrapped.  She was unable to perform toilet hygiene with  either UE.  Pt's RUE moving much better per pt.  She was unable to tolerate shoulder AAROM with therapist holding hand and back of upper arm (upper arm sore); she was instructed in selrf AAROM holding hand only.  She is able to get to approximately 80 degrees; normally she is WFLs.  able to move fingers, wrist, forearm and elbow actively.  Educated in [place] and encouraged RUE fisting and opening 10x's an hour for edema management.  Also repositioned on pillows and educated on need for "ramp" positioning      Vision                     Perception     Praxis      Cognition   Behavior During Therapy: WFL for tasks assessed/performed Overall Cognitive Status: Within Functional Limits for tasks assessed                       Extremity/Trunk Assessment               Exercises Other Exercises Other Exercises: shoulder flexion; AAROM; AROM distally.  Encouraged fisting hourly and use of RUE during functional activities as tolerated   Shoulder Instructions       General Comments      Pertinent Vitals/ Pain  Pain Assessment: 0-10 Pain Score: 2  Pain Location: RUE Pain Descriptors / Indicators: Sore Pain Intervention(s): Limited activity within patient's tolerance;Monitored during session;Repositioned (allowed pt to handle RUE herself)  Home Living                                          Prior Functioning/Environment              Frequency Min 3X/week     Progress Toward Goals  OT Goals(current goals can now be found in the care plan section)  Progress towards OT goals: Progressing toward goals     Plan      Co-evaluation                 End of Session     Activity Tolerance Patient tolerated treatment well   Patient Left in bed;with call bell/phone within reach;with bed alarm set   Nurse Communication          Time: 5726-2035 OT Time Calculation (min): 40 min  Charges: OT General Charges $OT Visit: 1  Procedure OT Treatments $Self Care/Home Management : 8-22 mins $Therapeutic Activity: 23-37 mins  Eustace Hur 09/16/2014, 11:36 AM  Lesle Chris, OTR/L [phone] 09/16/2014

## 2014-09-16 NOTE — Progress Notes (Signed)
VASCULAR LAB PRELIMINARY  ARTERIAL  ABI completed: Within normal limits.  Ankle pressures may be slightly inaccurate due to thick dressings around legs, however based on waveforms alone, there appears to be adequate arterial flow.    RIGHT    LEFT    PRESSURE WAVEFORM  PRESSURE WAVEFORM  BRACHIAL N/A due to cellulitis on arm  BRACHIAL 150 Tri  DP 147 Tri DP 149 Bi  AT   AT    PT 157 Bi PT 140 Bi  PER   PER    GREAT TOE  NA GREAT TOE  NA    RIGHT LEFT  ABI 1.05 0.99     Landry Mellow, RDMS, RVT  09/16/2014, 3:19 PM

## 2014-09-16 NOTE — Progress Notes (Signed)
Hypoglycemic Event  CBG: 51  Treatment: orange juice  Symptoms: sweating  Follow-up CBG: Time:0257 CBG Result:74  Possible Reasons for Event:   Comments/MD notified:    Anthoney Harada  Remember to initiate Hypoglycemia Order Set & complete

## 2014-09-16 NOTE — Progress Notes (Addendum)
Hypoglycemic Event  CBG: 43  Treatment: 15 GM carbohydrate snack  Symptoms: None  Follow-up CBG: Time:1711 CBG Result: 79  Possible Reasons for Event: Inadequate meal intake  Comments/MD notified:    Tamara Stephenson  Remember to initiate Hypoglycemia Order Set & complete

## 2014-09-16 NOTE — Progress Notes (Signed)
Patient ID: Tamara Stephenson, female   DOB: 1933/03/31, 81 y.o.   MRN: [id number] [facility] PROGRESS NOTE  Tamara Stephenson BDZ:[id number] DOB: [birth year] DOA: 09/15/2014 PCP: Curly Rim, MD  Brief narrative:    81 y.o. female with past medical history of NASH, diabetes, diabetic neuropathy, dyslipidemia who presented to [facility] with worsening redness, pain and swelling on right arm for past couple of days prior to this admission. Patient has lot of cats at home and reports possible cat scratches. Patient was admitted for treatment of cellulitis. She was also found to have bilateral lower extremity redness concerning for cellulitis. Wound care has seen the patient in consultation.   Anticipate discharge: 09/17/2014 if cellulitis improves.    Assessment/Plan:    Principal Problem: Cellulitis of right upper extremity / Cellulitis in bilateral lower extremities / Pressure ulcer with abrasion, blister, partial thickness skin loss involving epidermis and/or dermis - Patient still has quite a significant area of erythema in the right upper extremity. She says it is improving with current anti-biotic, cefazolin. - She also has lower extremity cellulitis bilaterally. - She was seen by wound care in consultation. Per wound care assessment, Bilateral edema with partial thickness wounds and erythema. Measurement: shallow partial thickness ulcer on the pretibial area the largest measures 1cm x 1.5cm x 0.1cm on the RLE. Dressing procedure/placement/frequency: Obtain ABI (study pending). Following that, pt can have [employer] and arrange for follow up either by an outpatient wound center or a HHRN.  Active Problems: Hypokalemia - Unclear etiology - Supplemented   CKD (chronic kidney disease) stage 3, GFR 30-59 ml/min - Baseline creatinine 1.4 about 11 months ago and on this admission 1.14, stable.   Anemia of chronic disease / Anemia, iron deficiency - Anemia likely secondary to  combination of iron deficiency anemia and anemia of chronic kidney disease - Hemoglobin is 10.5 - No current indications for transfusion    Dyslipidemia - Continue statin therapy   Diabetes mellitus with renal manifestations, uncontrolled - A1c on this admission 7.6. - Continue Levemir 10 units at bedtime and NovoLog 3 units 3 times daily - Continue sliding scale insulin   Diabetic nephropathy - Continue gabapentin 300 mg at bedtime   Fatty liver - Continue ursodiol   Obesity, Class I, BMI 30-34.9 - Body mass index is 34.31 kg/(m^2). - Nutrition consulted     Depression - Continue Wellbutrin    DVT Prophylaxis  - Lovenox subQ ordered    Code Status: DNR/DNI Family Communication:  plan of care discussed with the patient Disposition Plan: Home 09/17/2014   IV access:  Peripheral IV  Procedures and diagnostic studies:    Dg Elbow 2 Views Right 09/15/2014  Soft tissue edema.  No acute bony abnormalities.     Dg Forearm Right 09/15/2014   Soft tissue infiltration or edema. Degenerative changes in the wrist. No acute bony abnormalities.     Dg Chest Port 1 View 09/15/2014  No evidence of active pulmonary disease. Scarring in the left lung base.      Medical Consultants:  None   Other Consultants:  Wound care Diabetic coordinator Nutrition  IAnti-Infectives:   Vancomycin 09/15/2014 Cefazolin 09/15/2014 -->     Leisa Lenz, MD  [facility] Pager [phone]  Time spent in minutes: 25 minutes  If 7PM-7AM, please contact night-coverage [email or web address] Password [facility] 09/16/2014, 6:49 AM   LOS: 1 day    HPI/Subjective: No acute overnight events. Patient reports redness is better on right arm.  Objective:  Filed Vitals:   09/15/14 0824 09/15/14 1621 09/15/14 2238 09/16/14 0625  BP:  142/55 136/67 144/69  Pulse:  74 71 66  Temp:  100.2 F (37.9 C) 100 F (37.8 C) 97.4 F (36.3 C)  TempSrc:  Oral Oral Oral  Resp:  20 20 20   Height:      Weight:  96.389 kg (212 lb 8 oz)     SpO2:  97% 97% 99%    Intake/Output Summary (Last 24 hours) at 09/16/14 0649 Last data filed at 09/15/14 0653  Gross per 24 hour  Intake    200 ml  Output      0 ml  Net    200 ml    Exam:   General:  Pt is alert, follows commands appropriately, not in acute distress  Cardiovascular: Regular rate and rhythm, S1/S2, no murmurs  Respiratory: Clear to auscultation bilaterally, no wheezing, no crackles, no rhonchi  Abdomen: Soft, non tender, non distended, bowel sounds present  Extremities: right upper extremity redness looks little improved based on outline; bilateral lower extremities redness  Neuro: Grossly nonfocal  Data Reviewed: Basic Metabolic Panel:  Recent Labs Lab 09/15/14 0310  NA 135  K 3.3*  CL 107  CO2 21*  GLUCOSE 207*  BUN 15  CREATININE 1.14*  CALCIUM 8.2*  MG 1.8   Liver Function Tests:  Recent Labs Lab 09/15/14 0310  AST 35  ALT 18  ALKPHOS 212*  BILITOT 1.4*  PROT 7.4  ALBUMIN 2.5*   No results for input(s): LIPASE, AMYLASE in the last 168 hours. No results for input(s): AMMONIA in the last 168 hours. CBC:  Recent Labs Lab 09/15/14 0310  WBC 8.9  NEUTROABS 5.5  HGB 11.6*  HCT 35.1*  MCV 102.3*  PLT 204   Cardiac Enzymes: No results for input(s): CKTOTAL, CKMB, CKMBINDEX, TROPONINI in the last 168 hours. BNP: Invalid input(s): POCBNP CBG:  Recent Labs Lab 09/15/14 1654 09/15/14 2208 09/16/14 0218 09/16/14 0238 09/16/14 0257  GLUCAP 128* 85 37* 51* 74    No results found for this or any previous visit (from the past 240 hour(s)).   Scheduled Meds: . buPROPion  300 mg Oral Daily  .  ceFAZolin (ANCEF) IV  1 g Intravenous 3 times per day  . enoxaparin (LOVENOX) injection  40 mg Subcutaneous Q24H  . ferrous sulfate  325 mg Oral Q breakfast  . gabapentin  300 mg Oral QHS  . insulin aspart  0-20 Units Subcutaneous TID WC  . insulin detemir  10 Units Subcutaneous QHS  . lactulose  20 g  Oral BID  . pantoprazole  40 mg Oral Q0600  . pravastatin  20 mg Oral q1800  . ursodiol  300 mg Oral TID

## 2014-09-16 NOTE — Progress Notes (Signed)
Initial Nutrition Assessment  DOCUMENTATION CODES:  Obesity unspecified  INTERVENTION: - Continue Carb Modified diet - RD will continue to monitor for needs  NUTRITION DIAGNOSIS:  Increased nutrient needs related to wound healing as evidenced by estimated needs  GOAL:  Patient will meet greater than or equal to 90% of their needs  MONITOR:  PO intake, Weight trends, Labs, I & O's  REASON FOR ASSESSMENT:  Consult Diet education  ASSESSMENT: 81 y.o. female with history of diabetes mellitus, hypertension, carotid stenosis, status post lumbar laminectomy for epidural abscess 10/16/2013 which patient states ever since has caused her to be wheelchair-bound and in rehabilitation, chronic back pain who presents to the ED with several hour history of right upper extremity swelling, erythema, warmth, right elbow tenderness to palpation.  Pt seen for consult for diet education. Talked with pt about diet at home and making choices to keep blood sugar stable. Pt very talkative and excited during visit and education was not overly successful at this time.  She ate 100% of lunch and states that PTA she had not eaten for 4 days related to not feeling well. She indicates she feels she lost weight from her UBW of 210-212 lbs during this time frame. Weight on admission was consistent with UBW. Although it is noted that since 07/22/14 pt gained from 184-212 lbs.  Likely meeting needs at this time. Education likely needs reenforcement; pt was seen by DM Coordinator yesterday evening. No muscle or fat wasting but edema noted, especially to R arm. Medications reviewed; Novolog sliding scale and Levemir: 10 units at bedtime. Labs reviewed; CBGs: 37-147 mg/dL, K: 3.3 mmol/L, creatinine elevated, Ca: 7.6 mg/dL, GFR: 42.   Diet Order:  Diet Carb Modified Fluid consistency:: Thin; Room service appropriate?: Yes  Skin:  Wound (see comment) (Stage 2 ulcer to R leg, cellulitis to R arm)  Last BM:   7/12  Height:  Ht Readings from Last 1 Encounters:  09/15/14 5\' 6"  (1.676 m)    Weight:  Wt Readings from Last 1 Encounters:  09/15/14 212 lb 8 oz (96.389 kg)    Ideal Body Weight:  59.1 kg (kg)  Wt Readings from Last 10 Encounters:  09/15/14 212 lb 8 oz (96.389 kg)  07/22/14 184 lb 5 oz (83.604 kg)  10/21/13 223 lb 8.7 oz (101.4 kg)    BMI:  Body mass index is 34.31 kg/(m^2).  Estimated Nutritional Needs:  Kcal:  1800-2000  Protein:  75-90 grams  Fluid:  2 L/day  EDUCATION NEEDS:  Education needs addressed     Jarome Matin, RD, LDN Inpatient Clinical Dietitian Pager # [phone] After hours/weekend pager # [phone]

## 2014-09-16 NOTE — Progress Notes (Addendum)
Hypoglycemic Event  CBG: 52  Treatment: 15 GM carbohydrate snack  Symptoms: Sweaty  Follow-up CBG: Time:0811 CBG Result: 104  Possible Reasons for Event: Medication regimen: Patient given Levemir at HS  Comments/MD notified:    Lolita Rieger  Remember to initiate Hypoglycemia Order Set & complete

## 2014-09-17 LAB — GLUCOSE, CAPILLARY
GLUCOSE-CAPILLARY: 84 mg/dL (ref 65–99)
GLUCOSE-CAPILLARY: 75 mg/dL (ref 65–99)
GLUCOSE-CAPILLARY: 71 mg/dL (ref 65–99)
GLUCOSE-CAPILLARY: 212 mg/dL — AB (ref 65–99)
Glucose-Capillary: 102 mg/dL — ABNORMAL HIGH (ref 65–99)
Glucose-Capillary: 115 mg/dL — ABNORMAL HIGH (ref 65–99)
Glucose-Capillary: 122 mg/dL — ABNORMAL HIGH (ref 65–99)
Glucose-Capillary: 174 mg/dL — ABNORMAL HIGH (ref 65–99)
Glucose-Capillary: 85 mg/dL (ref 65–99)

## 2014-09-17 MED ORDER — POTASSIUM CHLORIDE CRYS ER 20 MEQ PO TBCR
40.0000 meq | EXTENDED_RELEASE_TABLET | Freq: Once | ORAL | Status: AC
  Administered 2014-09-17: 40 meq via ORAL
  Filled 2014-09-17: qty 2

## 2014-09-17 MED ORDER — VANCOMYCIN HCL 10 G IV SOLR
1500.0000 mg | Freq: Every day | INTRAVENOUS | Status: DC
  Administered 2014-09-17 – 2014-09-19 (×3): 1500 mg via INTRAVENOUS
  Filled 2014-09-17 (×4): qty 1500

## 2014-09-17 MED ORDER — ATENOLOL 50 MG PO TABS
50.0000 mg | ORAL_TABLET | Freq: Every day | ORAL | Status: DC
  Administered 2014-09-17 – 2014-09-20 (×4): 50 mg via ORAL
  Filled 2014-09-17 (×4): qty 1

## 2014-09-17 NOTE — Progress Notes (Signed)
Physical Therapy Treatment Patient Details Name: Tamara Stephenson MRN: [id number] DOB: 1934/03/06 Today's Date: 09/17/2014    History of Present Illness Pt is a 81 year old female with history of diabetes mellitus, hypertension, carotid stenosis, status post lumbar laminectomy for epidural abscess 10/16/2013 which patient states ever since has caused her to be wheelchair-bound and in rehabilitation, chronic back pain who presents to the ED with several hour history of right upper extremity swelling, erythema, warmth, right elbow tenderness to palpation.. Patient has a history of R LE wekness and laminectomy in 8/15.    PT Comments    Pt very talkative initially however agreeable to ambulate short distance in room as she reports she ambulates around her apt and takes care of her cats.  Pt would benefit from ST-SNF upon d/c to continue progressing mobility and functional independence.  Pt assisted with elevating R UE on pillows, states she has a difficult time positioning since R UE due to painful at times and previously with increased swelling (reports swelling better today).   Follow Up Recommendations  SNF;Supervision/Assistance - 24 hour     Equipment Recommendations  None recommended by PT    Recommendations for Other Services       Precautions / Restrictions Precautions Precautions: Fall Restrictions Weight Bearing Restrictions: No    Mobility  Bed Mobility               General bed mobility comments: pt up in recliner on arrival  Transfers Overall transfer level: Needs assistance Equipment used: Rolling walker (2 wheeled) Transfers: Sit to/from Stand Sit to Stand: Min guard         General transfer comment: verbal cues for hand placement, min/guard for safety  Ambulation/Gait Ambulation/Gait assistance: Min guard Ambulation Distance (Feet): 20 Feet Assistive device: Rolling walker (2 wheeled) Gait Pattern/deviations: Step-through pattern;Wide base of support     General Gait Details: increased R knee extension with stance, ambulated in room 10x2 with RW (pt reports she does ambulate around apt often)   Stairs            Wheelchair Mobility    Modified Rankin (Stroke Patients Only)       Balance                                    Cognition Arousal/Alertness: Awake/alert Behavior During Therapy: WFL for tasks assessed/performed Overall Cognitive Status: Within Functional Limits for tasks assessed                      Exercises      General Comments        Pertinent Vitals/Pain Pain Assessment: 0-10 Pain Score: 1  Pain Location: R UE Pain Descriptors / Indicators: Sore Pain Intervention(s): Monitored during session;Limited activity within patient's tolerance;Repositioned    Home Living                      Prior Function            PT Goals (current goals can now be found in the care plan section) Progress towards PT goals: Progressing toward goals    Frequency  Min 3X/week    PT Plan Current plan remains appropriate    Co-evaluation             End of Session   Activity Tolerance: Patient tolerated treatment well Patient left: in chair;with call  bell/phone within reach     Time: 1125-1150 PT Time Calculation (min) (ACUTE ONLY): 25 min  Charges:  $Gait Training: 8-22 mins                    G Codes:      Gadge Hermiz,[name] 27-Sep-2014, 12:55 PM [name], PT, DPT 09-27-14 Pager: [phone]

## 2014-09-17 NOTE — Progress Notes (Signed)
Clinical Social Work  Patient agreeable to SNF at [facility]. CSW spoke with [facility] who is agreeable to accept patient whenever medically stable. CSW will continue to follow.  [place], [place] [phone]

## 2014-09-17 NOTE — Progress Notes (Signed)
Hypoglycemic Event  CBG: 58  Treatment: 15 GM carbohydrate snack  Symptoms: None  Follow-up CBG: Time:2300 CBG Result:87  Possible Reasons for Event: Inadequate meal intake  Comments/MD notified: Night Coverage [place] notified, orders placed    Tamara Stephenson J  Remember to initiate Hypoglycemia Order Set & complete

## 2014-09-17 NOTE — Progress Notes (Signed)
[facility] PROGRESS NOTE  Tamara Stephenson SJG:[id number] DOB: 22-Apr-1933 DOA: 09/15/2014 PCP: Curly Rim, MD  Assessment/Plan:  Cellulitis of right upper extremity / Cellulitis in bilateral lower extremities / Pressure ulcer with abrasion, blister, partial thickness skin loss involving epidermis and/or dermis Patient still has quite a significant area of erythema in the right upper extremity which is now spreading to upper arm, above the elbow. Patient was initially improving with cefazolin, will change the antibiotic to vancomycin.  She also has lower extremity cellulitis bilaterally.  She was seen by wound care in consultation. Per wound care assessment, Bilateral edema with partial thickness wounds and erythema. Measurement: shallow partial thickness ulcer on the pretibial area the largest measures 1cm x 1.5cm x 0.1cm on the RLE. Dressing procedure/placement/frequency: Obtain ABI (study pending). Following that, pt can have [employer] and arrange for follow up either by an outpatient wound center or a HHRN.  Hypokalemia Will replace potassium  Check bmp in am.  CKD (chronic kidney disease) stage 3, GFR 30-59 ml/min - Baseline creatinine 1.4 about 11 months ago and on this admission 1.14, stable.   Anemia of chronic disease / Anemia, iron deficiency - Anemia likely secondary to combination of iron deficiency anemia and anemia of chronic kidney disease - Hemoglobin is 10.5 - No current indications for transfusion   Dyslipidemia - Continue statin therapy   Diabetes mellitus with renal manifestations, uncontrolled - A1c on this admission 7.6. - Continue Levemir 10 units at bedtime and NovoLog 3 units 3 times daily - Continue sliding scale insulin  - Blood glucose is stable  Diabetic nephropathy - Continue gabapentin 300 mg at bedtime   Fatty liver - Continue ursodiol   Obesity, Class I, BMI 30-34.9 - Body mass index is 34.31 kg/(m^2). - Nutrition consulted     Depression - Continue Wellbutrin   DVT Prophylaxis  - Lovenox   Code Status: Full code Family Communication: *No family at bedside Disposition Plan: SNF   Consultants:  None  Procedures:  None  Antibiotics: *Cefazolin  HPI/Subjective: 81 y.o. female with past medical history of NASH, diabetes, diabetic neuropathy, dyslipidemia who presented to [facility] long hospital with worsening redness, pain and swelling on right arm for past couple of days prior to this admission. Patient has lot of cats at home and reports possible cat scratches. Patient was admitted for treatment of cellulitis. She was also found to have bilateral lower extremity redness concerning for cellulitis. Wound care has seen the patient in consultation.   Patient says that the redness is now spreading to upper arm. Denies pain.  Objective: Filed Vitals:   09/17/14 0825  BP: 176/72  Pulse: 76  Temp: 98.7 F (37.1 C)  Resp: 20    Intake/Output Summary (Last 24 hours) at 09/17/14 1348 Last data filed at 09/17/14 1300  Gross per 24 hour  Intake    350 ml  Output    300 ml  Net     50 ml   Filed Weights   09/15/14 0524 09/15/14 0824  Weight: 92.987 kg (205 lb) 96.389 kg (212 lb 8 oz)    Exam:   General:  Appear in no acute distress  Cardiovascular: S1S2 RRR  Respiratory: Clear bilaterally  Abdomen: Soft, nontender  Musculoskeletal: Right upper extremity has erythema now spreading above elbow. Positive warm to touch  Data Reviewed: Basic Metabolic Panel:  Recent Labs Lab 09/15/14 0310 09/16/14 0735  NA 135 135  K 3.3* 3.3*  CL 107 105  CO2 21*  23  GLUCOSE 207* 56*  BUN 15 16  CREATININE 1.14* 1.21*  CALCIUM 8.2* 7.6*  MG 1.8  --    Liver Function Tests:  Recent Labs Lab 09/15/14 0310  AST 35  ALT 18  ALKPHOS 212*  BILITOT 1.4*  PROT 7.4  ALBUMIN 2.5*   No results for input(s): LIPASE, AMYLASE in the last 168 hours. No results for input(s): AMMONIA in the last 168  hours. CBC:  Recent Labs Lab 09/15/14 0310 09/16/14 0735  WBC 8.9 8.7  NEUTROABS 5.5  --   HGB 11.6* 10.5*  HCT 35.1* 31.2*  MCV 102.3* 102.3*  PLT 204 160   Cardiac Enzymes: No results for input(s): CKTOTAL, CKMB, CKMBINDEX, TROPONINI in the last 168 hours. BNP (last 3 results) No results for input(s): BNP in the last 8760 hours.  ProBNP (last 3 results)  Recent Labs  10/12/13 1251  PROBNP 1099.0*    CBG:  Recent Labs Lab 09/17/14 0212 09/17/14 0437 09/17/14 0628 09/17/14 0743 09/17/14 1152  GLUCAP 102* 84 75 71 122*    Recent Results (from the past 240 hour(s))  Culture, blood (routine x 2)     Status: None (Preliminary result)   Collection Time: 09/15/14  3:05 AM  Result Value Ref Range Status   Specimen Description BLOOD RIGHT ANTECUBITAL  Final   Special Requests BOTTLES DRAWN AEROBIC AND ANAEROBIC 5CC  Final   Culture   Final    NO GROWTH 2 DAYS Performed at [facility]    Report Status PENDING  Incomplete  Culture, blood (routine x 2)     Status: None (Preliminary result)   Collection Time: 09/15/14  3:15 AM  Result Value Ref Range Status   Specimen Description BLOOD LEFT ANTECUBITAL  Final   Special Requests BOTTLES DRAWN AEROBIC AND ANAEROBIC 5CC  Final   Culture   Final    NO GROWTH 2 DAYS Performed at [facility]    Report Status PENDING  Incomplete  Urine culture     Status: None   Collection Time: 09/15/14  4:31 AM  Result Value Ref Range Status   Specimen Description URINE, CATHETERIZED  Final   Special Requests NONE  Final   Culture   Final    NO GROWTH 1 DAY Performed at [facility]    Report Status 09/16/2014 FINAL  Final     Studies: No results found.  Scheduled Meds: . atenolol  50 mg Oral Daily  . buPROPion  300 mg Oral Daily  . enoxaparin (LOVENOX) injection  40 mg Subcutaneous Q24H  . ferrous sulfate  325 mg Oral Q breakfast  . gabapentin  300 mg Oral QHS  . insulin aspart  0-20 Units  Subcutaneous TID WC  . insulin detemir  5 Units Subcutaneous QHS  . lactulose  20 g Oral BID  . pantoprazole  40 mg Oral Q0600  . pravastatin  20 mg Oral q1800  . ursodiol  300 mg Oral TID  . vancomycin  1,500 mg Intravenous Q1400   Continuous Infusions:   Principal Problem:   Cellulitis of right upper extremity Active Problems:   Hypokalemia   CKD (chronic kidney disease) stage 3, GFR 30-59 ml/min   Anemia of chronic disease   Anemia, iron deficiency   Dyslipidemia   Diabetes mellitus with renal manifestations, uncontrolled   Fatty liver   Obesity, Class I, BMI 30-34.9   Diabetic nephropathy   Depression   Pressure ulcer with abrasion, blister, partial thickness  skin loss involving epidermis and/or dermis    Time spent: *25 min    [place] [facility] Pager [phone]. If 7PM-7AM, please contact night-coverage at [email or web address], password [facility] 09/17/2014, 1:48 PM  LOS: 2 days

## 2014-09-17 NOTE — Progress Notes (Addendum)
Occupational Therapy Treatment Patient Details Name: Tamara Stephenson MRN: [id number] DOB: December 13, 1933 Today's Date: 09/17/2014    History of present illness Pt is a 81 year old female with history of diabetes mellitus, hypertension, carotid stenosis, status post lumbar laminectomy for epidural abscess 10/16/2013 which patient states ever since has caused her to be wheelchair-bound and in rehabilitation, chronic back pain who presents to the ED with several hour history of right upper extremity swelling, erythema, warmth, right elbow tenderness to palpation.. Patient has a history of R LE wekness and laminectomy in 8/15.   OT comments  Pt is making good progress.  Continues to need ADL assistance due to decreased ROM/strength in RUE  Follow Up Recommendations  SNF    Equipment Recommendations  None recommended by OT    Recommendations for Other Services      Precautions / Restrictions Precautions Precautions: Fall Restrictions Weight Bearing Restrictions: No       Mobility Bed Mobility               General bed mobility comments: pt up in recliner on arrival  Transfers Overall transfer level: Needs assistance Equipment used: Rolling walker (2 wheeled) Transfers: Sit to/from Stand Sit to Stand: Min guard         General transfer comment: vcs for UE placement and min guard for safety:  ambulated to 3:1 approximately 8 feet away    Balance                                   ADL                           Toilet Transfer: Min guard;Ambulation;BSC   Toileting- Clothing Manipulation and Hygiene: Maximal assistance;Sit to/from stand         General ADL Comments: Pt's reach for hygiene is improving for hygiene.  She often gets in shower and uses hand held shower to make sure that she is thoroughly cleaned.  Pt's hand is much improved for edema, however, elbow has increased edema.  Shoulder AAROM (using LUE) is the same      Vision                     Perception     Praxis      Cognition   Behavior During Therapy: [facility] for tasks assessed/performed Overall Cognitive Status: Within Functional Limits for tasks assessed                       Extremity/Trunk Assessment               Exercises Other Exercises Other Exercises: Pt independent with RUE HEP   Shoulder Instructions       General Comments      Pertinent Vitals/ Pain       Pain Assessment: 0-10 Pain Score: 1  Pain Location: RUE Pain Descriptors / Indicators: Sore Pain Intervention(s): Monitored during session  Home Living Family/patient expects to be discharged to:: Private residence Living Arrangements: Alone                                      Prior Functioning/Environment              Frequency Min 3X/week  Progress Toward Goals  OT Goals(current goals can now be found in the care plan section)  Progress towards OT goals: Progressing toward goals (goals updated)  ADL Goals Pt Will Transfer to Toilet: with supervision;bedside commode;ambulating Pt/caregiver will Perform Home Exercise Program: With written HEP provided (met)  Plan      Co-evaluation                 End of Session     Activity Tolerance Patient tolerated treatment well   Patient Left  in chair with call bell in reach   Nurse Communication          Time:  - 1347 - 1409 22 minutes    Charges: OT General Charges $OT Visit: 1 Procedure OT Treatments $Self Care/Home Management : 8-22 mins  Dru Primeau 09/17/2014, 3:28 PM Lesle Chris, OTR/L [phone] 09/17/2014

## 2014-09-17 NOTE — Progress Notes (Signed)
ANTIBIOTIC CONSULT NOTE - INITIAL  Pharmacy Consult for Vancomycin Indication: Cellulitis  Allergies  Allergen Reactions  . Peanut-Containing Drug Products Nausea And Vomiting    Patient Measurements: Height: 5\' 6"  (167.6 cm) Weight: 212 lb 8 oz (96.389 kg) IBW/kg (Calculated) : 59.3 Adjusted Body Weight: 69 kg  Vital Signs: Temp: 98.7 F (37.1 C) (07/13 0825) Temp Source: Oral (07/13 0825) BP: 176/72 mmHg (07/13 0825) Pulse Rate: 76 (07/13 0825) Intake/Output from previous day:   Intake/Output from this shift: Total I/O In: 110 [P.O.:110] Out: 300 [Urine:300]  Labs:  Recent Labs  09/15/14 0310 09/16/14 0735  WBC 8.9 8.7  HGB 11.6* 10.5*  PLT 204 160  CREATININE 1.14* 1.21*   Estimated Creatinine Clearance: 44.8 mL/min (by C-G formula based on Cr of 1.21). No results for input(s): VANCOTROUGH, VANCOPEAK, VANCORANDOM, GENTTROUGH, GENTPEAK, GENTRANDOM, TOBRATROUGH, TOBRAPEAK, TOBRARND, AMIKACINPEAK, AMIKACINTROU, AMIKACIN in the last 72 hours.   Microbiology: Recent Results (from the past 720 hour(s))  Culture, blood (routine x 2)     Status: None (Preliminary result)   Collection Time: 09/15/14  3:05 AM  Result Value Ref Range Status   Specimen Description BLOOD RIGHT ANTECUBITAL  Final   Special Requests BOTTLES DRAWN AEROBIC AND ANAEROBIC 5CC  Final   Culture   Final    NO GROWTH 1 DAY Performed at [facility]    Report Status PENDING  Incomplete  Culture, blood (routine x 2)     Status: None (Preliminary result)   Collection Time: 09/15/14  3:15 AM  Result Value Ref Range Status   Specimen Description BLOOD LEFT ANTECUBITAL  Final   Special Requests BOTTLES DRAWN AEROBIC AND ANAEROBIC 5CC  Final   Culture   Final    NO GROWTH 1 DAY Performed at [facility]    Report Status PENDING  Incomplete  Urine culture     Status: None   Collection Time: 09/15/14  4:31 AM  Result Value Ref Range Status   Specimen Description URINE,  CATHETERIZED  Final   Special Requests NONE  Final   Culture   Final    NO GROWTH 1 DAY Performed at [facility]    Report Status 09/16/2014 FINAL  Final    Medical History: Past Medical History  Diagnosis Date  . Back pain   . Hypertension   . Carotid stenosis   . Colitis   . Diabetes mellitus without complication     Medications:  Anti-infectives    Start     Dose/Rate Route Frequency Ordered Stop   09/16/14 0600  vancomycin (VANCOCIN) 1,250 mg in sodium chloride 0.9 % 250 mL IVPB  Status:  Discontinued     1,250 mg 166.7 mL/hr over 90 Minutes Intravenous Every 24 hours 09/15/14 0648 09/15/14 1525   09/15/14 1600  ceFAZolin (ANCEF) IVPB 1 g/50 mL premix  Status:  Discontinued     1 g 100 mL/hr over 30 Minutes Intravenous 3 times per day 09/15/14 1529 09/17/14 1119   09/15/14 0800  vancomycin (VANCOCIN) 500 mg in sodium chloride 0.9 % 100 mL IVPB     500 mg 100 mL/hr over 60 Minutes Intravenous  Once 09/15/14 0648 09/15/14 0959   09/15/14 0645  cefTRIAXone (ROCEPHIN) 2 g in dextrose 5 % 50 mL IVPB  Status:  Discontinued     2 g 100 mL/hr over 30 Minutes Intravenous Every 24 hours 09/15/14 0631 09/15/14 1525   09/15/14 0430  vancomycin (VANCOCIN) IVPB 1000 mg/200 mL premix  1,000 mg 200 mL/hr over 60 Minutes Intravenous  Once 09/15/14 0415 09/15/14 1157     Assessment: 81 y.o. female with past medical history of NASH, diabetes, diabetic neuropathy, dyslipidemia who presented to [facility] long hospital with worsening redness, pain and swelling on right arm for past couple of days prior to this admission. Patient has lot of cats at home and reports possible cat scratches. Patient was admitted for treatment of cellulitis. She was also found to have bilateral lower extremity redness concerning for cellulitis.  Started on ceftriaxone and vancomycin in ED but switched to Ancef 7/11.  Now with worsening pain in elbow and pain spreading up arm, Pharmacy asked to restart  vancomycin  Temp: afebrile WBC: wnl Renal: SCr 1.2 (baseline 1.1); CrCl 45 CG  7/11 >> CTX >> 7/11 7/11 >> Vanc >> 7/11; restart 7/13 >>  7/11 >> Cefazolin >> 7/13  7/11 blood x 2: NGTD 7/11 urine: NGF   Dose changes/levels:  Goal of Therapy:  Vancomycin trough level 10-15 mcg/ml  Appropriate antibiotic dosing for renal function; eradication of infection  Plan:  Day 3 antibiotics  Vancomycin 1500mg  IV q 24 hr  Measure Vanc trough at steady state.  Follow up renal fxn, culture results, and clinical course.  Reuel Boom, PharmD, BCPS Pager: [phone] 09/17/2014, 12:17 PM

## 2014-09-17 NOTE — Care Management Important Message (Signed)
Important Message  Patient Details  Name: Tamara Stephenson MRN: [id number] Date of Birth: Jun 06, 1933   Medicare Important Message Given:  Yes-second notification given    Camillo Flaming 09/17/2014, 10:56 AMImportant Message  Patient Details  Name: Tamara Stephenson MRN: [id number] Date of Birth: [birth year]   Medicare Important Message Given:  Yes-second notification given    Camillo Flaming 09/17/2014, 10:56 AM

## 2014-09-18 DIAGNOSIS — E785 Hyperlipidemia, unspecified: Secondary | ICD-10-CM

## 2014-09-18 LAB — BASIC METABOLIC PANEL
Anion gap: 3 — ABNORMAL LOW (ref 5–15)
BUN: 24 mg/dL — ABNORMAL HIGH (ref 6–20)
CHLORIDE: 107 mmol/L (ref 101–111)
CO2: 24 mmol/L (ref 22–32)
CREATININE: 1.08 mg/dL — AB (ref 0.44–1.00)
Calcium: 7.9 mg/dL — ABNORMAL LOW (ref 8.9–10.3)
GFR calc Af Amer: 55 mL/min — ABNORMAL LOW (ref >60)
GFR, EST NON AFRICAN AMERICAN: 48 mL/min — AB (ref >60)
GLUCOSE: 193 mg/dL — AB (ref 65–99)
Potassium: 4.1 mmol/L (ref 3.5–5.1)
Sodium: 134 mmol/L — ABNORMAL LOW (ref 135–145)

## 2014-09-18 LAB — GLUCOSE, CAPILLARY
GLUCOSE-CAPILLARY: 129 mg/dL — AB (ref 65–99)
Glucose-Capillary: 164 mg/dL — ABNORMAL HIGH (ref 65–99)
Glucose-Capillary: 143 mg/dL — ABNORMAL HIGH (ref 65–99)
Glucose-Capillary: 127 mg/dL — ABNORMAL HIGH (ref 65–99)

## 2014-09-18 NOTE — Progress Notes (Signed)
Clinical Social Work  Per MD, patient is not medically stable to DC. CSW updated [facility] who remains agreeable to accept patient whenever medically stable.  [place], [place] [phone]

## 2014-09-18 NOTE — Progress Notes (Signed)
[facility] PROGRESS NOTE  Tamara Stephenson OIZ:[id number] DOB: 03/07/1934 DOA: 09/15/2014 PCP: Curly Rim, MD  HPI/Subjective: Patient reports decreased swelling and pain in right arm. She notes that it is no longer spreading. She would like to be discharged soon due to improvement.    Assessment/Plan: 1. Cellulitis of right upper extremity / Cellulitis in bilateral lower extremities / Pressure ulcer with abrasion, blister, partial thickness skin loss involving epidermis and/or dermis Patient still has a significant area of erythema in the right upper extremity but it has stopped spreading at this time, and erythematous area has lessened. Patient was changed from antibiotic Cefazolin to Vancomycin yesterday d/t worsening condition. Pt will stay one more day to receive Vancomycin therapy and observed for continued improvement.  She also has lower extremity cellulitis bilaterally. She was seen by wound care in consultation. Per wound care assessment, Bilateral edema with partial thickness wounds and erythema. Measurement: shallow partial thickness ulcer on the pretibial area the largest measures 1cm x 1.5cm x 0.1cm on the RLE. Dressing procedure/placement/frequency: Obtain ABI (study pending). Following that, pt can have Unna's Boots and arrange for follow up either by an outpatient wound center or a HHRN.  2. Hypokalemia K+ normalized at 4.1.  3. CKD (chronic kidney disease) stage 3, GFR 30-59 ml/min - Baseline creatinine 1.4 about 11 months ago and on this admission 1.14, stable.   4. Anemia of chronic disease / Anemia, iron deficiency - Anemia likely secondary to combination of iron deficiency anemia and anemia of chronic kidney disease - Follow cbc in am   5. Dyslipidemia - Continue statin therapy   6. Diabetes mellitus with renal manifestations, uncontrolled - A1c on this admission 7.6. - Continue Levemir 10 units at bedtime and NovoLog 3 units 3 times daily - Continue  sliding scale insulin    7. Diabetic nephropathy - Continue gabapentin 300 mg at bedtime   8. Fatty liver - Continue ursodiol   9. Obesity, Class I, BMI 30-34.9 - Body mass index is 34.31 kg/(m^2). - Nutrition consulted    10. Depression - Continue Wellbutrin   11. DVT Prophylaxis  - Lovenox   Code Status: Full Family Communication: Discussed plan with patient, no family at bedside Disposition Plan: possible discharged tomorrow to independent living home   Consultants:  None  Procedures:  None  Antibiotics:  Vanomycin   Objective: Filed Vitals:   09/18/14 0440  BP: 140/68  Pulse: 70  Temp: 98.7 F (37.1 C)  Resp: 20    Intake/Output Summary (Last 24 hours) at 09/18/14 1042 Last data filed at 09/17/14 1700  Gross per 24 hour  Intake    480 ml  Output    300 ml  Net    180 ml   Filed Weights   09/15/14 0524 09/15/14 0824  Weight: 92.987 kg (205 lb) 96.389 kg (212 lb 8 oz)    Exam:   General:  Patient is pleasant and sitting up in chair, appears in no acute distress  Cardiovascular: RRR, S1 and S2 noted  Respiratory: CTA bilaterally  Abdomen: Soft, non-tender  Musculoskeletal: Right upper extremity has erythema to above elbow. Positive warm to touch.  Data Reviewed: Basic Metabolic Panel:  Recent Labs Lab 09/15/14 0310 09/16/14 0735 09/18/14 0510  NA 135 135 134*  K 3.3* 3.3* 4.1  CL 107 105 107  CO2 21* 23 24  GLUCOSE 207* 56* 193*  BUN 15 16 24*  CREATININE 1.14* 1.21* 1.08*  CALCIUM 8.2* 7.6* 7.9*  MG 1.8  --   --  Liver Function Tests:  Recent Labs Lab 09/15/14 0310  AST 35  ALT 18  ALKPHOS 212*  BILITOT 1.4*  PROT 7.4  ALBUMIN 2.5*   No results for input(s): LIPASE, AMYLASE in the last 168 hours. No results for input(s): AMMONIA in the last 168 hours. CBC:  Recent Labs Lab 09/15/14 0310 09/16/14 0735  WBC 8.9 8.7  NEUTROABS 5.5  --   HGB 11.6* 10.5*  HCT 35.1* 31.2*  MCV 102.3* 102.3*  PLT 204  160   Cardiac Enzymes: No results for input(s): CKTOTAL, CKMB, CKMBINDEX, TROPONINI in the last 168 hours. BNP (last 3 results) No results for input(s): BNP in the last 8760 hours.  ProBNP (last 3 results)  Recent Labs  10/12/13 1251  PROBNP 1099.0*    CBG:  Recent Labs Lab 09/17/14 0743 09/17/14 1152 09/17/14 1648 09/17/14 2206 09/18/14 0740  GLUCAP 71 122* 174* 212* 164*    Recent Results (from the past 240 hour(s))  Culture, blood (routine x 2)     Status: None (Preliminary result)   Collection Time: 09/15/14  3:05 AM  Result Value Ref Range Status   Specimen Description BLOOD RIGHT ANTECUBITAL  Final   Special Requests BOTTLES DRAWN AEROBIC AND ANAEROBIC 5CC  Final   Culture   Final    NO GROWTH 2 DAYS Performed at [facility]    Report Status PENDING  Incomplete  Culture, blood (routine x 2)     Status: None (Preliminary result)   Collection Time: 09/15/14  3:15 AM  Result Value Ref Range Status   Specimen Description BLOOD LEFT ANTECUBITAL  Final   Special Requests BOTTLES DRAWN AEROBIC AND ANAEROBIC 5CC  Final   Culture   Final    NO GROWTH 2 DAYS Performed at [facility]    Report Status PENDING  Incomplete  Urine culture     Status: None   Collection Time: 09/15/14  4:31 AM  Result Value Ref Range Status   Specimen Description URINE, CATHETERIZED  Final   Special Requests NONE  Final   Culture   Final    NO GROWTH 1 DAY Performed at [facility]    Report Status 09/16/2014 FINAL  Final     Studies: No results found.  Scheduled Meds: . atenolol  50 mg Oral Daily  . buPROPion  300 mg Oral Daily  . enoxaparin (LOVENOX) injection  40 mg Subcutaneous Q24H  . ferrous sulfate  325 mg Oral Q breakfast  . gabapentin  300 mg Oral QHS  . insulin aspart  0-20 Units Subcutaneous TID WC  . insulin detemir  5 Units Subcutaneous QHS  . lactulose  20 g Oral BID  . pantoprazole  40 mg Oral Q0600  . pravastatin  20 mg Oral q1800   . ursodiol  300 mg Oral TID  . vancomycin  1,500 mg Intravenous Q1400   Continuous Infusions:   Principal Problem:   Cellulitis of right upper extremity Active Problems:   Hypokalemia   CKD (chronic kidney disease) stage 3, GFR 30-59 ml/min   Anemia of chronic disease   Anemia, iron deficiency   Dyslipidemia   Diabetes mellitus with renal manifestations, uncontrolled   Fatty liver   Obesity, Class I, BMI 30-34.9   Diabetic nephropathy   Depression   Pressure ulcer with abrasion, blister, partial thickness skin loss involving epidermis and/or dermis    Time spent: 25 minutes    Brandi L. Guerry Bruin, PA-S  [facility] Pager [phone].  If 7PM-7AM, please contact night-coverage at [email or web address], password [email or web address] 09/18/2014, 10:42 AM  LOS: 3 days     I have taken an interval history, reviewed the chart and examined the patient. I agree with the PA student's note, impression and recommendations. I have made any necessary editorial changes.  81 yr old female from the independent living center admitted with cellulitis, has had significant improvement on vancomycin. Possible d/c home in am.

## 2014-09-19 ENCOUNTER — Inpatient Hospital Stay (HOSPITAL_COMMUNITY): Payer: Medicare HMO

## 2014-09-19 DIAGNOSIS — K760 Fatty (change of) liver, not elsewhere classified: Secondary | ICD-10-CM

## 2014-09-19 DIAGNOSIS — I10 Essential (primary) hypertension: Secondary | ICD-10-CM

## 2014-09-19 DIAGNOSIS — E1121 Type 2 diabetes mellitus with diabetic nephropathy: Secondary | ICD-10-CM

## 2014-09-19 DIAGNOSIS — N183 Chronic kidney disease, stage 3 (moderate): Secondary | ICD-10-CM

## 2014-09-19 DIAGNOSIS — E1165 Type 2 diabetes mellitus with hyperglycemia: Secondary | ICD-10-CM

## 2014-09-19 DIAGNOSIS — D638 Anemia in other chronic diseases classified elsewhere: Secondary | ICD-10-CM

## 2014-09-19 DIAGNOSIS — F329 Major depressive disorder, single episode, unspecified: Secondary | ICD-10-CM

## 2014-09-19 DIAGNOSIS — D509 Iron deficiency anemia, unspecified: Secondary | ICD-10-CM

## 2014-09-19 DIAGNOSIS — E1129 Type 2 diabetes mellitus with other diabetic kidney complication: Secondary | ICD-10-CM

## 2014-09-19 DIAGNOSIS — L03113 Cellulitis of right upper limb: Principal | ICD-10-CM

## 2014-09-19 LAB — GLUCOSE, CAPILLARY
GLUCOSE-CAPILLARY: 133 mg/dL — AB (ref 65–99)
GLUCOSE-CAPILLARY: 149 mg/dL — AB (ref 65–99)
Glucose-Capillary: 131 mg/dL — ABNORMAL HIGH (ref 65–99)
Glucose-Capillary: 121 mg/dL — ABNORMAL HIGH (ref 65–99)

## 2014-09-19 LAB — OCCULT BLOOD X 1 CARD TO LAB, STOOL: Fecal Occult Bld: POSITIVE — AB

## 2014-09-19 MED ORDER — IOHEXOL 300 MG/ML  SOLN
150.0000 mL | Freq: Once | INTRAMUSCULAR | Status: AC | PRN
  Administered 2014-09-19: 100 mL via ORAL

## 2014-09-19 NOTE — Clinical Social Work Placement (Signed)
   CLINICAL SOCIAL WORK PLACEMENT  NOTE  Date:  09/19/2014  Patient Details  Name: Tamara Stephenson MRN: [id number] Date of Birth: 12-Jul-1933  Clinical Social Work is seeking post-discharge placement for this patient at the [place] level of care (*CSW will initial, date and re-position this form in  chart as items are completed):  Yes   Patient/family provided with [place] Work Department's list of facilities offering this level of care within the geographic area requested by the patient (or if unable, by the patient's family).  Yes   Patient/family informed of their freedom to choose among providers that offer the needed level of care, that participate in Medicare, Medicaid or managed care program needed by the patient, have an available bed and are willing to accept the patient.  Yes   Patient/family informed of [facility]'s ownership interest in [facility] and [facility], as well as of the fact that they are under no obligation to receive care at these facilities.  PASRR submitted to EDS on       PASRR number received on       Existing PASRR number confirmed on 09/19/14     FL2 transmitted to all facilities in geographic area requested by pt/family on 09/19/14     FL2 transmitted to all facilities within larger geographic area on       Patient informed that his/her managed care company has contracts with or will negotiate with certain facilities, including the following:        Yes   Patient/family informed of bed offers received.  Patient chooses bed at [facility]     Physician recommends and patient chooses bed at      Patient to be transferred to [facility] on  .  Patient to be transferred to facility by       Patient family notified on   of transfer.  Name of family member notified:        PHYSICIAN       Additional Comment:    _______________________________________________ Boone Master,  [place] 09/19/2014, 4:11 PM

## 2014-09-19 NOTE — Progress Notes (Signed)
Progress Note   Trang Bouse DJS:[id number] DOB: 25-Aug-1933 DOA: 09/15/2014 PCP: Curly Rim, MD   Brief Narrative:   Tamara Stephenson is an 81 y.o. female with a PMH of diabetes, hypertension, recent fall prior to admission was admitted 09/15/14 with fever and cellulitis of the left hand/forearm.  Assessment/Plan:   Principal problem:  Cellulitis of right upper extremity / Cellulitis in bilateral lower extremities / Pressure ulcer with abrasion, blister, partial thickness skin loss involving epidermis and/or dermis - Continue Vancomycin, erythema has regressed from inked margin. - She also has lower extremity cellulitis bilaterally. She was seen by wound care in consultation. Per wound care assessment, Bilateral edema with partial thickness wounds and erythema. Measurement: shallow partial thickness ulcer on the pretibial area the largest measures 1cm x 1.5cm x 0.1cm on the RLE. Dressing procedure/placement/frequency: ABIs showed adequate arterial flow. Following that, pt can have Unna's Boots and arrange for follow up either by an outpatient wound center or a HHRN.  Active problems:  Hypokalemia - K+ normalized at 4.1.  CKD (chronic kidney disease) stage 3, GFR 30-59 ml/min - Baseline creatinine 1.4 about 11 months ago and on this admission 1.14, stable.   Anemia of chronic disease / Anemia, iron deficiency - Anemia likely secondary to combination of iron deficiency anemia and anemia of chronic kidney disease. - Hemoglobin has been stable.   Dyslipidemia - Continue statin therapy.   Diabetes mellitus with renal manifestations, uncontrolled - A1c on this admission 7.6. - Continue Levemir 10 units at bedtime and NovoLog 3 units 3 times daily. - Continue sliding scale insulin.  CBGs 127-164.    Diabetic nephropathy - Continue gabapentin 300 mg at bedtime.   Fatty liver - Continue ursodiol.   Obesity, Class I, BMI 30-34.9 - Body mass index is 34.31 kg/(m^2). -  Nutrition consulted.    Depression - Continue Wellbutrin.   DVT Prophylaxis  - Lovenox ordered.   Family Communication: No family at the bedside. Disposition Plan: SNF when stable.  Lives alone at an assisted living facility. Code Status:     Code Status Orders        Start     Ordered   09/15/14 0740  Do not attempt resuscitation (DNR)   Continuous    Question Answer Comment  In the event of cardiac or respiratory ARREST Do not call a "code blue"   In the event of cardiac or respiratory ARREST Do not perform Intubation, CPR, defibrillation or ACLS   In the event of cardiac or respiratory ARREST Use medication by any route, position, wound care, and other measures to relive pain and suffering. May use oxygen, suction and manual treatment of airway obstruction as needed for comfort.      09/15/14 0739        IV Access:    Peripheral IV   Procedures and diagnostic studies:   Dg Chest Port 1 View  09/26/2014   CLINICAL DATA:  Wheezing  EXAM: PORTABLE CHEST - 1 VIEW  COMPARISON:  09/15/2014  FINDINGS: Heart size and vascularity normal.  Negative for pneumonia.  Mild scarring in the left lung base is unchanged. Mild elevation left hemidiaphragm unchanged. Prominent lung markings suggesting scarring.  Advanced degenerative change in both shoulder joints with rotator cuff disease bilaterally.  IMPRESSION: Mild scarring in the bases left greater than right. No superimposed acute abnormality.   Electronically Signed   By: Franchot Gallo M.D.   On: 09-26-14 09:10   Dg Esophagus W/water Raelene Bott  Cm  09/19/2014   CLINICAL DATA:  Projectile vomiting.  Dark stools last night.  EXAM: ESOPHOGRAM/BARIUM SWALLOW  TECHNIQUE: Single contrast examination was performed using 100 mL Omnipaque 300.  FLUOROSCOPY TIME:  Radiation Exposure Index (as provided by the fluoroscopic device): 7 mGy  COMPARISON:  None.  FINDINGS: The examination was performed in the semi-upright position and supine  position. There was normal pharyngeal anatomy and motility. Contrast flowed freely through the esophagus without evidence of stricture or mass. There was normal esophageal mucosa without evidence of irregularity or ulceration. Esophageal motility was normal. No evidence of reflux. No definite hiatal hernia was demonstrated.  IMPRESSION: Normal single contrast barium swallow.  No episodes of emesis during or after the exam.   Electronically Signed   By: Kathreen Devoid   On: 09/19/2014 16:04     Medical Consultants:    None.  Anti-Infectives:   Anti-infectives    Start     Dose/Rate Route Frequency Ordered Stop   09/17/14 1300  vancomycin (VANCOCIN) 1,500 mg in sodium chloride 0.9 % 500 mL IVPB     1,500 mg 250 mL/hr over 120 Minutes Intravenous Daily 09/17/14 1227     09/16/14 0600  vancomycin (VANCOCIN) 1,250 mg in sodium chloride 0.9 % 250 mL IVPB  Status:  Discontinued     1,250 mg 166.7 mL/hr over 90 Minutes Intravenous Every 24 hours 09/15/14 0648 09/15/14 1525   09/15/14 1600  ceFAZolin (ANCEF) IVPB 1 g/50 mL premix  Status:  Discontinued     1 g 100 mL/hr over 30 Minutes Intravenous 3 times per day 09/15/14 1529 09/17/14 1119   09/15/14 0800  vancomycin (VANCOCIN) 500 mg in sodium chloride 0.9 % 100 mL IVPB     500 mg 100 mL/hr over 60 Minutes Intravenous  Once 09/15/14 0648 09/15/14 0959   09/15/14 0645  cefTRIAXone (ROCEPHIN) 2 g in dextrose 5 % 50 mL IVPB  Status:  Discontinued     2 g 100 mL/hr over 30 Minutes Intravenous Every 24 hours 09/15/14 0631 09/15/14 1525   09/15/14 0430  vancomycin (VANCOCIN) IVPB 1000 mg/200 mL premix     1,000 mg 200 mL/hr over 60 Minutes Intravenous  Once 09/15/14 0415 09/15/14 0648      Subjective:   Darian Ace reports vomiting and a feeling of food getting stuck in her throat.  Reports that her right forearm swelling and erythema are much better today. Wants to go home.  Objective:    Filed Vitals:   09/18/14 2200 09/19/14 0620  09/19/14 0812 09/19/14 0907  BP: 137/54 140/66 134/53   Pulse: 70 72 75   Temp: 98.7 F (37.1 C) 98.7 F (37.1 C) 98.5 F (36.9 C)   TempSrc: Oral Oral Oral   Resp: 18 18    Height:      Weight:      SpO2: 98% 97% 95% 96%   No intake or output data in the 24 hours ending 09/19/14 9678  Exam: Gen:  NAD Cardiovascular:  RRR, No M/R/G Respiratory:  Lungs CTAB Gastrointestinal:  Abdomen soft, NT/ND, + BS Extremities:  Lower extremity swollen with some erythema, right forearm and hand are swollen with some erythema but it is regressing from inked margins   Data Reviewed:    Labs: Basic Metabolic Panel:  Recent Labs Lab 09/15/14 0310 09/16/14 0735 09/18/14 0510  NA 135 135 134*  K 3.3* 3.3* 4.1  CL 107 105 107  CO2 21* 23 24  GLUCOSE 207* 56*  193*  BUN 15 16 24*  CREATININE 1.14* 1.21* 1.08*  CALCIUM 8.2* 7.6* 7.9*  MG 1.8  --   --    GFR Estimated Creatinine Clearance: 50.2 mL/min (by C-G formula based on Cr of 1.08). Liver Function Tests:  Recent Labs Lab 09/15/14 0310  AST 35  ALT 18  ALKPHOS 212*  BILITOT 1.4*  PROT 7.4  ALBUMIN 2.5*   CBC:  Recent Labs Lab 09/15/14 0310 09/16/14 0735  WBC 8.9 8.7  NEUTROABS 5.5  --   HGB 11.6* 10.5*  HCT 35.1* 31.2*  MCV 102.3* 102.3*  PLT 204 160   BNP (last 3 results)  Recent Labs  10/12/13 1251  PROBNP 1099.0*   CBG:  Recent Labs Lab 09/18/14 0740 09/18/14 1351 09/18/14 1716 09/18/14 2209 09/19/14 0741  GLUCAP 164* 129* 143* 127* 131*   Sepsis Labs:  Recent Labs Lab 09/15/14 0310 09/15/14 0339 09/16/14 0735  WBC 8.9  --  8.7  LATICACIDVEN  --  1.20  --    Microbiology Recent Results (from the past 240 hour(s))  Culture, blood (routine x 2)     Status: None (Preliminary result)   Collection Time: 09/15/14  3:05 AM  Result Value Ref Range Status   Specimen Description BLOOD RIGHT ANTECUBITAL  Final   Special Requests BOTTLES DRAWN AEROBIC AND ANAEROBIC 5CC  Final   Culture    Final    NO GROWTH 3 DAYS Performed at [facility]    Report Status PENDING  Incomplete  Culture, blood (routine x 2)     Status: None (Preliminary result)   Collection Time: 09/15/14  3:15 AM  Result Value Ref Range Status   Specimen Description BLOOD LEFT ANTECUBITAL  Final   Special Requests BOTTLES DRAWN AEROBIC AND ANAEROBIC 5CC  Final   Culture   Final    NO GROWTH 3 DAYS Performed at [facility]    Report Status PENDING  Incomplete  Urine culture     Status: None   Collection Time: 09/15/14  4:31 AM  Result Value Ref Range Status   Specimen Description URINE, CATHETERIZED  Final   Special Requests NONE  Final   Culture   Final    NO GROWTH 1 DAY Performed at [facility]    Report Status 09/16/2014 FINAL  Final     Medications:   . atenolol  50 mg Oral Daily  . buPROPion  300 mg Oral Daily  . enoxaparin (LOVENOX) injection  40 mg Subcutaneous Q24H  . ferrous sulfate  325 mg Oral Q breakfast  . gabapentin  300 mg Oral QHS  . insulin aspart  0-20 Units Subcutaneous TID WC  . insulin detemir  5 Units Subcutaneous QHS  . lactulose  20 g Oral BID  . pantoprazole  40 mg Oral Q0600  . pravastatin  20 mg Oral q1800  . ursodiol  300 mg Oral TID  . vancomycin  1,500 mg Intravenous Q1400   Continuous Infusions:   Time spent: 35 minutes with > 50% of time discussing current diagnostic test results, clinical impression and plan of care.   LOS: 4 days   [facility] Pager [phone]. If unable to reach me by pager, please call my cell phone at [phone].  *Please refer to [email or web address], password [email or web address] to get updated schedule on who will round on this patient, as hospitalists switch teams weekly. If 7PM-7AM, please contact night-coverage at [email or web address], password [email or web address] for any overnight needs.  09/19/2014,  9:38 AM

## 2014-09-19 NOTE — Progress Notes (Signed)
Pt. Had one episode of projectile vomiting this am abdomen soft but distended, pt refused iv zofran stated that she had a similar episode on Monday and that she was sure it was just the soup that she ate last night.

## 2014-09-19 NOTE — Progress Notes (Signed)
Physical Therapy Treatment Patient Details Name: Tamara Stephenson MRN: [id number] DOB: [birth year] Today's Date: 09/19/2014    History of Present Illness Pt is a 81 year old female with history of diabetes mellitus, hypertension, carotid stenosis, status post lumbar laminectomy for epidural abscess 10/16/2013 which patient states ever since has caused her to be wheelchair-bound and in rehabilitation, chronic back pain who presents to the ED with several hour history of right upper extremity swelling, erythema, warmth, right elbow tenderness to palpation. Patient has a history of R LE weakness and laminectomy in 8/15.    PT Comments    Pt ambulated around room only and then assisted back to bed.  Pt very hopeful to d/c later today or tomorrow.  Follow Up Recommendations  SNF;Supervision/Assistance - 24 hour     Equipment Recommendations  None recommended by PT    Recommendations for Other Services       Precautions / Restrictions Precautions Precautions: Fall    Mobility  Bed Mobility Overal bed mobility: Needs Assistance       Supine to sit: Supervision;HOB elevated Sit to supine: Mod assist;HOB elevated   General bed mobility comments: relies on upper extremities to assist, mod assist for LEs upon return to bed due to fatigue  Transfers Overall transfer level: Needs assistance Equipment used: Rolling walker (2 wheeled) Transfers: Sit to/from [facility] Sit to Stand: Min guard Stand pivot transfers: Min guard       General transfer comment: verbal cues for hand placement, min/guard for safety  Ambulation/Gait Ambulation/Gait assistance: Min guard Ambulation Distance (Feet): 20 Feet Assistive device: Rolling walker (2 wheeled) Gait Pattern/deviations: Step-to pattern;Wide base of support     General Gait Details: increased R knee extension and external rotation and/or toe out with stance, ambulated in room, increased work of breathing and pt reports  fatigue upon return to bed   Stairs            Wheelchair Mobility    Modified Rankin (Stroke Patients Only)       Balance                                    Cognition Arousal/Alertness: Awake/alert Behavior During Therapy: WFL for tasks assessed/performed Overall Cognitive Status: Within Functional Limits for tasks assessed                      Exercises      General Comments        Pertinent Vitals/Pain Pain Assessment: 0-10 Pain Score: 1  Pain Location: R UE Pain Descriptors / Indicators: Sore Pain Intervention(s): Monitored during session    Home Living                      Prior Function            PT Goals (current goals can now be found in the care plan section) Progress towards PT goals: Progressing toward goals    Frequency  Min 3X/week    PT Plan Current plan remains appropriate    Co-evaluation             End of Session   Activity Tolerance: Patient limited by fatigue Patient left: with call bell/phone within reach;in bed;with bed alarm set     Time: 0814-4818 PT Time Calculation (min) (ACUTE ONLY): 15 min  Charges:  $Gait Training: 8-22 mins  G Codes:      [name],[name] 09/25/2014, 3:05 PM [name], PT, DPT 09/25/14 Pager: [phone]

## 2014-09-19 NOTE — Progress Notes (Addendum)
Pt had another episode of projectile vomiting after sips of water. Pt only ate a cup of soup yesterday. Pt and night RN also stated she had a dark stool last night. Abdomen distended-upper abdomen firm. Expiratory wheezes audible without stethoscope. Pt states she has no pain but feels very uncomfortable. VSs okay. Paged MD. Continue to monitor with new orders placed.

## 2014-09-19 NOTE — Progress Notes (Signed)
Clinical Social Work  SNF agreeable to accept patient tomorrow if medically stable. Weekend CSW to contact [place] at [facility] at [phone] and signed FL2/DNR on chart.  [place], [place] [phone]

## 2014-09-20 LAB — CULTURE, BLOOD (ROUTINE X 2)
Culture: NO GROWTH
Culture: NO GROWTH

## 2014-09-20 LAB — GLUCOSE, CAPILLARY
GLUCOSE-CAPILLARY: 179 mg/dL — AB (ref 65–99)
Glucose-Capillary: 111 mg/dL — ABNORMAL HIGH (ref 65–99)

## 2014-09-20 MED ORDER — ONDANSETRON HCL 4 MG PO TABS: 4.0000 mg | ORAL_TABLET | Freq: Four times a day (QID) | ORAL | Status: DC | PRN

## 2014-09-20 MED ORDER — OXYCODONE HCL 5 MG PO TABS: 5.0000 mg | ORAL_TABLET | ORAL | Status: DC | PRN

## 2014-09-20 MED ORDER — SULFAMETHOXAZOLE-TRIMETHOPRIM 800-160 MG PO TABS: 1.0000 | ORAL_TABLET | Freq: Two times a day (BID) | ORAL | Status: AC

## 2014-09-20 NOTE — Clinical Social Work Placement (Signed)
   CLINICAL SOCIAL WORK PLACEMENT  NOTE  Date:  09/20/2014  Patient Details  Name: Tamara Stephenson MRN: [id number] Date of Birth: [birth year]  Clinical Social Work is seeking post-discharge placement for this patient at the [place] level of care (*CSW will initial, date and re-position this form in  chart as items are completed):  Yes   Patient/family provided with [facility] Department's list of facilities offering this level of care within the geographic area requested by the patient (or if unable, by the patient's family).  Yes   Patient/family informed of their freedom to choose among providers that offer the needed level of care, that participate in Medicare, Medicaid or managed care program needed by the patient, have an available bed and are willing to accept the patient.  Yes   Patient/family informed of [facility]'s ownership interest in [facility] and [facility], as well as of the fact that they are under no obligation to receive care at these facilities.  PASRR submitted to EDS on       PASRR number received on       Existing PASRR number confirmed on 09/19/14     FL2 transmitted to all facilities in geographic area requested by pt/family on 09/19/14     FL2 transmitted to all facilities within larger geographic area on       Patient informed that his/her managed care company has contracts with or will negotiate with certain facilities, including the following:        Yes   Patient/family informed of bed offers received.  Patient chooses bed at [facility]     Physician recommends and patient chooses bed at      Patient to be transferred to [facility] on  .September 20, 2014  Patient to be transferred to facility by   ambulance    Patient family notified on  July16, 2016 of transfer.  Name of family member notified:   patient     PHYSICIAN       Additional Comment:     _______________________________________________ Carlean Jews, LCSW 09/20/2014, 12:59 PM

## 2014-09-20 NOTE — Progress Notes (Signed)
Occupational Therapy Treatment Patient Details Name: Tamara Stephenson MRN: [id number] DOB: 05/15/1933 Today's Date: 09/20/2014          Follow Up Recommendations  SNF    Equipment Recommendations  None recommended by OT       Precautions / Restrictions Precautions Precautions: Fall       Mobility Bed Mobility Overal bed mobility: Needs Assistance Bed Mobility: Sit to Supine       Sit to supine: Max assist      Transfers Overall transfer level: Needs assistance Equipment used: 1 person hand held assist Transfers: Sit to/from [facility] Sit to Stand: Mod assist Stand pivot transfers: Mod assist       General transfer comment: verbal cues for hand placement, min/guard for safety        ADL                       Lower Body Dressing: Sit to/from stand;Cueing for safety;Maximal assistance   Toilet Transfer: Moderate assistance;Regular Toilet;RW Armed forces technical officer Details (indicate cue type and reason): chair to bed Toileting- Clothing Manipulation and Hygiene: Maximal assistance;Sit to/from stand       Functional mobility during ADLs: Cueing for safety;Moderate assistance;Rolling walker        Vision                            Cognition   Behavior During Therapy: WFL for tasks assessed/performed Overall Cognitive Status: Within Functional Limits for tasks assessed                       Extremity/Trunk Assessment               Exercises  Pt is I with HEP   Shoulder Instructions       General Comments      Pertinent Vitals/ Pain       Pain Assessment: No/denies pain  Home Living                                          Prior Functioning/Environment              Frequency       Progress Toward Goals  OT Goals(current goals can now be found in the care plan section)  Progress towards OT goals: Progressing toward goals     Plan Discharge plan remains appropriate     Co-evaluation                 End of Session     Activity Tolerance Patient tolerated treatment well   Patient Left in bed   Nurse Communication Mobility status        Time: 2330-0762 OT Time Calculation (min): 15 min  Charges: OT General Charges $OT Visit: 1 Procedure OT Treatments $Self Care/Home Management : 8-22 mins $Therapeutic Activity: 8-22 mins  Corry Ihnen D 09/20/2014, 2:49 PM

## 2014-09-20 NOTE — Discharge Summary (Signed)
Physician Discharge Summary  Tamara Stephenson KWI:[id number] DOB: 03-27-1933 DOA: 09/15/2014  PCP: Curly Rim, MD  Admit date: 09/15/2014 Discharge date: 09/20/2014   Recommendations for Outpatient Follow-Up:   1. F/U with PCP in 1 week to ensure resolution of cellulitis. Please F/U on final blood culture results. 2. Change Unna boots weekly.   Discharge Diagnosis:   Principal Problem:    Cellulitis of right upper extremity Active Problems:    Hypokalemia    CKD (chronic kidney disease) stage 3, GFR 30-59 ml/min    Anemia of chronic disease    Anemia, iron deficiency    Dyslipidemia    Diabetes mellitus with renal manifestations, uncontrolled    Fatty liver    Obesity, Class I, BMI 30-34.9    Diabetic nephropathy    Depression    Pressure ulcer with abrasion, blister, partial thickness skin loss involving epidermis and/or dermis    Transient dysphagia   Discharge disposition:  SNF: [facility].  Discharge Condition: Improved.  Diet recommendation:  Carbohydrate-modified.   Wound care: Unna boots BLE, change weekly.   History of Present Illness:   Tamara Stephenson is an 81 y.o. female with a PMH of diabetes, hypertension, recent fall prior to admission was admitted 09/15/14 with fever and cellulitis of the left hand/forearm.  Hospital Course by Problem:   Principal problem:  Cellulitis of right upper extremity / Cellulitis in bilateral lower extremities / Pressure ulcer with abrasion, blister, partial thickness skin loss involving epidermis and/or dermis - Improved on Vancomycin, erythema has regressed from inked margin. D/C on 1 week of treatment with Bactrim. - She also has lower extremity cellulitis bilaterally. She was seen by wound care in consultation. Per wound care assessment, Bilateral edema with partial thickness wounds and erythema. Measurement: shallow partial thickness ulcer on the pretibial area the largest measures 1cm x 1.5cm x 0.1cm on  the RLE. Dressing procedure/placement/frequency: ABIs showed adequate arterial flow. Following that, pt can have Unna's Boots and arrange for follow up either by an outpatient wound center or a HHRN.  Active problems:  Hypokalemia - K+ normalized at 4.1.  CKD (chronic kidney disease) stage 3, GFR 30-59 ml/min - Baseline creatinine 1.4 about 11 months ago and on this admission 1.14, stable.   Anemia of chronic disease / Anemia, iron deficiency - Anemia likely secondary to combination of iron deficiency anemia and anemia of chronic kidney disease. - Hemoglobin has been stable.   Dyslipidemia - Continue statin therapy.   Diabetes mellitus with renal manifestations, uncontrolled - A1c on this admission 7.6. - Resume home regimen at D/C.  Diabetic nephropathy - Continue gabapentin 300 mg at bedtime.   Fatty liver - Continue ursodiol.   Obesity, Class I, BMI 30-34.9 - Body mass index is 34.31 kg/(m^2).   Depression - Continue Wellbutrin.  Transient dysphagia - Esophagram negative.  Medical Consultants:    None.   Discharge Exam:   Filed Vitals:   09/20/14 0604  BP: 118/58  Pulse: 68  Temp: 98.2 F (36.8 C)  Resp: 18   Filed Vitals:   09/19/14 0812 09/19/14 0907 09/19/14 2101 09/20/14 0604  BP: 134/53  123/71 118/58  Pulse: 75  65 68  Temp: 98.5 F (36.9 C)  98.4 F (36.9 C) 98.2 F (36.8 C)  TempSrc: Oral  Oral Oral  Resp:   18 18  Height:      Weight:    101.923 kg (224 lb 11.2 oz)  SpO2: 95% 96% 98% 97%  Gen:  NAD Cardiovascular:  RRR, No M/R/G Respiratory: Lungs CTAB Gastrointestinal: Abdomen soft, NT/ND with normal active bowel sounds. Extremities: No C/E/C   The results of significant diagnostics from this hospitalization (including imaging, microbiology, ancillary and laboratory) are listed below for reference.     Procedures and Diagnostic Studies:   Dg Elbow 2 Views Right  09/15/2014   CLINICAL DATA:  Fever with purple spot on  the right forearm. No injury.  EXAM: RIGHT ELBOW - 2 VIEW  COMPARISON:  None.  FINDINGS: Infiltration or edema in the soft tissues over the right elbow. No significant effusion. No evidence of acute fracture or dislocation. Old ununited ossicles adjacent to the lateral epicondyle of the humerus. No destructive bone lesions appreciated.  IMPRESSION: Soft tissue edema.  No acute bony abnormalities.   Electronically Signed   By: Lucienne Capers M.D.   On: 09/15/2014 06:31   Dg Forearm Right  09/15/2014   CLINICAL DATA:  Fever with purple spot on the right forearm. No injury.  EXAM: RIGHT FOREARM - 2 VIEW  COMPARISON:  None.  FINDINGS: Degenerative changes in the right wrist and first carpometacarpal joint. There appears to be infiltration or edema in the subcutaneous fatty tissues of the right forearm. No soft tissue foreign body or gas collections identified. No evidence of acute fracture or dislocation in the right radius or ulna.  IMPRESSION: Soft tissue infiltration or edema. Degenerative changes in the wrist. No acute bony abnormalities.   Electronically Signed   By: Lucienne Capers M.D.   On: 09/15/2014 06:30   Dg Chest Port 1 View  09/19/2014   CLINICAL DATA:  Wheezing  EXAM: PORTABLE CHEST - 1 VIEW  COMPARISON:  09/15/2014  FINDINGS: Heart size and vascularity normal.  Negative for pneumonia.  Mild scarring in the left lung base is unchanged. Mild elevation left hemidiaphragm unchanged. Prominent lung markings suggesting scarring.  Advanced degenerative change in both shoulder joints with rotator cuff disease bilaterally.  IMPRESSION: Mild scarring in the bases left greater than right. No superimposed acute abnormality.   Electronically Signed   By: Franchot Gallo M.D.   On: 09/19/2014 09:10   Dg Chest Port 1 View  09/15/2014   CLINICAL DATA:  Fever. Purple spot on the right forearm. Right forearm pain.  EXAM: PORTABLE CHEST - 1 VIEW  COMPARISON:  10/17/2013  FINDINGS: Borderline heart size and  pulmonary vascularity are probably normal for technique. No focal airspace disease or consolidation. No blunting of costophrenic angles. Linear scarring in the left costophrenic angle. Calcified and tortuous aorta. Degenerative changes in the shoulders and spine.  IMPRESSION: No evidence of active pulmonary disease. Scarring in the left lung base.   Electronically Signed   By: Lucienne Capers M.D.   On: 09/15/2014 03:09   Dg Esophagus W/water Sol Cm  09/19/2014   CLINICAL DATA:  Projectile vomiting.  Dark stools last night.  EXAM: ESOPHOGRAM/BARIUM SWALLOW  TECHNIQUE: Single contrast examination was performed using 100 mL Omnipaque 300.  FLUOROSCOPY TIME:  Radiation Exposure Index (as provided by the fluoroscopic device): 7 mGy  COMPARISON:  None.  FINDINGS: The examination was performed in the semi-upright position and supine position. There was normal pharyngeal anatomy and motility. Contrast flowed freely through the esophagus without evidence of stricture or mass. There was normal esophageal mucosa without evidence of irregularity or ulceration. Esophageal motility was normal. No evidence of reflux. No definite hiatal hernia was demonstrated.  IMPRESSION: Normal single contrast barium swallow.  No episodes  of emesis during or after the exam.   Electronically Signed   By: Kathreen Devoid   On: 09/19/2014 16:04     Labs:   Basic Metabolic Panel:  Recent Labs Lab 09/15/14 0310 09/16/14 0735 09/18/14 0510  NA 135 135 134*  K 3.3* 3.3* 4.1  CL 107 105 107  CO2 21* 23 24  GLUCOSE 207* 56* 193*  BUN 15 16 24*  CREATININE 1.14* 1.21* 1.08*  CALCIUM 8.2* 7.6* 7.9*  MG 1.8  --   --    GFR Estimated Creatinine Clearance: 51.7 mL/min (by C-G formula based on Cr of 1.08). Liver Function Tests:  Recent Labs Lab 09/15/14 0310  AST 35  ALT 18  ALKPHOS 212*  BILITOT 1.4*  PROT 7.4  ALBUMIN 2.5*   CBC:  Recent Labs Lab 09/15/14 0310 09/16/14 0735  WBC 8.9 8.7  NEUTROABS 5.5  --   HGB  11.6* 10.5*  HCT 35.1* 31.2*  MCV 102.3* 102.3*  PLT 204 160   CBG:  Recent Labs Lab 09/19/14 0741 09/19/14 1157 09/19/14 1634 09/19/14 2302 09/20/14 0739  GLUCAP 131* 121* 133* 149* 111*   Microbiology Recent Results (from the past 240 hour(s))  Culture, blood (routine x 2)     Status: None (Preliminary result)   Collection Time: 09/15/14  3:05 AM  Result Value Ref Range Status   Specimen Description BLOOD RIGHT ANTECUBITAL  Final   Special Requests BOTTLES DRAWN AEROBIC AND ANAEROBIC 5CC  Final   Culture   Final    NO GROWTH 4 DAYS Performed at [facility]    Report Status PENDING  Incomplete  Culture, blood (routine x 2)     Status: None (Preliminary result)   Collection Time: 09/15/14  3:15 AM  Result Value Ref Range Status   Specimen Description BLOOD LEFT ANTECUBITAL  Final   Special Requests BOTTLES DRAWN AEROBIC AND ANAEROBIC 5CC  Final   Culture   Final    NO GROWTH 4 DAYS Performed at [facility]    Report Status PENDING  Incomplete  Urine culture     Status: None   Collection Time: 09/15/14  4:31 AM  Result Value Ref Range Status   Specimen Description URINE, CATHETERIZED  Final   Special Requests NONE  Final   Culture   Final    NO GROWTH 1 DAY Performed at [facility]    Report Status 09/16/2014 FINAL  Final     Discharge Instructions:   Discharge Instructions    Call MD for:  extreme fatigue    Complete by:  As directed      Call MD for:  severe uncontrolled pain    Complete by:  As directed      Call MD for:  temperature >100.4    Complete by:  As directed      Diet Carb Modified    Complete by:  As directed      Increase activity slowly    Complete by:  As directed      Walk with assistance    Complete by:  As directed      Walker     Complete by:  As directed             Medication List    STOP taking these medications        lactulose 10 GM/15ML solution  Commonly known as:  [place]  these medications  buPROPion 300 MG 24 hr tablet  Commonly known as:  WELLBUTRIN XL  Take 300 mg by mouth daily.     ferrous sulfate 325 (65 FE) MG tablet  Take 325 mg by mouth daily with breakfast.     gabapentin 300 MG capsule  Commonly known as:  NEURONTIN  Take 1 capsule (300 mg total) by mouth at bedtime.     insulin aspart 100 UNIT/ML injection  Commonly known as:  novoLOG  Inject 3 Units into the skin 3 (three) times daily with meals.     insulin detemir 100 UNIT/ML injection  Commonly known as:  LEVEMIR  Inject 0.12 mLs (12 Units total) into the skin at bedtime.     lovastatin 20 MG tablet  Commonly known as:  MEVACOR  Take 20 mg by mouth at bedtime.     ondansetron 4 MG tablet  Commonly known as:  ZOFRAN  Take 1 tablet (4 mg total) by mouth every 6 (six) hours as needed for nausea.     oxyCODONE 5 MG immediate release tablet  Commonly known as:  Oxy IR/ROXICODONE  Take 1 tablet (5 mg total) by mouth every 4 (four) hours as needed for severe pain.     polyethylene glycol packet  Commonly known as:  MIRALAX / GLYCOLAX  Take 17 g by mouth daily.     ranitidine 300 MG tablet  Commonly known as:  ZANTAC  Take 300 mg by mouth at bedtime.     sulfamethoxazole-trimethoprim 800-160 MG per tablet  Commonly known as:  BACTRIM DS,SEPTRA DS  Take 1 tablet by mouth 2 (two) times daily.     ursodiol 300 MG capsule  Commonly known as:  ACTIGALL  Take 300 mg by mouth 3 (three) times daily.     Vitamin D 2000 UNITS tablet  Take 2,000 Units by mouth daily.           Follow-up Information    Follow up with CORRINGTON,KIP A, MD. Schedule an appointment as soon as possible for a visit in 1 week.   Specialty:  Family Medicine   Contact information:   [phone] [place] [address] [phone]        Time coordinating discharge: 35 minutes.  Signed:  Xane Amsden  Pager [phone] [facility] 09/20/2014, 9:38  AM

## 2014-09-20 NOTE — Progress Notes (Signed)
Pt has been discharged to SNF. Left unit on stretcher pushed by ambulance's personnel. Left in good condition. Vwilliams,rn.

## 2014-09-20 NOTE — Discharge Instructions (Signed)

## 2014-09-20 NOTE — Progress Notes (Signed)
Report called and given to [facility], lpn at [facility]. Tamara Stephenson reports pt is going to rm 36 on the front hall.

## 2014-11-05 ENCOUNTER — Emergency Department (HOSPITAL_COMMUNITY): Payer: Medicare HMO

## 2014-11-05 ENCOUNTER — Encounter (HOSPITAL_COMMUNITY): Payer: Self-pay | Admitting: Emergency Medicine

## 2014-11-05 ENCOUNTER — Emergency Department (HOSPITAL_COMMUNITY)
Admission: EM | Admit: 2014-11-05 | Discharge: 2014-11-05 | Disposition: A | Discharge status: Home / Self Care | Payer: Medicare HMO | Attending: Emergency Medicine | Admitting: Emergency Medicine

## 2014-11-05 DIAGNOSIS — F22 Delusional disorders: Secondary | ICD-10-CM

## 2014-11-05 DIAGNOSIS — Z720 Tobacco use: Secondary | ICD-10-CM | POA: Insufficient documentation

## 2014-11-05 DIAGNOSIS — I10 Essential (primary) hypertension: Secondary | ICD-10-CM | POA: Insufficient documentation

## 2014-11-05 DIAGNOSIS — R443 Hallucinations, unspecified: Secondary | ICD-10-CM | POA: Diagnosis present

## 2014-11-05 DIAGNOSIS — E119 Type 2 diabetes mellitus without complications: Secondary | ICD-10-CM | POA: Insufficient documentation

## 2014-11-05 DIAGNOSIS — Z7982 Long term (current) use of aspirin: Secondary | ICD-10-CM | POA: Diagnosis not present

## 2014-11-05 DIAGNOSIS — Z8719 Personal history of other diseases of the digestive system: Secondary | ICD-10-CM | POA: Diagnosis not present

## 2014-11-05 DIAGNOSIS — Z794 Long term (current) use of insulin: Secondary | ICD-10-CM | POA: Diagnosis not present

## 2014-11-05 DIAGNOSIS — Z79899 Other long term (current) drug therapy: Secondary | ICD-10-CM | POA: Diagnosis not present

## 2014-11-05 LAB — COMPREHENSIVE METABOLIC PANEL
ALBUMIN: 2.4 g/dL — AB (ref 3.5–5.0)
ALK PHOS: 187 U/L — AB (ref 38–126)
ALT: 16 U/L (ref 14–54)
AST: 36 U/L (ref 15–41)
Anion gap: 8 (ref 5–15)
BUN: 17 mg/dL (ref 6–20)
CALCIUM: 8.5 mg/dL — AB (ref 8.9–10.3)
CO2: 26 mmol/L (ref 22–32)
CREATININE: 1.52 mg/dL — AB (ref 0.44–1.00)
Chloride: 108 mmol/L (ref 101–111)
GFR calc Af Amer: 37 mL/min — ABNORMAL LOW (ref >60)
GFR calc non Af Amer: 32 mL/min — ABNORMAL LOW (ref >60)
GLUCOSE: 129 mg/dL — AB (ref 65–99)
Potassium: 3.2 mmol/L — ABNORMAL LOW (ref 3.5–5.1)
SODIUM: 142 mmol/L (ref 135–145)
Total Bilirubin: 1.3 mg/dL — ABNORMAL HIGH (ref 0.3–1.2)
Total Protein: 7.1 g/dL (ref 6.5–8.1)

## 2014-11-05 LAB — CBC WITH DIFFERENTIAL/PLATELET
Basophils Absolute: 0 10*3/uL (ref 0.0–0.1)
Basophils Relative: 1 % (ref 0–1)
EOS ABS: 0.5 10*3/uL (ref 0.0–0.7)
Eosinophils Relative: 7 % — ABNORMAL HIGH (ref 0–5)
HEMATOCRIT: 31.2 % — AB (ref 36.0–46.0)
Hemoglobin: 10.2 g/dL — ABNORMAL LOW (ref 12.0–15.0)
Lymphocytes Relative: 31 % (ref 12–46)
Lymphs Abs: 2.4 10*3/uL (ref 0.7–4.0)
MCH: 33.4 pg (ref 26.0–34.0)
MCHC: 32.7 g/dL (ref 30.0–36.0)
MCV: 102.3 fL — ABNORMAL HIGH (ref 78.0–100.0)
MONOS PCT: 11 % (ref 3–12)
Monocytes Absolute: 0.9 10*3/uL (ref 0.1–1.0)
NEUTROS PCT: 50 % (ref 43–77)
Neutro Abs: 4 10*3/uL (ref 1.7–7.7)
Platelets: 205 10*3/uL (ref 150–400)
RBC: 3.05 MIL/uL — ABNORMAL LOW (ref 3.87–5.11)
RDW: 14.6 % (ref 11.5–15.5)
WBC: 7.9 10*3/uL (ref 4.0–10.5)

## 2014-11-05 LAB — URINALYSIS, ROUTINE W REFLEX MICROSCOPIC
Bilirubin Urine: NEGATIVE
GLUCOSE, UA: NEGATIVE mg/dL
Ketones, ur: NEGATIVE mg/dL
Leukocytes, UA: NEGATIVE
Nitrite: NEGATIVE
Protein, ur: NEGATIVE mg/dL
Specific Gravity, Urine: 1.013 (ref 1.005–1.030)
Urobilinogen, UA: 1 mg/dL (ref 0.0–1.0)
pH: 7 (ref 5.0–8.0)

## 2014-11-05 LAB — URINE MICROSCOPIC-ADD ON

## 2014-11-05 LAB — I-STAT CG4 LACTIC ACID, ED: Lactic Acid, Venous: 0.93 mmol/L (ref 0.5–2.0)

## 2014-11-05 NOTE — ED Notes (Signed)
Bed: WA17 Expected date:  Expected time:  Means of arrival:  Comments: EMS- 81yo F, hallucinations

## 2014-11-05 NOTE — ED Notes (Signed)
Pt comes in today with EMS from [facility] with a c/o hallucinations. Staff called EMS d/t pt stating that she saw about 20 people in her room and when staff arrived in room they all disappeared. Pt states that she was having conversations with them and even saw them in the mirror. Pt denies any pain.

## 2014-11-05 NOTE — Progress Notes (Signed)
CSW met with patient at bedside. There was no family present. Per note, patient presents to [facility] due to hallucinations.  Patient confirms that she is from [facility]. According to patient, she has been living at the facility for 2 years. Patient says that she lives in the facilities Independent Unit. Patient states that she does not fall often.  Patient informed CSW that she does not have any family in [place]. Patient states that her family is in [place]. Patient states that she feels safe at [facility].  Willette Brace [phone] ED CSW 11/05/2014 8:58 PM

## 2014-11-05 NOTE — ED Notes (Addendum)
[place] and spoke to [place] about getting patient back to facility.

## 2014-11-05 NOTE — ED Provider Notes (Signed)
CSN: [id number]     Arrival date & time 11/05/14  1644 History   First MD Initiated Contact with Patient 11/05/14 1706     Chief Complaint  Patient presents with  . Hallucinations     (Consider location/radiation/quality/duration/timing/severity/associated sxs/prior Treatment) HPI   Tamara Stephenson is a 81 y.o. female who is here for evaluation of stated hallucinations. Patient easily reports a number of things which have occurred over a long period of time which are the stated reason that she is here for. These include:  "someone wrote on a bag then it disappeared.  A human turned into a leaf.  People are there then they disappear.  The people are unfamiliar.   A woman was wrapped in toilet paper like a mummy"  These visions make her uncomfortable. She is also drooling from the right side of her mouth for 4 weeks. She denies headache, neck pain, back pain, nausea or vomiting. There are no other known modifying factors.     Past Medical History  Diagnosis Date  . Back pain   . Hypertension   . Carotid stenosis   . Colitis   . Diabetes mellitus without complication    Past Surgical History  Procedure Laterality Date  . Carotid endarterectomy    . Lumbar laminectomy for epidural abscess N/A 10/16/2013    Procedure: LUMBAR LAMINECTOMY FOR EPIDURAL ABSCESS Lumbar Two through Four;  Surgeon: Charlie Pitter, MD;  Location: [facility];  Service: Neurosurgery;  Laterality: N/A;   Family History  Problem Relation Age of Onset  . Leukemia Mother   . Jaundice Father    Social History  Substance Use Topics  . Smoking status: Current Every Day Smoker -- 0.20 packs/day  . Smokeless tobacco: None  . Alcohol Use: No   OB History    No data available     Review of Systems  All other systems reviewed and are negative.     Allergies  Peanut-containing drug products  Home Medications   Prior to Admission medications   Medication Sig Start Date End Date Taking? Authorizing  Provider  acetaminophen (TYLENOL) 500 MG tablet Take 500 mg by mouth every 6 (six) hours as needed.   Yes Historical Provider, MD  aspirin (ASPIRIN EC) 81 MG EC tablet Take 162 mg by mouth at bedtime. Swallow whole.   Yes Historical Provider, MD  atenolol (TENORMIN) 100 MG tablet Take 150 mg by mouth daily. Take one and a half tablets daily 07/10/14  Yes Historical Provider, MD  buPROPion (WELLBUTRIN XL) 300 MG 24 hr tablet Take 300 mg by mouth daily.   Yes Historical Provider, MD  cholecalciferol (VITAMIN D) 1000 UNITS tablet Take 1,000 Units by mouth daily.   Yes Historical Provider, MD  gabapentin (NEURONTIN) 300 MG capsule Take 1 capsule (300 mg total) by mouth at bedtime. Patient taking differently: Take 300 mg by mouth at bedtime. Takes once daily at bedtime as needed for leg pain. 10/21/13  Yes Marianne L York, PA-C  glimepiride (AMARYL) 4 MG tablet Take 4 mg by mouth 2 (two) times daily. 04/15/14  Yes Historical Provider, MD  insulin detemir (LEVEMIR) 100 UNIT/ML injection Inject 0.12 mLs (12 Units total) into the skin at bedtime. Patient taking differently: Inject 12 Units into the skin at bedtime. At bedtime (varies from 7-11 pm ) 10/21/13  Yes Bobby Rumpf York, PA-C  Insulin Pen Needle (SURE COMFORT PEN NEEDLES) 30G X 8 MM MISC Use as directed. Patient uses TFTD DUKG 25  g /8 mm needles 01/28/14  Yes Historical Provider, MD  lovastatin (MEVACOR) 20 MG tablet Take 20 mg by mouth at bedtime.   Yes Historical Provider, MD  ranitidine (ZANTAC) 300 MG tablet Take 300 mg by mouth at bedtime.   Yes Historical Provider, MD  ursodiol (ACTIGALL) 300 MG capsule Take 300 mg by mouth 3 (three) times daily.   Yes Historical Provider, MD  Cyanocobalamin (VITAMIN B 12) 100 MCG LOZG Take 1 tablet by mouth.    Historical Provider, MD  insulin aspart (NOVOLOG) 100 UNIT/ML injection Inject 3 Units into the skin 3 (three) times daily with meals. Patient not taking: Reported on 11/05/2014 10/21/13   Bobby Rumpf York,  PA-C  ondansetron (ZOFRAN) 4 MG tablet Take 1 tablet (4 mg total) by mouth every 6 (six) hours as needed for nausea. Patient not taking: Reported on 11/05/2014 09/20/14   Venetia Maxon Rama, MD  oxyCODONE (OXY IR/ROXICODONE) 5 MG immediate release tablet Take 1 tablet (5 mg total) by mouth every 4 (four) hours as needed for severe pain. Patient not taking: Reported on 11/05/2014 09/20/14   Venetia Maxon Rama, MD  polyethylene glycol (MIRALAX / GLYCOLAX) packet Take 17 g by mouth daily. Patient not taking: Reported on 11/05/2014 10/21/13   Bobby Rumpf York, PA-C   BP 177/68 mmHg  Pulse 72  Temp(Src) 98 F (36.7 C) (Oral)  Resp 18  Ht 5\' 6"  (1.676 m)  Wt 210 lb (95.255 kg)  BMI 33.91 kg/m2  SpO2 94% Physical Exam  Constitutional: She is oriented to person, place, and time. She appears well-developed and well-nourished.  HENT:  Head: Normocephalic and atraumatic.  Right Ear: External ear normal.  Left Ear: External ear normal.  Eyes: Conjunctivae and EOM are normal. Pupils are equal, round, and reactive to light.  Neck: Normal range of motion and phonation normal. Neck supple.  Cardiovascular: Normal rate, regular rhythm and normal heart sounds.   Pulmonary/Chest: Effort normal and breath sounds normal. She exhibits no bony tenderness.  Abdominal: Soft. There is no tenderness.  Musculoskeletal: Normal range of motion.  Unna boots on bilateral LE and feet. Moderate leg edema bilateral with bilateral redness-mild.  Neurological: She is alert and oriented to person, place, and time. No cranial nerve deficit or sensory deficit. She exhibits normal muscle tone. Coordination normal.  Skin: Skin is warm, dry and intact.  Psychiatric: She has a normal mood and affect. Her behavior is normal.  Nursing note and vitals reviewed.   ED Course  Procedures (including critical care time)  Medications - No data to display  Patient Vitals for the past 24 hrs:  BP Temp Temp src Pulse Resp SpO2 Height Weight   11/05/14 1958 177/68 mmHg - - 72 18 94 % - -  11/05/14 1830 179/78 mmHg - - 73 18 94 % - -  11/05/14 1800 150/64 mmHg - - 75 18 (!) 89 % - -  11/05/14 1745 - - - 74 - (!) 89 % - -  11/05/14 1730 147/66 mmHg - - 73 - (!) 88 % - -  11/05/14 1702 174/67 mmHg 98 F (36.7 C) Oral 75 18 93 % 5\' 6"  (1.676 m) 210 lb (95.255 kg)    21:15- Reevaluation with update and discussion. After initial assessment and treatment, an updated evaluation reveals no change in clinical status. Patient's assisted living facility was contacted, to discuss the treatment plan. Willim Turnage L      Labs Review Labs Reviewed  COMPREHENSIVE METABOLIC PANEL -  Abnormal; Notable for the following:    Potassium 3.2 (*)    Glucose, Bld 129 (*)    Creatinine, Ser 1.52 (*)    Calcium 8.5 (*)    Albumin 2.4 (*)    Alkaline Phosphatase 187 (*)    Total Bilirubin 1.3 (*)    GFR calc non Af Amer 32 (*)    GFR calc Af Amer 37 (*)    All other components within normal limits  CBC WITH DIFFERENTIAL/PLATELET - Abnormal; Notable for the following:    RBC 3.05 (*)    Hemoglobin 10.2 (*)    HCT 31.2 (*)    MCV 102.3 (*)    Eosinophils Relative 7 (*)    All other components within normal limits  URINALYSIS, ROUTINE W REFLEX MICROSCOPIC (NOT AT [facility]) - Abnormal; Notable for the following:    Hgb urine dipstick MODERATE (*)    All other components within normal limits  URINE MICROSCOPIC-ADD ON - Abnormal; Notable for the following:    Squamous Epithelial / LPF FEW (*)    All other components within normal limits  URINE CULTURE  I-STAT CG4 LACTIC ACID, ED    Imaging Review Ct Head Wo Contrast  11/05/2014   CLINICAL DATA:  Delusions.  Hallucinations.  No pain.  EXAM: CT HEAD WITHOUT CONTRAST  TECHNIQUE: Contiguous axial images were obtained from the base of the skull through the vertex without intravenous contrast.  COMPARISON:  None.  FINDINGS: There is no evidence of mass effect, midline shift, or extra-axial fluid  collections. There is no evidence of a space-occupying lesion or intracranial hemorrhage. There is no evidence of a cortical-based area of acute infarction. There is generalized cerebral atrophy. There is periventricular white matter low attenuation likely secondary to microangiopathy.  The ventricles and sulci are appropriate for the patient's age. The basal cisterns are patent.  Visualized portions of the orbits are unremarkable. The visualized portions of the paranasal sinuses and mastoid air cells are unremarkable. Cerebrovascular atherosclerotic calcifications are noted.  The osseous structures are unremarkable.  IMPRESSION: 1. No acute intracranial pathology. 2. Chronic microvascular disease and cerebral atrophy.   Electronically Signed   By: Kathreen Devoid   On: 11/05/2014 18:54   I have personally reviewed and evaluated these images and lab results as part of my medical decision-making.   EKG Interpretation None      MDM   Final diagnoses:  Delusion    Evaluation consistent with delusions, unspecified, likely related to dementia. No clear evidence for some delirium on screening medical evaluation. Patient is stable for discharge with outpatient follow-up with PCP, and we will arrange home health services to see the patient tomorrow to ensure that she is safe in her current home setting.  Nursing Notes Reviewed/ Care Coordinated Applicable Imaging Reviewed Interpretation of Laboratory Data incorporated into ED treatment  The patient appears reasonably screened and/or stabilized for discharge and I doubt any other medical condition or other [facility] requiring further screening, evaluation, or treatment in the ED at this time prior to discharge.  Plan: Home Medications- usual; Home Treatments- rest; return here if the recommended treatment, does not improve the symptoms; Recommended follow up- PCP 1 week. She will need Psychiatry f/u as an OP.     Daleen Bo, MD 11/05/14 2119

## 2014-11-05 NOTE — ED Notes (Signed)
Unable to collect labs patient is not in room 

## 2014-11-05 NOTE — ED Notes (Signed)
Pt explained to myself that this incident occurred yesterday. Pt denies any hallucinations prior to yesterday. Pt states that she would see the humans and then they would turn into a leaf. She also states that while in communication with one of these people that they stated to her that they would turn into a dog when they left. Pt denies these hallucinations to telling her to do anything harmful. Pt has noted dressings on her legs and they are a 3+ pitting edema as well.

## 2014-11-05 NOTE — Discharge Instructions (Signed)
We are going to have home health services come to your appointment, to help you with arranging follow-up care.  When you see your doctor, ask him to refer you to a psychiatrist.

## 2014-11-05 NOTE — ED Notes (Signed)
Pt in CT, will attempt draw upon return

## 2014-11-07 LAB — URINE CULTURE: Special Requests: NORMAL

## 2014-12-25 ENCOUNTER — Encounter (HOSPITAL_COMMUNITY): Payer: Self-pay | Admitting: Emergency Medicine

## 2014-12-25 ENCOUNTER — Other Ambulatory Visit: Payer: Self-pay

## 2014-12-25 ENCOUNTER — Emergency Department (HOSPITAL_COMMUNITY): Payer: Medicare HMO

## 2014-12-25 ENCOUNTER — Emergency Department (HOSPITAL_COMMUNITY)
Admission: EM | Admit: 2014-12-25 | Discharge: 2014-12-25 | Disposition: A | Discharge status: Home / Self Care | Payer: Medicare HMO | Attending: Emergency Medicine | Admitting: Emergency Medicine

## 2014-12-25 DIAGNOSIS — Z794 Long term (current) use of insulin: Secondary | ICD-10-CM | POA: Diagnosis not present

## 2014-12-25 DIAGNOSIS — Z79899 Other long term (current) drug therapy: Secondary | ICD-10-CM | POA: Insufficient documentation

## 2014-12-25 DIAGNOSIS — E119 Type 2 diabetes mellitus without complications: Secondary | ICD-10-CM | POA: Diagnosis not present

## 2014-12-25 DIAGNOSIS — K469 Unspecified abdominal hernia without obstruction or gangrene: Secondary | ICD-10-CM

## 2014-12-25 DIAGNOSIS — I10 Essential (primary) hypertension: Secondary | ICD-10-CM | POA: Diagnosis not present

## 2014-12-25 DIAGNOSIS — Z720 Tobacco use: Secondary | ICD-10-CM | POA: Insufficient documentation

## 2014-12-25 DIAGNOSIS — Z7982 Long term (current) use of aspirin: Secondary | ICD-10-CM | POA: Diagnosis not present

## 2014-12-25 DIAGNOSIS — K429 Umbilical hernia without obstruction or gangrene: Secondary | ICD-10-CM | POA: Insufficient documentation

## 2014-12-25 LAB — CBC WITH DIFFERENTIAL/PLATELET
Basophils Absolute: 0 10*3/uL (ref 0.0–0.1)
Basophils Relative: 1 %
EOS PCT: 7 %
Eosinophils Absolute: 0.4 10*3/uL (ref 0.0–0.7)
HCT: 33.8 % — ABNORMAL LOW (ref 36.0–46.0)
Hemoglobin: 11.1 g/dL — ABNORMAL LOW (ref 12.0–15.0)
LYMPHS ABS: 1.8 10*3/uL (ref 0.7–4.0)
LYMPHS PCT: 30 %
MCH: 32.7 pg (ref 26.0–34.0)
MCHC: 32.8 g/dL (ref 30.0–36.0)
MCV: 99.7 fL (ref 78.0–100.0)
MONO ABS: 0.6 10*3/uL (ref 0.1–1.0)
MONOS PCT: 10 %
Neutro Abs: 3 10*3/uL (ref 1.7–7.7)
Neutrophils Relative %: 52 %
PLATELETS: 213 10*3/uL (ref 150–400)
RBC: 3.39 MIL/uL — ABNORMAL LOW (ref 3.87–5.11)
RDW: 17.5 % — AB (ref 11.5–15.5)
WBC: 5.9 10*3/uL (ref 4.0–10.5)

## 2014-12-25 LAB — COMPREHENSIVE METABOLIC PANEL
ALT: 12 U/L — AB (ref 14–54)
AST: 30 U/L (ref 15–41)
Albumin: 2.4 g/dL — ABNORMAL LOW (ref 3.5–5.0)
Alkaline Phosphatase: 169 U/L — ABNORMAL HIGH (ref 38–126)
Anion gap: 3 — ABNORMAL LOW (ref 5–15)
BUN: 17 mg/dL (ref 6–20)
CHLORIDE: 108 mmol/L (ref 101–111)
CO2: 32 mmol/L (ref 22–32)
CREATININE: 1.43 mg/dL — AB (ref 0.44–1.00)
Calcium: 8.6 mg/dL — ABNORMAL LOW (ref 8.9–10.3)
GFR, EST AFRICAN AMERICAN: 39 mL/min — AB (ref >60)
GFR, EST NON AFRICAN AMERICAN: 34 mL/min — AB (ref >60)
Glucose, Bld: 91 mg/dL (ref 65–99)
POTASSIUM: 3.8 mmol/L (ref 3.5–5.1)
Sodium: 143 mmol/L (ref 135–145)
TOTAL PROTEIN: 7.6 g/dL (ref 6.5–8.1)
Total Bilirubin: 2 mg/dL — ABNORMAL HIGH (ref 0.3–1.2)

## 2014-12-25 LAB — I-STAT CG4 LACTIC ACID, ED: Lactic Acid, Venous: 1.15 mmol/L (ref 0.5–2.0)

## 2014-12-25 MED ORDER — MORPHINE SULFATE (PF) 4 MG/ML IV SOLN
4.0000 mg | Freq: Once | INTRAVENOUS | Status: AC
  Administered 2014-12-25: 4 mg via INTRAVENOUS
  Filled 2014-12-25: qty 1

## 2014-12-25 NOTE — Discharge Instructions (Signed)
1. Medications: usual home medications 2. Treatment: rest, drink plenty of fluids 3. Follow Up: please follow-up with gastroenterology for liver cirrhosis/ascites, please follow-up with general surgery for umbilical and supraumbilical hernia 4. Please return to the ER for severe abdominal pain, high fever, persistent vomiting, constipation, new or worsening symptoms   Hernia, Adult A hernia is the bulging of an organ or tissue through a weak spot in the muscles of the abdomen (abdominal wall). Hernias develop most often near the navel or groin. There are many kinds of hernias. Common kinds include:  Femoral hernia. This kind of hernia develops under the groin in the upper thigh area.  Inguinal hernia. This kind of hernia develops in the groin or scrotum.  Umbilical hernia. This kind of hernia develops near the navel.  Hiatal hernia. This kind of hernia causes part of the stomach to be pushed up into the chest.  Incisional hernia. This kind of hernia bulges through a scar from an abdominal surgery. CAUSES This condition may be caused by:  Heavy lifting.  Coughing over a long period of time.  Straining to have a bowel movement.  An incision made during an abdominal surgery.  A birth defect (congenital defect).  Excess weight or obesity.  Smoking.  Poor nutrition.  Cystic fibrosis.  Excess fluid in the abdomen.  Undescended testicles. SYMPTOMS Symptoms of a hernia include:  A lump on the abdomen. This is the first sign of a hernia. The lump may become more obvious with standing, straining, or coughing. It may get bigger over time if it is not treated or if the condition causing it is not treated.  Pain. A hernia is usually painless, but it may become painful over time if treatment is delayed. The pain is usually dull and may get worse with standing or lifting heavy objects. Sometimes a hernia gets tightly squeezed in the weak spot (strangulated) or stuck there  (incarcerated) and causes additional symptoms. These symptoms may include:  Vomiting.  Nausea.  Constipation.  Irritability. DIAGNOSIS A hernia may be diagnosed with:  A physical exam. During the exam your health care provider may ask you to cough or to make a specific movement, because a hernia is usually more visible when you move.  Imaging tests. These can include:  X-rays.  Ultrasound.  CT scan. TREATMENT A hernia that is small and painless may not need to be treated. A hernia that is large or painful may be treated with surgery. Inguinal hernias may be treated with surgery to prevent incarceration or strangulation. Strangulated hernias are always treated with surgery, because lack of blood to the trapped organ or tissue can cause it to die. Surgery to treat a hernia involves pushing the bulge back into place and repairing the weak part of the abdomen. HOME CARE INSTRUCTIONS  Avoid straining.  Do not lift anything heavier than 10 lb (4.5 kg).  Lift with your leg muscles, not your back muscles. This helps avoid strain.  When coughing, try to cough gently.  Prevent constipation. Constipation leads to straining with bowel movements, which can make a hernia worse or cause a hernia repair to break down. You can prevent constipation by:  Eating a high-fiber diet that includes plenty of fruits and vegetables.  Drinking enough fluids to keep your urine clear or pale yellow. Aim to drink 6-8 glasses of water per day.  Using a stool softener as directed by your health care provider.  Lose weight, if you are overweight.  Do not  use any tobacco products, including cigarettes, chewing tobacco, or electronic cigarettes. If you need help quitting, ask your health care provider.  Keep all follow-up visits as directed by your health care provider. This is important. Your health care provider may need to monitor your condition. SEEK MEDICAL CARE IF:  You have swelling, redness, and  pain in the affected area.  Your bowel habits change. SEEK IMMEDIATE MEDICAL CARE IF:  You have a fever.  You have abdominal pain that is getting worse.  You feel nauseous or you vomit.  You cannot push the hernia back in place by gently pressing on it while you are lying down.  The hernia:  Changes in shape or size.  Is stuck outside the abdomen.  Becomes discolored.  Feels hard or tender.   This information is not intended to replace advice given to you by your health care provider. Make sure you discuss any questions you have with your health care provider.   Document Released: 02/21/2005 Document Revised: 03/14/2014 Document Reviewed: 01/01/2014 Elsevier Interactive Patient Education 2016 [employer].   Emergency Department Resource Guide 1) Find a Doctor and Pay Out of Pocket Although you won't have to find out who is covered by your insurance plan, it is a good idea to ask around and get recommendations. You will then need to call the office and see if the doctor you have chosen will accept you as a new patient and what types of options they offer for patients who are self-pay. Some doctors offer discounts or will set up payment plans for their patients who do not have insurance, but you will need to ask so you aren't surprised when you get to your appointment.  2) Contact Your Local Health Department Not all health departments have doctors that can see patients for sick visits, but many do, so it is worth a call to see if yours does. If you don't know where your local health department is, you can check in your phone book. The CDC also has a tool to help you locate your state's health department, and many state websites also have listings of all of their local health departments.  3) Find a [facility] If your illness is not likely to be very severe or complicated, you may want to try a walk in clinic. These are popping up all over the country in pharmacies,  drugstores, and shopping centers. They're usually staffed by nurse practitioners or physician assistants that have been trained to treat common illnesses and complaints. They're usually fairly quick and inexpensive. However, if you have serious medical issues or chronic medical problems, these are probably not your best option.  No Primary Care Doctor: - Call Health Connect at  [phone] - they can help you locate a primary care doctor that  accepts your insurance, provides certain services, etc. - Physician Referral Service- [phone]  Chronic Pain Problems: Organization         Address  Phone   Notes  [facility]  [phone] Patients need to be referred by their primary care doctor.   Medication Assistance: Organization         Address  Phone   Notes  [facility] Medication [facility] [address] [place], [place] [address] [phone] --Must be a resident of [facility] -- Must have NO insurance coverage whatsoever (no Medicaid/ Medicare, etc.) -- The pt. MUST have a primary care doctor that directs their care regularly and  follows them in the community   Med[facility]ist  [phone]   [employer]  [phone]    Agencies that provide inexpensive medical care: Organization         Address  Phone   Notes  [place]  [phone]   [facility]    [phone]   [facility] [place], [place] [address] [phone]   [place]. [address] [place] [phone]   Planned Parenthood    [phone]   [facility]    [phone]   [place] and [address], [place] Phone:  [phone], Fax:  [phone] Hours of Operation:  9 am - 6 pm, M-F.  Also accepts Medicaid/Medicare and self-pay.  [facility] for [place], [address], [place] Phone:  [phone], Fax: [phone]. Hours of Operation:  8:30 am - 5:30 pm, M-F.  Also accepts Medicaid and self-pay.  [facility] [place] [address] [place] Phone: [phone]   [place], [place], [place] [phone], Ext. [phone] Mondays & Thursdays: 7-9 AM.  First 15 patients are seen on a first come, first serve basis.    [place] Providers:  Organization         Address  Phone   Notes  [facility] [address], [place] [phone] Also accepts self-pay patients.  [facility] [address], [place]  [phone]   [place], [address], [place] [phone]   [facility] [address], [place] [phone]   Lucianne Lei [address], [place]   [phone] Only accepts [place] Access [place] patients after they have their name applied to their card.   Self-Pay (no insurance) in [facility]:  Organization         Address  Phone   Notes  Sickle Cell Patients, [facility] Internal [facility] [place] [phone]   [facility] Care [place] [phone]   [facility] [place]  [place], [place], [place] [phone]   [facility]/Dr. Osei-Bonsu  [address] [place] or [address], [place] [phone] Phone number for both [place] and [place] locations is the same.  Urgent Medical and [facility] [place] [address], [place] [phone]   [facility] [address], [place] or [address] [phone]   [facility] [address], [place] [phone], phone; [phone], fax Sees patients 1st and 3rd Saturday of every month.  Must not qualify for public  or private insurance (i.e. Medicaid, Medicare, [place] Health Choice, Veterans' Benefits)  Household income should be no more than 200% of the poverty level The clinic cannot treat you if you are pregnant or think you are pregnant  Sexually transmitted diseases are not treated at the clinic.    Dental Care: Organization         Address  Phone  Notes  [facility] Department of [facility] [address] [place], [place] [phone] Accepts children up to age 38 who are  enrolled in Medicaid or [place] Health Choice; pregnant women with a Medicaid card; and children who have applied for Medicaid or [place] Health Choice, but were declined, whose parents can pay a reduced fee at time of service.  [facility]  [address], [place] [phone] Accepts children up to age 32 who are enrolled in [place] or [place]; pregnant women with a Medicaid card; and children who have applied for Medicaid or [place] Health Choice, but were declined, whose parents can pay a reduced fee at time of service.  [facility] Adult [facility] Access PROGRAM  [place] [phone] Patients are seen by appointment only. Walk-ins are not accepted. [place] will see patients 40 years of age and older. Monday - Tuesday (8am-5pm) Most Wednesdays (8:30-5pm) $30 per visit, cash only  [facility] Adult Dental Access PROGRAM  [address], [facility] [phone] Patients are seen by appointment only. Walk-ins are not accepted. [place] will see patients 20 years of age and older. One Wednesday Evening (Monthly: Volunteer Based).  $30 per visit, cash only  [place]  [phone] for adults; Children under age 31, call Graduate Pediatric Dentistry at [phone]. Children aged 3-14, please call [phone] to request a pediatric application.  Dental services are provided in all areas of  dental care including fillings, crowns and bridges, complete and partial dentures, implants, gum treatment, root canals, and extractions. Preventive care is also provided. Treatment is provided to both adults and children. Patients are selected via a lottery and there is often a waiting list.   [facility] [place] [address] [place]  [phone] [email or web address]   [facility] [address] [place], [place] [phone], Ext. [phone] Second and Fourth Thursday of each month, opens at 6:30 AM; Clinic ends at 9 AM.  Patients are seen on a first-come first-served basis, and a limited number are seen during each clinic.   [facility]  [address] Hillard Danker [place], [place] [phone]   Eligibility Requirements You must have lived in [place], [place], or [place] counties for at least the last three months.   You cannot be eligible for state or federal sponsored [employer], including [employer], [place], or [employer].   You generally cannot be eligible for healthcare insurance through your employer.    How to apply: Eligibility screenings are held every Tuesday and Wednesday afternoon from 1:00 pm until 4:00 pm. You do not need an appointment for the interview!  [facility] [address] [place], [place]   [place]  [place] Department  [place]  [phone]    Behavioral Health Resources in the Community: Intensive Outpatient Programs Organization         Address  Phone  Notes  [place]. [address], [place], [place] [phone]   [facility] Outpatient [address], [place], [place]   ADS: Alcohol & Drug Svcs [address] [place], [place]   [place] [address]  [place], [place] or  [phone]   Substance Abuse Resources Organization         Address  Phone  Notes  Alcohol and Drug Services  [place]  [phone]  The [place]   Chinita Pester  [phone]   Residential & Outpatient Substance Abuse Program  [phone]   Psychological Services Organization         Address  Phone  Notes  [facility] [place]  [place]  [phone]   [place] [address], [place] or [phone]    Mobile Crisis Teams Organization         Address  Phone  Notes  [facility], Mobile Crisis Care Unit  [phone]   [facility]ive Psychotherapeutic Services  [address] [place], [place]   [place] [address], [place] [phone]    Self-Help/Support Groups Organization         Address  Phone             Notes  [place]. of [place] - variety of support groups  [place] Call for more information  Narcotics Anonymous (NA), [facility]aring Services [address], [employer] [place]  2 meetings at this location   Special educational needs teacher         Address  Phone  Notes  [facility]idential Treatment [place],    [place]  [phone]   [facility]  [address], [place] [address]9024, [place], [place]   [place], [place] [phone] Admissions: 8am-3pm M-F  Incentives Substance [place] [address]    [place], [place] [phone]   [facility] [address] [place], [place], [place]   [facility] [address]  [place], [place]   Insight Programs - Intensive Outpatient [address] Kristeen [address], [place], [place]   [facility] ([place].) [place].,  [place], [place] [phone] or [phone]   Residential  Treatment Services (RTS) [address], Red Cross, [place] Accepts Medicaid  [facility] [place] [address]  [place] [phone] Substance Abuse/Addiction Treatment   [facility] [place] Organization         Address  Phone  Notes  [facility]  [phone]   Domenic Schwab, PhD [address] Arlis Porta [place], [place]   [phone] or [phone]   [place], [place] [phone]   [facility] [address] [place], [place] [phone] Insurance/Medicaid/sponsorship through [facility] and Families [address], [address]                                    [place], [place] [phone] [place] [address], [place] [phone]    Dr. Adele Schilder  [phone]   [facility] [place] Dept. 1) [address] [place] 2) [place] 3)  [address], [place] [phone]  [phone]   [place] [phone] or [phone] (After Hours)

## 2014-12-25 NOTE — ED Notes (Signed)
Per [facility] pt from facility , unable to provide the name from EMS yet we have an address documented . Pt co abd pain thatt pt believes that is cused by pre-existant hernia. Pt wishes to be evaluated for hernia since it never hurt before. Pt alert and oriented x 4. Pt denies NVD, nor further complaints.

## 2014-12-25 NOTE — ED Provider Notes (Signed)
CSN: [id number]     Arrival date & time 12/25/14  1340 History   First MD Initiated Contact with Patient 12/25/14 1503     Chief Complaint  Patient presents with  . Hernia    HPI   Nohelani Benning is a 81 y.o. female with a PMH of HTN and DM who presents to the ED with abdominal hernia. She states she was bending over to reach for something this morning when her pain started, which she attributes to her hernia. She reports her pain has been constant since that time. She states she has an abdominal hernia at baseline, but that she has never experienced pain with it. She denies exacerbating factors, though reports her hernia becomes larger when she sits up. She has not tried anything for symptom relief. She denies fever, chills, HA, lightheadedness, dizziness, shortness of breath, chest pain, N/V/D/C.    Past Medical History  Diagnosis Date  . Back pain   . Hypertension   . Carotid stenosis   . Colitis   . Diabetes mellitus without complication [facility])    Past Surgical History  Procedure Laterality Date  . Carotid endarterectomy    . Lumbar laminectomy for epidural abscess N/A 10/16/2013    Procedure: LUMBAR LAMINECTOMY FOR EPIDURAL ABSCESS Lumbar Two through Four;  Surgeon: Charlie Pitter, MD;  Location: [facility];  Service: Neurosurgery;  Laterality: N/A;   Family History  Problem Relation Age of Onset  . Leukemia Mother   . Jaundice Father    Social History  Substance Use Topics  . Smoking status: Current Every Day Smoker -- 0.20 packs/day  . Smokeless tobacco: None  . Alcohol Use: No   OB History    No data available      Review of Systems  Constitutional: Negative for fever and chills.  Respiratory: Negative for shortness of breath.   Cardiovascular: Negative for chest pain.  Gastrointestinal: Positive for abdominal pain. Negative for nausea, vomiting, diarrhea and constipation.  Genitourinary: Negative for dysuria, urgency and frequency.  Neurological: Negative for  dizziness, light-headedness and headaches.  All other systems reviewed and are negative.     Allergies  Peanut-containing drug products  Home Medications   Prior to Admission medications   Medication Sig Start Date End Date Taking? Authorizing Provider  acetaminophen (TYLENOL) 500 MG tablet Take 500 mg by mouth every 6 (six) hours as needed for moderate pain.    Yes Historical Provider, MD  aspirin (ASPIRIN EC) 81 MG EC tablet Take 162 mg by mouth at bedtime. Swallow whole.   Yes Historical Provider, MD  atenolol (TENORMIN) 100 MG tablet Take 150 mg by mouth daily.  07/10/14  Yes Historical Provider, MD  buPROPion (WELLBUTRIN XL) 300 MG 24 hr tablet Take 300 mg by mouth daily.   Yes Historical Provider, MD  cholecalciferol (VITAMIN D) 1000 UNITS tablet Take 2,000 Units by mouth daily.    Yes Historical Provider, MD  Cyanocobalamin (VITAMIN B 12) 100 MCG LOZG Take 1 tablet by mouth every other day.    Yes Historical Provider, MD  gabapentin (NEURONTIN) 300 MG capsule Take 1 capsule (300 mg total) by mouth at bedtime. 10/21/13  Yes Marianne L York, PA-C  glimepiride (AMARYL) 4 MG tablet Take 4 mg by mouth 2 (two) times daily. 04/15/14  Yes Historical Provider, MD  insulin detemir (LEVEMIR) 100 UNIT/ML injection Inject 0.12 mLs (12 Units total) into the skin at bedtime. 10/21/13  Yes Marianne L York, PA-C  lovastatin (MEVACOR) 20  MG tablet Take 20 mg by mouth at bedtime.   Yes Historical Provider, MD  oxyCODONE (OXY IR/ROXICODONE) 5 MG immediate release tablet Take 1 tablet (5 mg total) by mouth every 4 (four) hours as needed for severe pain. 09/20/14  Yes Christina P Rama, MD  ranitidine (ZANTAC) 300 MG tablet Take 300 mg by mouth at bedtime.   Yes Historical Provider, MD  ursodiol (ACTIGALL) 30 mg/mL oral suspension Take 240-360 mg by mouth daily.   Yes Historical Provider, MD  insulin aspart (NOVOLOG) 100 UNIT/ML injection Inject 3 Units into the skin 3 (three) times daily with meals. Patient not  taking: Reported on 11/05/2014 10/21/13   Bobby Rumpf York, PA-C  ondansetron (ZOFRAN) 4 MG tablet Take 1 tablet (4 mg total) by mouth every 6 (six) hours as needed for nausea. Patient not taking: Reported on 11/05/2014 09/20/14   Venetia Maxon Rama, MD  polyethylene glycol (MIRALAX / GLYCOLAX) packet Take 17 g by mouth daily. Patient not taking: Reported on 11/05/2014 10/21/13   Bobby Rumpf York, PA-C    BP 173/58 mmHg  Pulse 65  Temp(Src) 97.3 F (36.3 C) (Oral)  Resp 16  SpO2 96% Physical Exam  Constitutional: She is oriented to person, place, and time. She appears well-developed and well-nourished. No distress.  HENT:  Head: Normocephalic and atraumatic.  Right Ear: External ear normal.  Left Ear: External ear normal.  Nose: Nose normal.  Mouth/Throat: Uvula is midline, oropharynx is clear and moist and mucous membranes are normal.  Eyes: Conjunctivae, EOM and lids are normal. Pupils are equal, round, and reactive to light. Right eye exhibits no discharge. Left eye exhibits no discharge. No scleral icterus.  Neck: Normal range of motion. Neck supple.  Cardiovascular: Normal rate, regular rhythm, normal heart sounds, intact distal pulses and normal pulses.   Pulmonary/Chest: Effort normal and breath sounds normal. No respiratory distress.  Abdominal: Soft. Normal appearance and bowel sounds are normal. She exhibits mass. She exhibits no distension. There is tenderness. There is no rigidity, no rebound and no guarding.  TTP over umbilical hernia.  Musculoskeletal: Normal range of motion. She exhibits no edema or tenderness.  Neurological: She is alert and oriented to person, place, and time. She has normal strength. No sensory deficit.  Skin: Skin is warm, dry and intact. No rash noted. She is not diaphoretic. No erythema. No pallor.  Psychiatric: She has a normal mood and affect. Her speech is normal and behavior is normal.  Nursing note and vitals reviewed.   ED Course  Procedures  (including critical care time)  Labs Review Labs Reviewed  CBC WITH DIFFERENTIAL/PLATELET - Abnormal; Notable for the following:    RBC 3.39 (*)    Hemoglobin 11.1 (*)    HCT 33.8 (*)    RDW 17.5 (*)    All other components within normal limits  COMPREHENSIVE METABOLIC PANEL - Abnormal; Notable for the following:    Creatinine, Ser 1.43 (*)    Calcium 8.6 (*)    Albumin 2.4 (*)    ALT 12 (*)    Alkaline Phosphatase 169 (*)    Total Bilirubin 2.0 (*)    GFR calc non Af Amer 34 (*)    GFR calc Af Amer 39 (*)    Anion gap 3 (*)    All other components within normal limits  I-STAT CG4 LACTIC ACID, ED    Imaging Review Ct Abdomen Pelvis Wo Contrast  12/25/2014  CLINICAL DATA:  Generalized abdominal pain EXAM: CT  ABDOMEN AND PELVIS WITHOUT CONTRAST TECHNIQUE: Multidetector CT imaging of the abdomen and pelvis was performed following the standard protocol without IV contrast. COMPARISON:  10/12/2013 FINDINGS: Lower chest and abdominal wall: Small bilateral pleural effusion which is new. Chronic umbilical and supraumbilical hernias containing fat and now with ascitic fluid. Hepatobiliary: Cirrhotic liver morphology without gross mass lesion. There is new moderate ascites. No splenomegaly.Partial but diffuse mural calcification of the gallbladder wall without typical layering stone. No gallbladder inflammation or over distension. Pancreas: Unremarkable. Spleen: Unremarkable. Adrenals/Urinary Tract: Stable low-density lobulation of the adrenals, left more than right, consistent with adenomas. Left lower pole 3 mm calculus. No hydronephrosis. Unremarkable bladder. Reproductive:No pathologic findings. Stomach/Bowel:  No obstruction. No appendicitis. Vascular/Lymphatic: No acute vascular abnormality. Enlarged deep liver drainage lymph nodes, nonspecific and usually reactive in the setting of cirrhosis. Musculoskeletal: Severe and diffuse disc degeneration and facet arthropathy with lumbar  levoscoliosis. Multilevel lumbar laminectomy. No acute osseous finding. IMPRESSION: 1. Cirrhosis with moderate ascites. Ascites has developed since 2015. 2. The questioned umbilical and supraumbilical hernias are chronic and stable other than new ascitic fluid. 3. Small pleural effusions. 4. Porcelain gallbladder. 5. Left nephrolithiasis. Electronically Signed   By: Monte Fantasia M.D.   On: 12/25/2014 17:15     I have personally reviewed and evaluated these images and lab results as part of my medical decision-making.   EKG Interpretation None      MDM   Final diagnoses:  Abdominal hernia    81 year old female presents with abdominal hernia, which she states she has at baseline, but caused her pain throughout the day today. Denies fever, chills, HA, lightheadedness, dizziness, shortness of breath, chest pain, N/V/D/C.   Patient is afebrile. Vital signs stable. Heart RRR. Lungs clear to auscultation bilaterally. Abdomen with TTP over umbilical hernia. No rebound or guarding. Attempted to reduce hernia, however unable to do so.  Patient given pain medication in the ED. On reassessment of patient, patient reports symptom resolution. On exam, appears hernia spontaneously reduced.  CBC negative for leukocytosis, hemoglobin 11. CMP with elevated creatinine, alk phos, and bilirubin, which appears chronic. Lactic acid within normal limits. CT abdomen pelvis reveals cirrhosis with moderate ascites, umbilical and supraumbilical hernias which appear chronic and stable, porcelain gallbladder, left nephrolithiasis.  Spoke with patient regarding findings. She is well-appearing and has no complaints. Feel she is stable for discharge at this time. Patient discussed with and seen by Dr. Alfonse Spruce. Patient to follow-up with GI for further evaluation and management of cirrhosis and ascites and with general surgery for further evaluation and management of umbilical and supraumbilical hernias. Return precautions  discussed at length.   BP 173/58 mmHg  Pulse 65  Temp(Src) 97.3 F (36.3 C) (Oral)  Resp 16  SpO2 96%     Marella Chimes, PA-C 12/26/14 [place], MD 12/30/14 [phone]

## 2014-12-25 NOTE — ED Notes (Signed)
Bed: [facility] Expected date:  Expected time:  Means of arrival:  Comments: Ems- abd pain

## 2014-12-25 NOTE — ED Notes (Addendum)
Bed: [facility] Expected date:  Expected time:  Means of arrival:  Comments: [facility] 

## 2014-12-25 NOTE — ED Notes (Signed)
PA at bedside.

## 2014-12-26 ENCOUNTER — Encounter (HOSPITAL_COMMUNITY): Payer: Self-pay

## 2014-12-26 ENCOUNTER — Observation Stay (HOSPITAL_COMMUNITY)
Admission: EM | Admit: 2014-12-26 | Discharge: 2014-12-30 | Disposition: A | Discharge status: Skilled Nursing Facility | Payer: Medicare HMO | Attending: Internal Medicine | Admitting: Internal Medicine

## 2014-12-26 ENCOUNTER — Emergency Department (HOSPITAL_COMMUNITY): Payer: Medicare HMO

## 2014-12-26 DIAGNOSIS — I5020 Unspecified systolic (congestive) heart failure: Secondary | ICD-10-CM | POA: Insufficient documentation

## 2014-12-26 DIAGNOSIS — R609 Edema, unspecified: Secondary | ICD-10-CM | POA: Insufficient documentation

## 2014-12-26 DIAGNOSIS — N183 Chronic kidney disease, stage 3 unspecified: Secondary | ICD-10-CM | POA: Diagnosis present

## 2014-12-26 DIAGNOSIS — N390 Urinary tract infection, site not specified: Secondary | ICD-10-CM | POA: Insufficient documentation

## 2014-12-26 DIAGNOSIS — R188 Other ascites: Secondary | ICD-10-CM | POA: Diagnosis not present

## 2014-12-26 DIAGNOSIS — R05 Cough: Secondary | ICD-10-CM

## 2014-12-26 DIAGNOSIS — K7290 Hepatic failure, unspecified without coma: Secondary | ICD-10-CM | POA: Insufficient documentation

## 2014-12-26 DIAGNOSIS — Z9181 History of falling: Secondary | ICD-10-CM | POA: Diagnosis not present

## 2014-12-26 DIAGNOSIS — Z79899 Other long term (current) drug therapy: Secondary | ICD-10-CM | POA: Insufficient documentation

## 2014-12-26 DIAGNOSIS — J4530 Mild persistent asthma, uncomplicated: Secondary | ICD-10-CM

## 2014-12-26 DIAGNOSIS — E1165 Type 2 diabetes mellitus with hyperglycemia: Secondary | ICD-10-CM

## 2014-12-26 DIAGNOSIS — F17200 Nicotine dependence, unspecified, uncomplicated: Secondary | ICD-10-CM | POA: Diagnosis not present

## 2014-12-26 DIAGNOSIS — D509 Iron deficiency anemia, unspecified: Secondary | ICD-10-CM | POA: Insufficient documentation

## 2014-12-26 DIAGNOSIS — R441 Visual hallucinations: Secondary | ICD-10-CM | POA: Diagnosis not present

## 2014-12-26 DIAGNOSIS — J45909 Unspecified asthma, uncomplicated: Secondary | ICD-10-CM | POA: Diagnosis not present

## 2014-12-26 DIAGNOSIS — Z79891 Long term (current) use of opiate analgesic: Secondary | ICD-10-CM | POA: Diagnosis not present

## 2014-12-26 DIAGNOSIS — Z7982 Long term (current) use of aspirin: Secondary | ICD-10-CM | POA: Insufficient documentation

## 2014-12-26 DIAGNOSIS — R55 Syncope and collapse: Secondary | ICD-10-CM | POA: Diagnosis not present

## 2014-12-26 DIAGNOSIS — K7460 Unspecified cirrhosis of liver: Secondary | ICD-10-CM | POA: Diagnosis not present

## 2014-12-26 DIAGNOSIS — I130 Hypertensive heart and chronic kidney disease with heart failure and stage 1 through stage 4 chronic kidney disease, or unspecified chronic kidney disease: Secondary | ICD-10-CM | POA: Insufficient documentation

## 2014-12-26 DIAGNOSIS — E119 Type 2 diabetes mellitus without complications: Secondary | ICD-10-CM

## 2014-12-26 DIAGNOSIS — E1129 Type 2 diabetes mellitus with other diabetic kidney complication: Secondary | ICD-10-CM | POA: Diagnosis present

## 2014-12-26 DIAGNOSIS — IMO0002 Reserved for concepts with insufficient information to code with codable children: Secondary | ICD-10-CM | POA: Diagnosis present

## 2014-12-26 DIAGNOSIS — K7682 Hepatic encephalopathy: Secondary | ICD-10-CM | POA: Diagnosis present

## 2014-12-26 DIAGNOSIS — IMO0001 Reserved for inherently not codable concepts without codable children: Secondary | ICD-10-CM | POA: Insufficient documentation

## 2014-12-26 DIAGNOSIS — Z794 Long term (current) use of insulin: Secondary | ICD-10-CM | POA: Diagnosis not present

## 2014-12-26 DIAGNOSIS — R059 Cough, unspecified: Secondary | ICD-10-CM

## 2014-12-26 DIAGNOSIS — E11649 Type 2 diabetes mellitus with hypoglycemia without coma: Secondary | ICD-10-CM | POA: Insufficient documentation

## 2014-12-26 DIAGNOSIS — E1122 Type 2 diabetes mellitus with diabetic chronic kidney disease: Secondary | ICD-10-CM | POA: Insufficient documentation

## 2014-12-26 DIAGNOSIS — E162 Hypoglycemia, unspecified: Secondary | ICD-10-CM | POA: Diagnosis present

## 2014-12-26 LAB — CBG MONITORING, ED
Glucose-Capillary: 60 mg/dL — ABNORMAL LOW (ref 65–99)
Glucose-Capillary: 82 mg/dL (ref 65–99)
Glucose-Capillary: 104 mg/dL — ABNORMAL HIGH (ref 65–99)

## 2014-12-26 LAB — CBC WITH DIFFERENTIAL/PLATELET
Basophils Absolute: 0.1 10*3/uL (ref 0.0–0.1)
Basophils Relative: 1 %
EOS ABS: 0.4 10*3/uL (ref 0.0–0.7)
EOS PCT: 7 %
HCT: 33.5 % — ABNORMAL LOW (ref 36.0–46.0)
Hemoglobin: 11.2 g/dL — ABNORMAL LOW (ref 12.0–15.0)
LYMPHS ABS: 1.8 10*3/uL (ref 0.7–4.0)
Lymphocytes Relative: 30 %
MCH: 33.9 pg (ref 26.0–34.0)
MCHC: 33.4 g/dL (ref 30.0–36.0)
MCV: 101.5 fL — ABNORMAL HIGH (ref 78.0–100.0)
Monocytes Absolute: 0.7 10*3/uL (ref 0.1–1.0)
Monocytes Relative: 12 %
Neutro Abs: 3.1 10*3/uL (ref 1.7–7.7)
Neutrophils Relative %: 50 %
PLATELETS: 208 10*3/uL (ref 150–400)
RBC: 3.3 MIL/uL — AB (ref 3.87–5.11)
RDW: 17.6 % — ABNORMAL HIGH (ref 11.5–15.5)
WBC: 6.1 10*3/uL (ref 4.0–10.5)

## 2014-12-26 LAB — LACTIC ACID, PLASMA: Lactic Acid, Venous: 2.1 mmol/L (ref 0.5–2.0)

## 2014-12-26 LAB — URINALYSIS, ROUTINE W REFLEX MICROSCOPIC
Bilirubin Urine: NEGATIVE
GLUCOSE, UA: NEGATIVE mg/dL
Ketones, ur: NEGATIVE mg/dL
Nitrite: POSITIVE — AB
PH: 6.5 (ref 5.0–8.0)
Protein, ur: NEGATIVE mg/dL
Specific Gravity, Urine: 1.012 (ref 1.005–1.030)
Urobilinogen, UA: 2 mg/dL — ABNORMAL HIGH (ref 0.0–1.0)

## 2014-12-26 LAB — AMMONIA: Ammonia: 49 umol/L — ABNORMAL HIGH (ref 9–35)

## 2014-12-26 LAB — COMPREHENSIVE METABOLIC PANEL
ALK PHOS: 170 U/L — AB (ref 38–126)
ALT: 14 U/L (ref 14–54)
AST: 35 U/L (ref 15–41)
Albumin: 2.4 g/dL — ABNORMAL LOW (ref 3.5–5.0)
Anion gap: 7 (ref 5–15)
BILIRUBIN TOTAL: 1.6 mg/dL — AB (ref 0.3–1.2)
BUN: 19 mg/dL (ref 6–20)
CHLORIDE: 105 mmol/L (ref 101–111)
CO2: 28 mmol/L (ref 22–32)
CREATININE: 1.5 mg/dL — AB (ref 0.44–1.00)
Calcium: 8.7 mg/dL — ABNORMAL LOW (ref 8.9–10.3)
GFR calc Af Amer: 37 mL/min — ABNORMAL LOW (ref >60)
GFR, EST NON AFRICAN AMERICAN: 32 mL/min — AB (ref >60)
Glucose, Bld: 145 mg/dL — ABNORMAL HIGH (ref 65–99)
Potassium: 3.8 mmol/L (ref 3.5–5.1)
Sodium: 140 mmol/L (ref 135–145)
TOTAL PROTEIN: 7.2 g/dL (ref 6.5–8.1)

## 2014-12-26 LAB — PROTIME-INR
INR: 1.07 (ref 0.00–1.49)
PROTHROMBIN TIME: 14.1 s (ref 11.6–15.2)

## 2014-12-26 LAB — URINE MICROSCOPIC-ADD ON

## 2014-12-26 LAB — GLUCOSE, CAPILLARY: Glucose-Capillary: 141 mg/dL — ABNORMAL HIGH (ref 65–99)

## 2014-12-26 LAB — TROPONIN I

## 2014-12-26 MED ORDER — BUPROPION HCL ER (XL) 300 MG PO TB24
300.0000 mg | ORAL_TABLET | Freq: Every day | ORAL | Status: DC
  Administered 2014-12-26 – 2014-12-30 (×5): 300 mg via ORAL
  Filled 2014-12-26 (×5): qty 1

## 2014-12-26 MED ORDER — URSODIOL 300 MG PO CAPS
300.0000 mg | ORAL_CAPSULE | Freq: Three times a day (TID) | ORAL | Status: DC
  Administered 2014-12-26 – 2014-12-30 (×11): 300 mg via ORAL
  Filled 2014-12-26 (×13): qty 1

## 2014-12-26 MED ORDER — GABAPENTIN 300 MG PO CAPS
300.0000 mg | ORAL_CAPSULE | Freq: Every day | ORAL | Status: DC
  Administered 2014-12-26 – 2014-12-29 (×4): 300 mg via ORAL
  Filled 2014-12-26 (×4): qty 1

## 2014-12-26 MED ORDER — PRAVASTATIN SODIUM 20 MG PO TABS
20.0000 mg | ORAL_TABLET | Freq: Every day | ORAL | Status: DC
  Administered 2014-12-26 – 2014-12-29 (×4): 20 mg via ORAL
  Filled 2014-12-26 (×5): qty 1

## 2014-12-26 MED ORDER — MOMETASONE FURO-FORMOTEROL FUM 100-5 MCG/ACT IN AERO
2.0000 | INHALATION_SPRAY | Freq: Two times a day (BID) | RESPIRATORY_TRACT | Status: DC
Start: 2014-12-26 — End: 2014-12-30
  Filled 2014-12-26: qty 8.8

## 2014-12-26 MED ORDER — INSULIN ASPART 100 UNIT/ML ~~LOC~~ SOLN
0.0000 [IU] | SUBCUTANEOUS | Status: DC
Start: 2014-12-26 — End: 2014-12-28
  Administered 2014-12-26: 1 [IU] via SUBCUTANEOUS
  Administered 2014-12-27: 2 [IU] via SUBCUTANEOUS
  Administered 2014-12-27: 1 [IU] via SUBCUTANEOUS
  Administered 2014-12-27 (×3): 2 [IU] via SUBCUTANEOUS
  Administered 2014-12-28: 3 [IU] via SUBCUTANEOUS
  Administered 2014-12-28: 2 [IU] via SUBCUTANEOUS
  Administered 2014-12-28: 3 [IU] via SUBCUTANEOUS
  Administered 2014-12-28: 7 [IU] via SUBCUTANEOUS
  Administered 2014-12-28 (×2): 2 [IU] via SUBCUTANEOUS

## 2014-12-26 MED ORDER — FAMOTIDINE 20 MG PO TABS
20.0000 mg | ORAL_TABLET | Freq: Every day | ORAL | Status: DC
  Administered 2014-12-27 – 2014-12-30 (×4): 20 mg via ORAL
  Filled 2014-12-26 (×4): qty 1

## 2014-12-26 MED ORDER — GUAIFENESIN ER 600 MG PO TB12
600.0000 mg | ORAL_TABLET | Freq: Two times a day (BID) | ORAL | Status: DC
  Administered 2014-12-26 – 2014-12-30 (×8): 600 mg via ORAL
  Filled 2014-12-26 (×12): qty 1

## 2014-12-26 MED ORDER — IPRATROPIUM BROMIDE 0.02 % IN SOLN: 0.5000 mg | Freq: Four times a day (QID) | RESPIRATORY_TRACT | Status: DC

## 2014-12-26 MED ORDER — ONDANSETRON HCL 4 MG/2ML IJ SOLN: 4.0000 mg | Freq: Four times a day (QID) | INTRAMUSCULAR | Status: DC | PRN

## 2014-12-26 MED ORDER — ALBUTEROL SULFATE (2.5 MG/3ML) 0.083% IN NEBU: 2.5000 mg | INHALATION_SOLUTION | RESPIRATORY_TRACT | Status: DC | PRN

## 2014-12-26 MED ORDER — ATENOLOL 50 MG PO TABS
50.0000 mg | ORAL_TABLET | Freq: Every day | ORAL | Status: DC
  Administered 2014-12-26 – 2014-12-29 (×4): 50 mg via ORAL
  Filled 2014-12-26 (×5): qty 1

## 2014-12-26 MED ORDER — ONDANSETRON HCL 4 MG PO TABS
4.0000 mg | ORAL_TABLET | Freq: Four times a day (QID) | ORAL | Status: DC | PRN
Start: 2014-12-26 — End: 2014-12-30

## 2014-12-26 MED ORDER — SODIUM CHLORIDE 0.9 % IJ SOLN
3.0000 mL | Freq: Two times a day (BID) | INTRAMUSCULAR | Status: DC
  Administered 2014-12-26 – 2014-12-30 (×8): 3 mL via INTRAVENOUS

## 2014-12-26 NOTE — Discharge Instructions (Signed)

## 2014-12-26 NOTE — ED Notes (Signed)
Per EMS, pt from [facility].  Pt here with syncopal episode.  cbg 32.  Glucose given.  Food given.  cbg came up to 72 at 1145.  Has not been checked since then.  Pt is on insulin and oral meds.  Pt states she has not eaten since yesterday.  Was seen in ER yesterday also for pain.  No episodes of n/v/d to explain hypoglycemia.  Vitals: 170/67, hr 56, 97% ra, NSR - brady, 20g RAC in route.

## 2014-12-26 NOTE — ED Notes (Signed)
Called to facility regarding patient returning.  Director is concerned about pt having "hallucinations".  Unsure of her ability to care for herself.  Notified MD for further evaluation.

## 2014-12-26 NOTE — Progress Notes (Addendum)
[facility] consulted for assistance with higher level of care. Director of patient's facility concerned about patient in regards to confusion and possibly needing ALF.   Patient is currently in [place] [facility].  EDCM contacted Stanton Kidney the Mudlogger of [facility] to check for bed availability.  Mary's phone number is [phone].  Stanton Kidney reports patient is usually very "sharp, she takes care of her own finances."  Stanton Kidney reports when she saw patient today she wasn't herself. She reports the patient was doing very well this week until today.  She reports the patient was hallucinating.  She also reports patient has an emergency pendent she can push when her blood sugar goes down, to alert the nurses, but patient did not push it this time.  Stanton Kidney reports the patient is generally wheelchair bound, but has weakness to her left side due to a laminectomy she has had in the past.  She reports patient is usually able to stand and use her walker.  Stanton Kidney reports the patient does not have any family, she has a friend in [place] she considers family and that's all she has.  Mary reports  There is no bed availability for ALF this evening, and they are unable to admit this weekend because their admission person is out of town.  She reports Monday there may be a bed available.  EDCM explained to [facility] that patient may be discharged tomorrow as she is being admitted as Observation status.Stanton Kidney reports she can try to arrange for a sitter to stay with the patient when she comes back until she is able to moved to higher level of care.  Stanton Kidney reports the patient is difficult and will not leave her cats at home.  Stanton Kidney reports she has discussed ALF with patient in the past, but he patient has refused.  Stanton Kidney reports it may come to a point when they may have to force the patient out of Independent living for safety reasons.  Discussed patient with EDP and admitting physician.  Patient to be admitted for Observation.  EDCM  consulted EDSW  Please call Stanton Kidney the director of [facility] prior to patient's discharge so that she can make arrangements for a Charity fundraiser.  [phone]

## 2014-12-26 NOTE — H&P (Signed)
PCP: Curly Rim, MD    Referring provider Jacubowitz   Chief Complaint:  syncope  HPI: Tamara Stephenson is a 81 y.o. female   has a past medical history of Back pain; Hypertension; Carotid stenosis; Colitis; and Diabetes mellitus without complication ([place]).   Presented with  Patient was brought in from [facility] with syncopal episode .At the time she was found to have CBG of 32. She was treated with glucose and hypoglycemia has improved patient has known history of diabetes on insulin and glimepiride. Patient has been endorsing decreased by mouth intake. Also note on October 20 the patient was seen in emergency department for abdominal pain CT scan done and showed evidence of cirrhosis and moderate ascites patient has history of fatty liver disease ascites is a new finding since 2015. Otherwise there was findings of chronic hernias small pleural effusion pulse or gallbladder and left nephrolithiasis in the left lower pole nonobstructing. Patient was found to a mildly elevated ammonia level up to49 Syncope was thought to be secondary to hypoglycemia. The plan was for patient to be discharged back to [facility] [place]. Patient currently resides in independent living. The independently with supervisor was contacted but was concerned given patient has been having history of hallucinations opacity months it has been getting worse he felt uncomfortable accepted her to independent living.  Of note patient reports frequent falls she has hit her head a few weeks ago but have not been evaluated for that since. She endorses episodes of hallucinations where she seen people in her apartment will occasionally walk through walls patient had had similar episode in August which was thought to be secondary to dementia. At her baseline patient is sharp and able to take care of her own finances and reports occasional episode of these hallucinations associated with transient confusion.   Patient denies any fever  no chills no nausea no vomiting no diarrhea no chest pain she has been having some wheezing for past 1 month patient reports still smoking and saying she is interested in quitting denies worsening shortness of breath cough.   Hospitalist was called for admission for syncope and elevated ammonia level progressive hallucinations  Review of Systems:    Pertinent positives include:  Syncope, poor appetite  Constitutional:  No weight loss, night sweats, Fevers, chills, fatigue, weight loss  HEENT:  No headaches, Difficulty swallowing,Tooth/dental problems,Sore throat,  No sneezing, itching, ear ache, nasal congestion, post nasal drip,  Cardio-vascular:  No chest pain, Orthopnea, PND, anasarca, dizziness, palpitations.no Bilateral lower extremity swelling  GI:  No heartburn, indigestion, abdominal pain, nausea, vomiting, diarrhea, change in bowel habits, loss of appetite, melena, blood in stool, hematemesis Resp:  no shortness of breath at rest. No dyspnea on exertion, No excess mucus, no productive cough, No non-productive cough, No coughing up of blood.No change in color of mucus.No wheezing. Skin:  no rash or lesions. No jaundice GU:  no dysuria, change in color of urine, no urgency or frequency. No straining to urinate.  No flank pain.  Musculoskeletal:  No joint pain or no joint swelling. No decreased range of motion. No back pain.  Psych:  No change in mood or affect. No depression or anxiety. No memory loss.  Neuro: no localizing neurological complaints, no tingling, no weakness, no double vision, no gait abnormality, no slurred speech, no confusion  Otherwise ROS are negative except for above, 10 systems were reviewed  Past Medical History: Past Medical History  Diagnosis Date  . Back pain   .  Hypertension   . Carotid stenosis   . Colitis   . Diabetes mellitus without complication [facility])    Past Surgical History  Procedure Laterality Date  . Carotid endarterectomy    .  Lumbar laminectomy for epidural abscess N/A 10/16/2013    Procedure: LUMBAR LAMINECTOMY FOR EPIDURAL ABSCESS Lumbar Two through Four;  Surgeon: Charlie Pitter, MD;  Location: [facility];  Service: Neurosurgery;  Laterality: N/A;     Medications: Prior to Admission medications   Medication Sig Start Date End Date Taking? Authorizing Provider  acetaminophen (TYLENOL) 500 MG tablet Take 500 mg by mouth every 6 (six) hours as needed for moderate pain.    Yes Historical Provider, MD  aspirin (ASPIRIN EC) 81 MG EC tablet Take 162 mg by mouth at bedtime. Swallow whole.   Yes Historical Provider, MD  atenolol (TENORMIN) 100 MG tablet Take 50-100 mg by mouth daily. Pt takes a half a tablet= 50mg  around lunch and then a whole tablet at night for a total dose of 150mg  qd 07/10/14  Yes Historical Provider, MD  buPROPion (WELLBUTRIN XL) 300 MG 24 hr tablet Take 300 mg by mouth daily.   Yes Historical Provider, MD  cholecalciferol (VITAMIN D) 1000 UNITS tablet Take 2,000 Units by mouth daily.    Yes Historical Provider, MD  Cyanocobalamin (VITAMIN B 12) 100 MCG LOZG Take 1 tablet by mouth every other day.    Yes Historical Provider, MD  gabapentin (NEURONTIN) 300 MG capsule Take 1 capsule (300 mg total) by mouth at bedtime. 10/21/13  Yes Marianne L York, PA-C  glimepiride (AMARYL) 4 MG tablet Take 4 mg by mouth 2 (two) times daily. 04/15/14  Yes Historical Provider, MD  insulin detemir (LEVEMIR) 100 UNIT/ML injection Inject 0.12 mLs (12 Units total) into the skin at bedtime. 10/21/13  Yes Marianne L York, PA-C  lovastatin (MEVACOR) 20 MG tablet Take 20 mg by mouth at bedtime.   Yes Historical Provider, MD  oxyCODONE (OXY IR/ROXICODONE) 5 MG immediate release tablet Take 1 tablet (5 mg total) by mouth every 4 (four) hours as needed for severe pain. 09/20/14  Yes Christina P Rama, MD  ranitidine (ZANTAC) 300 MG tablet Take 300 mg by mouth at bedtime.   Yes Historical Provider, MD  ursodiol (ACTIGALL) 300 MG capsule Take  300 mg by mouth 3 (three) times daily.   Yes Historical Provider, MD  insulin aspart (NOVOLOG) 100 UNIT/ML injection Inject 3 Units into the skin 3 (three) times daily with meals. Patient not taking: Reported on 12/26/2014 10/21/13   Bobby Rumpf York, PA-C  ondansetron (ZOFRAN) 4 MG tablet Take 1 tablet (4 mg total) by mouth every 6 (six) hours as needed for nausea. Patient not taking: Reported on 12/26/2014 09/20/14   Venetia Maxon Rama, MD    Allergies:   Allergies  Allergen Reactions  . Peanut-Containing Drug Products Nausea And Vomiting    Social History:  Ambulatory  independently     From facility [facility]   reports that she has been smoking.  She does not have any smokeless tobacco history on file. She reports that she does not drink alcohol or use illicit drugs.    Family History: family history includes Jaundice in her father; Leukemia in her mother.    Physical Exam: Patient Vitals for the past 24 hrs:  BP Temp Temp src Pulse Resp SpO2  12/26/14 1755 165/77 mmHg - - 66 14 93 %  12/26/14 1547 168/84 mmHg - - (!) 58  11 95 %  12/26/14 1322 162/75 mmHg 98 F (36.7 C) Oral (!) 59 13 94 %    1. General:  in No Acute distress 2. Psychological: Alert and  Oriented 4 3. Head/ENT:    Dry Mucous Membranes                          Head Non traumatic, neck supple                          Normal    Dentition 4. SKIN:  decreased Skin turgor,  Skin clean Dry and intact no rash 5. Heart: Regular rate and rhythm no Murmur, Rub or gallop 6. Lungs:occasional wheezes and crackles   7. Abdomen: Soft, non-tender, distended, ventral hernia present 8. Lower extremities: no clubbing, cyanosis, trace edema areas of occasional blistering noted 9. Neurologically Grossly intact, moving all 4 extremities equally 10. MSK: Normal range of motion  body mass index is unknown because there is no weight on file.   Labs on Admission:   Results for orders placed or performed during the  hospital encounter of 12/26/14 (from the past 24 hour(s))  CBG monitoring, ED     Status: Abnormal   Collection Time: 12/26/14  1:25 PM  Result Value Ref Range   Glucose-Capillary 60 (L) 65 - 99 mg/dL  CBG monitoring, ED     Status: None   Collection Time: 12/26/14  2:28 PM  Result Value Ref Range   Glucose-Capillary 82 65 - 99 mg/dL  CBG monitoring, ED     Status: Abnormal   Collection Time: 12/26/14  3:56 PM  Result Value Ref Range   Glucose-Capillary 104 (H) 65 - 99 mg/dL  Ammonia     Status: Abnormal   Collection Time: 12/26/14  5:09 PM  Result Value Ref Range   Ammonia 49 (H) 9 - 35 umol/L    UA odered  Lab Results  Component Value Date   HGBA1C 7.6* 09/15/2014    CrCl cannot be calculated (Unknown ideal weight.).  BNP (last 3 results) No results for input(s): PROBNP in the last 8760 hours.  Other results:  I have pearsonaly reviewed this: ECG REPORT Heart rate 66 sinus rhythm minimal ST depression in lateral leads noted QTC 475  There were no vitals filed for this visit.   Cultures:    Component Value Date/Time   SDES URINE, CLEAN CATCH 11/05/2014 1952   SPECREQUEST Normal 11/05/2014 1952   CULT  11/05/2014 1952    MULTIPLE SPECIES PRESENT, SUGGEST RECOLLECTION Performed at [facility] 11/07/2014 FINAL 11/05/2014 1952     Radiological Exams on Admission: Ct Abdomen Pelvis Wo Contrast  12/25/2014  CLINICAL DATA:  Generalized abdominal pain EXAM: CT ABDOMEN AND PELVIS WITHOUT CONTRAST TECHNIQUE: Multidetector CT imaging of the abdomen and pelvis was performed following the standard protocol without IV contrast. COMPARISON:  10/12/2013 FINDINGS: Lower chest and abdominal wall: Small bilateral pleural effusion which is new. Chronic umbilical and supraumbilical hernias containing fat and now with ascitic fluid. Hepatobiliary: Cirrhotic liver morphology without gross mass lesion. There is new moderate ascites. No splenomegaly.Partial but  diffuse mural calcification of the gallbladder wall without typical layering stone. No gallbladder inflammation or over distension. Pancreas: Unremarkable. Spleen: Unremarkable. Adrenals/Urinary Tract: Stable low-density lobulation of the adrenals, left more than right, consistent with adenomas. Left lower pole 3 mm calculus. No hydronephrosis. Unremarkable bladder. Reproductive:No pathologic  findings. Stomach/Bowel:  No obstruction. No appendicitis. Vascular/Lymphatic: No acute vascular abnormality. Enlarged deep liver drainage lymph nodes, nonspecific and usually reactive in the setting of cirrhosis. Musculoskeletal: Severe and diffuse disc degeneration and facet arthropathy with lumbar levoscoliosis. Multilevel lumbar laminectomy. No acute osseous finding. IMPRESSION: 1. Cirrhosis with moderate ascites. Ascites has developed since 2015. 2. The questioned umbilical and supraumbilical hernias are chronic and stable other than new ascitic fluid. 3. Small pleural effusions. 4. Porcelain gallbladder. 5. Left nephrolithiasis. Electronically Signed   By: Monte Fantasia M.D.   On: 12/25/2014 17:15    Chart has been reviewed  Family not  at  Bedside    Assessment/Plan  81 year old female with history of diabetes, chronic kidney disease presents after an episode of syncope in the setting of hypoglycemia currently improved  Present on Admission:  . Diabetes mellitus with renal manifestations, uncontrolled (HCC) hold insulin and Amaryl follow closely glucose levels  . CKD (chronic kidney disease) stage 3, GFR 30-59 ml/min currently a baseline continue to follow  . Anemia, iron deficiency appears to be stable  . Hallucinations, visual - etiology unclear but this has been a recurrent issue. If there is no medical causes would recommend psychiatry evaluation this could be done as an outpatient. For now will obtain CT of the head given history of falls. Obtain UA to evaluate for any evidence of infection  .  Syncope this was most likely in the setting of hypoglycemia EKG shows mild ST depression. No chest pain or shortness of breath but will cycle cardiac enzymes and monitor on telemetry given peripheral edema which has been persistent obtain echogram to evaluate for CHF  . Hypoglycemia will hold insulin and by mouth medications monitor closely  . Hepatic encephalopathy ([place]) patient is somewhat elevated ammonia level currently does not appear to be confused no asterixis noted it is unclear if visual hallucinations could be secondary to transient hepatic encephalopathy can start patient on low-dose lactulose  . Cirrhosis of liver with ascites ([place]) patient denies history of alcohol abuse this is most likely secondary to [place]. She would benefit from GI follow-up. He'll obtain hepatic panel as part of work   . Reactive airway disease examination continues to smoke she has been having occasional wheezing for the past few weeks. No worsening coughing no worsening shortness of breath. We will make sure the patient was on a when necessary albuterol and scheduled Atrovent and she will probably benefit from [facility] as well will need to discontinue smoking   Prophylaxis: SCD ordered on the Lovenox until CT of the head has been done  CODE STATUS:    DNR/DNI as per patient    Disposition:                            Back to current facility when stable may need higher level of care                            Other plan as per orders.  I have spent a total of 65 min on this admission except and was taken to discuss case with Case management regarding patient's discharge planning  [place] 12/26/2014, 6:58 PM  [facility]  Pager [phone]   after 2 AM please page floor coverage PA If 7AM-7PM, please contact the day team taking care of the patient  [email or web address]  Password [email or web address]

## 2014-12-26 NOTE — ED Notes (Signed)
Primary nurse spoke to patient, now agreeable to chest XR. Radiology updated.

## 2014-12-26 NOTE — ED Notes (Signed)
Pt request for nurse or doctor to cut her toe nail

## 2014-12-26 NOTE — ED Notes (Signed)
Patient refused chest XR, MD notified.

## 2014-12-26 NOTE — ED Notes (Signed)
Bed: WA01 Expected date:  Expected time:  Means of arrival:  Comments: 

## 2014-12-26 NOTE — ED Provider Notes (Signed)
Addendum director of independent living facility called to add further history that patient has had hallucinations saying "cats with things on their heads" this is been going on for the past several week. I asked the patient about visual hallucinations or seeing things that aren't there or hearing things that aren't there. She states that she seen "people with leprosy" in her apartment for the past 3 months they don't disturb her. She presently is not hallucinating and appears appropriate Glasgow Coma Score 15 and no distress. Given ascites seen on yesterday's CT scan ammonia level has been ordered presently patient is alert and appropriate and in no distress In light of elevated ammonia level and visual hallucinations, company by ascites seen on yesterday's abdominal CT scan consider encephalopathy. Spoke with Dr. Roel Cluck who will see pt in there ED. Dr Roel Cluck arranged for inpt stay   Orlie Dakin, MD 12/27/14 [phone]

## 2014-12-26 NOTE — ED Notes (Signed)
Critical Lactic 2.1 Read Back and Verified Primary RN and MD notified.

## 2014-12-26 NOTE — Progress Notes (Signed)
CSW was consulted by Nurse CM to speak with patient about level of care.  CSW met with patient at bedside. Patient informed CSW that she believes she is at the right level of care. She states she is not interested in assisted living. Patient stated " I would kill myself if I had to". Patient says that she has been able to take care of herself.  Patient informed CSW that she completes her ADL's independently. Patient admits that she has fallen x4 in the past 6 months.   Patient states that most of her family is in [place]. Patient informed CSW that she has 1 friend in [place] who she speaks with often.   CSW consulted with Nurse CM who states she has spoke with facility Director. She Magazine features editor state that she will arrange for a sitter to be with patient once she returns to facility.  CSW reached out to Facility Nurse for update about bed availability.  Nurse states there are no open ALF beds.  Willette Brace [phone] ED CSW 12/26/2014 8:39 PM

## 2014-12-26 NOTE — ED Provider Notes (Signed)
CSN: [id number]     Arrival date & time 12/26/14  1300 History   First MD Initiated Contact with Patient 12/26/14 1306     Chief Complaint  Patient presents with  . Loss of Consciousness     (Consider location/radiation/quality/duration/timing/severity/associated sxs/prior Treatment) Patient is a 81 y.o. female presenting with syncope. The history is provided by the patient and the EMS personnel.  Loss of Consciousness Episode history:  Single Most recent episode:  Today Duration:  1 minute Timing:  Constant Progression:  Resolved Chronicity:  New Context comment:  Not eating, on glimepiride and insulin Witnessed: yes   Relieved by:  Sugar/glucose Associated symptoms: no difficulty breathing, no fever and no shortness of breath     Past Medical History  Diagnosis Date  . Back pain   . Hypertension   . Carotid stenosis   . Colitis   . Diabetes mellitus without complication [facility])    Past Surgical History  Procedure Laterality Date  . Carotid endarterectomy    . Lumbar laminectomy for epidural abscess N/A 10/16/2013    Procedure: LUMBAR LAMINECTOMY FOR EPIDURAL ABSCESS Lumbar Two through Four;  Surgeon: Charlie Pitter, MD;  Location: [place] [facility]UR[facility] ORS;  Service: Neurosurgery;  Laterality: N/A;   Family History  Problem Relation Age of Onset  . Leukemia Mother   . Jaundice Father    Social History  Substance Use Topics  . Smoking status: Current Every Day Smoker -- 0.20 packs/day  . Smokeless tobacco: None  . Alcohol Use: No   OB History    No data available     Review of Systems  Constitutional: Negative for fever.  Respiratory: Negative for shortness of breath.   Cardiovascular: Positive for syncope.  All other systems reviewed and are negative.     Allergies  Peanut-containing drug products  Home Medications   Prior to Admission medications   Medication Sig Start Date End Date Taking? Authorizing Provider  acetaminophen (TYLENOL) 500 MG tablet Take 500 mg  by mouth every 6 (six) hours as needed for moderate pain.    Yes Historical Provider, MD  aspirin (ASPIRIN EC) 81 MG EC tablet Take 162 mg by mouth at bedtime. Swallow whole.   Yes Historical Provider, MD  atenolol (TENORMIN) 100 MG tablet Take 50-100 mg by mouth daily. Pt takes a half a tablet= 50mg  around lunch and then a whole tablet at night for a total dose of 150mg  qd 07/10/14  Yes Historical Provider, MD  buPROPion (WELLBUTRIN XL) 300 MG 24 hr tablet Take 300 mg by mouth daily.   Yes Historical Provider, MD  cholecalciferol (VITAMIN D) 1000 UNITS tablet Take 2,000 Units by mouth daily.    Yes Historical Provider, MD  Cyanocobalamin (VITAMIN B 12) 100 MCG LOZG Take 1 tablet by mouth every other day.    Yes Historical Provider, MD  gabapentin (NEURONTIN) 300 MG capsule Take 1 capsule (300 mg total) by mouth at bedtime. 10/21/13  Yes Marianne L York, PA-C  glimepiride (AMARYL) 4 MG tablet Take 4 mg by mouth 2 (two) times daily. 04/15/14  Yes Historical Provider, MD  insulin detemir (LEVEMIR) 100 UNIT/ML injection Inject 0.12 mLs (12 Units total) into the skin at bedtime. 10/21/13  Yes Marianne L York, PA-C  lovastatin (MEVACOR) 20 MG tablet Take 20 mg by mouth at bedtime.   Yes Historical Provider, MD  oxyCODONE (OXY IR/ROXICODONE) 5 MG immediate release tablet Take 1 tablet (5 mg total) by mouth every 4 (four) hours as needed  for severe pain. 09/20/14  Yes Christina P Rama, MD  ranitidine (ZANTAC) 300 MG tablet Take 300 mg by mouth at bedtime.   Yes Historical Provider, MD  ursodiol (ACTIGALL) 300 MG capsule Take 300 mg by mouth 3 (three) times daily.   Yes Historical Provider, MD  insulin aspart (NOVOLOG) 100 UNIT/ML injection Inject 3 Units into the skin 3 (three) times daily with meals. Patient not taking: Reported on 12/26/2014 10/21/13   Bobby Rumpf York, PA-C  ondansetron (ZOFRAN) 4 MG tablet Take 1 tablet (4 mg total) by mouth every 6 (six) hours as needed for nausea. Patient not taking: Reported  on 12/26/2014 09/20/14   Venetia Maxon Rama, MD   BP 168/84 mmHg  Pulse 58  Temp(Src) 98 F (36.7 C) (Oral)  Resp 11  SpO2 95% Physical Exam  Constitutional: She is oriented to person, place, and time. She appears well-developed and well-nourished. No distress.  HENT:  Head: Normocephalic.  Eyes: Conjunctivae are normal.  Neck: Neck supple. No tracheal deviation present.  Cardiovascular: Normal rate and regular rhythm.   Pulmonary/Chest: Effort normal. No respiratory distress.  Abdominal: Soft. She exhibits no distension.  Neurological: She is alert and oriented to person, place, and time.  Skin: Skin is warm and dry.  Psychiatric: She has a normal mood and affect.  Vitals reviewed.   ED Course  Procedures (including critical care time) Labs Review Labs Reviewed  CBG MONITORING, ED - Abnormal; Notable for the following:    Glucose-Capillary 60 (*)    All other components within normal limits  CBG MONITORING, ED    Imaging Review Ct Abdomen Pelvis Wo Contrast  12/25/2014  CLINICAL DATA:  Generalized abdominal pain EXAM: CT ABDOMEN AND PELVIS WITHOUT CONTRAST TECHNIQUE: Multidetector CT imaging of the abdomen and pelvis was performed following the standard protocol without IV contrast. COMPARISON:  10/12/2013 FINDINGS: Lower chest and abdominal wall: Small bilateral pleural effusion which is new. Chronic umbilical and supraumbilical hernias containing fat and now with ascitic fluid. Hepatobiliary: Cirrhotic liver morphology without gross mass lesion. There is new moderate ascites. No splenomegaly.Partial but diffuse mural calcification of the gallbladder wall without typical layering stone. No gallbladder inflammation or over distension. Pancreas: Unremarkable. Spleen: Unremarkable. Adrenals/Urinary Tract: Stable low-density lobulation of the adrenals, left more than right, consistent with adenomas. Left lower pole 3 mm calculus. No hydronephrosis. Unremarkable bladder. Reproductive:No  pathologic findings. Stomach/Bowel:  No obstruction. No appendicitis. Vascular/Lymphatic: No acute vascular abnormality. Enlarged deep liver drainage lymph nodes, nonspecific and usually reactive in the setting of cirrhosis. Musculoskeletal: Severe and diffuse disc degeneration and facet arthropathy with lumbar levoscoliosis. Multilevel lumbar laminectomy. No acute osseous finding. IMPRESSION: 1. Cirrhosis with moderate ascites. Ascites has developed since 2015. 2. The questioned umbilical and supraumbilical hernias are chronic and stable other than new ascitic fluid. 3. Small pleural effusions. 4. Porcelain gallbladder. 5. Left nephrolithiasis. Electronically Signed   By: Monte Fantasia M.D.   On: 12/25/2014 17:15   I have personally reviewed and evaluated these images and lab results as part of my medical decision-making.   EKG Interpretation None      MDM   Final diagnoses:  Hypoglycemia    81 year old feel presents from her nursing facility after having a syncopal episode earlier this morning. On EMS arrival her glucose was 32, she was given glucose and food which caused her blood sugar to rebound and 60 arrival where she is asymptomatic. She is on antihyperglycemic agent, glimepiride and went nearly 24 hours without eating  despite taking this medication likely resulting in transient hypoglycemia. She was able to eat carbohydrates during her emergency department course and had no recurrence of symptoms. She is otherwise well-appearing and stable for discharge at this time.  Leo Grosser, MD 12/26/14 1556

## 2014-12-27 ENCOUNTER — Observation Stay (HOSPITAL_BASED_OUTPATIENT_CLINIC_OR_DEPARTMENT_OTHER): Payer: Medicare HMO

## 2014-12-27 DIAGNOSIS — R441 Visual hallucinations: Secondary | ICD-10-CM | POA: Diagnosis not present

## 2014-12-27 DIAGNOSIS — N183 Chronic kidney disease, stage 3 (moderate): Secondary | ICD-10-CM | POA: Diagnosis not present

## 2014-12-27 DIAGNOSIS — Z794 Long term (current) use of insulin: Secondary | ICD-10-CM

## 2014-12-27 DIAGNOSIS — K7460 Unspecified cirrhosis of liver: Secondary | ICD-10-CM | POA: Diagnosis not present

## 2014-12-27 DIAGNOSIS — K7290 Hepatic failure, unspecified without coma: Secondary | ICD-10-CM

## 2014-12-27 DIAGNOSIS — R55 Syncope and collapse: Secondary | ICD-10-CM

## 2014-12-27 DIAGNOSIS — E162 Hypoglycemia, unspecified: Secondary | ICD-10-CM | POA: Diagnosis not present

## 2014-12-27 DIAGNOSIS — R609 Edema, unspecified: Secondary | ICD-10-CM | POA: Diagnosis not present

## 2014-12-27 DIAGNOSIS — E119 Type 2 diabetes mellitus without complications: Secondary | ICD-10-CM | POA: Diagnosis not present

## 2014-12-27 DIAGNOSIS — IMO0001 Reserved for inherently not codable concepts without codable children: Secondary | ICD-10-CM | POA: Insufficient documentation

## 2014-12-27 LAB — COMPREHENSIVE METABOLIC PANEL
ALBUMIN: 2.3 g/dL — AB (ref 3.5–5.0)
ALK PHOS: 159 U/L — AB (ref 38–126)
ALT: 12 U/L — AB (ref 14–54)
ALT: 14 U/L (ref 14–54)
ANION GAP: 7 (ref 5–15)
AST: 31 U/L (ref 15–41)
AST: 35 U/L (ref 15–41)
Albumin: 2.2 g/dL — ABNORMAL LOW (ref 3.5–5.0)
Alkaline Phosphatase: 149 U/L — ABNORMAL HIGH (ref 38–126)
Anion gap: 4 — ABNORMAL LOW (ref 5–15)
BUN: 21 mg/dL — ABNORMAL HIGH (ref 6–20)
BUN: 20 mg/dL (ref 6–20)
CHLORIDE: 106 mmol/L (ref 101–111)
CO2: 29 mmol/L (ref 22–32)
CO2: 26 mmol/L (ref 22–32)
Calcium: 8.6 mg/dL — ABNORMAL LOW (ref 8.9–10.3)
Calcium: 8.7 mg/dL — ABNORMAL LOW (ref 8.9–10.3)
Chloride: 108 mmol/L (ref 101–111)
Creatinine, Ser: 1.53 mg/dL — ABNORMAL HIGH (ref 0.44–1.00)
Creatinine, Ser: 1.65 mg/dL — ABNORMAL HIGH (ref 0.44–1.00)
GFR calc non Af Amer: 28 mL/min — ABNORMAL LOW (ref >60)
GFR, EST AFRICAN AMERICAN: 36 mL/min — AB (ref >60)
GFR, EST AFRICAN AMERICAN: 33 mL/min — AB (ref >60)
GFR, EST NON AFRICAN AMERICAN: 31 mL/min — AB (ref >60)
GLUCOSE: 166 mg/dL — AB (ref 65–99)
Glucose, Bld: 184 mg/dL — ABNORMAL HIGH (ref 65–99)
POTASSIUM: 3.8 mmol/L (ref 3.5–5.1)
POTASSIUM: 3.9 mmol/L (ref 3.5–5.1)
SODIUM: 141 mmol/L (ref 135–145)
SODIUM: 139 mmol/L (ref 135–145)
Total Bilirubin: 1.6 mg/dL — ABNORMAL HIGH (ref 0.3–1.2)
Total Bilirubin: 1.4 mg/dL — ABNORMAL HIGH (ref 0.3–1.2)
Total Protein: 6.6 g/dL (ref 6.5–8.1)
Total Protein: 7.1 g/dL (ref 6.5–8.1)

## 2014-12-27 LAB — GLUCOSE, CAPILLARY
GLUCOSE-CAPILLARY: 151 mg/dL — AB (ref 65–99)
GLUCOSE-CAPILLARY: 169 mg/dL — AB (ref 65–99)
Glucose-Capillary: 101 mg/dL — ABNORMAL HIGH (ref 65–99)
Glucose-Capillary: 150 mg/dL — ABNORMAL HIGH (ref 65–99)
Glucose-Capillary: 177 mg/dL — ABNORMAL HIGH (ref 65–99)
Glucose-Capillary: 183 mg/dL — ABNORMAL HIGH (ref 65–99)

## 2014-12-27 LAB — VITAMIN B12: VITAMIN B 12: 1491 pg/mL — AB (ref 180–914)

## 2014-12-27 LAB — AMMONIA
Ammonia: 72 umol/L — ABNORMAL HIGH (ref 9–35)
Ammonia: 76 umol/L — ABNORMAL HIGH (ref 9–35)

## 2014-12-27 LAB — IRON AND TIBC
IRON: 46 ug/dL (ref 28–170)
SATURATION RATIOS: 16 % (ref 10.4–31.8)
TIBC: 290 ug/dL (ref 250–450)
UIBC: 244 ug/dL

## 2014-12-27 LAB — MAGNESIUM: Magnesium: 1.7 mg/dL (ref 1.7–2.4)

## 2014-12-27 LAB — CBC
HEMATOCRIT: 29.9 % — AB (ref 36.0–46.0)
HEMOGLOBIN: 9.9 g/dL — AB (ref 12.0–15.0)
MCH: 33.1 pg (ref 26.0–34.0)
MCHC: 33.1 g/dL (ref 30.0–36.0)
MCV: 100 fL (ref 78.0–100.0)
Platelets: 198 10*3/uL (ref 150–400)
RBC: 2.99 MIL/uL — AB (ref 3.87–5.11)
RDW: 17.6 % — ABNORMAL HIGH (ref 11.5–15.5)
WBC: 6.6 10*3/uL (ref 4.0–10.5)

## 2014-12-27 LAB — MRSA PCR SCREENING: MRSA by PCR: NEGATIVE

## 2014-12-27 LAB — TSH: TSH: 1.606 u[IU]/mL (ref 0.350–4.500)

## 2014-12-27 LAB — RETICULOCYTES
RBC.: 2.99 MIL/uL — AB (ref 3.87–5.11)
RETIC COUNT ABSOLUTE: 77.7 10*3/uL (ref 19.0–186.0)
Retic Ct Pct: 2.6 % (ref 0.4–3.1)

## 2014-12-27 LAB — PHOSPHORUS: PHOSPHORUS: 3.5 mg/dL (ref 2.5–4.6)

## 2014-12-27 LAB — LACTIC ACID, PLASMA
LACTIC ACID, VENOUS: 1.9 mmol/L (ref 0.5–2.0)
Lactic Acid, Venous: 1.5 mmol/L (ref 0.5–2.0)

## 2014-12-27 LAB — FOLATE: FOLATE: 7.3 ng/mL (ref >5.9)

## 2014-12-27 LAB — TROPONIN I: Troponin I: <0.03 ng/mL (ref <0.031)

## 2014-12-27 LAB — FERRITIN: Ferritin: 39 ng/mL (ref 11–307)

## 2014-12-27 MED ORDER — CETYLPYRIDINIUM CHLORIDE 0.05 % MT LIQD
7.0000 mL | Freq: Two times a day (BID) | OROMUCOSAL | Status: DC
  Administered 2014-12-27 – 2014-12-29 (×5): 7 mL via OROMUCOSAL

## 2014-12-27 MED ORDER — CHLORHEXIDINE GLUCONATE 0.12 % MT SOLN
15.0000 mL | Freq: Two times a day (BID) | OROMUCOSAL | Status: DC
  Administered 2014-12-27 – 2014-12-29 (×7): 15 mL via OROMUCOSAL
  Filled 2014-12-27 (×8): qty 15

## 2014-12-27 MED ORDER — RIFAXIMIN 550 MG PO TABS
550.0000 mg | ORAL_TABLET | Freq: Two times a day (BID) | ORAL | Status: DC
  Administered 2014-12-27 – 2014-12-30 (×6): 550 mg via ORAL
  Filled 2014-12-27 (×6): qty 1

## 2014-12-27 MED ORDER — RISAQUAD PO CAPS
1.0000 | ORAL_CAPSULE | Freq: Every day | ORAL | Status: DC
  Administered 2014-12-27 – 2014-12-30 (×4): 1 via ORAL
  Filled 2014-12-27 (×4): qty 1

## 2014-12-27 MED ORDER — DEXTROSE 5 % IV SOLN
1.0000 g | INTRAVENOUS | Status: DC
  Administered 2014-12-27: 1 g via INTRAVENOUS
  Filled 2014-12-27 (×2): qty 10

## 2014-12-27 MED ORDER — LACTULOSE 10 GM/15ML PO SOLN
10.0000 g | Freq: Two times a day (BID) | ORAL | Status: DC
  Administered 2014-12-27 – 2014-12-30 (×6): 10 g via ORAL
  Filled 2014-12-27 (×6): qty 15

## 2014-12-27 MED ORDER — HEPARIN SODIUM (PORCINE) 5000 UNIT/ML IJ SOLN
5000.0000 [IU] | Freq: Three times a day (TID) | INTRAMUSCULAR | Status: DC
  Administered 2014-12-27 – 2014-12-30 (×8): 5000 [IU] via SUBCUTANEOUS
  Filled 2014-12-27 (×9): qty 1

## 2014-12-27 NOTE — Evaluation (Signed)
Physical Therapy Evaluation Patient Details Name: Tamara Stephenson MRN: [id number] DOB: 11/04/1933 Today's Date: 12/27/2014   History of Present Illness  81 yo female adm with syncopal episode and hallucinations. past medical history of Back pain/surgery; Hypertension; Carotid stenosis; Colitis; DM;  Clinical Impression  Pt admitted with above diagnosis. Pt currently with functional limitations due to the deficits listed below (see PT Problem List).  Pt will benefit from skilled PT to increase their independence and safety with mobility to allow discharge to the venue listed below.  Pt will benefit from HHPT at D/C; she reports she does not want to go to the "deathcamp" at [facility]; will continue to follow in acute setting; Pt amb 29' today with RW and min to min/guard assist      Follow Up Recommendations Home health PT (multiple recent falls)    Equipment Recommendations  None recommended by PT    Recommendations for Other Services       Precautions / Restrictions Precautions Precautions: Fall Restrictions Weight Bearing Restrictions: No      Mobility  Bed Mobility Overal bed mobility: Needs Assistance Bed Mobility: Supine to Sit;Sit to Supine     Supine to sit: Supervision Sit to supine: Min assist   General bed mobility comments: min to bring LEs into bed; pt states she "can do it" on her own but requires a lot of effort   Transfers Overall transfer level: Needs assistance Equipment used: Rolling walker (2 wheeled) Transfers: Sit to/from Stand Sit to Stand: Min guard         General transfer comment: incr time and cues for hand placement; requires 2 attempts to come to stand  Ambulation/Gait Ambulation/Gait assistance: Min guard;Min assist Ambulation Distance (Feet): 75 Feet Assistive device: Rolling walker (2 wheeled) Gait Pattern/deviations: Step-through pattern;Decreased stride length;Trunk flexed;Drifts right/left     General Gait Details:  intermittitent assist with RW direction, cues for RW distance from self, step length and trunk extension  Stairs            Wheelchair Mobility    Modified Rankin (Stroke Patients Only)       Balance Overall balance assessment:  (pt has had multiple recent falls)           Standing balance-Leahy Scale: Fair Standing balance comment: able to static stand briefly without UE support                              Pertinent Vitals/Pain Pain Assessment: No/denies pain    Home Living Family/patient expects to be discharged to:: Private residence                      Prior Function                 Hand Dominance        Extremity/Trunk Assessment   Upper Extremity Assessment: Defer to OT evaluation           Lower Extremity Assessment: Generalized weakness         Communication      Cognition Arousal/Alertness: Awake/alert Behavior During Therapy: WFL for tasks assessed/performed Overall Cognitive Status: Within Functional Limits for tasks assessed                      General Comments      Exercises        Assessment/Plan    PT Assessment Patient needs  continued PT services  PT Diagnosis Difficulty walking   PT Problem List Decreased balance;Decreased activity tolerance;Decreased mobility  PT Treatment Interventions DME instruction;Gait training;Therapeutic activities;Therapeutic exercise;Patient/family education;Functional mobility training;Balance training   PT Goals (Current goals can be found in the Care Plan section) Acute Rehab PT Goals Patient Stated Goal: back to I-living PT Goal Formulation: With patient Time For Goal Achievement: 01/03/15 Potential to Achieve Goals: Good    Frequency Min 3X/week   Barriers to discharge        Co-evaluation               End of Session Equipment Utilized During Treatment: Gait belt Activity Tolerance: Patient tolerated treatment well Patient left:  with call bell/phone within reach;in bed;Other (comment) (with OT) Nurse Communication: Mobility status    Functional Assessment Tool Used: clinical judgement Functional Limitation: Mobility: Walking and moving around Mobility: Walking and Moving Around Current Status (A8341): At least 1 percent but less than 20 percent impaired, limited or restricted Mobility: Walking and Moving Around Goal Status [phone]): At least 1 percent but less than 20 percent impaired, limited or restricted    Time: 1353-1409 PT Time Calculation (min) (ACUTE ONLY): 16 min   Charges:   PT Evaluation $Initial PT Evaluation Tier I: 1 Procedure     PT G Codes:   PT G-Codes **NOT FOR INPATIENT CLASS** Functional Assessment Tool Used: clinical judgement Functional Limitation: Mobility: Walking and moving around Mobility: Walking and Moving Around Current Status (L7989): At least 1 percent but less than 20 percent impaired, limited or restricted Mobility: Walking and Moving Around Goal Status [phone]): At least 1 percent but less than 20 percent impaired, limited or restricted    [facility] 12/27/2014, 2:15 PM

## 2014-12-27 NOTE — Progress Notes (Signed)
  Echocardiogram 2D Echocardiogram has been performed.  Beryle Beams 12/27/2014, 11:21 AM

## 2014-12-27 NOTE — Evaluation (Signed)
Occupational Therapy Evaluation Patient Details Name: Tamara Stephenson MRN: [id number] DOB: 1933-06-11 Today's Date: 12/27/2014    History of Present Illness 81 yo female adm with syncopal episode and hallucinations. past medical history of Back pain/surgery; Hypertension; Carotid stenosis; Colitis; DM;   Clinical Impression   Pt was admitted for the above.  At baseline, she lives in independent living, and is mod I, but she has had several falls. She will benefit from skilled OT to increase safety and independence with adls.  Pt requires min guard at this time.  Goals are for supervision level.      Follow Up Recommendations  Home health OT (pt adamently refuses "healthcare" section of living facility):  Would benefit from this to increase balance/safety if she was agreeable.   Equipment Recommendations  None recommended by OT    Recommendations for Other Services       Precautions / Restrictions Precautions Precautions: Fall Restrictions Weight Bearing Restrictions: No      Mobility Bed Mobility Overal bed mobility: Needs Assistance Bed Mobility: Supine to Sit;Sit to Supine     Supine to sit: Supervision Sit to supine: Min assist   General bed mobility comments: min to bring LEs into bed; pt states she "can do it" on her own but requires a lot of effort   Transfers Overall transfer level: Needs assistance Equipment used: Rolling walker (2 wheeled) Transfers: Sit to/from Stand Sit to Stand: Min guard         General transfer comment: Min guard with PT; just back to bed, did not stand again with OT    Balance Overall balance assessment:  (pt has had multiple recent falls)                                        ADL Overall ADL's : Needs assistance/impaired     Grooming: Set up;Sitting   Upper Body Bathing: Set up;Sitting   Lower Body Bathing: Min guard;Sit to/from stand   Upper Body Dressing : Set up;Sitting   Lower Body Dressing: Min  guard;Sit to/from stand                 General ADL Comments: did not perform transfer.  Pt has had several falls:  admitted after syncopal episode, but pt also reports dysequilibrium. When talking to her, she "parks" walker to side when using bathroom:  educated that she should turn walker with her and keep it around her body.       Vision     Perception     Praxis      Pertinent Vitals/Pain Pain Assessment: No/denies pain     Hand Dominance     Extremity/Trunk Assessment Upper Extremity Assessment Upper Extremity Assessment: RUE deficits/detail RUE Deficits / Details: difficulty lifting:  AROM 60 degrees shoulder flexion.  Educated to perform AROM from supine within pain-free tolerance.     Lower Extremity Assessment Lower Extremity Assessment: Generalized weakness       Communication Communication Communication: No difficulties   Cognition Arousal/Alertness: Awake/alert Behavior During Therapy: WFL for tasks assessed/performed Overall Cognitive Status: Within Functional Limits for tasks assessed                     General Comments       Exercises       Shoulder Instructions      Home Living Family/patient expects to be  discharged to:: Private residence (independent living; one meal delivered daily) Living Arrangements: Alone                 Bathroom Shower/Tub: Walk-in Psychologist, prison and probation services: Standard     Home Equipment: Environmental consultant - 2 wheels;Other (comment);Bedside commode;Wheelchair - manual          Prior Functioning/Environment Level of Independence: Independent with assistive device(s)             OT Diagnosis: Generalized weakness   OT Problem List: Decreased strength;Decreased activity tolerance;Decreased safety awareness;Impaired balance (sitting and/or standing)   OT Treatment/Interventions: Self-care/ADL training;DME and/or AE instruction;Patient/family education;Balance training    OT Goals(Current goals can be  found in the care plan section) Acute Rehab OT Goals Patient Stated Goal: back to I-living OT Goal Formulation: With patient Time For Goal Achievement: 01/10/15 Potential to Achieve Goals: Good ADL Goals Pt Will Transfer to Toilet: with supervision;ambulating;bedside commode Pt Will Perform Toileting - Clothing Manipulation and hygiene: with supervision;sit to/from stand Additional ADL Goal #1: pt will gather clothes at supervision level and complete ADL without supervision Additional ADL Goal #2: Pt will not need any safety cues with RW when ambulating to bathroom or to retrieve clothing  OT Frequency: Min 2X/week   Barriers to D/C:            Co-evaluation              End of Session    Activity Tolerance: Patient tolerated treatment well Patient left: in bed;with call bell/phone within reach;with bed alarm set   Time: 1358-1425 OT Time Calculation (min): 27 min Charges:  OT General Charges $OT Visit: 1 Procedure OT Evaluation $Initial OT Evaluation Tier I: 1 Procedure G-Codes: OT G-codes **NOT FOR INPATIENT CLASS** Functional Assessment Tool Used: clinical observation and judgment Functional Limitation: Self care Self Care Current Status (N3614): At least 1 percent but less than 20 percent impaired, limited or restricted Self Care Goal Status (E3154): At least 1 percent but less than 20 percent impaired, limited or restricted  [facility] 12/27/2014, 3:43 PM Lesle Chris, OTR/L [phone] 12/27/2014

## 2014-12-27 NOTE — Progress Notes (Signed)
Clinical Social Work  Consult received to assist with DC planning. ED CSW evaluated patient who desires to return to independent living at [facility]. If further needs arise, CSW can contact [place] at [phone] who can assist with making arrangements at facility.  CSW is signing off but available if further needs arise.  Sindy Messing,  Weekend Coverage [phone]

## 2014-12-27 NOTE — Progress Notes (Signed)
PROGRESS NOTE  Tamara Stephenson QZR:[id number] DOB: November 03, 1933 DOA: 12/26/2014 PCP: Curly Rim, MD  HPI/Recap of past 24 hours:  Feeling better, oriented x3, but mild intermittent confusion and unsteady gait per RN.  Assessment/Plan: Active Problems:   CKD (chronic kidney disease) stage 3, GFR 30-59 ml/min   Anemia, iron deficiency   Diabetes mellitus with renal manifestations, uncontrolled (HCC)   Hallucinations, visual   Syncope   Hypoglycemia   Hepatic encephalopathy (HCC)   Cirrhosis of liver with ascites (HCC)   Reactive airway disease  Syncope: resolved. patient does not remember, but she was found to be hypoglycemia with blood sugar in the 30's.    UTI: reported noticed urine color was dark, denies dysuria, no fever, no leukocytosis, but in the setting of diabetes and syncope will treat with abx (rocephin) culture pending.  Hepatic encephalopathy? Does has chronic elevated lft and cirrhosis, elevated ammonia, will start lactulose/ rifaximin.   ckd III, slightly elevated than baseline, close monitor, renal dosing meds. Treating uti.  Chronic bilateral lower extremity edema: though on exam significant edema and diffuse erythema, patient reported these has been much better than before, lower extremity doppler to r/o dvt,echo pending, will likely benefit from diuretics if renal function allows. Patient reported has compression stockings.  Insulin dependent diabetes: on ssi here, a1c pending  HTN; betablocker  Code Status: DNR  Family Communication: patient   Disposition Plan: maximize home health, patient does not want to go to snf due to two cats   Consultants:  none  Procedures:  none  Antibiotics:  rocephin   Objective: BP 137/57 mmHg  Pulse 68  Temp(Src) 98.9 F (37.2 C) (Oral)  Resp 20  Ht 5\' 6"  (1.676 m)  Wt 191 lb 12.8 oz (87 kg)  BMI 30.97 kg/m2  SpO2 95%  Intake/Output Summary (Last 24 hours) at 12/27/14 1147 Last data filed at  12/27/14 0936  Gross per 24 hour  Intake    840 ml  Output    400 ml  Net    440 ml   Filed Weights   12/26/14 2108  Weight: 191 lb 12.8 oz (87 kg)    Exam:   General:  NAD  Cardiovascular: RRR  Respiratory: CTABL  Abdomen: Soft/ND/NT, positive BS, small reducible ventral hernia, nontender.  Musculoskeletal: pitting Edema, diffuse erythema, few small blisters  Neuro: aaox3, slight confusion about situation, no focal deficit  Data Reviewed: Basic Metabolic Panel:  Recent Labs Lab 12/25/14 1602 12/26/14 1932 12/27/14 0136  NA 143 140 141  K 3.8 3.8 3.8  CL 108 105 108  CO2 32 28 29  GLUCOSE 91 145* 184*  BUN 17 19 21*  CREATININE 1.43* 1.50* 1.53*  CALCIUM 8.6* 8.7* 8.6*  MG  --   --  1.7  PHOS  --   --  3.5   Liver Function Tests:  Recent Labs Lab 12/25/14 1602 12/26/14 1932 12/27/14 0136  AST 30 35 31  ALT 12* 14 12*  ALKPHOS 169* 170* 149*  BILITOT 2.0* 1.6* 1.6*  PROT 7.6 7.2 6.6  ALBUMIN 2.4* 2.4* 2.2*   No results for input(s): LIPASE, AMYLASE in the last 168 hours.  Recent Labs Lab 12/26/14 1709 12/27/14 0136  AMMONIA 49* 72*   CBC:  Recent Labs Lab 12/25/14 1602 12/26/14 1932 12/27/14 0136  WBC 5.9 6.1 6.6  NEUTROABS 3.0 3.1  --   HGB 11.1* 11.2* 9.9*  HCT 33.8* 33.5* 29.9*  MCV 99.7 101.5* 100.0  PLT 213 208  198   Cardiac Enzymes:    Recent Labs Lab 12/26/14 1932 12/27/14 0136 12/27/14 0754  TROPONINI <0.03 <0.03 <0.03   BNP (last 3 results) No results for input(s): BNP in the last 8760 hours.  ProBNP (last 3 results) No results for input(s): PROBNP in the last 8760 hours.  CBG:  Recent Labs Lab 12/26/14 1556 12/26/14 2139 12/26/14 2353 12/27/14 0359 12/27/14 0744  GLUCAP 104* 141* 177* 151* 101*    Recent Results (from the past 240 hour(s))  MRSA PCR Screening     Status: None   Collection Time: 12/27/14  5:55 AM  Result Value Ref Range Status   MRSA by PCR NEGATIVE NEGATIVE Final    Comment:         The GeneXpert MRSA Assay (FDA approved for NASAL specimens only), is one component of a comprehensive MRSA colonization surveillance program. It is not intended to diagnose MRSA infection nor to guide or monitor treatment for MRSA infections.      Studies: Dg Chest 2 View  12/26/2014  CLINICAL DATA:  Productive cough, syncope. EXAM: CHEST  2 VIEW COMPARISON:  September 19, 2014. FINDINGS: Stable cardiomediastinal silhouette. No pneumothorax is noted. Minimal bilateral posterior pleural effusions are noted. Mild central pulmonary vascular congestion is noted with possible bilateral perihilar edema. Narrowing of the subacromial space is noted bilaterally consistent with rotator cuff injury. IMPRESSION: Mild central pulmonary vascular congestion is noted with possible bilateral perihilar edema. Minimal bilateral pleural effusions are noted. Electronically Signed   By: Marijo Conception, M.D.   On: 12/26/2014 21:07   Ct Head Wo Contrast  12/26/2014  CLINICAL DATA:  Syncope, hypoglycemia EXAM: CT HEAD WITHOUT CONTRAST TECHNIQUE: Contiguous axial images were obtained from the base of the skull through the vertex without intravenous contrast. COMPARISON:  11/05/2014 FINDINGS: Motion degraded images. No evidence of parenchymal hemorrhage or extra-axial fluid collection. No mass lesion, mass effect, or midline shift. No CT evidence of acute infarction. Subcortical white matter and periventricular small vessel ischemic changes. Intracranial atherosclerosis. Global cortical and central atrophy. Secondary ventricular prominence. The visualized paranasal sinuses are essentially clear. The mastoid air cells are unopacified. No evidence of calvarial fracture. IMPRESSION: Motion degraded images. No evidence of acute intracranial abnormality. Atrophy with small vessel ischemic changes. Electronically Signed   By: Julian Hy M.D.   On: 12/26/2014 20:52    Scheduled Meds: . acidophilus  1 capsule Oral Daily    . antiseptic oral rinse  7 mL Mouth Rinse q12n4p  . atenolol  50 mg Oral Daily  . buPROPion  300 mg Oral Daily  . cefTRIAXone (ROCEPHIN)  IV  1 g Intravenous Q24H  . chlorhexidine  15 mL Mouth Rinse BID  . famotidine  20 mg Oral Daily  . gabapentin  300 mg Oral QHS  . guaiFENesin  600 mg Oral BID  . insulin aspart  0-9 Units Subcutaneous 6 times per day  . mometasone-formoterol  2 puff Inhalation BID  . pravastatin  20 mg Oral q1800  . sodium chloride  3 mL Intravenous Q12H  . ursodiol  300 mg Oral TID    Continuous Infusions:    Time spent: 11mins  Jacori Mulrooney MD, PhD  [facility] Pager [phone]. If 7PM-7AM, please contact night-coverage at [email or web address], password [facility] 12/27/2014, 11:47 AM

## 2014-12-28 ENCOUNTER — Observation Stay (HOSPITAL_BASED_OUTPATIENT_CLINIC_OR_DEPARTMENT_OTHER): Payer: Medicare HMO

## 2014-12-28 DIAGNOSIS — R609 Edema, unspecified: Secondary | ICD-10-CM

## 2014-12-28 DIAGNOSIS — K7290 Hepatic failure, unspecified without coma: Secondary | ICD-10-CM | POA: Diagnosis not present

## 2014-12-28 DIAGNOSIS — R55 Syncope and collapse: Secondary | ICD-10-CM | POA: Diagnosis not present

## 2014-12-28 DIAGNOSIS — R600 Localized edema: Secondary | ICD-10-CM

## 2014-12-28 DIAGNOSIS — K7460 Unspecified cirrhosis of liver: Secondary | ICD-10-CM | POA: Diagnosis not present

## 2014-12-28 DIAGNOSIS — E119 Type 2 diabetes mellitus without complications: Secondary | ICD-10-CM | POA: Diagnosis not present

## 2014-12-28 LAB — LACTIC ACID, PLASMA: LACTIC ACID, VENOUS: 1.3 mmol/L (ref 0.5–2.0)

## 2014-12-28 LAB — GLUCOSE, CAPILLARY
GLUCOSE-CAPILLARY: 188 mg/dL — AB (ref 65–99)
GLUCOSE-CAPILLARY: 209 mg/dL — AB (ref 65–99)
GLUCOSE-CAPILLARY: 307 mg/dL — AB (ref 65–99)
Glucose-Capillary: 171 mg/dL — ABNORMAL HIGH (ref 65–99)
Glucose-Capillary: 177 mg/dL — ABNORMAL HIGH (ref 65–99)
Glucose-Capillary: 202 mg/dL — ABNORMAL HIGH (ref 65–99)

## 2014-12-28 LAB — CBC
HCT: 30 % — ABNORMAL LOW (ref 36.0–46.0)
HEMOGLOBIN: 10 g/dL — AB (ref 12.0–15.0)
MCH: 33.6 pg (ref 26.0–34.0)
MCHC: 33.3 g/dL (ref 30.0–36.0)
MCV: 100.7 fL — AB (ref 78.0–100.0)
Platelets: 210 10*3/uL (ref 150–400)
RBC: 2.98 MIL/uL — AB (ref 3.87–5.11)
RDW: 18 % — ABNORMAL HIGH (ref 11.5–15.5)
WBC: 5.8 10*3/uL (ref 4.0–10.5)

## 2014-12-28 LAB — URINALYSIS, ROUTINE W REFLEX MICROSCOPIC
BILIRUBIN URINE: NEGATIVE
Glucose, UA: NEGATIVE mg/dL
KETONES UR: NEGATIVE mg/dL
NITRITE: NEGATIVE
PROTEIN: NEGATIVE mg/dL
Specific Gravity, Urine: 1.015 (ref 1.005–1.030)
UROBILINOGEN UA: 1 mg/dL (ref 0.0–1.0)
pH: 6.5 (ref 5.0–8.0)

## 2014-12-28 LAB — URINE CULTURE

## 2014-12-28 LAB — HEPATITIS PANEL, ACUTE
HEP A IGM: NEGATIVE
Hep B C IgM: NEGATIVE
Hepatitis B Surface Ag: NEGATIVE

## 2014-12-28 LAB — COMPREHENSIVE METABOLIC PANEL
ALK PHOS: 149 U/L — AB (ref 38–126)
ALT: 12 U/L — AB (ref 14–54)
AST: 27 U/L (ref 15–41)
Albumin: 2.1 g/dL — ABNORMAL LOW (ref 3.5–5.0)
Anion gap: 6 (ref 5–15)
BUN: 22 mg/dL — AB (ref 6–20)
CALCIUM: 8.3 mg/dL — AB (ref 8.9–10.3)
CO2: 27 mmol/L (ref 22–32)
CREATININE: 1.6 mg/dL — AB (ref 0.44–1.00)
Chloride: 106 mmol/L (ref 101–111)
GFR, EST AFRICAN AMERICAN: 34 mL/min — AB (ref >60)
GFR, EST NON AFRICAN AMERICAN: 30 mL/min — AB (ref >60)
Glucose, Bld: 218 mg/dL — ABNORMAL HIGH (ref 65–99)
Potassium: 3.7 mmol/L (ref 3.5–5.1)
Sodium: 139 mmol/L (ref 135–145)
Total Bilirubin: 1 mg/dL (ref 0.3–1.2)
Total Protein: 6.6 g/dL (ref 6.5–8.1)

## 2014-12-28 LAB — URINE MICROSCOPIC-ADD ON

## 2014-12-28 LAB — AMMONIA: AMMONIA: 67 umol/L — AB (ref 9–35)

## 2014-12-28 MED ORDER — INSULIN DETEMIR 100 UNIT/ML ~~LOC~~ SOLN
10.0000 [IU] | Freq: Every day | SUBCUTANEOUS | Status: DC
  Administered 2014-12-28 – 2014-12-29 (×2): 10 [IU] via SUBCUTANEOUS
  Filled 2014-12-28 (×2): qty 0.1

## 2014-12-28 MED ORDER — INSULIN ASPART 100 UNIT/ML ~~LOC~~ SOLN
0.0000 [IU] | Freq: Three times a day (TID) | SUBCUTANEOUS | Status: DC
  Administered 2014-12-29: 2 [IU] via SUBCUTANEOUS
  Administered 2014-12-29 – 2014-12-30 (×3): 3 [IU] via SUBCUTANEOUS

## 2014-12-28 MED ORDER — FUROSEMIDE 40 MG PO TABS
40.0000 mg | ORAL_TABLET | Freq: Once | ORAL | Status: AC
  Administered 2014-12-28: 40 mg via ORAL
  Filled 2014-12-28: qty 1

## 2014-12-28 MED ORDER — CEPHALEXIN 500 MG PO CAPS
500.0000 mg | ORAL_CAPSULE | Freq: Two times a day (BID) | ORAL | Status: DC
  Administered 2014-12-28 – 2014-12-30 (×5): 500 mg via ORAL
  Filled 2014-12-28 (×5): qty 1

## 2014-12-28 NOTE — Progress Notes (Signed)
*  PRELIMINARY RESULTS* Vascular Ultrasound Lower extremity venous duplex has been completed.  Preliminary findings: no evidence of DVT.  Landry Mellow, RDMS, RVT  12/28/2014, 8:52 AM

## 2014-12-28 NOTE — Progress Notes (Signed)
PROGRESS NOTE  Tamara Stephenson ZOX:[id number] DOB: Nov 04, 1933 DOA: 12/26/2014 PCP: Curly Rim, MD  HPI/Recap of past 24 hours:  oriented x3, reported does not like novolog, reported legs are weeping  Assessment/Plan: Active Problems:   CKD (chronic kidney disease) stage 3, GFR 30-59 ml/min   Anemia, iron deficiency   Diabetes mellitus with renal manifestations, uncontrolled (HCC)   Hallucinations, visual   Syncope   Hypoglycemia   Hepatic encephalopathy (HCC)   Cirrhosis of liver with ascites (HCC)   Reactive airway disease   Edema   Insulin dependent diabetes mellitus ([place])  Syncope: resolved. patient does not remember the event, but she was found to be hypoglycemia with blood sugar in the 30's.  No arrhythmia on tele.  UTI: reported noticed urine color was dark, denies dysuria, no fever, no leukocytosis, but in the setting of diabetes and syncope will treat with abx (rocephin) culture pending, overall improving, change to oral keflex.  Hepatic encephalopathy? Does has chronic elevated lft and cirrhosis, elevated ammonia, started lactulose/ rifaximin.   ckd III, slightly elevated than baseline, close monitor, renal dosing meds. Treating uti.  Chronic bilateral lower extremity edema:  on exam significant edema and diffuse erythema, patient reported these has been much better than before, lower extremity doppler no DVT. echo LVEF 35-40%, will start diuretics and monitor cr. Patient reported has compression stockings.  Insulin dependent diabetes: on ssi here, restart levemir, a1c pending  HTN; betablocker  Code Status: DNR  Family Communication: patient   Disposition Plan: maximize home health, patient does not want to go to snf due to two cats   Consultants:  none  Procedures:  none  Antibiotics:  Rocephin from admission to 10/23  Keflex from 10/23   Objective: BP 160/75 mmHg  Pulse 66  Temp(Src) 98.3 F (36.8 C) (Oral)  Resp 18  Ht 5\' 6"  (1.676 m)   Wt 191 lb 12.8 oz (87 kg)  BMI 30.97 kg/m2  SpO2 94%  Intake/Output Summary (Last 24 hours) at 12/28/14 1234 Last data filed at 12/28/14 0740  Gross per 24 hour  Intake    890 ml  Output   1300 ml  Net   -410 ml   Filed Weights   12/26/14 2108  Weight: 191 lb 12.8 oz (87 kg)    Exam:   General:  NAD  Cardiovascular: RRR  Respiratory: CTABL  Abdomen: Soft/ND/NT, positive BS, small reducible ventral hernia, nontender.  Musculoskeletal: pitting Edema, diffuse erythema, few small blisters  Neuro: aaox3, slight confusion seems has resolved, no focal deficit  Data Reviewed: Basic Metabolic Panel:  Recent Labs Lab 12/25/14 1602 12/26/14 1932 12/27/14 0136 12/27/14 1215 12/28/14 0525  NA 143 140 141 139 139  K 3.8 3.8 3.8 3.9 3.7  CL 108 105 108 106 106  CO2 32 28 29 26 27   GLUCOSE 91 145* 184* 166* 218*  BUN 17 19 21* 20 22*  CREATININE 1.43* 1.50* 1.53* 1.65* 1.60*  CALCIUM 8.6* 8.7* 8.6* 8.7* 8.3*  MG  --   --  1.7  --   --   PHOS  --   --  3.5  --   --    Liver Function Tests:  Recent Labs Lab 12/25/14 1602 12/26/14 1932 12/27/14 0136 12/27/14 1215 12/28/14 0525  AST 30 35 31 35 27  ALT 12* 14 12* 14 12*  ALKPHOS 169* 170* 149* 159* 149*  BILITOT 2.0* 1.6* 1.6* 1.4* 1.0  PROT 7.6 7.2 6.6 7.1 6.6  ALBUMIN 2.4*  2.4* 2.2* 2.3* 2.1*   No results for input(s): LIPASE, AMYLASE in the last 168 hours.  Recent Labs Lab 12/26/14 1709 12/27/14 0136 12/27/14 1215 12/28/14 0525  AMMONIA 49* 72* 76* 67*   CBC:  Recent Labs Lab 12/25/14 1602 12/26/14 1932 12/27/14 0136 12/28/14 0525  WBC 5.9 6.1 6.6 5.8  NEUTROABS 3.0 3.1  --   --   HGB 11.1* 11.2* 9.9* 10.0*  HCT 33.8* 33.5* 29.9* 30.0*  MCV 99.7 101.5* 100.0 100.7*  PLT 213 208 198 210   Cardiac Enzymes:    Recent Labs Lab 12/26/14 1932 12/27/14 0136 12/27/14 0754  TROPONINI <0.03 <0.03 <0.03   BNP (last 3 results) No results for input(s): BNP in the last 8760 hours.  ProBNP  (last 3 results) No results for input(s): PROBNP in the last 8760 hours.  CBG:  Recent Labs Lab 12/27/14 2010 12/28/14 0003 12/28/14 0407 12/28/14 0741 12/28/14 1201  GLUCAP 183* 177* 202* 188* 209*    Recent Results (from the past 240 hour(s))  Urine culture     Status: None   Collection Time: 12/26/14  8:27 PM  Result Value Ref Range Status   Specimen Description URINE, RANDOM  Final   Special Requests NONE  Final   Culture   Final    MULTIPLE SPECIES PRESENT, SUGGEST RECOLLECTION Performed at [facility]    Report Status 12/28/2014 FINAL  Final  MRSA PCR Screening     Status: None   Collection Time: 12/27/14  5:55 AM  Result Value Ref Range Status   MRSA by PCR NEGATIVE NEGATIVE Final    Comment:        The GeneXpert MRSA Assay (FDA approved for NASAL specimens only), is one component of a comprehensive MRSA colonization surveillance program. It is not intended to diagnose MRSA infection nor to guide or monitor treatment for MRSA infections.      Studies: No results found.  Scheduled Meds: . acidophilus  1 capsule Oral Daily  . antiseptic oral rinse  7 mL Mouth Rinse q12n4p  . atenolol  50 mg Oral Daily  . buPROPion  300 mg Oral Daily  . cephALEXin  500 mg Oral Q12H  . chlorhexidine  15 mL Mouth Rinse BID  . famotidine  20 mg Oral Daily  . furosemide  40 mg Oral Once  . gabapentin  300 mg Oral QHS  . guaiFENesin  600 mg Oral BID  . heparin subcutaneous  5,000 Units Subcutaneous 3 times per day  . insulin aspart  0-9 Units Subcutaneous 6 times per day  . insulin detemir  10 Units Subcutaneous QHS  . lactulose  10 g Oral BID  . mometasone-formoterol  2 puff Inhalation BID  . pravastatin  20 mg Oral q1800  . rifaximin  550 mg Oral BID  . sodium chloride  3 mL Intravenous Q12H  . ursodiol  300 mg Oral TID    Continuous Infusions:    Time spent: 51mins  Janaiah Vetrano MD, PhD  [facility] Pager [phone]. If 7PM-7AM, please contact  night-coverage at [email or web address], password [facility] 12/28/2014, 12:34 PM

## 2014-12-29 ENCOUNTER — Observation Stay (HOSPITAL_COMMUNITY): Payer: Medicare HMO

## 2014-12-29 DIAGNOSIS — R441 Visual hallucinations: Secondary | ICD-10-CM | POA: Diagnosis not present

## 2014-12-29 DIAGNOSIS — R609 Edema, unspecified: Secondary | ICD-10-CM | POA: Diagnosis not present

## 2014-12-29 DIAGNOSIS — K7460 Unspecified cirrhosis of liver: Secondary | ICD-10-CM | POA: Diagnosis not present

## 2014-12-29 DIAGNOSIS — E162 Hypoglycemia, unspecified: Secondary | ICD-10-CM | POA: Diagnosis not present

## 2014-12-29 LAB — AMMONIA: Ammonia: 45 umol/L — ABNORMAL HIGH (ref 9–35)

## 2014-12-29 LAB — COMPREHENSIVE METABOLIC PANEL
ALK PHOS: 144 U/L — AB (ref 38–126)
ALT: 13 U/L — ABNORMAL LOW (ref 14–54)
ANION GAP: 6 (ref 5–15)
AST: 31 U/L (ref 15–41)
Albumin: 2.2 g/dL — ABNORMAL LOW (ref 3.5–5.0)
BILIRUBIN TOTAL: 1.1 mg/dL (ref 0.3–1.2)
BUN: 21 mg/dL — ABNORMAL HIGH (ref 6–20)
CALCIUM: 8.2 mg/dL — AB (ref 8.9–10.3)
CO2: 29 mmol/L (ref 22–32)
Chloride: 105 mmol/L (ref 101–111)
Creatinine, Ser: 1.34 mg/dL — ABNORMAL HIGH (ref 0.44–1.00)
GFR calc non Af Amer: 37 mL/min — ABNORMAL LOW (ref >60)
GFR, EST AFRICAN AMERICAN: 42 mL/min — AB (ref >60)
Glucose, Bld: 190 mg/dL — ABNORMAL HIGH (ref 65–99)
Potassium: 3.2 mmol/L — ABNORMAL LOW (ref 3.5–5.1)
SODIUM: 140 mmol/L (ref 135–145)
TOTAL PROTEIN: 6.6 g/dL (ref 6.5–8.1)

## 2014-12-29 LAB — CBC
HEMATOCRIT: 30.8 % — AB (ref 36.0–46.0)
HEMOGLOBIN: 10.1 g/dL — AB (ref 12.0–15.0)
MCH: 32.6 pg (ref 26.0–34.0)
MCHC: 32.8 g/dL (ref 30.0–36.0)
MCV: 99.4 fL (ref 78.0–100.0)
Platelets: 206 10*3/uL (ref 150–400)
RBC: 3.1 MIL/uL — ABNORMAL LOW (ref 3.87–5.11)
RDW: 17.7 % — ABNORMAL HIGH (ref 11.5–15.5)
WBC: 6.6 10*3/uL (ref 4.0–10.5)

## 2014-12-29 LAB — GLUCOSE, CAPILLARY
GLUCOSE-CAPILLARY: 151 mg/dL — AB (ref 65–99)
GLUCOSE-CAPILLARY: 131 mg/dL — AB (ref 65–99)
GLUCOSE-CAPILLARY: 177 mg/dL — AB (ref 65–99)
GLUCOSE-CAPILLARY: 195 mg/dL — AB (ref 65–99)

## 2014-12-29 LAB — HEMOGLOBIN A1C
Hgb A1c MFr Bld: 6.6 % — ABNORMAL HIGH (ref 4.8–5.6)
Mean Plasma Glucose: 143 mg/dL

## 2014-12-29 LAB — MAGNESIUM: MAGNESIUM: 1.6 mg/dL — AB (ref 1.7–2.4)

## 2014-12-29 MED ORDER — POTASSIUM CHLORIDE CRYS ER 20 MEQ PO TBCR
40.0000 meq | EXTENDED_RELEASE_TABLET | Freq: Once | ORAL | Status: AC
  Administered 2014-12-29: 40 meq via ORAL
  Filled 2014-12-29: qty 2

## 2014-12-29 MED ORDER — FUROSEMIDE 40 MG PO TABS
40.0000 mg | ORAL_TABLET | Freq: Every day | ORAL | Status: DC
  Administered 2014-12-30: 40 mg via ORAL
  Filled 2014-12-29: qty 1

## 2014-12-29 NOTE — Progress Notes (Signed)
PROGRESS NOTE  Tamara Stephenson WFU:[id number] DOB: 04-Sep-1933 DOA: 12/26/2014 PCP: Curly Rim, MD  HPI/Recap of past 24 hours:  oriented x3, reported legs less swallow, stop weeping, some nonproductive cough, reported has been feeling DOE for a while.   Assessment/Plan: Active Problems:   CKD (chronic kidney disease) stage 3, GFR 30-59 ml/min   Anemia, iron deficiency   Diabetes mellitus with renal manifestations, uncontrolled (HCC)   Hallucinations, visual   Syncope   Hypoglycemia   Hepatic encephalopathy (HCC)   Cirrhosis of liver with ascites (HCC)   Reactive airway disease   Edema   Insulin dependent diabetes mellitus ([place])  Syncope: resolved. patient does not remember the event, but she was found to be hypoglycemia with blood sugar in the 30's.  No arrhythmia on tele.  UTI: reported noticed urine color was dark, denies dysuria, no fever, no leukocytosis, but in the setting of diabetes and syncope will treat with abx (rocephin) culture pending, overall improving, change to oral keflex.  Hallucination: patient improved, admit that she has been hallucinating for the past four weeks.   Hepatic encephalopathy? Does has chronic elevated lft and cirrhosis, elevated ammonia, started lactulose/ rifaximin.   ckd III, slightly elevated than baseline, close monitor, renal dosing meds. Treating uti.  Chronic bilateral lower extremity edema:  on exam significant edema and diffuse erythema, patient reported these has been much better than before, lower extremity doppler no DVT. echo LVEF 35-40%, will start diuretics and monitor cr. Patient reported has compression stockings.  Systolic chf exacerbation: improving with lasix. Repeat cxr.   Insulin dependent diabetes: on ssi here, restart levemir, a1c 6.6.  HTN; betablocker  Code Status: DNR  Family Communication: patient   Disposition Plan: maximize home health, patient does not want to go to snf due to two  cats   Consultants:  none  Procedures:  none  Antibiotics:  Rocephin from admission to 10/23  Keflex from 10/23   Objective: BP 136/55 mmHg  Pulse 68  Temp(Src) 98.2 F (36.8 C) (Oral)  Resp 18  Ht 5\' 6"  (1.676 m)  Wt 191 lb 12.8 oz (87 kg)  BMI 30.97 kg/m2  SpO2 96%  Intake/Output Summary (Last 24 hours) at 12/29/14 1442 Last data filed at 12/29/14 1300  Gross per 24 hour  Intake    600 ml  Output   5151 ml  Net  -4551 ml   Filed Weights   12/26/14 2108  Weight: 191 lb 12.8 oz (87 kg)    Exam:   General:  NAD  Cardiovascular: RRR  Respiratory: CTABL  Abdomen: Soft/ND/NT, positive BS, small reducible ventral hernia, nontender.  Musculoskeletal: subsiding Edema,  Less erythema, less blisters  Neuro: aaox3, slight confusion seems has resolved, no hallucination, no focal deficit  Data Reviewed: Basic Metabolic Panel:  Recent Labs Lab 12/26/14 1932 12/27/14 0136 12/27/14 1215 12/28/14 0525 12/29/14 0600  NA 140 141 139 139 140  K 3.8 3.8 3.9 3.7 3.2*  CL 105 108 106 106 105  CO2 28 29 26 27 29   GLUCOSE 145* 184* 166* 218* 190*  BUN 19 21* 20 22* 21*  CREATININE 1.50* 1.53* 1.65* 1.60* 1.34*  CALCIUM 8.7* 8.6* 8.7* 8.3* 8.2*  MG  --  1.7  --   --  1.6*  PHOS  --  3.5  --   --   --    Liver Function Tests:  Recent Labs Lab 12/26/14 1932 12/27/14 0136 12/27/14 1215 12/28/14 0525 12/29/14 0600  AST 35 31  35 27 31  ALT 14 12* 14 12* 13*  ALKPHOS 170* 149* 159* 149* 144*  BILITOT 1.6* 1.6* 1.4* 1.0 1.1  PROT 7.2 6.6 7.1 6.6 6.6  ALBUMIN 2.4* 2.2* 2.3* 2.1* 2.2*   No results for input(s): LIPASE, AMYLASE in the last 168 hours.  Recent Labs Lab 12/26/14 1709 12/27/14 0136 12/27/14 1215 12/28/14 0525 12/29/14 0600  AMMONIA 49* 72* 76* 67* 45*   CBC:  Recent Labs Lab 12/25/14 1602 12/26/14 1932 12/27/14 0136 12/28/14 0525 12/29/14 0600  WBC 5.9 6.1 6.6 5.8 6.6  NEUTROABS 3.0 3.1  --   --   --   HGB 11.1* 11.2* 9.9*  10.0* 10.1*  HCT 33.8* 33.5* 29.9* 30.0* 30.8*  MCV 99.7 101.5* 100.0 100.7* 99.4  PLT 213 208 198 210 206   Cardiac Enzymes:    Recent Labs Lab 12/26/14 1932 12/27/14 0136 12/27/14 0754  TROPONINI <0.03 <0.03 <0.03   BNP (last 3 results) No results for input(s): BNP in the last 8760 hours.  ProBNP (last 3 results) No results for input(s): PROBNP in the last 8760 hours.  CBG:  Recent Labs Lab 12/28/14 1201 12/28/14 1617 12/28/14 2017 12/29/14 0737 12/29/14 1154  GLUCAP 209* 307* 171* 151* 131*    Recent Results (from the past 240 hour(s))  Urine culture     Status: None   Collection Time: 12/26/14  8:27 PM  Result Value Ref Range Status   Specimen Description URINE, RANDOM  Final   Special Requests NONE  Final   Culture   Final    MULTIPLE SPECIES PRESENT, SUGGEST RECOLLECTION Performed at [facility]    Report Status 12/28/2014 FINAL  Final  MRSA PCR Screening     Status: None   Collection Time: 12/27/14  5:55 AM  Result Value Ref Range Status   MRSA by PCR NEGATIVE NEGATIVE Final    Comment:        The GeneXpert MRSA Assay (FDA approved for NASAL specimens only), is one component of a comprehensive MRSA colonization surveillance program. It is not intended to diagnose MRSA infection nor to guide or monitor treatment for MRSA infections.      Studies: No results found.  Scheduled Meds: . acidophilus  1 capsule Oral Daily  . antiseptic oral rinse  7 mL Mouth Rinse q12n4p  . atenolol  50 mg Oral Daily  . buPROPion  300 mg Oral Daily  . cephALEXin  500 mg Oral Q12H  . chlorhexidine  15 mL Mouth Rinse BID  . famotidine  20 mg Oral Daily  . gabapentin  300 mg Oral QHS  . guaiFENesin  600 mg Oral BID  . heparin subcutaneous  5,000 Units Subcutaneous 3 times per day  . insulin aspart  0-15 Units Subcutaneous TID WC  . insulin detemir  10 Units Subcutaneous QHS  . lactulose  10 g Oral BID  . mometasone-formoterol  2 puff Inhalation BID  .  potassium chloride  40 mEq Oral Once  . pravastatin  20 mg Oral q1800  . rifaximin  550 mg Oral BID  . sodium chloride  3 mL Intravenous Q12H  . ursodiol  300 mg Oral TID    Continuous Infusions:    Time spent: 6mins  Daniell Paradise MD, PhD  [facility] Pager [phone]. If 7PM-7AM, please contact night-coverage at [email or web address], password [email or web address] 12/29/2014, 2:42 PM

## 2014-12-29 NOTE — Care Management Note (Signed)
Case Management Note  Patient Details  Name: Tamara Stephenson MRN: [id number] Date of Birth: 1933/08/28  Subjective/Objective:  Per Carmelina Paddock @ [facility] tel#[phone] spoke to patient & patient now agrees to SNF level @ [facility]. CM also spoke to patient-now in agreement to SNF, she understands that the cats will not go there with her.CSW following for auth.                  Action/Plan:d/c SNF.   Expected Discharge Date:   (unknown)               Expected Discharge Plan:     In-House Referral:     Discharge planning Services     Post Acute Care Choice:    Choice offered to:     DME Arranged:    DME Agency:     HH Arranged:    [place] Agency:     Status of Service:     Medicare Important Message Given:    Date Medicare IM Given:    Medicare IM give by:    Date Additional Medicare IM Given:    Additional Medicare Important Message give by:     If discussed at [place] of Stay Meetings, dates discussed:    Additional Comments:  Dessa Phi, RN 12/29/2014, 12:13 PM

## 2014-12-29 NOTE — Progress Notes (Signed)
Physical Therapy Treatment Patient Details Name: Tamara Stephenson MRN: [id number] DOB: [birth year] Today's Date: 12/29/2014    History of Present Illness 81 yo female adm with syncopal episode and hallucinations. past medical history of Back pain/surgery; Hypertension; Carotid stenosis; Colitis; DM;    PT Comments    Assisted patient out of bed and walked down hallway; put patient back into recliner; noted R foot drop/toe drag during ambulation; pt. Notified SPTA of "drooping" while walking x2, as if she felt she was going to fall - SPTA noted no weight shift or "drooping" - pt. Self corrected; plans to return to [place]   Follow Up Recommendations  Home health PT     Equipment Recommendations  None recommended by PT    Recommendations for Other Services       Precautions / Restrictions Precautions Precautions: Fall Restrictions Weight Bearing Restrictions: No    Mobility  Bed Mobility Overal bed mobility: Modified Independent Bed Mobility: Sidelying to Sit   Sidelying to sit: Supervision Supine to sit: Supervision Sit to supine: Supervision      Transfers Overall transfer level: Needs assistance Equipment used: Rolling walker (2 wheeled) Transfers: Sit to/from Stand Sit to Stand: Min guard         General transfer comment: 25% VC for proper hand placement upon descending to sitting; 25% VC for gradual descent as patient was quick, decreasing safety and awareness of seat surface  Ambulation/Gait Ambulation/Gait assistance: Min guard Ambulation Distance (Feet): 80 Feet Assistive device: Rolling walker (2 wheeled) Gait Pattern/deviations: Decreased step length - right;Decreased stance time - right;Decreased stride length;Decreased dorsiflexion - right;Decreased weight shift to right;Drifts right/left;Trunk flexed     General Gait Details: 75% VC for proper walker to self distance as patient tended to push too far away from RW; 50% VC for  dorsiflex of R foot to decrease toe drag and increase safety   Stairs            Wheelchair Mobility    Modified Rankin (Stroke Patients Only)       Balance                                    Cognition Arousal/Alertness: Awake/alert Behavior During Therapy: WFL for tasks assessed/performed Overall Cognitive Status: Impaired/Different from baseline Area of Impairment: Safety/judgement                    Exercises      General Comments        Pertinent Vitals/Pain Pain Assessment: No/denies pain Pain Score: 0-No pain Faces Pain Scale: Hurts a little bit Pain Location: back "uncomfortable"  Pain Descriptors / Indicators: Sore Pain Intervention(s): Repositioned    Home Living                      Prior Function            PT Goals (current goals can now be found in the care plan section) Acute Rehab PT Goals Patient Stated Goal: back to home (I-living) PT Goal Formulation: With patient Time For Goal Achievement: 01/03/15 Potential to Achieve Goals: Good Progress towards PT goals: Progressing toward goals    Frequency  Min 3X/week    PT Plan Current plan remains appropriate    Co-evaluation             End of Session Equipment Utilized During Treatment: Gait belt  Activity Tolerance: Patient tolerated treatment well Patient left: in chair;with call bell/phone within reach;with chair alarm set     Time: 4580-9983 PT Time Calculation (min) (ACUTE ONLY): 24 min  Charges:  $Gait Training: 8-22 mins $Therapeutic Activity: 8-22 mins                    G Codes:      Denna Haggard PTA Student 12/29/2014, 12:55 PM   Reviewed and agree with above Rica Koyanagi  PTA [facility]  Acute  Rehab Pager      [phone]

## 2014-12-29 NOTE — Progress Notes (Signed)
Pt has upper airway exp wh in neck- BBS clr

## 2014-12-29 NOTE — Progress Notes (Signed)
Occupational Therapy Treatment Patient Details Name: Tamara Stephenson MRN: [id number] DOB: Oct 05, 1933 Today's Date: 12/29/2014    History of present illness 81 yo female adm with syncopal episode and hallucinations. past medical history of Back pain/surgery; Hypertension; Carotid stenosis; Colitis; DM;   OT comments  Pt up in room with walker with min assist for safety as pt is unsteady at times and also demonstrates decreased safety with the walker. She transferred into the bathroom to perform grooming task and required UE support on the sink to maintain standing balance. Educated on safe techniques to transport items from one room to the other (drape towels over walker). Pt states her legs feel weak and could "go out" at any time from under her. Feel she could benefit from rehab at her facility initially to increase her safety and independence with self care prior to return to her independent living apartment.  Will follow.     Follow Up Recommendations  SNF;Supervision/Assistance - 24 hour;Other (comment) (feel she can benefit from SNF but if pt refuses recommend HHOT at her ILF)    Equipment Recommendations  None recommended by OT    Recommendations for Other Services      Precautions / Restrictions Precautions Precautions: Fall Restrictions Weight Bearing Restrictions: No       Mobility Bed Mobility Overal bed mobility: Needs Assistance Bed Mobility: Supine to Sit;Sit to Supine     Supine to sit: Supervision Sit to supine: Supervision      Transfers Overall transfer level: Needs assistance Equipment used: Rolling walker (2 wheeled) Transfers: Sit to/from Stand Sit to Stand: Min guard         General transfer comment: cues for hand placement.    Balance                                   ADL       Grooming: Wash/dry face;Oral care;Minimal assistance;Standing                                 General ADL Comments: Pt up in room with  walker and requireds mod cues for safety as she tends to push the walker too far in front of her (doesnt stay inside of walker) and also tends to push walker to the side and doesnt keep it with her. She also needs cues for hand placement with functional transfers as she tends to want to pull up on the walker. Pt holds to the sink for support while grooming and pt states she has always needed to brace herself holding the sink. SHe admits to many frequent falls recently  including trying to go a short distance from her bed to bathroom. She states to OT when OT first assisted pt OOB that her "legs could give out" at any time and without  warning yet later in session states OT doesnt need to stand right there close to her. She doesnt want to go to rehab at her facility because she doesnt want to be away from her cats and in another room.       Vision                     Perception     Praxis      Cognition   Behavior During Therapy: [facility] for tasks assessed/performed Overall Cognitive Status: Impaired/Different from baseline  Area of Impairment: Safety/judgement                     Extremity/Trunk Assessment               Exercises     Shoulder Instructions       General Comments      Pertinent Vitals/ Pain       Pain Assessment: Faces Faces Pain Scale: Hurts a little bit Pain Location: back "uncomfortable"  Pain Descriptors / Indicators: Sore Pain Intervention(s): Repositioned  Home Living                                          Prior Functioning/Environment              Frequency Min 2X/week     Progress Toward Goals  OT Goals(current goals can now be found in the care plan section)  Progress towards OT goals: Progressing toward goals     Plan Discharge plan needs to be updated    Co-evaluation                 End of Session Equipment Utilized During Treatment: Rolling walker   Activity Tolerance Patient tolerated  treatment well   Patient Left in bed;with call bell/phone within reach;with bed alarm set   Nurse Communication          Time: 8592-9244 OT Time Calculation (min): 33 min  Charges: OT General Charges $OT Visit: 1 Procedure OT Treatments $Self Care/Home Management : 8-22 mins $Therapeutic Activity: 8-22 mins  Jules Schick  [phone] 12/29/2014, 10:58 AM

## 2014-12-29 NOTE — Care Management Note (Signed)
Case Management Note  Patient Details  Name: Tamara Stephenson MRN: [id number] Date of Birth: [birth year]  Subjective/Objective:   Per CSW patient seen by PT-recc HHPT.Arville Go provides [place] [facility] [employer]. Patient in agreement to returning back to [facility] w/HHC.Await HHRN/PT/OT, face to face orders.                 Action/Plan:d/c [place]ep [place].   Expected Discharge Date:   (unknown)               Expected Discharge Plan:  [place]  In-House Referral:     Discharge planning Services  CM Consult  Post Acute Care Choice:    Choice offered to:  Patient  DME Arranged:    DME Agency:     HH Arranged:    HH Agency:  [place]  Status of Service:     Medicare Important Message Given:    Date Medicare IM Given:    Medicare IM give by:    Date Additional Medicare IM Given:    Additional Medicare Important Message give by:     If discussed at [place] of Stay Meetings, dates discussed:    Additional Comments:  Dessa Phi, RN 12/29/2014, 12:35 PM

## 2014-12-30 DIAGNOSIS — N183 Chronic kidney disease, stage 3 (moderate): Secondary | ICD-10-CM | POA: Diagnosis not present

## 2014-12-30 DIAGNOSIS — E162 Hypoglycemia, unspecified: Secondary | ICD-10-CM | POA: Diagnosis not present

## 2014-12-30 DIAGNOSIS — R441 Visual hallucinations: Secondary | ICD-10-CM | POA: Diagnosis not present

## 2014-12-30 DIAGNOSIS — K7460 Unspecified cirrhosis of liver: Secondary | ICD-10-CM | POA: Diagnosis not present

## 2014-12-30 DIAGNOSIS — I5023 Acute on chronic systolic (congestive) heart failure: Secondary | ICD-10-CM

## 2014-12-30 LAB — GLUCOSE, CAPILLARY
GLUCOSE-CAPILLARY: 101 mg/dL — AB (ref 65–99)
Glucose-Capillary: 162 mg/dL — ABNORMAL HIGH (ref 65–99)

## 2014-12-30 LAB — COMPREHENSIVE METABOLIC PANEL
ALBUMIN: 2.2 g/dL — AB (ref 3.5–5.0)
ALT: 13 U/L — AB (ref 14–54)
AST: 32 U/L (ref 15–41)
Alkaline Phosphatase: 146 U/L — ABNORMAL HIGH (ref 38–126)
Anion gap: 6 (ref 5–15)
BUN: 17 mg/dL (ref 6–20)
CHLORIDE: 105 mmol/L (ref 101–111)
CO2: 28 mmol/L (ref 22–32)
Calcium: 8.3 mg/dL — ABNORMAL LOW (ref 8.9–10.3)
Creatinine, Ser: 1.26 mg/dL — ABNORMAL HIGH (ref 0.44–1.00)
GFR calc Af Amer: 46 mL/min — ABNORMAL LOW (ref >60)
GFR, EST NON AFRICAN AMERICAN: 39 mL/min — AB (ref >60)
GLUCOSE: 117 mg/dL — AB (ref 65–99)
POTASSIUM: 3.6 mmol/L (ref 3.5–5.1)
Sodium: 139 mmol/L (ref 135–145)
Total Bilirubin: 1.4 mg/dL — ABNORMAL HIGH (ref 0.3–1.2)
Total Protein: 6.8 g/dL (ref 6.5–8.1)

## 2014-12-30 LAB — LIPID PANEL
Cholesterol: 96 mg/dL (ref 0–200)
HDL: 21 mg/dL — AB (ref >40)
LDL CALC: 62 mg/dL (ref 0–99)
TRIGLYCERIDES: 67 mg/dL (ref <150)
Total CHOL/HDL Ratio: 4.6 RATIO
VLDL: 13 mg/dL (ref 0–40)

## 2014-12-30 LAB — CBC
HEMATOCRIT: 30.1 % — AB (ref 36.0–46.0)
Hemoglobin: 10.2 g/dL — ABNORMAL LOW (ref 12.0–15.0)
MCH: 34 pg (ref 26.0–34.0)
MCHC: 33.9 g/dL (ref 30.0–36.0)
MCV: 100.3 fL — AB (ref 78.0–100.0)
PLATELETS: 208 10*3/uL (ref 150–400)
RBC: 3 MIL/uL — AB (ref 3.87–5.11)
RDW: 17.6 % — AB (ref 11.5–15.5)
WBC: 7.1 10*3/uL (ref 4.0–10.5)

## 2014-12-30 LAB — VITAMIN B12: Vitamin B-12: 1064 pg/mL — ABNORMAL HIGH (ref 180–914)

## 2014-12-30 LAB — MAGNESIUM: Magnesium: 1.7 mg/dL (ref 1.7–2.4)

## 2014-12-30 LAB — AMMONIA: AMMONIA: 41 umol/L — AB (ref 9–35)

## 2014-12-30 MED ORDER — CEPHALEXIN 500 MG PO CAPS: 500.0000 mg | ORAL_CAPSULE | Freq: Two times a day (BID) | ORAL | Status: DC

## 2014-12-30 MED ORDER — INSULIN DETEMIR 100 UNIT/ML ~~LOC~~ SOLN: 8.0000 [IU] | Freq: Every day | SUBCUTANEOUS | Status: DC

## 2014-12-30 MED ORDER — CARVEDILOL 6.25 MG PO TABS
6.2500 mg | ORAL_TABLET | Freq: Two times a day (BID) | ORAL | Status: DC
  Administered 2014-12-30: 6.25 mg via ORAL
  Filled 2014-12-30: qty 1

## 2014-12-30 MED ORDER — POTASSIUM CHLORIDE CRYS ER 20 MEQ PO TBCR
40.0000 meq | EXTENDED_RELEASE_TABLET | Freq: Every day | ORAL | Status: DC
  Administered 2014-12-30: 40 meq via ORAL
  Filled 2014-12-30: qty 2

## 2014-12-30 MED ORDER — FUROSEMIDE 40 MG PO TABS: 40.0000 mg | ORAL_TABLET | Freq: Every day | ORAL | Status: AC

## 2014-12-30 MED ORDER — CARVEDILOL 6.25 MG PO TABS: 6.2500 mg | ORAL_TABLET | Freq: Two times a day (BID) | ORAL | Status: AC

## 2014-12-30 MED ORDER — RISAQUAD PO CAPS: 1.0000 | ORAL_CAPSULE | Freq: Every day | ORAL | Status: AC

## 2014-12-30 MED ORDER — POTASSIUM CHLORIDE CRYS ER 20 MEQ PO TBCR: 40.0000 meq | EXTENDED_RELEASE_TABLET | Freq: Every day | ORAL | Status: AC

## 2014-12-30 MED ORDER — LACTULOSE 10 GM/15ML PO SOLN: 10.0000 g | Freq: Two times a day (BID) | ORAL | Status: DC

## 2014-12-30 NOTE — Discharge Summary (Signed)
Discharge Summary  Tamara Stephenson LZJ:[id number] DOB: October 18, 1933  PCP: Curly Rim, MD  Admit date: 12/26/2014 Discharge date: 12/30/2014  Time spent: >76mins  Recommendations for Outpatient Follow-up:  1. F/u with PMD within a week for hospital discharge follow up, repeat cmp/mag at discharge, continue adjust diuretics and bp meds, consider referral to cardiology for systolic chf.  Discharge Diagnoses:  Active Hospital Problems   Diagnosis Date Noted  . Edema   . Insulin dependent diabetes mellitus ([place])   . Hallucinations, visual 12/26/2014  . Syncope 12/26/2014  . Hypoglycemia 12/26/2014  . Hepatic encephalopathy ([place]) 12/26/2014  . Cirrhosis of liver with ascites ([place]) 12/26/2014  . Reactive airway disease 12/26/2014  . Diabetes mellitus with renal manifestations, uncontrolled ([place]) 09/16/2014  . CKD (chronic kidney disease) stage 3, GFR 30-59 ml/min 09/16/2014  . Anemia, iron deficiency 09/16/2014    Resolved Hospital Problems   Diagnosis Date Noted Date Resolved  No resolved problems to display.    Discharge Condition: stable  Diet recommendation: heart healthy/carb modified  Filed Weights   12/26/14 2108  Weight: 191 lb 12.8 oz (87 kg)    History of present illness:  Tamara Stephenson is a 81 y.o. female   has a past medical history of Back pain; Hypertension; Carotid stenosis; Colitis; and Diabetes mellitus without complication ([place]).   Presented with  Patient was brought in from [facility] with syncopal episode .At the time she was found to have CBG of 32. She was treated with glucose and hypoglycemia has improved patient has known history of diabetes on insulin and glimepiride. Patient has been endorsing decreased by mouth intake. Also note on October 20 the patient was seen in emergency department for abdominal pain CT scan done and showed evidence of cirrhosis and moderate ascites patient has history of fatty liver disease ascites is a new finding since  2015. Otherwise there was findings of chronic hernias small pleural effusion pulse or gallbladder and left nephrolithiasis in the left lower pole nonobstructing. Patient was found to a mildly elevated ammonia level up to49 Syncope was thought to be secondary to hypoglycemia. The plan was for patient to be discharged back to [place]. Patient currently resides in independent living. The independently with supervisor was contacted but was concerned given patient has been having history of hallucinations opacity months it has been getting worse he felt uncomfortable accepted her to independent living.  Of note patient reports frequent falls she has hit her head a few weeks ago but have not been evaluated for that since. She endorses episodes of hallucinations where she seen people in her apartment will occasionally walk through walls patient had had similar episode in August which was thought to be secondary to dementia. At her baseline patient is sharp and able to take care of her own finances and reports occasional episode of these hallucinations associated with transient confusion.   Patient denies any fever no chills no nausea no vomiting no diarrhea no chest pain she has been having some wheezing for past 1 month patient reports still smoking and saying she is interested in quitting denies worsening shortness of breath cough.   Hospitalist was called for admission for syncope and elevated ammonia level progressive hallucinations  Hospital Course:  Active Problems:   CKD (chronic kidney disease) stage 3, GFR 30-59 ml/min   Anemia, iron deficiency   Diabetes mellitus with renal manifestations, uncontrolled (HCC)   Hallucinations, visual   Syncope   Hypoglycemia   Hepatic encephalopathy ([place])  Cirrhosis of liver with ascites (HCC)   Reactive airway disease   Edema   Insulin dependent diabetes mellitus ([place])  Syncope: resolved. patient does not remember the event, but she was found to  be hypoglycemia with blood sugar in the 30's. No arrhythmia on tele. Ct head negative for acute changes.   UTI: reported noticed urine color was dark, denies dysuria, no fever, no leukocytosis, but in the setting of diabetes and syncope will treat with abx (rocephin) culture unrevealing, overall improving, change to oral keflex.  Hallucination: patient improved, admit that she has been hallucinating for the past four weeks.  No hallucination in the hospital. Ct head wnl. tsh wnl.  Cirrhosis with ascites: No history of alcohol, negative hepatitis, suspect NASH vs chronic congestion from untreated CHF. Started lasix.  Hepatic encephalopathy? Does has chronic elevated lft and cirrhosis, elevated ammonia, started lactulose/ rifaximin. Improving, d/c rifaximin, continue lactulose. Possible liver function improvement with treating chf, may not need long term lactulose, pmd to continue monitor.   ckd III, cr improving at time of discharge (peaked at 1.65, down to 1.26 at discharge), renal dosing meds. Treating ?uti. Urine culture unrevealing. Continue keflex to finish course.  Chronic bilateral lower extremity edema with mild superficial blister and erythema, possible mild cellulitis:  on exam significant edema and diffuse erythema, patient reported these has been much better than before, lower extremity doppler no DVT. echo LVEF 35-40%, started lasix , cr improving at discharge. Patient reported has compression stockings. Keflex prescribed.  Systolic chf exacerbation: improving with lasix. Repeat cxr, reduced mild bilateral pleural effusion. Compare to echocardiogram obtained a year ago, the reduced EF is new, ? Etiology? From uncontrolled htn? Consider outpatient cardiology referral. Now on coreg and lasix, not a candidate for acei due to cr elevatio  Insulin dependent diabetes: on ssi here, restart levemir, a1c 6.6. Amaryl discontinued and levemir reduced due to hypoglycemia episodes.  Patient is  discharged on levemir 8units qhs, novolog 3units with meals ( patient reported only eat one meal a day at 1pm, then snack at 10pm). pmd to continue monitor blood sugar control.  HTN; bp meds adjusted, d/c atenolol, change to coreg due to  Reduced LVEF on echocardiogram, continue adjust bp meds.  FTT: refused snf, maximize home health  Code Status: DNR  Family Communication: patient   Disposition Plan: maximize home health, patient does not want to go to snf due to two cats   Consultants:  none  Procedures:  none  Antibiotics:  Rocephin from admission to 10/23  Keflex from 10/23  Discharge Exam: BP 177/64 mmHg  Pulse 73  Temp(Src) 98.2 F (36.8 C) (Oral)  Resp 20  Ht 5\' 6"  (1.676 m)  Wt 191 lb 12.8 oz (87 kg)  BMI 30.97 kg/m2  SpO2 95%   General: NAD  Cardiovascular: RRR  Respiratory: CTABL  Abdomen: Soft/ND/NT, positive BS, small reducible ventral hernia, nontender.  Musculoskeletal: subsiding Edema, Less erythema, less blisters  Neuro: aaox3, slight confusion seems has resolved, no hallucination, no focal deficit   Discharge Instructions You were cared for by a hospitalist during your hospital stay. If you have any questions about your discharge medications or the care you received while you were in the hospital after you are discharged, you can call the unit and asked to speak with the hospitalist on call if the hospitalist that took care of you is not available. Once you are discharged, your primary care physician will handle any further medical issues. Please note that  NO REFILLS for any discharge medications will be authorized once you are discharged, as it is imperative that you return to your primary care physician (or establish a relationship with a primary care physician if you do not have one) for your aftercare needs so that they can reassess your need for medications and monitor your lab values.  Discharge Instructions    Diet - low sodium heart  healthy    Complete by:  As directed   Low salt, low fat, carb modified, fluids restriction if edema.     Face-to-face encounter (required for Medicare/Medicaid patients)    Complete by:  As directed   I Weslynn Ke certify that this patient is under my care and that I, or a nurse practitioner or physician's assistant working with me, had a face-to-face encounter that meets the physician face-to-face encounter requirements with this patient on 12/29/2014. The encounter with the patient was in whole, or in part for the following medical condition(s) which is the primary reason for home health care (List medical condition): FTT  The encounter with the patient was in whole, or in part, for the following medical condition, which is the primary reason for home health care:  FTT  I certify that, based on my findings, the following services are medically necessary home health services:  Physical therapy  Reason for Medically Necessary Home Health Services:  Skilled Nursing- Change/Decline in Patient Status  My clinical findings support the need for the above services:  Cognitive impairments, dementia, or mental confusion  that make it unsafe to leave home  Further, I certify that my clinical findings support that this patient is homebound due to:  Unsafe ambulation due to balance issues     Home Health    Complete by:  As directed   To provide the following care/treatments:   PT OT RN       Increase activity slowly    Complete by:  As directed             Medication List    STOP taking these medications        atenolol 100 MG tablet  Commonly known as:  TENORMIN     glimepiride 4 MG tablet  Commonly known as:  AMARYL     ondansetron 4 MG tablet  Commonly known as:  ZOFRAN     oxyCODONE 5 MG immediate release tablet  Commonly known as:  Oxy IR/ROXICODONE      TAKE these medications        acetaminophen 500 MG tablet  Commonly known as:  TYLENOL  Take 500 mg by mouth every 6 (six)  hours as needed for moderate pain.     acidophilus Caps capsule  Take 1 capsule by mouth daily.     aspirin EC 81 MG EC tablet  Generic drug:  aspirin  Take 162 mg by mouth at bedtime. Swallow whole.     buPROPion 300 MG 24 hr tablet  Commonly known as:  WELLBUTRIN XL  Take 300 mg by mouth daily.     carvedilol 6.25 MG tablet  Commonly known as:  COREG  Take 1 tablet (6.25 mg total) by mouth 2 (two) times daily with a meal.     cephALEXin 500 MG capsule  Commonly known as:  KEFLEX  Take 1 capsule (500 mg total) by mouth 2 (two) times daily.     cholecalciferol 1000 UNITS tablet  Commonly known as:  VITAMIN D  Take 2,000 Units by mouth daily.  furosemide 40 MG tablet  Commonly known as:  LASIX  Take 1 tablet (40 mg total) by mouth daily.     gabapentin 300 MG capsule  Commonly known as:  NEURONTIN  Take 1 capsule (300 mg total) by mouth at bedtime.     insulin aspart 100 UNIT/ML injection  Commonly known as:  novoLOG  Inject 3 Units into the skin 3 (three) times daily with meals.     insulin detemir 100 UNIT/ML injection  Commonly known as:  LEVEMIR  Inject 0.08 mLs (8 Units total) into the skin at bedtime.     lactulose 10 GM/15ML solution  Commonly known as:  CHRONULAC  Take 15 mLs (10 g total) by mouth 2 (two) times daily.     lovastatin 20 MG tablet  Commonly known as:  MEVACOR  Take 20 mg by mouth at bedtime.     potassium chloride SA 20 MEQ tablet  Commonly known as:  K-DUR,KLOR-CON  Take 2 tablets (40 mEq total) by mouth daily.     ranitidine 300 MG tablet  Commonly known as:  ZANTAC  Take 300 mg by mouth at bedtime.     ursodiol 300 MG capsule  Commonly known as:  ACTIGALL  Take 300 mg by mouth 3 (three) times daily.     Vitamin B 12 100 MCG Lozg  Take 1 tablet by mouth every other day.       Allergies  Allergen Reactions  . Peanut-Containing Drug Products Nausea And Vomiting       Follow-up Information    Follow up with  CORRINGTON,KIP A, MD. Schedule an appointment as soon as possible for a visit in 1 week.   Specialty:  Family Medicine   Why:  hospital discharge follow up, repeat cmp/mag at follow up, adjust lasix dose. consider referral to cardiology for systolic heart failure.   Contact information:   [place] [address] [place] [address] [phone]       Go to [facility] DEPT.   Specialty:  Emergency Medicine   Why:  If symptoms worsen   Contact information:   [place] [id number] [place] [phone]       The results of significant diagnostics from this hospitalization (including imaging, microbiology, ancillary and laboratory) are listed below for reference.    Significant Diagnostic Studies: Ct Abdomen Pelvis Wo Contrast  12/25/2014  CLINICAL DATA:  Generalized abdominal pain EXAM: CT ABDOMEN AND PELVIS WITHOUT CONTRAST TECHNIQUE: Multidetector CT imaging of the abdomen and pelvis was performed following the standard protocol without IV contrast. COMPARISON:  10/12/2013 FINDINGS: Lower chest and abdominal wall: Small bilateral pleural effusion which is new. Chronic umbilical and supraumbilical hernias containing fat and now with ascitic fluid. Hepatobiliary: Cirrhotic liver morphology without gross mass lesion. There is new moderate ascites. No splenomegaly.Partial but diffuse mural calcification of the gallbladder wall without typical layering stone. No gallbladder inflammation or over distension. Pancreas: Unremarkable. Spleen: Unremarkable. Adrenals/Urinary Tract: Stable low-density lobulation of the adrenals, left more than right, consistent with adenomas. Left lower pole 3 mm calculus. No hydronephrosis. Unremarkable bladder. Reproductive:No pathologic findings. Stomach/Bowel:  No obstruction. No appendicitis. Vascular/Lymphatic: No acute vascular abnormality. Enlarged deep liver drainage lymph nodes, nonspecific and  usually reactive in the setting of cirrhosis. Musculoskeletal: Severe and diffuse disc degeneration and facet arthropathy with lumbar levoscoliosis. Multilevel lumbar laminectomy. No acute osseous finding. IMPRESSION: 1. Cirrhosis with moderate ascites. Ascites has developed since 2015. 2. The questioned  umbilical and supraumbilical hernias are chronic and stable other than new ascitic fluid. 3. Small pleural effusions. 4. Porcelain gallbladder. 5. Left nephrolithiasis. Electronically Signed   By: Monte Fantasia M.D.   On: 12/25/2014 17:15   Dg Chest 2 View  12/29/2014  CLINICAL DATA:  Cough, followup, hypertension, diabetes mellitus, smoker EXAM: CHEST  2 VIEW COMPARISON:  12/26/2014 FINDINGS: Minimal enlargement of cardiac silhouette. Tortuous aorta. Slight rotation to the RIGHT. Pulmonary vascularity normal. Peribronchial thickening without pulmonary infiltrate or pneumothorax. Small pleural effusions blunt the posterior costophrenic angles bilaterally. Bones appear diffusely demineralized. RIGHT AC joint degenerative changes. IMPRESSION: Bronchitic changes with small bibasilar pleural effusions. No definite acute infiltrate. Electronically Signed   By: Lavonia Dana M.D.   On: 12/29/2014 16:15   Dg Chest 2 View  12/26/2014  CLINICAL DATA:  Productive cough, syncope. EXAM: CHEST  2 VIEW COMPARISON:  September 19, 2014. FINDINGS: Stable cardiomediastinal silhouette. No pneumothorax is noted. Minimal bilateral posterior pleural effusions are noted. Mild central pulmonary vascular congestion is noted with possible bilateral perihilar edema. Narrowing of the subacromial space is noted bilaterally consistent with rotator cuff injury. IMPRESSION: Mild central pulmonary vascular congestion is noted with possible bilateral perihilar edema. Minimal bilateral pleural effusions are noted. Electronically Signed   By: Marijo Conception, M.D.   On: 12/26/2014 21:07   Ct Head Wo Contrast  12/26/2014  CLINICAL DATA:   Syncope, hypoglycemia EXAM: CT HEAD WITHOUT CONTRAST TECHNIQUE: Contiguous axial images were obtained from the base of the skull through the vertex without intravenous contrast. COMPARISON:  11/05/2014 FINDINGS: Motion degraded images. No evidence of parenchymal hemorrhage or extra-axial fluid collection. No mass lesion, mass effect, or midline shift. No CT evidence of acute infarction. Subcortical white matter and periventricular small vessel ischemic changes. Intracranial atherosclerosis. Global cortical and central atrophy. Secondary ventricular prominence. The visualized paranasal sinuses are essentially clear. The mastoid air cells are unopacified. No evidence of calvarial fracture. IMPRESSION: Motion degraded images. No evidence of acute intracranial abnormality. Atrophy with small vessel ischemic changes. Electronically Signed   By: Julian Hy M.D.   On: 12/26/2014 20:52    Microbiology: Recent Results (from the past 240 hour(s))  Urine culture     Status: None   Collection Time: 12/26/14  8:27 PM  Result Value Ref Range Status   Specimen Description URINE, RANDOM  Final   Special Requests NONE  Final   Culture   Final    MULTIPLE SPECIES PRESENT, SUGGEST RECOLLECTION Performed at [facility]    Report Status 12/28/2014 FINAL  Final  MRSA PCR Screening     Status: None   Collection Time: 12/27/14  5:55 AM  Result Value Ref Range Status   MRSA by PCR NEGATIVE NEGATIVE Final    Comment:        The GeneXpert MRSA Assay (FDA approved for NASAL specimens only), is one component of a comprehensive MRSA colonization surveillance program. It is not intended to diagnose MRSA infection nor to guide or monitor treatment for MRSA infections.      Labs: Basic Metabolic Panel:  Recent Labs Lab 12/27/14 0136 12/27/14 1215 12/28/14 0525 12/29/14 0600 12/30/14 0540  NA 141 139 139 140 139  K 3.8 3.9 3.7 3.2* 3.6  CL 108 106 106 105 105  CO2 29 26 27 29 28   GLUCOSE  184* 166* 218* 190* 117*  BUN 21* 20 22* 21* 17  CREATININE 1.53* 1.65* 1.60* 1.34* 1.26*  CALCIUM 8.6* 8.7* 8.3* 8.2*  8.3*  MG 1.7  --   --  1.6* 1.7  PHOS 3.5  --   --   --   --    Liver Function Tests:  Recent Labs Lab 12/27/14 0136 12/27/14 1215 12/28/14 0525 12/29/14 0600 12/30/14 0540  AST 31 35 27 31 32  ALT 12* 14 12* 13* 13*  ALKPHOS 149* 159* 149* 144* 146*  BILITOT 1.6* 1.4* 1.0 1.1 1.4*  PROT 6.6 7.1 6.6 6.6 6.8  ALBUMIN 2.2* 2.3* 2.1* 2.2* 2.2*   No results for input(s): LIPASE, AMYLASE in the last 168 hours.  Recent Labs Lab 12/27/14 0136 12/27/14 1215 12/28/14 0525 12/29/14 0600 12/30/14 0540  AMMONIA 72* 76* 67* 45* 41*   CBC:  Recent Labs Lab 12/25/14 1602 12/26/14 1932 12/27/14 0136 12/28/14 0525 12/29/14 0600 12/30/14 0540  WBC 5.9 6.1 6.6 5.8 6.6 7.1  NEUTROABS 3.0 3.1  --   --   --   --   HGB 11.1* 11.2* 9.9* 10.0* 10.1* 10.2*  HCT 33.8* 33.5* 29.9* 30.0* 30.8* 30.1*  MCV 99.7 101.5* 100.0 100.7* 99.4 100.3*  PLT 213 208 198 210 206 208   Cardiac Enzymes:  Recent Labs Lab 12/26/14 1932 12/27/14 0136 12/27/14 0754  TROPONINI <0.03 <0.03 <0.03   BNP: BNP (last 3 results) No results for input(s): BNP in the last 8760 hours.  ProBNP (last 3 results) No results for input(s): PROBNP in the last 8760 hours.  CBG:  Recent Labs Lab 12/29/14 0737 12/29/14 1154 12/29/14 1642 12/29/14 2042 12/30/14 0736  GLUCAP 151* 131* 177* 195* 101*       Signed:  Azaria Bartell MD, PhD  [facility] 12/30/2014, 10:17 AM

## 2014-12-30 NOTE — Care Management Note (Signed)
Case Management Note  Patient Details  Name: Tamara Stephenson MRN: [id number] Date of Birth: [birth year]  Subjective/Objective:  Tamara Stephenson rep Tamara Stephenson aware of d/c & HHC orders.                  Action/Plan:d/c home w/HHC.   Expected Discharge Date:   (unknown)               Expected Discharge Plan:  [facility]  In-House Referral:     Discharge planning Services  CM Consult  Post Acute Care Choice:    Choice offered to:  Patient  DME Arranged:    DME Agency:  [facility]  HH Arranged:  RN, PT, OT [facility] Agency:  [place]  Status of Service:  Completed, signed off  Medicare Important Message Given:    Date Medicare IM Given:    Medicare IM give by:    Date Additional Medicare IM Given:    Additional Medicare Important Message give by:     If discussed at [place] of Stay Meetings, dates discussed:    Additional Comments:  Dessa Phi, RN 12/30/2014, 11:36 AM

## 2014-12-31 LAB — FOLATE RBC
FOLATE, HEMOLYSATE: 396.6 ng/mL
Folate, RBC: 1305 ng/mL (ref >498)
HEMATOCRIT: 30.4 % — AB (ref 34.0–46.6)

## 2015-01-06 ENCOUNTER — Emergency Department (HOSPITAL_COMMUNITY): Payer: Medicare HMO

## 2015-01-06 ENCOUNTER — Inpatient Hospital Stay (HOSPITAL_COMMUNITY)
Admission: EM | Admit: 2015-01-06 | Discharge: 2015-01-10 | DRG: 470 | Disposition: A | Discharge status: Skilled Nursing Facility | Payer: Medicare HMO | Attending: Internal Medicine | Admitting: Internal Medicine

## 2015-01-06 ENCOUNTER — Inpatient Hospital Stay (HOSPITAL_COMMUNITY): Payer: Medicare HMO

## 2015-01-06 ENCOUNTER — Encounter (HOSPITAL_COMMUNITY): Payer: Self-pay

## 2015-01-06 DIAGNOSIS — Z66 Do not resuscitate: Secondary | ICD-10-CM | POA: Diagnosis present

## 2015-01-06 DIAGNOSIS — Z01810 Encounter for preprocedural cardiovascular examination: Secondary | ICD-10-CM | POA: Diagnosis not present

## 2015-01-06 DIAGNOSIS — Z794 Long term (current) use of insulin: Secondary | ICD-10-CM | POA: Diagnosis not present

## 2015-01-06 DIAGNOSIS — I129 Hypertensive chronic kidney disease with stage 1 through stage 4 chronic kidney disease, or unspecified chronic kidney disease: Secondary | ICD-10-CM | POA: Diagnosis present

## 2015-01-06 DIAGNOSIS — Z01811 Encounter for preprocedural respiratory examination: Secondary | ICD-10-CM

## 2015-01-06 DIAGNOSIS — Y92003 Bedroom of unspecified non-institutional (private) residence as the place of occurrence of the external cause: Secondary | ICD-10-CM

## 2015-01-06 DIAGNOSIS — D638 Anemia in other chronic diseases classified elsewhere: Secondary | ICD-10-CM | POA: Diagnosis not present

## 2015-01-06 DIAGNOSIS — F22 Delusional disorders: Secondary | ICD-10-CM | POA: Diagnosis not present

## 2015-01-06 DIAGNOSIS — M25551 Pain in right hip: Secondary | ICD-10-CM

## 2015-01-06 DIAGNOSIS — I2510 Atherosclerotic heart disease of native coronary artery without angina pectoris: Secondary | ICD-10-CM | POA: Diagnosis present

## 2015-01-06 DIAGNOSIS — N183 Chronic kidney disease, stage 3 unspecified: Secondary | ICD-10-CM | POA: Diagnosis present

## 2015-01-06 DIAGNOSIS — Z09 Encounter for follow-up examination after completed treatment for conditions other than malignant neoplasm: Secondary | ICD-10-CM

## 2015-01-06 DIAGNOSIS — S72009A Fracture of unspecified part of neck of unspecified femur, initial encounter for closed fracture: Secondary | ICD-10-CM | POA: Diagnosis present

## 2015-01-06 DIAGNOSIS — E1121 Type 2 diabetes mellitus with diabetic nephropathy: Secondary | ICD-10-CM | POA: Diagnosis present

## 2015-01-06 DIAGNOSIS — W06XXXA Fall from bed, initial encounter: Secondary | ICD-10-CM | POA: Diagnosis present

## 2015-01-06 DIAGNOSIS — I429 Cardiomyopathy, unspecified: Secondary | ICD-10-CM | POA: Diagnosis present

## 2015-01-06 DIAGNOSIS — S72001A Fracture of unspecified part of neck of right femur, initial encounter for closed fracture: Secondary | ICD-10-CM | POA: Diagnosis not present

## 2015-01-06 DIAGNOSIS — K7460 Unspecified cirrhosis of liver: Secondary | ICD-10-CM | POA: Diagnosis present

## 2015-01-06 DIAGNOSIS — Z7982 Long term (current) use of aspirin: Secondary | ICD-10-CM | POA: Diagnosis not present

## 2015-01-06 DIAGNOSIS — F17200 Nicotine dependence, unspecified, uncomplicated: Secondary | ICD-10-CM | POA: Diagnosis present

## 2015-01-06 DIAGNOSIS — R188 Other ascites: Secondary | ICD-10-CM | POA: Diagnosis present

## 2015-01-06 DIAGNOSIS — Z79899 Other long term (current) drug therapy: Secondary | ICD-10-CM

## 2015-01-06 DIAGNOSIS — E669 Obesity, unspecified: Secondary | ICD-10-CM | POA: Diagnosis present

## 2015-01-06 DIAGNOSIS — Z806 Family history of leukemia: Secondary | ICD-10-CM

## 2015-01-06 DIAGNOSIS — IMO0002 Reserved for concepts with insufficient information to code with codable children: Secondary | ICD-10-CM | POA: Diagnosis present

## 2015-01-06 DIAGNOSIS — E1165 Type 2 diabetes mellitus with hyperglycemia: Secondary | ICD-10-CM

## 2015-01-06 DIAGNOSIS — I5022 Chronic systolic (congestive) heart failure: Secondary | ICD-10-CM | POA: Diagnosis present

## 2015-01-06 DIAGNOSIS — E0821 Diabetes mellitus due to underlying condition with diabetic nephropathy: Secondary | ICD-10-CM | POA: Diagnosis not present

## 2015-01-06 DIAGNOSIS — S72011A Unspecified intracapsular fracture of right femur, initial encounter for closed fracture: Principal | ICD-10-CM | POA: Diagnosis present

## 2015-01-06 DIAGNOSIS — Z6830 Body mass index (BMI) 30.0-30.9, adult: Secondary | ICD-10-CM

## 2015-01-06 DIAGNOSIS — E1122 Type 2 diabetes mellitus with diabetic chronic kidney disease: Secondary | ICD-10-CM | POA: Diagnosis present

## 2015-01-06 DIAGNOSIS — R339 Retention of urine, unspecified: Secondary | ICD-10-CM | POA: Diagnosis not present

## 2015-01-06 DIAGNOSIS — E66811 Obesity, class 1: Secondary | ICD-10-CM | POA: Diagnosis present

## 2015-01-06 DIAGNOSIS — D631 Anemia in chronic kidney disease: Secondary | ICD-10-CM | POA: Diagnosis present

## 2015-01-06 DIAGNOSIS — R55 Syncope and collapse: Secondary | ICD-10-CM | POA: Diagnosis present

## 2015-01-06 DIAGNOSIS — E1129 Type 2 diabetes mellitus with other diabetic kidney complication: Secondary | ICD-10-CM | POA: Diagnosis present

## 2015-01-06 DIAGNOSIS — S72001D Fracture of unspecified part of neck of right femur, subsequent encounter for closed fracture with routine healing: Secondary | ICD-10-CM | POA: Diagnosis not present

## 2015-01-06 LAB — HEPATIC FUNCTION PANEL
ALBUMIN: 2.7 g/dL — AB (ref 3.5–5.0)
ALT: 20 U/L (ref 14–54)
AST: 41 U/L (ref 15–41)
Alkaline Phosphatase: 187 U/L — ABNORMAL HIGH (ref 38–126)
BILIRUBIN INDIRECT: 0.9 mg/dL (ref 0.3–0.9)
Bilirubin, Direct: 0.7 mg/dL — ABNORMAL HIGH (ref 0.1–0.5)
TOTAL PROTEIN: 8.1 g/dL (ref 6.5–8.1)
Total Bilirubin: 1.6 mg/dL — ABNORMAL HIGH (ref 0.3–1.2)

## 2015-01-06 LAB — CBC WITH DIFFERENTIAL/PLATELET
BASOS PCT: 0 %
Basophils Absolute: 0 10*3/uL (ref 0.0–0.1)
EOS ABS: 0.3 10*3/uL (ref 0.0–0.7)
EOS PCT: 4 %
HCT: 32.4 % — ABNORMAL LOW (ref 36.0–46.0)
HEMOGLOBIN: 10.7 g/dL — AB (ref 12.0–15.0)
LYMPHS ABS: 1.1 10*3/uL (ref 0.7–4.0)
Lymphocytes Relative: 16 %
MCH: 33.5 pg (ref 26.0–34.0)
MCHC: 33 g/dL (ref 30.0–36.0)
MCV: 101.6 fL — ABNORMAL HIGH (ref 78.0–100.0)
MONOS PCT: 10 %
Monocytes Absolute: 0.7 10*3/uL (ref 0.1–1.0)
NEUTROS PCT: 70 %
Neutro Abs: 4.9 10*3/uL (ref 1.7–7.7)
PLATELETS: 213 10*3/uL (ref 150–400)
RBC: 3.19 MIL/uL — ABNORMAL LOW (ref 3.87–5.11)
RDW: 17.4 % — AB (ref 11.5–15.5)
WBC: 7 10*3/uL (ref 4.0–10.5)

## 2015-01-06 LAB — BASIC METABOLIC PANEL
Anion gap: 5 (ref 5–15)
BUN: 17 mg/dL (ref 6–20)
CALCIUM: 8.5 mg/dL — AB (ref 8.9–10.3)
CHLORIDE: 106 mmol/L (ref 101–111)
CO2: 26 mmol/L (ref 22–32)
CREATININE: 1.28 mg/dL — AB (ref 0.44–1.00)
GFR, EST AFRICAN AMERICAN: 45 mL/min — AB (ref >60)
GFR, EST NON AFRICAN AMERICAN: 39 mL/min — AB (ref >60)
Glucose, Bld: 159 mg/dL — ABNORMAL HIGH (ref 65–99)
Potassium: 3.9 mmol/L (ref 3.5–5.1)
SODIUM: 137 mmol/L (ref 135–145)

## 2015-01-06 LAB — URINALYSIS, ROUTINE W REFLEX MICROSCOPIC
Bilirubin Urine: NEGATIVE
GLUCOSE, UA: 500 mg/dL — AB
Hgb urine dipstick: NEGATIVE
Ketones, ur: NEGATIVE mg/dL
LEUKOCYTES UA: NEGATIVE
NITRITE: NEGATIVE
PH: 7 (ref 5.0–8.0)
Protein, ur: NEGATIVE mg/dL
SPECIFIC GRAVITY, URINE: 1.014 (ref 1.005–1.030)
Urobilinogen, UA: 1 mg/dL (ref 0.0–1.0)

## 2015-01-06 LAB — PROTIME-INR
INR: 1.15 (ref 0.00–1.49)
INR: 1.09 (ref 0.00–1.49)
PROTHROMBIN TIME: 14.9 s (ref 11.6–15.2)
Prothrombin Time: 14.3 seconds (ref 11.6–15.2)

## 2015-01-06 LAB — GLUCOSE, CAPILLARY
Glucose-Capillary: 141 mg/dL — ABNORMAL HIGH (ref 65–99)
Glucose-Capillary: 119 mg/dL — ABNORMAL HIGH (ref 65–99)

## 2015-01-06 LAB — TYPE AND SCREEN
ABO/RH(D): A POS
Antibody Screen: NEGATIVE

## 2015-01-06 LAB — TROPONIN I: Troponin I: <0.03 ng/mL (ref <0.031)

## 2015-01-06 LAB — ABO/RH: ABO/RH(D): A POS

## 2015-01-06 LAB — APTT: aPTT: 29 seconds (ref 24–37)

## 2015-01-06 LAB — MAGNESIUM: Magnesium: 1.8 mg/dL (ref 1.7–2.4)

## 2015-01-06 MED ORDER — GABAPENTIN 300 MG PO CAPS
300.0000 mg | ORAL_CAPSULE | Freq: Every day | ORAL | Status: DC
  Administered 2015-01-06 – 2015-01-09 (×4): 300 mg via ORAL
  Filled 2015-01-06 (×5): qty 1

## 2015-01-06 MED ORDER — SODIUM CHLORIDE 0.9 % IV SOLN
1000.0000 mL | INTRAVENOUS | Status: DC
  Administered 2015-01-06 – 2015-01-07 (×4): 1000 mL via INTRAVENOUS

## 2015-01-06 MED ORDER — ASPIRIN EC 81 MG PO TBEC
162.0000 mg | DELAYED_RELEASE_TABLET | Freq: Every day | ORAL | Status: DC
  Administered 2015-01-06 – 2015-01-07 (×2): 162 mg via ORAL
  Filled 2015-01-06 (×3): qty 2

## 2015-01-06 MED ORDER — ASPIRIN 81 MG PO TBEC: 162.0000 mg | DELAYED_RELEASE_TABLET | Freq: Every day | ORAL | Status: DC

## 2015-01-06 MED ORDER — CEFAZOLIN SODIUM-DEXTROSE 2-3 GM-% IV SOLR
2.0000 g | INTRAVENOUS | Status: DC
Start: 2015-01-07 — End: 2015-01-08
  Filled 2015-01-06: qty 50

## 2015-01-06 MED ORDER — FUROSEMIDE 40 MG PO TABS
40.0000 mg | ORAL_TABLET | Freq: Every day | ORAL | Status: DC
  Administered 2015-01-06 – 2015-01-10 (×5): 40 mg via ORAL
  Filled 2015-01-06 (×5): qty 1

## 2015-01-06 MED ORDER — HYDRALAZINE HCL 20 MG/ML IJ SOLN: 10.0000 mg | Freq: Three times a day (TID) | INTRAMUSCULAR | Status: DC | PRN

## 2015-01-06 MED ORDER — LACTULOSE 10 GM/15ML PO SOLN
10.0000 g | Freq: Two times a day (BID) | ORAL | Status: DC
  Administered 2015-01-07 – 2015-01-10 (×6): 10 g via ORAL
  Filled 2015-01-06 (×9): qty 15

## 2015-01-06 MED ORDER — BUPROPION HCL ER (XL) 300 MG PO TB24
300.0000 mg | ORAL_TABLET | Freq: Every day | ORAL | Status: DC
  Administered 2015-01-06 – 2015-01-10 (×5): 300 mg via ORAL
  Filled 2015-01-06 (×5): qty 1

## 2015-01-06 MED ORDER — CARVEDILOL 6.25 MG PO TABS
6.2500 mg | ORAL_TABLET | Freq: Two times a day (BID) | ORAL | Status: DC
  Administered 2015-01-06 – 2015-01-10 (×8): 6.25 mg via ORAL
  Filled 2015-01-06 (×8): qty 1

## 2015-01-06 MED ORDER — CEPHALEXIN 500 MG PO CAPS
500.0000 mg | ORAL_CAPSULE | Freq: Two times a day (BID) | ORAL | Status: DC
  Administered 2015-01-06 – 2015-01-08 (×4): 500 mg via ORAL
  Filled 2015-01-06 (×5): qty 1

## 2015-01-06 MED ORDER — POTASSIUM CHLORIDE CRYS ER 20 MEQ PO TBCR
40.0000 meq | EXTENDED_RELEASE_TABLET | Freq: Every day | ORAL | Status: DC
  Administered 2015-01-06 – 2015-01-10 (×5): 40 meq via ORAL
  Filled 2015-01-06 (×5): qty 2

## 2015-01-06 MED ORDER — MORPHINE SULFATE (PF) 2 MG/ML IV SOLN
0.5000 mg | INTRAVENOUS | Status: DC | PRN
  Administered 2015-01-06 – 2015-01-08 (×7): 0.5 mg via INTRAVENOUS
  Filled 2015-01-06 (×6): qty 1

## 2015-01-06 MED ORDER — MORPHINE SULFATE (PF) 4 MG/ML IV SOLN
4.0000 mg | Freq: Once | INTRAVENOUS | Status: AC
  Administered 2015-01-06: 4 mg via INTRAVENOUS
  Filled 2015-01-06: qty 1

## 2015-01-06 MED ORDER — HYDROCODONE-ACETAMINOPHEN 5-325 MG PO TABS: 1.0000 | ORAL_TABLET | Freq: Four times a day (QID) | ORAL | Status: DC | PRN

## 2015-01-06 MED ORDER — URSODIOL 300 MG PO CAPS
300.0000 mg | ORAL_CAPSULE | Freq: Three times a day (TID) | ORAL | Status: DC
  Administered 2015-01-06 – 2015-01-10 (×11): 300 mg via ORAL
  Filled 2015-01-06 (×14): qty 1

## 2015-01-06 MED ORDER — DOCUSATE SODIUM 100 MG PO CAPS
100.0000 mg | ORAL_CAPSULE | Freq: Two times a day (BID) | ORAL | Status: DC
  Administered 2015-01-06 – 2015-01-08 (×4): 100 mg via ORAL
  Filled 2015-01-06 (×6): qty 1

## 2015-01-06 MED ORDER — INSULIN ASPART 100 UNIT/ML ~~LOC~~ SOLN
0.0000 [IU] | Freq: Three times a day (TID) | SUBCUTANEOUS | Status: DC
  Administered 2015-01-06: 1 [IU] via SUBCUTANEOUS
  Administered 2015-01-07: 3 [IU] via SUBCUTANEOUS
  Administered 2015-01-08: 2 [IU] via SUBCUTANEOUS
  Administered 2015-01-08: 1 [IU] via SUBCUTANEOUS
  Administered 2015-01-09: 2 [IU] via SUBCUTANEOUS
  Administered 2015-01-09: 1 [IU] via SUBCUTANEOUS
  Administered 2015-01-09: 2 [IU] via SUBCUTANEOUS
  Administered 2015-01-10: 1 [IU] via SUBCUTANEOUS
  Administered 2015-01-10: 3 [IU] via SUBCUTANEOUS

## 2015-01-06 NOTE — ED Notes (Signed)
Unable to draw blood from existing IV.

## 2015-01-06 NOTE — ED Notes (Signed)
Bed: NT61 Expected date: 01/06/15 Expected time:  Means of arrival: Ambulance Comments: Hip pain

## 2015-01-06 NOTE — Consult Note (Signed)
Tamara Stephenson is an 81 y.o. female.    Chief Complaint: right hip pain  HPI: Pt is a 81 y.o. female complaining of right hip pain s/p fall last night. Unable to ambulate after this fall. Pt normally only walks to the bathroom and has to use a walker Pain had continually increased since the fall. Denies any other injuries s/p this fall.  Risks, benefits and expectations were discussed with the patient. Patient understand the risks, benefits and expectations and wishes to proceed with surgery.   PCP:  Curly Rim, MD  D/C Plans: Home  PMH: Past Medical History  Diagnosis Date  . Back pain   . Hypertension   . Carotid stenosis   . Colitis   . Diabetes mellitus without complication (HCC)     PSH: Past Surgical History  Procedure Laterality Date  . Carotid endarterectomy    . Lumbar laminectomy for epidural abscess N/A 10/16/2013    Procedure: LUMBAR LAMINECTOMY FOR EPIDURAL ABSCESS Lumbar Two through Four;  Surgeon: Charlie Pitter, MD;  Location: [facility];  Service: Neurosurgery;  Laterality: N/A;    Social History:  reports that she has been smoking.  She does not have any smokeless tobacco history on file. She reports that she does not drink alcohol or use illicit drugs.  Allergies:  Allergies  Allergen Reactions  . Peanut-Containing Drug Products Nausea And Vomiting    Medications: Current Facility-Administered Medications  Medication Dose Route Frequency Provider Last Rate Last Dose  . 0.9 %  sodium chloride infusion  1,000 mL Intravenous Continuous Dorie Rank, MD 125 mL/hr at 01/06/15 1120 1,000 mL at 01/06/15 1120  . insulin aspart (novoLOG) injection 0-9 Units  0-9 Units Subcutaneous TID WC Belkys A Regalado, MD       Current Outpatient Prescriptions  Medication Sig Dispense Refill  . acetaminophen (TYLENOL) 500 MG tablet Take 500 mg by mouth every 6 (six) hours as needed for moderate pain.     Marland Kitchen aspirin (ASPIRIN EC) 81 MG EC tablet Take 162 mg by mouth at bedtime.  Swallow whole.    Marland Kitchen buPROPion (WELLBUTRIN XL) 300 MG 24 hr tablet Take 300 mg by mouth daily.    . cholecalciferol (VITAMIN D) 1000 UNITS tablet Take 2,000 Units by mouth daily.     . Cyanocobalamin (VITAMIN B 12) 100 MCG LOZG Take 1 tablet by mouth every other day.     . gabapentin (NEURONTIN) 300 MG capsule Take 1 capsule (300 mg total) by mouth at bedtime.    . insulin aspart (NOVOLOG) 100 UNIT/ML injection Inject 3 Units into the skin 3 (three) times daily with meals. 10 mL 11  . insulin detemir (LEVEMIR) 100 UNIT/ML injection Inject 0.08 mLs (8 Units total) into the skin at bedtime. 10 mL 11  . lovastatin (MEVACOR) 20 MG tablet Take 20 mg by mouth at bedtime.    . ranitidine (ZANTAC) 300 MG tablet Take 300 mg by mouth 3 times/day as needed-between meals & bedtime for heartburn.     . ursodiol (ACTIGALL) 300 MG capsule Take 300 mg by mouth 3 (three) times daily.    Marland Kitchen acidophilus (RISAQUAD) CAPS capsule Take 1 capsule by mouth daily. (Patient not taking: Reported on 01/06/2015) 30 capsule 0  . carvedilol (COREG) 6.25 MG tablet Take 1 tablet (6.25 mg total) by mouth 2 (two) times daily with a meal. (Patient not taking: Reported on 01/06/2015) 60 tablet 0  . cephALEXin (KEFLEX) 500 MG capsule Take 1 capsule (500 mg  total) by mouth 2 (two) times daily. (Patient not taking: Reported on 01/06/2015) 10 capsule 0  . furosemide (LASIX) 40 MG tablet Take 1 tablet (40 mg total) by mouth daily. (Patient not taking: Reported on 01/06/2015) 30 tablet 0  . lactulose (CHRONULAC) 10 GM/15ML solution Take 15 mLs (10 g total) by mouth 2 (two) times daily. (Patient not taking: Reported on 01/06/2015) 240 mL 0  . potassium chloride SA (K-DUR,KLOR-CON) 20 MEQ tablet Take 2 tablets (40 mEq total) by mouth daily. (Patient not taking: Reported on 01/06/2015) 30 tablet 0    Results for orders placed or performed during the hospital encounter of 01/06/15 (from the past 48 hour(s))  Basic metabolic panel     Status: Abnormal     Collection Time: 01/06/15 12:12 PM  Result Value Ref Range   Sodium 137 135 - 145 mmol/L   Potassium 3.9 3.5 - 5.1 mmol/L   Chloride 106 101 - 111 mmol/L   CO2 26 22 - 32 mmol/L   Glucose, Bld 159 (H) 65 - 99 mg/dL   BUN 17 6 - 20 mg/dL   Creatinine, Ser 1.28 (H) 0.44 - 1.00 mg/dL   Calcium 8.5 (L) 8.9 - 10.3 mg/dL   GFR calc non Af Amer 39 (L) >60 mL/min   GFR calc Af Amer 45 (L) >60 mL/min    Comment: (NOTE) The eGFR has been calculated using the CKD EPI equation. This calculation has not been validated in all clinical situations. eGFR's persistently <60 mL/min signify possible Chronic Kidney Disease.    Anion gap 5 5 - 15  CBC WITH DIFFERENTIAL     Status: Abnormal   Collection Time: 01/06/15 12:12 PM  Result Value Ref Range   WBC 7.0 4.0 - 10.5 K/uL   RBC 3.19 (L) 3.87 - 5.11 MIL/uL   Hemoglobin 10.7 (L) 12.0 - 15.0 g/dL   HCT 32.4 (L) 36.0 - 46.0 %   MCV 101.6 (H) 78.0 - 100.0 fL   MCH 33.5 26.0 - 34.0 pg   MCHC 33.0 30.0 - 36.0 g/dL   RDW 17.4 (H) 11.5 - 15.5 %   Platelets 213 150 - 400 K/uL   Neutrophils Relative % 70 %   Neutro Abs 4.9 1.7 - 7.7 K/uL   Lymphocytes Relative 16 %   Lymphs Abs 1.1 0.7 - 4.0 K/uL   Monocytes Relative 10 %   Monocytes Absolute 0.7 0.1 - 1.0 K/uL   Eosinophils Relative 4 %   Eosinophils Absolute 0.3 0.0 - 0.7 K/uL   Basophils Relative 0 %   Basophils Absolute 0.0 0.0 - 0.1 K/uL  Protime-INR     Status: None   Collection Time: 01/06/15 12:12 PM  Result Value Ref Range   Prothrombin Time 14.9 11.6 - 15.2 seconds   INR 1.15 0.00 - 1.49  Type and screen [place]     Status: None   Collection Time: 01/06/15 12:12 PM  Result Value Ref Range   ABO/RH(D) A POS    Antibody Screen NEG    Sample Expiration 01/09/2015   ABO/Rh     Status: None   Collection Time: 01/06/15 12:12 PM  Result Value Ref Range   ABO/RH(D) A POS   Urinalysis, Routine w reflex microscopic (not at [facility])     Status: Abnormal   Collection  Time: 01/06/15 12:25 PM  Result Value Ref Range   Color, Urine YELLOW YELLOW   APPearance CLOUDY (A) CLEAR   Specific Gravity, Urine 1.014  1.005 - 1.030   pH 7.0 5.0 - 8.0   Glucose, UA 500 (A) NEGATIVE mg/dL   Hgb urine dipstick NEGATIVE NEGATIVE   Bilirubin Urine NEGATIVE NEGATIVE   Ketones, ur NEGATIVE NEGATIVE mg/dL   Protein, ur NEGATIVE NEGATIVE mg/dL   Urobilinogen, UA 1.0 0.0 - 1.0 mg/dL   Nitrite NEGATIVE NEGATIVE   Leukocytes, UA NEGATIVE NEGATIVE    Comment: MICROSCOPIC NOT DONE ON URINES WITH NEGATIVE PROTEIN, BLOOD, LEUKOCYTES, NITRITE, OR GLUCOSE <1000 mg/dL.   Dg Hip Unilat With Pelvis 2-3 Views Right  01/06/2015  CLINICAL DATA:  Right hip pain.  Fall from wheelchair. EXAM: DG HIP (WITH OR WITHOUT PELVIS) 2-3V RIGHT COMPARISON:  None. FINDINGS: There is a right femoral neck fracture with varus angulation. No subluxation or dislocation. Mild degenerative changes in the hips bilaterally. IMPRESSION: Right femoral neck fracture with varus angulation. Electronically Signed   By: Rolm Baptise M.D.   On: 01/06/2015 11:45    ROS: Pain with rom of the right lower extremity  Physical Exam:  Alert and oriented 81 y.o. female in no acute distress Cranial nerves 2-12 intact Cervical spine: full rom with no tenderness, nv intact distally Chest: active breath sounds bilaterally, no wheeze rhonchi or rales Heart: regular rate and rhythm, no murmur Abd: non tender non distended with active bowel sounds Pelvis tender on the right  Right lower extremity with external rotation and shortening Mild to moderate edema and venous insufficiency nv intact but has decreased sensation to bilateral legs Exquisite pain with rom of right lower extremity  Assessment/Plan Assessment: right femoral neck fracture  Plan: Patient will undergo a right hip hemiarthroplasty vs total by Dr. Lyla Glassing if the POA gives approval due to elevated risks of surgery. Risks benefits and expectations were  discussed with the patient and POA. Patient understand risks, benefits and expectations and wishes to proceed.

## 2015-01-06 NOTE — ED Notes (Signed)
Per EMS, pt from assisted living - [facility].  Pt was attempting to get in wheelchair last pm and chair slid out.  Pt went to buttocks.  Pt sat in floor throughout night.  Found this morning.  Pain to right hip with shortening and rotation.   IV 20 g RAC.  150 mg fentanyl given in route.  Pain 3/10 post meds.  Vitals: 130/90, hr 60, resp 20, cbg 107

## 2015-01-06 NOTE — H&P (Signed)
[facility] History and Physical  Melannie Metzner NTI:[id number] DOB: 28-Nov-1933 DOA: 01/06/2015  Referring physician:  PCP: Curly Rim, MD   Chief Complaint: fall, hip pain  HPI: Tamara Stephenson is a 81 y.o. female with PMH significant for HTN,  Liver diseases, cirrhosis by CT scan,  Diabetes, Systolic HF ef 35 -40 % by ECHO 12-2014, last admission to hospital for Syncope secondary to hypoglycemia, UTI who presents today after a fall. Patient thinks she was trying to move from bed to her wheelchair when she fell on the floor. Last thing she recall, she wake up almost 6 hours after. She call for help. She denies chest pain, or dyspnea. She able able to use a walker at her house, but with difficulty due to her knees problems.  She is waiting to get the medications that were prescribe last admission    Review of Systems:  Negative, except as per HPI.   Past Medical History  Diagnosis Date  . Back pain   . Hypertension   . Carotid stenosis   . Colitis   . Diabetes mellitus without complication [facility])    Past Surgical History  Procedure Laterality Date  . Carotid endarterectomy    . Lumbar laminectomy for epidural abscess N/A 10/16/2013    Procedure: LUMBAR LAMINECTOMY FOR EPIDURAL ABSCESS Lumbar Two through Four;  Surgeon: Charlie Pitter, MD;  Location: [facility];  Service: Neurosurgery;  Laterality: N/A;   Social History:  reports that she has been smoking.  She does not have any smokeless tobacco history on file. She reports that she does not drink alcohol or use illicit drugs.  Allergies  Allergen Reactions  . Peanut-Containing Drug Products Nausea And Vomiting    Family History  Problem Relation Age of Onset  . Leukemia Mother   . Jaundice Father     Prior to Admission medications   Medication Sig Start Date End Date Taking? Authorizing Provider  acetaminophen (TYLENOL) 500 MG tablet Take 500 mg by mouth every 6 (six) hours as needed for moderate pain.    Yes  Historical Provider, MD  aspirin (ASPIRIN EC) 81 MG EC tablet Take 162 mg by mouth at bedtime. Swallow whole.   Yes Historical Provider, MD  buPROPion (WELLBUTRIN XL) 300 MG 24 hr tablet Take 300 mg by mouth daily.   Yes Historical Provider, MD  cholecalciferol (VITAMIN D) 1000 UNITS tablet Take 2,000 Units by mouth daily.    Yes Historical Provider, MD  Cyanocobalamin (VITAMIN B 12) 100 MCG LOZG Take 1 tablet by mouth every other day.    Yes Historical Provider, MD  gabapentin (NEURONTIN) 300 MG capsule Take 1 capsule (300 mg total) by mouth at bedtime. 10/21/13  Yes Marianne L York, PA-C  insulin aspart (NOVOLOG) 100 UNIT/ML injection Inject 3 Units into the skin 3 (three) times daily with meals. 10/21/13  Yes Marianne L York, PA-C  insulin detemir (LEVEMIR) 100 UNIT/ML injection Inject 0.08 mLs (8 Units total) into the skin at bedtime. 12/30/14  Yes Florencia Reasons, MD  lovastatin (MEVACOR) 20 MG tablet Take 20 mg by mouth at bedtime.   Yes Historical Provider, MD  ranitidine (ZANTAC) 300 MG tablet Take 300 mg by mouth 3 times/day as needed-between meals & bedtime for heartburn.    Yes Historical Provider, MD  ursodiol (ACTIGALL) 300 MG capsule Take 300 mg by mouth 3 (three) times daily.   Yes Historical Provider, MD  acidophilus (RISAQUAD) CAPS capsule Take 1 capsule by mouth daily. Patient not  taking: Reported on 01/06/2015 12/30/14   Florencia Reasons, MD  carvedilol (COREG) 6.25 MG tablet Take 1 tablet (6.25 mg total) by mouth 2 (two) times daily with a meal. Patient not taking: Reported on 01/06/2015 12/30/14   Florencia Reasons, MD  cephALEXin (KEFLEX) 500 MG capsule Take 1 capsule (500 mg total) by mouth 2 (two) times daily. Patient not taking: Reported on 01/06/2015 12/30/14   Florencia Reasons, MD  furosemide (LASIX) 40 MG tablet Take 1 tablet (40 mg total) by mouth daily. Patient not taking: Reported on 01/06/2015 12/30/14   Florencia Reasons, MD  lactulose (CHRONULAC) 10 GM/15ML solution Take 15 mLs (10 g total) by mouth 2 (two) times  daily. Patient not taking: Reported on 01/06/2015 12/30/14   Florencia Reasons, MD  potassium chloride SA (K-DUR,KLOR-CON) 20 MEQ tablet Take 2 tablets (40 mEq total) by mouth daily. Patient not taking: Reported on 01/06/2015 12/30/14   Florencia Reasons, MD   Physical Exam: Filed Vitals:   01/06/15 1042  BP: 157/81  Pulse: 67  Temp: 97.5 F (36.4 C)  TempSrc: Oral  Resp: 20  SpO2: 95%    Wt Readings from Last 3 Encounters:  12/26/14 87 kg (191 lb 12.8 oz)  11/05/14 95.255 kg (210 lb)  09/20/14 101.923 kg (224 lb 11.2 oz)    General:  Appears calm and comfortable, mild icteric.  Eyes: PERRL, normal lids, irises & conjunctiva ENT: grossly normal hearing, lips & tongue Neck: no LAD, masses or thyromegaly Cardiovascular: RRR, no m/r/g. Trace  LE edema. Mild redness Telemetry: SR, no arrhythmias  Respiratory: CTA bilaterally, no w/r/r. Normal respiratory effort. Abdomen: soft, nt, mild distended.  Skin: no rash or induration seen on limited exam Musculoskeletal: grossly normal tone BUE/BLE Neurologic: grossly non-focal. Limited due to right hip  Pain           Labs on Admission:  Basic Metabolic Panel:  Recent Labs Lab 01/06/15 1212  NA 137  K 3.9  CL 106  CO2 26  GLUCOSE 159*  BUN 17  CREATININE 1.28*  CALCIUM 8.5*   Liver Function Tests: No results for input(s): AST, ALT, ALKPHOS, BILITOT, PROT, ALBUMIN in the last 168 hours. No results for input(s): LIPASE, AMYLASE in the last 168 hours. No results for input(s): AMMONIA in the last 168 hours. CBC:  Recent Labs Lab 01/06/15 1212  WBC 7.0  NEUTROABS 4.9  HGB 10.7*  HCT 32.4*  MCV 101.6*  PLT 213   Cardiac Enzymes: No results for input(s): CKTOTAL, CKMB, CKMBINDEX, TROPONINI in the last 168 hours.  BNP (last 3 results) No results for input(s): BNP in the last 8760 hours.  ProBNP (last 3 results) No results for input(s): PROBNP in the last 8760 hours.  CBG: No results for input(s): GLUCAP in the last 168  hours.  Radiological Exams on Admission: Dg Hip Unilat With Pelvis 2-3 Views Right  01/06/2015  CLINICAL DATA:  Right hip pain.  Fall from wheelchair. EXAM: DG HIP (WITH OR WITHOUT PELVIS) 2-3V RIGHT COMPARISON:  None. FINDINGS: There is a right femoral neck fracture with varus angulation. No subluxation or dislocation. Mild degenerative changes in the hips bilaterally. IMPRESSION: Right femoral neck fracture with varus angulation. Electronically Signed   By: Rolm Baptise M.D.   On: 01/06/2015 11:45    EKG: Independently reviewed.   Left ventricle: The cavity size was normal. Wall thickness was increased in a pattern of mild LVH. Systolic function was moderately reduced. The estimated ejection fraction was in the range  of 35% to 40%. Wall motion was normal; there were no regional wall motion abnormalities. - Aortic valve: Mildly calcified annulus. Moderately thickened, moderately calcified leaflets. There was mild stenosis. Valve area (Vmax): 1.61 cm^2. - Mitral valve: Mildly calcified annulus. - Left atrium: The atrium was mildly dilated. - Right atrium: The atrium was mildly dilated.   Assessment/Plan Active Problems:   CKD (chronic kidney disease) stage 3, GFR 30-59 ml/min   Anemia of chronic disease   Diabetes mellitus with renal manifestations, uncontrolled (HCC)   Obesity, Class I, BMI 30-34.9   Syncope   Cirrhosis of liver with ascites (HCC)   Hip fracture (HCC)  1-Right Femoral Neck Fracture:  Admit to telemetry/ unclear if syncope episode. Will ask cardiology clearance and evaluation. Patient with multiple risk factors and Systolic HF ef 35 %.  EKG sinus rhythm , prolong QT.  Will check pre op Chest x ray.  Ortho has been consulted.  Regarding diagnosis of liver diseases: on prior CT scan done 10-20  she was found to have Cirrhosis with moderate ascites. .I will check LFT, INR, PTT.   2-Questionable syncope: Cycle cardiac enzymes. Monitor on telemetry/  cardiology consulted. Prolong QT , will check Mg level.   3-Diabetes; hold levemir. SSI.   4-Cirrhosis; resume lactulose, lasix.   5-Systolic HF; continue with lasix. Appears compensated.   6-recent UTI; was not taking antibiotics. Will resume keflex.      Code Status: DNR DVT Prophylaxis: SCD.  Family Communication: care discussed with patient, she does not have family in the [place].  Disposition Plan: expect 2 to 4 days inpatient.   Time spent: 75 minutes.   Niel Hummer A [facility] Pager [phone]

## 2015-01-06 NOTE — ED Notes (Signed)
Patient transported to X-ray 

## 2015-01-06 NOTE — ED Provider Notes (Signed)
CSN: [id number]     Arrival date & time 01/06/15  1025 History   First MD Initiated Contact with Patient 01/06/15 1037     Chief Complaint  Patient presents with  . Fall  . Hip Pain   HPI Patient presents to the emergency room with complaints of right hip pain. Patient usually uses a wheelchair and a walker at home. She was attempting to get out of her wheelchair when she slipped and fell landing on her right hip. Patient was unable to get up and stand since that time. EMS was contacted and she was brought to the emergency room. She was given pain medications by EMS with some improvement. Patient denies any trouble with any neck pain or back pain. She denies any trouble with any numbness. She denies any trouble with chest pain shortness of breath Past Medical History  Diagnosis Date  . Back pain   . Hypertension   . Carotid stenosis   . Colitis   . Diabetes mellitus without complication [facility])    Past Surgical History  Procedure Laterality Date  . Carotid endarterectomy    . Lumbar laminectomy for epidural abscess N/A 10/16/2013    Procedure: LUMBAR LAMINECTOMY FOR EPIDURAL ABSCESS Lumbar Two through Four;  Surgeon: Charlie Pitter, MD;  Location: [facility];  Service: Neurosurgery;  Laterality: N/A;   Family History  Problem Relation Age of Onset  . Leukemia Mother   . Jaundice Father    Social History  Substance Use Topics  . Smoking status: Current Every Day Smoker -- 0.20 packs/day  . Smokeless tobacco: None  . Alcohol Use: No   OB History    No data available     Review of Systems  All other systems reviewed and are negative.     Allergies  Peanut-containing drug products  Home Medications   Prior to Admission medications   Medication Sig Start Date End Date Taking? Authorizing Provider  acetaminophen (TYLENOL) 500 MG tablet Take 500 mg by mouth every 6 (six) hours as needed for moderate pain.    Yes Historical Provider, MD  aspirin (ASPIRIN EC) 81 MG EC tablet  Take 162 mg by mouth at bedtime. Swallow whole.   Yes Historical Provider, MD  buPROPion (WELLBUTRIN XL) 300 MG 24 hr tablet Take 300 mg by mouth daily.   Yes Historical Provider, MD  cholecalciferol (VITAMIN D) 1000 UNITS tablet Take 2,000 Units by mouth daily.    Yes Historical Provider, MD  Cyanocobalamin (VITAMIN B 12) 100 MCG LOZG Take 1 tablet by mouth every other day.    Yes Historical Provider, MD  gabapentin (NEURONTIN) 300 MG capsule Take 1 capsule (300 mg total) by mouth at bedtime. 10/21/13  Yes Marianne L York, PA-C  insulin aspart (NOVOLOG) 100 UNIT/ML injection Inject 3 Units into the skin 3 (three) times daily with meals. 10/21/13  Yes Marianne L York, PA-C  insulin detemir (LEVEMIR) 100 UNIT/ML injection Inject 0.08 mLs (8 Units total) into the skin at bedtime. 12/30/14  Yes Florencia Reasons, MD  lovastatin (MEVACOR) 20 MG tablet Take 20 mg by mouth at bedtime.   Yes Historical Provider, MD  ranitidine (ZANTAC) 300 MG tablet Take 300 mg by mouth 3 times/day as needed-between meals & bedtime for heartburn.    Yes Historical Provider, MD  ursodiol (ACTIGALL) 300 MG capsule Take 300 mg by mouth 3 (three) times daily.   Yes Historical Provider, MD  acidophilus (RISAQUAD) CAPS capsule Take 1 capsule by mouth daily.  Patient not taking: Reported on 01/06/2015 12/30/14   Florencia Reasons, MD  carvedilol (COREG) 6.25 MG tablet Take 1 tablet (6.25 mg total) by mouth 2 (two) times daily with a meal. Patient not taking: Reported on 01/06/2015 12/30/14   Florencia Reasons, MD  cephALEXin (KEFLEX) 500 MG capsule Take 1 capsule (500 mg total) by mouth 2 (two) times daily. Patient not taking: Reported on 01/06/2015 12/30/14   Florencia Reasons, MD  furosemide (LASIX) 40 MG tablet Take 1 tablet (40 mg total) by mouth daily. Patient not taking: Reported on 01/06/2015 12/30/14   Florencia Reasons, MD  lactulose (CHRONULAC) 10 GM/15ML solution Take 15 mLs (10 g total) by mouth 2 (two) times daily. Patient not taking: Reported on 01/06/2015 12/30/14    Florencia Reasons, MD  potassium chloride SA (K-DUR,KLOR-CON) 20 MEQ tablet Take 2 tablets (40 mEq total) by mouth daily. Patient not taking: Reported on 01/06/2015 12/30/14   Florencia Reasons, MD   BP 168/88 mmHg  Pulse 68  Temp(Src) 98.5 F (36.9 C) (Oral)  Resp 18  Ht 5\' 6"  (1.676 m)  Wt 188 lb 15 oz (85.7 kg)  BMI 30.51 kg/m2  SpO2 93% Physical Exam  Constitutional: No distress.  HENT:  Head: Normocephalic and atraumatic.  Right Ear: External ear normal.  Left Ear: External ear normal.  Eyes: Conjunctivae are normal. Right eye exhibits no discharge. Left eye exhibits no discharge. No scleral icterus.  Neck: Neck supple. No tracheal deviation present.  Cardiovascular: Normal rate, regular rhythm and intact distal pulses.   Pulmonary/Chest: Effort normal and breath sounds normal. No stridor. No respiratory distress. She has no wheezes. She has no rales.  Abdominal: Soft. Bowel sounds are normal. She exhibits no distension. There is no tenderness. There is no rebound and no guarding.  Musculoskeletal: She exhibits edema (pitting edema bilateral lower extremity).       Right hip: She exhibits tenderness, bony tenderness and deformity.       Left hip: Normal.       Lumbar back: Normal. She exhibits no tenderness.  No erythema noted, shortening of rle  Neurological: She is alert. No cranial nerve deficit (no facial droop, extraocular movements intact, no slurred speech) or sensory deficit. She exhibits normal muscle tone. She displays no seizure activity. Coordination normal.  The patient is able to move all of her extremities, she is unable to lift her right leg off the bed  Skin: Skin is warm and dry. No rash noted. She is not diaphoretic.  Psychiatric: She has a normal mood and affect.  Nursing note and vitals reviewed.   ED Course  Procedures (including critical care time) Labs Review Labs Reviewed  BASIC METABOLIC PANEL - Abnormal; Notable for the following:    Glucose, Bld 159 (*)     Creatinine, Ser 1.28 (*)    Calcium 8.5 (*)    GFR calc non Af Amer 39 (*)    GFR calc Af Amer 45 (*)    All other components within normal limits  CBC WITH DIFFERENTIAL/PLATELET - Abnormal; Notable for the following:    RBC 3.19 (*)    Hemoglobin 10.7 (*)    HCT 32.4 (*)    MCV 101.6 (*)    RDW 17.4 (*)    All other components within normal limits  URINALYSIS, ROUTINE W REFLEX MICROSCOPIC (NOT AT [facility]) - Abnormal; Notable for the following:    APPearance CLOUDY (*)    Glucose, UA 500 (*)    All other components  within normal limits  HEPATIC FUNCTION PANEL - Abnormal; Notable for the following:    Albumin 2.7 (*)    Alkaline Phosphatase 187 (*)    Total Bilirubin 1.6 (*)    Bilirubin, Direct 0.7 (*)    All other components within normal limits  CBC - Abnormal; Notable for the following:    RBC 3.08 (*)    Hemoglobin 10.0 (*)    HCT 31.2 (*)    MCV 101.3 (*)    RDW 17.3 (*)    All other components within normal limits  BASIC METABOLIC PANEL - Abnormal; Notable for the following:    Creatinine, Ser 1.17 (*)    Calcium 7.9 (*)    GFR calc non Af Amer 43 (*)    GFR calc Af Amer 50 (*)    Anion gap 2 (*)    All other components within normal limits  GLUCOSE, CAPILLARY - Abnormal; Notable for the following:    Glucose-Capillary 141 (*)    All other components within normal limits  GLUCOSE, CAPILLARY - Abnormal; Notable for the following:    Glucose-Capillary 119 (*)    All other components within normal limits  GLUCOSE, CAPILLARY - Abnormal; Notable for the following:    Glucose-Capillary 50 (*)    All other components within normal limits  SURGICAL PCR SCREEN  PROTIME-INR  TROPONIN I  TROPONIN I  TROPONIN I  MAGNESIUM  APTT  PROTIME-INR  TYPE AND SCREEN  ABO/RH    Imaging Review Chest Portable 1 View  01/06/2015  CLINICAL DATA:  Preoperative chest x-ray. Patient is a current smoker. EXAM: PORTABLE CHEST 1 VIEW COMPARISON:  December 29, 2014 FINDINGS: The heart  size and mediastinal contours are stable. The heart size is enlarged. Mild increased pulmonary markings are identified bilaterally, probably chronic. There is no focal pneumonia pulmonary edema or pleural effusion. The visualized skeletal structures are stable P IMPRESSION: No active cardiopulmonary disease. Mild increased pulmonary markings identified bilaterally, probably chronic. Electronically Signed   By: Abelardo Diesel M.D.   On: 01/06/2015 17:10   Dg Hip Unilat With Pelvis 2-3 Views Right  01/06/2015  CLINICAL DATA:  Right hip pain.  Fall from wheelchair. EXAM: DG HIP (WITH OR WITHOUT PELVIS) 2-3V RIGHT COMPARISON:  None. FINDINGS: There is a right femoral neck fracture with varus angulation. No subluxation or dislocation. Mild degenerative changes in the hips bilaterally. IMPRESSION: Right femoral neck fracture with varus angulation. Electronically Signed   By: Rolm Baptise M.D.   On: 01/06/2015 11:45   I have personally reviewed and evaluated these images and lab results as part of my medical decision-making.   EKG Interpretation   Date/Time:  Tuesday January 06 2015 11:24:58 EDT Ventricular Rate:  67 PR Interval:  161 QRS Duration: 112 QT Interval:  488 QTC Calculation: 515 R Axis:   52 Text Interpretation:  Sinus rhythm Borderline intraventricular conduction  delay Nonspecific T abnormalities, lateral leads Prolonged QT interval No  significant change since last tracing Confirmed by Abner Ardis  MD-J, Stalin Gruenberg  ([id number]) on 01/06/2015 12:17:16 PM      MDM   Final diagnoses:  Closed fracture of neck of right femur, initial encounter ([place])    The patient's laboratory tests were stable. Her anemia is unchanged. Stable renal insufficiency. X-rays demonstrated a femoral neck fracture. I spoke with orthopedics on-call and the plan was to admit the patient to the hospitalist service for planned operative repair of her femoral neck fracture    Dorie Rank,  MD 01/07/15 [phone]

## 2015-01-07 ENCOUNTER — Inpatient Hospital Stay (HOSPITAL_COMMUNITY): Payer: Medicare HMO

## 2015-01-07 ENCOUNTER — Encounter (HOSPITAL_COMMUNITY): Payer: Medicare HMO

## 2015-01-07 DIAGNOSIS — D638 Anemia in other chronic diseases classified elsewhere: Secondary | ICD-10-CM

## 2015-01-07 DIAGNOSIS — Z01810 Encounter for preprocedural cardiovascular examination: Secondary | ICD-10-CM

## 2015-01-07 DIAGNOSIS — N183 Chronic kidney disease, stage 3 (moderate): Secondary | ICD-10-CM

## 2015-01-07 DIAGNOSIS — E0821 Diabetes mellitus due to underlying condition with diabetic nephropathy: Secondary | ICD-10-CM

## 2015-01-07 DIAGNOSIS — F22 Delusional disorders: Secondary | ICD-10-CM

## 2015-01-07 DIAGNOSIS — I429 Cardiomyopathy, unspecified: Secondary | ICD-10-CM

## 2015-01-07 DIAGNOSIS — E0865 Diabetes mellitus due to underlying condition with hyperglycemia: Secondary | ICD-10-CM

## 2015-01-07 DIAGNOSIS — S72001A Fracture of unspecified part of neck of right femur, initial encounter for closed fracture: Secondary | ICD-10-CM

## 2015-01-07 LAB — BASIC METABOLIC PANEL
Anion gap: 2 — ABNORMAL LOW (ref 5–15)
BUN: 16 mg/dL (ref 6–20)
CO2: 28 mmol/L (ref 22–32)
CREATININE: 1.17 mg/dL — AB (ref 0.44–1.00)
Calcium: 7.9 mg/dL — ABNORMAL LOW (ref 8.9–10.3)
Chloride: 107 mmol/L (ref 101–111)
GFR, EST AFRICAN AMERICAN: 50 mL/min — AB (ref >60)
GFR, EST NON AFRICAN AMERICAN: 43 mL/min — AB (ref >60)
Glucose, Bld: 86 mg/dL (ref 65–99)
POTASSIUM: 3.9 mmol/L (ref 3.5–5.1)
SODIUM: 137 mmol/L (ref 135–145)

## 2015-01-07 LAB — CBC
HCT: 31.2 % — ABNORMAL LOW (ref 36.0–46.0)
HEMOGLOBIN: 10 g/dL — AB (ref 12.0–15.0)
MCH: 32.5 pg (ref 26.0–34.0)
MCHC: 32.1 g/dL (ref 30.0–36.0)
MCV: 101.3 fL — ABNORMAL HIGH (ref 78.0–100.0)
PLATELETS: 205 10*3/uL (ref 150–400)
RBC: 3.08 MIL/uL — AB (ref 3.87–5.11)
RDW: 17.3 % — ABNORMAL HIGH (ref 11.5–15.5)
WBC: 6.6 10*3/uL (ref 4.0–10.5)

## 2015-01-07 LAB — TROPONIN I: Troponin I: <0.03 ng/mL (ref <0.031)

## 2015-01-07 LAB — SURGICAL PCR SCREEN
MRSA, PCR: NEGATIVE
STAPHYLOCOCCUS AUREUS: NEGATIVE

## 2015-01-07 LAB — GLUCOSE, CAPILLARY
GLUCOSE-CAPILLARY: 202 mg/dL — AB (ref 65–99)
Glucose-Capillary: 50 mg/dL — ABNORMAL LOW (ref 65–99)
Glucose-Capillary: 99 mg/dL (ref 65–99)
Glucose-Capillary: 227 mg/dL — ABNORMAL HIGH (ref 65–99)

## 2015-01-07 MED ORDER — HYDRALAZINE HCL 20 MG/ML IJ SOLN
10.0000 mg | INTRAMUSCULAR | Status: DC | PRN
Start: 2015-01-07 — End: 2015-01-08

## 2015-01-07 MED ORDER — SODIUM CHLORIDE 0.9 % IV SOLN: 1000.0000 mL | INTRAVENOUS | Status: DC

## 2015-01-07 MED ORDER — DEXTROSE 50 % IV SOLN
INTRAVENOUS | Status: AC
  Administered 2015-01-07: 50 mL
  Filled 2015-01-07: qty 50

## 2015-01-07 MED ORDER — SODIUM CHLORIDE 0.9 % IV SOLN: 1000.0000 mL | INTRAVENOUS | Status: AC

## 2015-01-07 NOTE — Care Management Note (Signed)
Case Management Note  Patient Details  Name: Tamara Stephenson MRN: [id number] Date of Birth: 1934/01/24  Subjective/Objective:   81 y/o f admitted w/R hip fx.Awaiting cardio clearance. Ortho following for R hip sx.Post sx await PT recc.Active w/[facility].                 Action/Plan:d/c plan SNF.   Expected Discharge Date:   (UNKNOWN)               Expected Discharge Plan:  Skilled Nursing Facility  In-House Referral:  Clinical Social Work  Discharge planning Services  CM Consult  Post Acute Care Choice:  Home Health (Active w/[facility]/PT/OT) Choice offered to:     DME Arranged:    DME Agency:     HH Arranged:    [employer] Agency:     Status of Service:  In process, will continue to follow  Medicare Important Message Given:    Date Medicare IM Given:    Medicare IM give by:    Date Additional Medicare IM Given:    Additional Medicare Important Message give by:     If discussed at [place] of Stay Meetings, dates discussed:    Additional Comments:  Dessa Phi, RN 01/07/2015, 12:11 PM

## 2015-01-07 NOTE — Consult Note (Signed)
Patient ID: Tamara Stephenson MRN: [id number], DOB/AGE: 1933-10-15   Admit date: 01/06/2015   Primary Physician: Curly Rim, MD Primary Cardiologist: New  Reason for Consult: Surgical Clearance  Pt. Profile:  80 y/o female with h/o HTN, IDDM, cirrhosis of the liver, CKD and recently diagnosed systolic CHF 88/5027 who was admitted for right hip fracture.   Problem List  Past Medical History  Diagnosis Date  . Back pain   . Hypertension   . Carotid stenosis   . Colitis   . Diabetes mellitus without complication [facility])     Past Surgical History  Procedure Laterality Date  . Carotid endarterectomy    . Lumbar laminectomy for epidural abscess N/A 10/16/2013    Procedure: LUMBAR LAMINECTOMY FOR EPIDURAL ABSCESS Lumbar Two through Four;  Surgeon: Charlie Pitter, MD;  Location: [facility] OR[facility];  Service: Neurosurgery;  Laterality: N/A;     Allergies  Allergies  Allergen Reactions  . Peanut-Containing Drug Products Nausea And Vomiting    HPI  The patient is a 81 y/o female who lives independently at an assisted living apartment. She has a known h/o HTN, IDDM, fatty liver disease, cirrhosis of the liver, CKD, 50 + year h/o ongoing tobacco abuse and recently diagnosed systolic CHF, 74/1287, who was admitted for a right hip fracture. Cardiology has been consulted for surgical clearance.   Her HF was diagnosed during a recent admission 12/26/14-12/30/14 for syncope, in the setting of severe hypoglycemia with a CBG of 32. At that time, she had also complained of abdominal pain and subsequent abdominal CT showed evidence of cirrhosis and moderate ascites, a new finding since 2015.   A 2D echo was also obtained revealing reduction in LVEF, down to 35-40%, new compared to 10/2013 when EF was 65-70%. On her most recent echo, wall motion was noted to be normal w/o any regional abnormalities.  Mild LVH and mild AS was also noted. Cardiology was not consulted at that time. Outpatient cardiac  referral was mentioned but no follow-up has taken place.  Last night, patient suffered a mechanical fall at home, resulting in a right hip fracture. She has been evaluated by ortho and surgery is tentatively scheduled for this evening.   The patient denies any h/o chest pain or dyspnea. She is not very active and ambulates around her apartment with the aid of a walker.     Home Medications  Prior to Admission medications   Medication Sig Start Date End Date Taking? Authorizing Provider  acetaminophen (TYLENOL) 500 MG tablet Take 500 mg by mouth every 6 (six) hours as needed for moderate pain.    Yes Historical Provider, MD  aspirin (ASPIRIN EC) 81 MG EC tablet Take 162 mg by mouth at bedtime. Swallow whole.   Yes Historical Provider, MD  buPROPion (WELLBUTRIN XL) 300 MG 24 hr tablet Take 300 mg by mouth daily.   Yes Historical Provider, MD  cholecalciferol (VITAMIN D) 1000 UNITS tablet Take 2,000 Units by mouth daily.    Yes Historical Provider, MD  Cyanocobalamin (VITAMIN B 12) 100 MCG LOZG Take 1 tablet by mouth every other day.    Yes Historical Provider, MD  gabapentin (NEURONTIN) 300 MG capsule Take 1 capsule (300 mg total) by mouth at bedtime. 10/21/13  Yes Marianne L York, PA-C  insulin aspart (NOVOLOG) 100 UNIT/ML injection Inject 3 Units into the skin 3 (three) times daily with meals. 10/21/13  Yes Marianne L York, PA-C  insulin detemir (LEVEMIR) 100 UNIT/ML injection Inject  0.08 mLs (8 Units total) into the skin at bedtime. 12/30/14  Yes Florencia Reasons, MD  lovastatin (MEVACOR) 20 MG tablet Take 20 mg by mouth at bedtime.   Yes Historical Provider, MD  ranitidine (ZANTAC) 300 MG tablet Take 300 mg by mouth 3 times/day as needed-between meals & bedtime for heartburn.    Yes Historical Provider, MD  ursodiol (ACTIGALL) 300 MG capsule Take 300 mg by mouth 3 (three) times daily.   Yes Historical Provider, MD  acidophilus (RISAQUAD) CAPS capsule Take 1 capsule by mouth daily. Patient not taking:  Reported on 01/06/2015 12/30/14   Florencia Reasons, MD  carvedilol (COREG) 6.25 MG tablet Take 1 tablet (6.25 mg total) by mouth 2 (two) times daily with a meal. Patient not taking: Reported on 01/06/2015 12/30/14   Florencia Reasons, MD  cephALEXin (KEFLEX) 500 MG capsule Take 1 capsule (500 mg total) by mouth 2 (two) times daily. Patient not taking: Reported on 01/06/2015 12/30/14   Florencia Reasons, MD  furosemide (LASIX) 40 MG tablet Take 1 tablet (40 mg total) by mouth daily. Patient not taking: Reported on 01/06/2015 12/30/14   Florencia Reasons, MD  lactulose (CHRONULAC) 10 GM/15ML solution Take 15 mLs (10 g total) by mouth 2 (two) times daily. Patient not taking: Reported on 01/06/2015 12/30/14   Florencia Reasons, MD  potassium chloride SA (K-DUR,KLOR-CON) 20 MEQ tablet Take 2 tablets (40 mEq total) by mouth daily. Patient not taking: Reported on 01/06/2015 12/30/14   Florencia Reasons, MD    Family History  Family History  Problem Relation Age of Onset  . Leukemia Mother   . Jaundice Father     Social History  Social History   Social History  . Marital Status: Single    Spouse Name: N/A  . Number of Children: N/A  . Years of Education: N/A   Occupational History  . Not on file.   Social History Main Topics  . Smoking status: Current Every Day Smoker -- 0.20 packs/day  . Smokeless tobacco: Not on file  . Alcohol Use: No  . Drug Use: No  . Sexual Activity: Not on file   Other Topics Concern  . Not on file   Social History Narrative   Used to work as a Chief Executive Officer:  No chills, fever, night sweats or weight changes.  Cardiovascular:  No chest pain, dyspnea on exertion, edema, orthopnea, palpitations, paroxysmal nocturnal dyspnea. Dermatological: No rash, lesions/masses Respiratory: No cough, dyspnea Urologic: No hematuria, dysuria Abdominal:   No nausea, vomiting, diarrhea, bright red blood per rectum, melena, or hematemesis Neurologic:  No visual changes, wkns, changes in mental  status. All other systems reviewed and are otherwise negative except as noted above.  Physical Exam  Blood pressure 168/88, pulse 68, temperature 98.5 F (36.9 C), temperature source Oral, resp. rate 18, height 5\' 6"  (1.676 m), weight 188 lb 15 oz (85.7 kg), SpO2 93 %.  General: Pleasant, NAD Psych: Normal affect. Neuro: Alert and oriented X 3. Moves all extremities spontaneously. HEENT: Normal  Neck: Supple without bruits or JVD. Lungs:  Resp regular and unlabored, CTA. Heart: RRR no s3, s4, or murmurs. Abdomen: Soft, non-tender, non-distended, BS + x 4.  Extremities: No clubbing, cyanosis or edema. DP/PT/Radials 2+ and equal bilaterally.  Labs  Troponin (Point of Care Test) No results for input(s): TROPIPOC in the last 72 hours.  Recent Labs  01/06/15 1605 01/06/15 2115 01/07/15 0235  TROPONINI <0.03 <0.03 <0.03  Lab Results  Component Value Date   WBC 6.6 01/07/2015   HGB 10.0* 01/07/2015   HCT 31.2* 01/07/2015   MCV 101.3* 01/07/2015   PLT 205 01/07/2015    Recent Labs Lab 01/06/15 1605 01/07/15 0235  NA  --  137  K  --  3.9  CL  --  107  CO2  --  28  BUN  --  16  CREATININE  --  1.17*  CALCIUM  --  7.9*  PROT 8.1  --   BILITOT 1.6*  --   ALKPHOS 187*  --   ALT 20  --   AST 41  --   GLUCOSE  --  86   Lab Results  Component Value Date   CHOL 96 12/30/2014   HDL 21* 12/30/2014   LDLCALC 62 12/30/2014   TRIG 67 12/30/2014   No results found for: DDIMER   Radiology/Studies  Ct Abdomen Pelvis Wo Contrast  12/25/2014  CLINICAL DATA:  Generalized abdominal pain EXAM: CT ABDOMEN AND PELVIS WITHOUT CONTRAST TECHNIQUE: Multidetector CT imaging of the abdomen and pelvis was performed following the standard protocol without IV contrast. COMPARISON:  10/12/2013 FINDINGS: Lower chest and abdominal wall: Small bilateral pleural effusion which is new. Chronic umbilical and supraumbilical hernias containing fat and now with ascitic fluid. Hepatobiliary:  Cirrhotic liver morphology without gross mass lesion. There is new moderate ascites. No splenomegaly.Partial but diffuse mural calcification of the gallbladder wall without typical layering stone. No gallbladder inflammation or over distension. Pancreas: Unremarkable. Spleen: Unremarkable. Adrenals/Urinary Tract: Stable low-density lobulation of the adrenals, left more than right, consistent with adenomas. Left lower pole 3 mm calculus. No hydronephrosis. Unremarkable bladder. Reproductive:No pathologic findings. Stomach/Bowel:  No obstruction. No appendicitis. Vascular/Lymphatic: No acute vascular abnormality. Enlarged deep liver drainage lymph nodes, nonspecific and usually reactive in the setting of cirrhosis. Musculoskeletal: Severe and diffuse disc degeneration and facet arthropathy with lumbar levoscoliosis. Multilevel lumbar laminectomy. No acute osseous finding. IMPRESSION: 1. Cirrhosis with moderate ascites. Ascites has developed since 2015. 2. The questioned umbilical and supraumbilical hernias are chronic and stable other than new ascitic fluid. 3. Small pleural effusions. 4. Porcelain gallbladder. 5. Left nephrolithiasis. Electronically Signed   By: Monte Fantasia M.D.   On: 12/25/2014 17:15   Dg Chest 2 View  12/29/2014  CLINICAL DATA:  Cough, followup, hypertension, diabetes mellitus, smoker EXAM: CHEST  2 VIEW COMPARISON:  12/26/2014 FINDINGS: Minimal enlargement of cardiac silhouette. Tortuous aorta. Slight rotation to the RIGHT. Pulmonary vascularity normal. Peribronchial thickening without pulmonary infiltrate or pneumothorax. Small pleural effusions blunt the posterior costophrenic angles bilaterally. Bones appear diffusely demineralized. RIGHT AC joint degenerative changes. IMPRESSION: Bronchitic changes with small bibasilar pleural effusions. No definite acute infiltrate. Electronically Signed   By: Lavonia Dana M.D.   On: 12/29/2014 16:15   Dg Chest 2 View  12/26/2014  CLINICAL DATA:   Productive cough, syncope. EXAM: CHEST  2 VIEW COMPARISON:  September 19, 2014. FINDINGS: Stable cardiomediastinal silhouette. No pneumothorax is noted. Minimal bilateral posterior pleural effusions are noted. Mild central pulmonary vascular congestion is noted with possible bilateral perihilar edema. Narrowing of the subacromial space is noted bilaterally consistent with rotator cuff injury. IMPRESSION: Mild central pulmonary vascular congestion is noted with possible bilateral perihilar edema. Minimal bilateral pleural effusions are noted. Electronically Signed   By: Marijo Conception, M.D.   On: 12/26/2014 21:07   Ct Head Wo Contrast  12/26/2014  CLINICAL DATA:  Syncope, hypoglycemia EXAM: CT HEAD WITHOUT CONTRAST TECHNIQUE:  Contiguous axial images were obtained from the base of the skull through the vertex without intravenous contrast. COMPARISON:  11/05/2014 FINDINGS: Motion degraded images. No evidence of parenchymal hemorrhage or extra-axial fluid collection. No mass lesion, mass effect, or midline shift. No CT evidence of acute infarction. Subcortical white matter and periventricular small vessel ischemic changes. Intracranial atherosclerosis. Global cortical and central atrophy. Secondary ventricular prominence. The visualized paranasal sinuses are essentially clear. The mastoid air cells are unopacified. No evidence of calvarial fracture. IMPRESSION: Motion degraded images. No evidence of acute intracranial abnormality. Atrophy with small vessel ischemic changes. Electronically Signed   By: Julian Hy M.D.   On: 12/26/2014 20:52   Chest Portable 1 View  01/06/2015  CLINICAL DATA:  Preoperative chest x-ray. Patient is a current smoker. EXAM: PORTABLE CHEST 1 VIEW COMPARISON:  December 29, 2014 FINDINGS: The heart size and mediastinal contours are stable. The heart size is enlarged. Mild increased pulmonary markings are identified bilaterally, probably chronic. There is no focal pneumonia pulmonary  edema or pleural effusion. The visualized skeletal structures are stable P IMPRESSION: No active cardiopulmonary disease. Mild increased pulmonary markings identified bilaterally, probably chronic. Electronically Signed   By: Abelardo Diesel M.D.   On: 01/06/2015 17:10   Dg Hip Unilat With Pelvis 2-3 Views Right  01/06/2015  CLINICAL DATA:  Right hip pain.  Fall from wheelchair. EXAM: DG HIP (WITH OR WITHOUT PELVIS) 2-3V RIGHT COMPARISON:  None. FINDINGS: There is a right femoral neck fracture with varus angulation. No subluxation or dislocation. Mild degenerative changes in the hips bilaterally. IMPRESSION: Right femoral neck fracture with varus angulation. Electronically Signed   By: Rolm Baptise M.D.   On: 01/06/2015 11:45    ECG  Sinus rhythm 67 bpm Borderline intraventricular conduction delay Nonspecific T abnormalities, lateral leads Prolonged QT interval  Echocardiogram 12/27/14  Study Conclusions  - Left ventricle: The cavity size was normal. Wall thickness was increased in a pattern of mild LVH. Systolic function was moderately reduced. The estimated ejection fraction was in the range of 35% to 40%. Wall motion was normal; there were no regional wall motion abnormalities. - Aortic valve: Mildly calcified annulus. Moderately thickened, moderately calcified leaflets. There was mild stenosis. Valve area (Vmax): 1.61 cm^2. - Mitral valve: Mildly calcified annulus. - Left atrium: The atrium was mildly dilated. - Right atrium: The atrium was mildly dilated.    ASSESSMENT AND PLAN  Active Problems:   CKD (chronic kidney disease) stage 3, GFR 30-59 ml/min   Anemia of chronic disease   Diabetes mellitus with renal manifestations, uncontrolled (HCC)   Obesity, Class I, BMI 30-34.9   Syncope   Cirrhosis of liver with ascites (HCC)   Hip fracture ([place])   1. Surgical Clearance: Patient with newly discovered systolic HF of unknown etiology. 2D echo showed reduced EF of  35-40% (55-60% in 2015). Her EKG shows borderline intraventricular conduction delay with nonspecific T abnormalities in the lateral leads. She also has multiple cardiac risk factors including a 50 + h/o ongoing tobacco abuse, IDDM and HTN. She denies symptoms of chest pain and dyspnea however, given that she is elderly, female and a diabetic, there is concern for potential silent ischemia. She has not undergone any ischemic w/u since her new diagnosis, thus we cannot fully determine her surgical risk at this point.  Dr. Radford Pax to evaluate today to determine possibility of chemical stress test vs possible diagnostic cath to rule out underlying coronary ischemia prior to surgery.    Signed,  Janney Priego, PA-C 01/07/2015, 8:03 AM

## 2015-01-07 NOTE — NC FL2 (Deleted)
MEDICAID FL2 LEVEL OF CARE SCREENING TOOL     IDENTIFICATION  Patient Name: Tamara Stephenson Birthdate: [birth year] Sex: female Admission Date (Current Location): 01/06/2015  [facility] and [place] Number: [place] and Address:  [facility],  [place] [address], [place]      Provider Number: [id number]  Attending Physician Name and Address:  Theodis Blaze, MD  Relative Name and Phone Number:       Current Level of Care: Hospital Recommended Level of Care: [place] Prior Approval Number:    Date Approved/Denied:   PASRR Number:    Discharge Plan: SNF    Current Diagnoses: Patient Active Problem List   Diagnosis Date Noted  . Hip fracture ([place]) 01/06/2015  . Edema   . Insulin dependent diabetes mellitus ([place])   . Hallucinations, visual 12/26/2014  . Syncope 12/26/2014  . Hypoglycemia 12/26/2014  . Hepatic encephalopathy ([place]) 12/26/2014  . Cirrhosis of liver with ascites ([place]) 12/26/2014  . Reactive airway disease 12/26/2014  . CKD (chronic kidney disease) stage 3, GFR 30-59 ml/min 09/16/2014  . Anemia of chronic disease 09/16/2014  . Anemia, iron deficiency 09/16/2014  . Dyslipidemia 09/16/2014  . Diabetes mellitus with renal manifestations, uncontrolled ([place]) 09/16/2014  . Fatty liver 09/16/2014  . Obesity, Class I, BMI 30-34.9 09/16/2014  . Diabetic nephropathy ([place]) 09/16/2014  . Depression 09/16/2014  . Pressure ulcer with abrasion, blister, partial thickness skin loss involving epidermis and/or dermis 09/16/2014  . Cellulitis of right upper extremity 09/15/2014  . Hypokalemia 09/15/2014    Orientation ACTIVITIES/SOCIAL BLADDER RESPIRATION    Self, Time, Situation, Place  Active Continent Normal  BEHAVIORAL SYMPTOMS/MOOD NEUROLOGICAL BOWEL NUTRITION STATUS      Continent    PHYSICIAN VISITS COMMUNICATION OF NEEDS Height & Weight Skin    Verbally 5\' 6"  (167.6 cm) 188 lbs. Normal          AMBULATORY  STATUS RESPIRATION    Assist extensive Normal      Personal Care Assistance Level of Assistance  Bathing, Dressing Bathing Assistance: Limited assistance   Dressing Assistance: Limited assistance      Functional Limitations Info                SPECIAL CARE FACTORS FREQUENCY  PT (By licensed PT), OT (By licensed OT)     PT Frequency: 5 OT Frequency: 5           Additional Factors Info  Code Status Code Status Info: dnr             Current Medications (01/07/2015): Current Facility-Administered Medications  Medication Dose Route Frequency Provider Last Rate Last Dose  . 0.9 %  sodium chloride infusion  1,000 mL Intravenous Continuous Dorie Rank, MD 125 mL/hr at 01/07/15 0725 1,000 mL at 01/07/15 0725  . aspirin EC tablet 162 mg  162 mg Oral QHS Belkys A Regalado, MD   162 mg at 01/06/15 2256  . buPROPion (WELLBUTRIN XL) 24 hr tablet 300 mg  300 mg Oral Daily Belkys A Regalado, MD   300 mg at 01/06/15 1800  . carvedilol (COREG) tablet 6.25 mg  6.25 mg Oral BID WC Belkys A Regalado, MD   6.25 mg at 01/07/15 0739  . ceFAZolin (ANCEF) IVPB 2 g/50 mL premix  2 g Intravenous To OR Rod Can, MD      . cephALEXin (KEFLEX) capsule 500 mg  500 mg Oral BID Belkys A Regalado, MD   500 mg at  01/06/15 1800  . docusate sodium (COLACE) capsule 100 mg  100 mg Oral BID Belkys A Regalado, MD   100 mg at 01/06/15 2256  . furosemide (LASIX) tablet 40 mg  40 mg Oral Daily Belkys A Regalado, MD   40 mg at 01/06/15 1846  . gabapentin (NEURONTIN) capsule 300 mg  300 mg Oral QHS Belkys A Regalado, MD   300 mg at 01/06/15 2256  . hydrALAZINE (APRESOLINE) injection 10 mg  10 mg Intravenous Q8H PRN Belkys A Regalado, MD      . HYDROcodone-acetaminophen (NORCO/VICODIN) 5-325 MG per tablet 1-2 tablet  1-2 tablet Oral Q6H PRN Belkys A Regalado, MD      . insulin aspart (novoLOG) injection 0-9 Units  0-9 Units Subcutaneous TID WC Belkys A Regalado, MD   1 Units at 01/06/15 1700  . lactulose  (CHRONULAC) 10 GM/15ML solution 10 g  10 g Oral BID Belkys A Regalado, MD   10 g at 01/06/15 2256  . morphine 2 MG/ML injection 0.5 mg  0.5 mg Intravenous Q2H PRN Belkys A Regalado, MD   0.5 mg at 01/07/15 0749  . potassium chloride SA (K-DUR,KLOR-CON) CR tablet 40 mEq  40 mEq Oral Daily Belkys A Regalado, MD   40 mEq at 01/06/15 1800  . ursodiol (ACTIGALL) capsule 300 mg  300 mg Oral TID Belkys A Regalado, MD   300 mg at 01/06/15 2256   Do not use this list as official medication orders. Please verify with discharge summary.  Discharge Medications:   Medication List    ASK your doctor about these medications        acetaminophen 500 MG tablet  Commonly known as:  TYLENOL  Take 500 mg by mouth every 6 (six) hours as needed for moderate pain.     acidophilus Caps capsule  Take 1 capsule by mouth daily.     aspirin EC 81 MG EC tablet  Generic drug:  aspirin  Take 162 mg by mouth at bedtime. Swallow whole.     buPROPion 300 MG 24 hr tablet  Commonly known as:  WELLBUTRIN XL  Take 300 mg by mouth daily.     carvedilol 6.25 MG tablet  Commonly known as:  COREG  Take 1 tablet (6.25 mg total) by mouth 2 (two) times daily with a meal.     cephALEXin 500 MG capsule  Commonly known as:  KEFLEX  Take 1 capsule (500 mg total) by mouth 2 (two) times daily.     cholecalciferol 1000 UNITS tablet  Commonly known as:  VITAMIN D  Take 2,000 Units by mouth daily.     furosemide 40 MG tablet  Commonly known as:  LASIX  Take 1 tablet (40 mg total) by mouth daily.     gabapentin 300 MG capsule  Commonly known as:  NEURONTIN  Take 1 capsule (300 mg total) by mouth at bedtime.     insulin aspart 100 UNIT/ML injection  Commonly known as:  novoLOG  Inject 3 Units into the skin 3 (three) times daily with meals.     insulin detemir 100 UNIT/ML injection  Commonly known as:  LEVEMIR  Inject 0.08 mLs (8 Units total) into the skin at bedtime.     lactulose 10 GM/15ML solution  Commonly  known as:  CHRONULAC  Take 15 mLs (10 g total) by mouth 2 (two) times daily.     lovastatin 20 MG tablet  Commonly known as:  MEVACOR  Take 20 mg by mouth  at bedtime.     potassium chloride SA 20 MEQ tablet  Commonly known as:  K-DUR,KLOR-CON  Take 2 tablets (40 mEq total) by mouth daily.     ranitidine 300 MG tablet  Commonly known as:  ZANTAC  Take 300 mg by mouth 3 times/day as needed-between meals & bedtime for heartburn.     ursodiol 300 MG capsule  Commonly known as:  ACTIGALL  Take 300 mg by mouth 3 (three) times daily.     Vitamin B 12 100 MCG Lozg  Take 1 tablet by mouth every other day.        Relevant Imaging Results:  Relevant Lab Results:  Recent Labs    Additional Information  SSN [id number]  Ludwig Clarks, LCSW

## 2015-01-07 NOTE — NC FL2 (Deleted)
[place] MEDICAID FL2 LEVEL OF CARE SCREENING TOOL     IDENTIFICATION  Patient Name: Tamara Stephenson Birthdate: 1933/03/16 Sex: female Admission Date (Current Location): 01/06/2015  [facility] and [place] Number: [place] and Address:  [facility],  [place] [address], [place]      Provider Number: [id number]  Attending Physician Name and Address:  Theodis Blaze, MD  Relative Name and Phone Number:       Current Level of Care: Hospital Recommended Level of Care: [place] Prior Approval Number:    Date Approved/Denied:   PASRR Number:   [id number]  Discharge Plan: SNF    Current Diagnoses: Patient Active Problem List   Diagnosis Date Noted  . Hip fracture ([place]) 01/06/2015  . Edema   . Insulin dependent diabetes mellitus ([place])   . Hallucinations, visual 12/26/2014  . Syncope 12/26/2014  . Hypoglycemia 12/26/2014  . Hepatic encephalopathy ([place]) 12/26/2014  . Cirrhosis of liver with ascites ([place]) 12/26/2014  . Reactive airway disease 12/26/2014  . CKD (chronic kidney disease) stage 3, GFR 30-59 ml/min 09/16/2014  . Anemia of chronic disease 09/16/2014  . Anemia, iron deficiency 09/16/2014  . Dyslipidemia 09/16/2014  . Diabetes mellitus with renal manifestations, uncontrolled ([place]) 09/16/2014  . Fatty liver 09/16/2014  . Obesity, Class I, BMI 30-34.9 09/16/2014  . Diabetic nephropathy ([place]) 09/16/2014  . Depression 09/16/2014  . Pressure ulcer with abrasion, blister, partial thickness skin loss involving epidermis and/or dermis 09/16/2014  . Cellulitis of right upper extremity 09/15/2014  . Hypokalemia 09/15/2014    Orientation ACTIVITIES/SOCIAL BLADDER RESPIRATION    Self, Time, Situation, Place  Active Continent Normal  BEHAVIORAL SYMPTOMS/MOOD NEUROLOGICAL BOWEL NUTRITION STATUS      Continent    PHYSICIAN VISITS COMMUNICATION OF NEEDS Height & Weight Skin    Verbally 5\' 6"  (167.6 cm) 188 lbs. Normal           AMBULATORY STATUS RESPIRATION    Assist extensive Normal      Personal Care Assistance Level of Assistance  Bathing, Dressing Bathing Assistance: Limited assistance   Dressing Assistance: Limited assistance      Functional Limitations Info                SPECIAL CARE FACTORS FREQUENCY  PT (By licensed PT), OT (By licensed OT)     PT Frequency: 5 OT Frequency: 5           Additional Factors Info  Code Status Code Status Info: dnr             Current Medications (01/07/2015): Current Facility-Administered Medications  Medication Dose Route Frequency Provider Last Rate Last Dose  . 0.9 %  sodium chloride infusion  1,000 mL Intravenous Continuous Dorie Rank, MD 125 mL/hr at 01/07/15 0725 1,000 mL at 01/07/15 0725  . aspirin EC tablet 162 mg  162 mg Oral QHS Belkys A Regalado, MD   162 mg at 01/06/15 2256  . buPROPion (WELLBUTRIN XL) 24 hr tablet 300 mg  300 mg Oral Daily Belkys A Regalado, MD   300 mg at 01/06/15 1800  . carvedilol (COREG) tablet 6.25 mg  6.25 mg Oral BID WC Belkys A Regalado, MD   6.25 mg at 01/07/15 0739  . ceFAZolin (ANCEF) IVPB 2 g/50 mL premix  2 g Intravenous To OR Rod Can, MD      . cephALEXin (KEFLEX) capsule 500 mg  500 mg Oral BID Belkys A Regalado, MD   500 mg  at 01/06/15 1800  . docusate sodium (COLACE) capsule 100 mg  100 mg Oral BID Belkys A Regalado, MD   100 mg at 01/06/15 2256  . furosemide (LASIX) tablet 40 mg  40 mg Oral Daily Belkys A Regalado, MD   40 mg at 01/06/15 1846  . gabapentin (NEURONTIN) capsule 300 mg  300 mg Oral QHS Belkys A Regalado, MD   300 mg at 01/06/15 2256  . hydrALAZINE (APRESOLINE) injection 10 mg  10 mg Intravenous Q8H PRN Belkys A Regalado, MD      . HYDROcodone-acetaminophen (NORCO/VICODIN) 5-325 MG per tablet 1-2 tablet  1-2 tablet Oral Q6H PRN Belkys A Regalado, MD      . insulin aspart (novoLOG) injection 0-9 Units  0-9 Units Subcutaneous TID WC Belkys A Regalado, MD   1 Units at 01/06/15 1700  .  lactulose (CHRONULAC) 10 GM/15ML solution 10 g  10 g Oral BID Belkys A Regalado, MD   10 g at 01/06/15 2256  . morphine 2 MG/ML injection 0.5 mg  0.5 mg Intravenous Q2H PRN Belkys A Regalado, MD   0.5 mg at 01/07/15 0749  . potassium chloride SA (K-DUR,KLOR-CON) CR tablet 40 mEq  40 mEq Oral Daily Belkys A Regalado, MD   40 mEq at 01/06/15 1800  . ursodiol (ACTIGALL) capsule 300 mg  300 mg Oral TID Belkys A Regalado, MD   300 mg at 01/06/15 2256   Do not use this list as official medication orders. Please verify with discharge summary.  Discharge Medications:   Medication List    ASK your doctor about these medications        acetaminophen 500 MG tablet  Commonly known as:  TYLENOL  Take 500 mg by mouth every 6 (six) hours as needed for moderate pain.     acidophilus Caps capsule  Take 1 capsule by mouth daily.     aspirin EC 81 MG EC tablet  Generic drug:  aspirin  Take 162 mg by mouth at bedtime. Swallow whole.     buPROPion 300 MG 24 hr tablet  Commonly known as:  WELLBUTRIN XL  Take 300 mg by mouth daily.     carvedilol 6.25 MG tablet  Commonly known as:  COREG  Take 1 tablet (6.25 mg total) by mouth 2 (two) times daily with a meal.     cephALEXin 500 MG capsule  Commonly known as:  KEFLEX  Take 1 capsule (500 mg total) by mouth 2 (two) times daily.     cholecalciferol 1000 UNITS tablet  Commonly known as:  VITAMIN D  Take 2,000 Units by mouth daily.     furosemide 40 MG tablet  Commonly known as:  LASIX  Take 1 tablet (40 mg total) by mouth daily.     gabapentin 300 MG capsule  Commonly known as:  NEURONTIN  Take 1 capsule (300 mg total) by mouth at bedtime.     insulin aspart 100 UNIT/ML injection  Commonly known as:  novoLOG  Inject 3 Units into the skin 3 (three) times daily with meals.     insulin detemir 100 UNIT/ML injection  Commonly known as:  LEVEMIR  Inject 0.08 mLs (8 Units total) into the skin at bedtime.     lactulose 10 GM/15ML solution   Commonly known as:  CHRONULAC  Take 15 mLs (10 g total) by mouth 2 (two) times daily.     lovastatin 20 MG tablet  Commonly known as:  MEVACOR  Take 20 mg by  mouth at bedtime.     potassium chloride SA 20 MEQ tablet  Commonly known as:  K-DUR,KLOR-CON  Take 2 tablets (40 mEq total) by mouth daily.     ranitidine 300 MG tablet  Commonly known as:  ZANTAC  Take 300 mg by mouth 3 times/day as needed-between meals & bedtime for heartburn.     ursodiol 300 MG capsule  Commonly known as:  ACTIGALL  Take 300 mg by mouth 3 (three) times daily.     Vitamin B 12 100 MCG Lozg  Take 1 tablet by mouth every other day.        Relevant Imaging Results:  Relevant Lab Results:  Recent Labs    Additional Information    Ludwig Clarks, LCSW

## 2015-01-07 NOTE — Progress Notes (Addendum)
Patient ID: Tamara Stephenson, female   DOB: [birth year], 81 y.o.   MRN: [id number]  [facility] PROGRESS NOTE  Tamara Stephenson WUJ:[id number] DOB: [birth year] DOA: 01/06/2015 PCP: Curly Rim, MD   Brief narrative:    81 y.o. female with HTN, Liver disease, cirrhosis by CT scan, Diabetes, Systolic HF ef 35 -40 % by ECHO 12-2014, last admission to hospital for syncope secondary to hypoglycemia, presented to [facility] ED after an episode of fall at home while she was trying to get up from bed to wheelchair. She has denies chest pain or shortness of breath, no abd or urinary  concerns.   In ED, pt noted to be hemodynamically stable, VS notable for SBP in 180's otherwise stable, blood worn notable for Cr 1.17, Hg 10. Imaging studies notable for right femoral neck fracture. Ortho team consulted as well as cardiology team to provide clearance.   Assessment/Plan:    Principal Problem:   Hip fracture ([place]), right femoral neck, initial encounter  - after what appears to be mechanical fall - plan to take to OR in AM after cardio clears for surgery  Active Problems:   Preoperative cardiovascular examination - plan for stress test in am prior to surgery  - appreciate cardiology following    Cardiomyopathy [facility]), chronic systolic CHF - management per cardiology  - 2D echo showed reduced EF of 35-40% (55-60% in 2015) - pt on Lasix, pt also on IVF, d/w cardio if need to stop IVF (will lower the rate from 125 cc to 75 cc for now) - daily weights, I/O - wright is 85 kg this AM    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, uncontrolled ([place]) - continue with SSI for now - CBG's stable    CKD (chronic kidney disease) stage 3, GFR 30-59 ml/min - Cr is at baseline - BMP in AM    Anemia of chronic disease, CKD - Hg at baseline, no signs of bleeding - CBC in AM    Obesity, Class I, BMI 30-34.9    Diabetic nephropathy ([place]) - continue Gabapentin     Cirrhosis of liver with ascites  ([place]) - outpatient follow up  DVT prophylaxis - aspirin 163 mg QD  Code Status: DNR Family Communication:  plan of care discussed with the patient Disposition Plan: To be determined   IV access:  Peripheral IV  Procedures and diagnostic studies:    Chest Portable 1 View 01/06/2015  No active cardiopulmonary disease. Mild increased pulmonary markings identified bilaterally, probably chronic.   Dg Hip Unilat With Pelvis 2-3 Views Right 01/06/2015 Right femoral neck fracture with varus angulation.   Medical Consultants:  Ortho  Cardiology for medical clearance   Other Consultants:  PT  IAnti-Infectives:   Keflex for recent UTI  Faye Ramsay, MD  [facility] Pager [phone]  If 7PM-7AM, please contact night-coverage [email or web address] Password [email or web address] 01/07/2015, 5:02 PM   LOS: 1 day   HPI/Subjective: No events overnight.   Objective: Filed Vitals:   01/06/15 2146 01/07/15 0551 01/07/15 0750 01/07/15 1335  BP: 158/81 143/70 168/88 176/81  Pulse: 65 60 68 66  Temp: 97.9 F (36.6 C) 98.5 F (36.9 C)  98.6 F (37 C)  TempSrc: Oral Oral  Oral  Resp: 20 18  17   Height:      Weight:  85.7 kg (188 lb 15 oz)    SpO2: 95% 93%  95%    Intake/Output Summary (Last 24 hours) at 01/07/15 1702 Last data filed at 01/07/15 1653  Gross  per 24 hour  Intake 3027.5 ml  Output   4600 ml  Net -1572.5 ml    Exam:   General:  Pt is alert, follows commands appropriately, not in acute distress  Cardiovascular: Regular rate and rhythm, no rubs, no gallops  Respiratory: Clear to auscultation bilaterally, no wheezing, no crackles, no rhonchi  Abdomen: Soft, non tender, non distended, bowel sounds present, no guarding   Data Reviewed: Basic Metabolic Panel:  Recent Labs Lab 01/06/15 1212 01/06/15 1605 01/07/15 0235  NA 137  --  137  K 3.9  --  3.9  CL 106  --  107  CO2 26  --  28  GLUCOSE 159*  --  86  BUN 17  --  16  CREATININE 1.28*  --  1.17*  CALCIUM 8.5*  --  7.9*   MG  --  1.8  --    Liver Function Tests:  Recent Labs Lab 01/06/15 1605  AST 41  ALT 20  ALKPHOS 187*  BILITOT 1.6*  PROT 8.1  ALBUMIN 2.7*   CBC:  Recent Labs Lab 01/06/15 1212 01/07/15 0235  WBC 7.0 6.6  NEUTROABS 4.9  --   HGB 10.7* 10.0*  HCT 32.4* 31.2*  MCV 101.6* 101.3*  PLT 213 205   Cardiac Enzymes:  Recent Labs Lab 01/06/15 1605 01/06/15 2115 01/07/15 0235  TROPONINI <0.03 <0.03 <0.03   BNP: Invalid input(s): POCBNP CBG:  Recent Labs Lab 01/06/15 1756 01/06/15 2150 01/07/15 0729 01/07/15 1137 01/07/15 1647  GLUCAP 141* 119* 50* 99 202*    Recent Results (from the past 240 hour(s))  Surgical pcr screen     Status: None   Collection Time: 01/07/15  2:47 AM  Result Value Ref Range Status   MRSA, PCR NEGATIVE NEGATIVE Final   Staphylococcus aureus NEGATIVE NEGATIVE Final    Comment:        The Xpert SA Assay (FDA approved for NASAL specimens in patients over 5 years of age), is one component of a comprehensive surveillance program.  Test performance has been validated by [facility] for patients greater than or equal to 52 year old. It is not intended to diagnose infection nor to guide or monitor treatment.      Scheduled Meds: . aspirin EC  162 mg Oral QHS  . buPROPion  300 mg Oral Daily  . carvedilol  6.25 mg Oral BID WC  .  ceFAZolin (ANCEF) IV  2 g Intravenous To OR  . cephALEXin  500 mg Oral BID  . docusate sodium  100 mg Oral BID  . furosemide  40 mg Oral Daily  . gabapentin  300 mg Oral QHS  . insulin aspart  0-9 Units Subcutaneous TID WC  . lactulose  10 g Oral BID  . potassium chloride SA  40 mEq Oral Daily  . ursodiol  300 mg Oral TID   Continuous Infusions: . sodium chloride 1,000 mL (01/07/15 0725)

## 2015-01-07 NOTE — Progress Notes (Signed)
Initial Nutrition Assessment  DOCUMENTATION CODES:   Obesity unspecified  INTERVENTION:  - Diet advancement as medically feasible - RD will continue to monitor for needs  NUTRITION DIAGNOSIS:   Inadequate oral intake related to inability to eat as evidenced by NPO status.  GOAL:   Patient will meet greater than or equal to 90% of their needs  MONITOR:   Diet advancement, Weight trends, Labs, Skin, I & O's  REASON FOR ASSESSMENT:   Consult Assessment of nutrition requirement/status  ASSESSMENT:   81 y.o. female with PMH significant for HTN, Liver diseases, cirrhosis by CT scan, Diabetes, Systolic HF ef 35 -40 % by ECHO 12-2014, last admission to hospital for Syncope secondary to hypoglycemia, UTI who presents today after a fall. Patient thinks she was trying to move from bed to her wheelchair when she fell on the floor. Last thing she recall, she wake up almost 6 hours after. She call for help. She denies chest pain, or dyspnea. She able able to use a walker at her house, but with difficulty due to her knees problems.   Pt seen for consult. BMI indicates obesity. Pt is NPO; she was admitted for hip fx and has made the decision to proceed with surgery.  She states "ravenous" appetite PTA although in the past few weeks she was making an attempt to eat smaller portions in an effort to lose weight. She is unsure of UBW or amount of weight she may have lost. Per chart review, pt has lost 22 lbs (10.5% body weight) in the past 2 months which is significant for time frame. Pt does have some mild edema and question if some of this weight loss was also fluid related. She was not consuming nutrition supplements PTA. No muscle or fat wasting noted; mild edema to BLE.   Pt states that for the past few weeks she has had some pain in her throat when she wakes up and late at night.  Unable to meet needs at this time. Medications reviewed. Labs reviewed; CBGs: 50-162 mg/dL, creatinine elevated,  Ca: 7.9 mg/dL, gfr: 43.   Diet Order:  Diet NPO time specified  Skin:  Reviewed, no issues  Last BM:  10/31  Height:   Ht Readings from Last 1 Encounters:  01/06/15 5\' 6"  (1.676 m)    Weight:   Wt Readings from Last 1 Encounters:  01/07/15 188 lb 15 oz (85.7 kg)    Ideal Body Weight:  59.09 kg (kg)  BMI:  Body mass index is 30.51 kg/(m^2).  Estimated Nutritional Needs:   Kcal:  6734-1937  Protein:  68-80 grams  Fluid:  2 L/day  EDUCATION NEEDS:   No education needs identified at this time     Jarome Matin, RD, LDN Inpatient Clinical Dietitian Pager # [phone] After hours/weekend pager # [phone]

## 2015-01-07 NOTE — Clinical Social Work Note (Signed)
Clinical Social Work Assessment  Patient Details  Name: Tamara Stephenson MRN: [id number] Date of Birth: 10-29-1933  Date of referral:  01/07/15               Reason for consult:  Facility Placement                Permission sought to share information with:  Other Permission granted to share information::  Yes, Verbal Permission Granted  Name::     Carmelina Paddock- [phone]  Agency::     Relationship::     Contact Information:     Housing/Transportation Living arrangements for the past 2 months:  [place] of Information:  Patient Patient Interpreter Needed:  None Criminal Activity/Legal Involvement Pertinent to Current Situation/Hospitalization:  No - Comment as needed Significant Relationships:  [employer] Lives with:  Self Do you feel safe going back to the place where you live?  No Need for family participation in patient care:  No (Coment)  Care giving concerns:  Patient admitted from [place] apartment at [facility]. Anticipate SNF at dc due to hip fx.   Social Worker assessment / plan:  CSW met with patient who reports she has lived in [place] apartment at [facility] for about 1.5 years. She also reports all her family is in [place] but Carmelina Paddock is her local support- per RN, stayed at her bedside last night. Patient understands and agrees to SNF unit at [facility] and SNF bed has been confirmed for her at dc.  CSW will begin FL2 and PASRR for SNF post op.  Employment status:  Retired Nurse, adult PT Recommendations:  [place] / Referral to community resources:  [place]  Patient/Family's Response to care:  Patient understands plans for probable surgery. Her hip is hurting and she is asking for pain meds- RN advised.   Patient/Family's Understanding of and Emotional Response to Diagnosis, Current Treatment, and Prognosis:  Patient understands the plan of care- she is  comfortable with these plans and for SNF rehab unit at dc. She is [place] alone with her "2 cats".   Emotional Assessment Appearance:  Developmentally appropriate Attitude/Demeanor/Rapport:    Affect (typically observed):  Appropriate Orientation:  Oriented to Self, Oriented to Place, Oriented to  Time, Oriented to Situation Alcohol / Substance use:  Not Applicable Psych involvement (Current and /or in the community):  No (Comment)  Discharge Needs  Concerns to be addressed:  No discharge needs identified Readmission within the last 30 days:  No Current discharge risk:  None Barriers to Discharge:      Ludwig Clarks, LCSW 01/07/2015, 10:23 AM

## 2015-01-07 NOTE — Progress Notes (Signed)
VASCULAR LAB PRELIMINARY  PRELIMINARY  PRELIMINARY  PRELIMINARY  Carotid duplex  completed.    Preliminary report:  Bilateral:  1-39% ICA stenosis.  Vertebral artery flow is antegrade.      Rodrickus Min, RVT 01/07/2015, 3:16 PM

## 2015-01-07 NOTE — Progress Notes (Signed)
cardiology consult noted. Will reschedule surgery for tomorrow afternoon pending clearance.

## 2015-01-08 ENCOUNTER — Inpatient Hospital Stay (HOSPITAL_COMMUNITY)
Admit: 2015-01-08 | Discharge: 2015-01-08 | Disposition: A | Discharge status: Home / Self Care | Payer: Medicare HMO | Attending: Cardiology | Admitting: Cardiology

## 2015-01-08 ENCOUNTER — Inpatient Hospital Stay (HOSPITAL_COMMUNITY): Payer: Medicare HMO | Admitting: Anesthesiology

## 2015-01-08 ENCOUNTER — Inpatient Hospital Stay (HOSPITAL_COMMUNITY): Payer: Medicare HMO

## 2015-01-08 ENCOUNTER — Encounter (HOSPITAL_COMMUNITY): Payer: Self-pay | Admitting: Certified Registered Nurse Anesthetist

## 2015-01-08 ENCOUNTER — Encounter (HOSPITAL_COMMUNITY)
Admission: EM | Disposition: A | Discharge status: Skilled Nursing Facility | Payer: Self-pay | Source: Home / Self Care | Attending: Internal Medicine

## 2015-01-08 ENCOUNTER — Ambulatory Visit (HOSPITAL_COMMUNITY)
Admit: 2015-01-08 | Discharge: 2015-01-08 | Disposition: A | Discharge status: Home / Self Care | Payer: Medicare HMO | Attending: Cardiology | Admitting: Cardiology

## 2015-01-08 DIAGNOSIS — I429 Cardiomyopathy, unspecified: Secondary | ICD-10-CM

## 2015-01-08 DIAGNOSIS — S72001A Fracture of unspecified part of neck of right femur, initial encounter for closed fracture: Secondary | ICD-10-CM | POA: Diagnosis present

## 2015-01-08 HISTORY — PX: HIP ARTHROPLASTY: SHX981

## 2015-01-08 LAB — CBC
HEMATOCRIT: 31.2 % — AB (ref 36.0–46.0)
HEMOGLOBIN: 10.5 g/dL — AB (ref 12.0–15.0)
MCH: 34.1 pg — AB (ref 26.0–34.0)
MCHC: 33.7 g/dL (ref 30.0–36.0)
MCV: 101.3 fL — ABNORMAL HIGH (ref 78.0–100.0)
Platelets: 218 10*3/uL (ref 150–400)
RBC: 3.08 MIL/uL — AB (ref 3.87–5.11)
RDW: 17.6 % — ABNORMAL HIGH (ref 11.5–15.5)
WBC: 7.7 10*3/uL (ref 4.0–10.5)

## 2015-01-08 LAB — BASIC METABOLIC PANEL
Anion gap: 5 (ref 5–15)
BUN: 15 mg/dL (ref 6–20)
CHLORIDE: 103 mmol/L (ref 101–111)
CO2: 28 mmol/L (ref 22–32)
Calcium: 8.1 mg/dL — ABNORMAL LOW (ref 8.9–10.3)
Creatinine, Ser: 1.19 mg/dL — ABNORMAL HIGH (ref 0.44–1.00)
GFR calc non Af Amer: 42 mL/min — ABNORMAL LOW (ref >60)
GFR, EST AFRICAN AMERICAN: 49 mL/min — AB (ref >60)
Glucose, Bld: 160 mg/dL — ABNORMAL HIGH (ref 65–99)
POTASSIUM: 4.1 mmol/L (ref 3.5–5.1)
Sodium: 136 mmol/L (ref 135–145)

## 2015-01-08 LAB — GLUCOSE, CAPILLARY
GLUCOSE-CAPILLARY: 144 mg/dL — AB (ref 65–99)
GLUCOSE-CAPILLARY: 163 mg/dL — AB (ref 65–99)
GLUCOSE-CAPILLARY: 167 mg/dL — AB (ref 65–99)
GLUCOSE-CAPILLARY: 128 mg/dL — AB (ref 65–99)

## 2015-01-08 SURGERY — HEMIARTHROPLASTY, HIP, DIRECT ANTERIOR APPROACH, FOR FRACTURE
Anesthesia: General | Site: Hip | Laterality: Right

## 2015-01-08 MED ORDER — ISOPROPYL ALCOHOL 70 % SOLN
Status: DC | PRN
  Administered 2015-01-08: 1 via TOPICAL

## 2015-01-08 MED ORDER — LACTATED RINGERS IV SOLN
INTRAVENOUS | Status: DC
  Administered 2015-01-08 – 2015-01-09 (×2): via INTRAVENOUS

## 2015-01-08 MED ORDER — LACTATED RINGERS IV SOLN
INTRAVENOUS | Status: DC | PRN
  Administered 2015-01-08 (×2): via INTRAVENOUS

## 2015-01-08 MED ORDER — FENTANYL CITRATE (PF) 250 MCG/5ML IJ SOLN
INTRAMUSCULAR | Status: AC
  Filled 2015-01-08: qty 25

## 2015-01-08 MED ORDER — REGADENOSON 0.4 MG/5ML IV SOLN
0.4000 mg | Freq: Once | INTRAVENOUS | Status: AC
  Administered 2015-01-08: 0.4 mg via INTRAVENOUS

## 2015-01-08 MED ORDER — SODIUM CHLORIDE 0.9 % IJ SOLN
INTRAMUSCULAR | Status: DC | PRN
  Administered 2015-01-08: 30 mL

## 2015-01-08 MED ORDER — ASPIRIN EC 325 MG PO TBEC
325.0000 mg | DELAYED_RELEASE_TABLET | Freq: Two times a day (BID) | ORAL | Status: DC
  Administered 2015-01-09 – 2015-01-10 (×3): 325 mg via ORAL
  Filled 2015-01-08 (×4): qty 1

## 2015-01-08 MED ORDER — LACTATED RINGERS IV SOLN
INTRAVENOUS | Status: DC
  Administered 2015-01-08: 1000 mL via INTRAVENOUS

## 2015-01-08 MED ORDER — CEFAZOLIN SODIUM-DEXTROSE 2-3 GM-% IV SOLR
INTRAVENOUS | Status: DC | PRN
  Administered 2015-01-08: 2 g via INTRAVENOUS

## 2015-01-08 MED ORDER — MORPHINE SULFATE (PF) 4 MG/ML IV SOLN
INTRAVENOUS | Status: AC
  Filled 2015-01-08: qty 1

## 2015-01-08 MED ORDER — DEXAMETHASONE SODIUM PHOSPHATE 10 MG/ML IJ SOLN
INTRAMUSCULAR | Status: DC | PRN
  Administered 2015-01-08: 4 mg via INTRAVENOUS

## 2015-01-08 MED ORDER — HYDROCODONE-ACETAMINOPHEN 5-325 MG PO TABS
ORAL_TABLET | ORAL | Status: AC
  Filled 2015-01-08: qty 2

## 2015-01-08 MED ORDER — PHENYLEPHRINE HCL 10 MG/ML IJ SOLN
INTRAMUSCULAR | Status: DC | PRN
  Administered 2015-01-08 (×2): 80 ug via INTRAVENOUS

## 2015-01-08 MED ORDER — NEOSTIGMINE METHYLSULFATE 10 MG/10ML IV SOLN
INTRAVENOUS | Status: DC | PRN
  Administered 2015-01-08: 4 mg via INTRAVENOUS

## 2015-01-08 MED ORDER — TECHNETIUM TC 99M SESTAMIBI GENERIC - CARDIOLITE
10.0000 | Freq: Once | INTRAVENOUS | Status: AC | PRN
  Administered 2015-01-08: 10 via INTRAVENOUS

## 2015-01-08 MED ORDER — HYDROGEN PEROXIDE 3 % EX SOLN
CUTANEOUS | Status: DC | PRN
  Administered 2015-01-08: 1 via TOPICAL

## 2015-01-08 MED ORDER — HYDROMORPHONE HCL 1 MG/ML IJ SOLN: 0.2500 mg | INTRAMUSCULAR | Status: DC | PRN

## 2015-01-08 MED ORDER — PHENOL 1.4 % MT LIQD: 1.0000 | OROMUCOSAL | Status: DC | PRN

## 2015-01-08 MED ORDER — REGADENOSON 0.4 MG/5ML IV SOLN
INTRAVENOUS | Status: AC
  Filled 2015-01-08: qty 5

## 2015-01-08 MED ORDER — EPHEDRINE SULFATE 50 MG/ML IJ SOLN
INTRAMUSCULAR | Status: DC | PRN
  Administered 2015-01-08: 10 mg via INTRAVENOUS
  Administered 2015-01-08: 5 mg via INTRAVENOUS
  Administered 2015-01-08: 10 mg via INTRAVENOUS
  Administered 2015-01-08: 5 mg via INTRAVENOUS
  Administered 2015-01-08 (×2): 10 mg via INTRAVENOUS

## 2015-01-08 MED ORDER — MENTHOL 3 MG MT LOZG: 1.0000 | LOZENGE | OROMUCOSAL | Status: DC | PRN

## 2015-01-08 MED ORDER — ONDANSETRON HCL 4 MG PO TABS: 4.0000 mg | ORAL_TABLET | Freq: Four times a day (QID) | ORAL | Status: DC | PRN

## 2015-01-08 MED ORDER — ACETAMINOPHEN 650 MG RE SUPP: 650.0000 mg | Freq: Four times a day (QID) | RECTAL | Status: DC | PRN

## 2015-01-08 MED ORDER — CEPHALEXIN 500 MG PO CAPS
500.0000 mg | ORAL_CAPSULE | Freq: Two times a day (BID) | ORAL | Status: DC
  Administered 2015-01-09 – 2015-01-10 (×3): 500 mg via ORAL
  Filled 2015-01-08 (×4): qty 1

## 2015-01-08 MED ORDER — FENTANYL CITRATE (PF) 100 MCG/2ML IJ SOLN
INTRAMUSCULAR | Status: DC | PRN
  Administered 2015-01-08 (×2): 50 ug via INTRAVENOUS
  Administered 2015-01-08: 100 ug via INTRAVENOUS
  Administered 2015-01-08 (×2): 50 ug via INTRAVENOUS

## 2015-01-08 MED ORDER — SODIUM CHLORIDE 0.9 % IJ SOLN
INTRAMUSCULAR | Status: AC
  Filled 2015-01-08: qty 50

## 2015-01-08 MED ORDER — GLYCOPYRROLATE 0.2 MG/ML IJ SOLN
INTRAMUSCULAR | Status: DC | PRN
  Administered 2015-01-08: 0.6 mg via INTRAVENOUS

## 2015-01-08 MED ORDER — SODIUM CHLORIDE 0.9 % IV SOLN: 1000.0000 mL | INTRAVENOUS | Status: DC

## 2015-01-08 MED ORDER — SODIUM CHLORIDE 0.9 % IV SOLN
1000.0000 mg | INTRAVENOUS | Status: AC
  Administered 2015-01-08: 1000 mg via INTRAVENOUS
  Filled 2015-01-08: qty 10

## 2015-01-08 MED ORDER — HYDROGEN PEROXIDE 3 % EX SOLN
CUTANEOUS | Status: AC
  Filled 2015-01-08: qty 473

## 2015-01-08 MED ORDER — BUPIVACAINE-EPINEPHRINE (PF) 0.25% -1:200000 IJ SOLN
INTRAMUSCULAR | Status: AC
  Filled 2015-01-08: qty 30

## 2015-01-08 MED ORDER — BUPIVACAINE-EPINEPHRINE 0.25% -1:200000 IJ SOLN
INTRAMUSCULAR | Status: DC | PRN
  Administered 2015-01-08: 30 mL

## 2015-01-08 MED ORDER — METOCLOPRAMIDE HCL 10 MG PO TABS: 5.0000 mg | ORAL_TABLET | Freq: Three times a day (TID) | ORAL | Status: DC | PRN

## 2015-01-08 MED ORDER — PROPOFOL 10 MG/ML IV BOLUS
INTRAVENOUS | Status: DC | PRN
  Administered 2015-01-08: 120 mg via INTRAVENOUS

## 2015-01-08 MED ORDER — TECHNETIUM TC 99M SESTAMIBI - CARDIOLITE
30.0000 | Freq: Once | INTRAVENOUS | Status: AC | PRN
  Administered 2015-01-08: 30 via INTRAVENOUS

## 2015-01-08 MED ORDER — ACETAMINOPHEN 325 MG PO TABS
650.0000 mg | ORAL_TABLET | Freq: Four times a day (QID) | ORAL | Status: DC | PRN
Start: 2015-01-08 — End: 2015-01-10

## 2015-01-08 MED ORDER — KETOROLAC TROMETHAMINE 30 MG/ML IJ SOLN
INTRAMUSCULAR | Status: AC
  Filled 2015-01-08: qty 1

## 2015-01-08 MED ORDER — HYDROCODONE-ACETAMINOPHEN 5-325 MG PO TABS
2.0000 | ORAL_TABLET | Freq: Once | ORAL | Status: AC
  Administered 2015-01-08: 2 via ORAL

## 2015-01-08 MED ORDER — CEFAZOLIN SODIUM-DEXTROSE 2-3 GM-% IV SOLR
2.0000 g | Freq: Four times a day (QID) | INTRAVENOUS | Status: AC
  Administered 2015-01-08 – 2015-01-09 (×2): 2 g via INTRAVENOUS
  Filled 2015-01-08 (×2): qty 50

## 2015-01-08 MED ORDER — ONDANSETRON HCL 4 MG/2ML IJ SOLN
INTRAMUSCULAR | Status: DC | PRN
  Administered 2015-01-08: 4 mg via INTRAVENOUS

## 2015-01-08 MED ORDER — ROCURONIUM BROMIDE 100 MG/10ML IV SOLN
INTRAVENOUS | Status: DC | PRN
  Administered 2015-01-08: 30 mg via INTRAVENOUS

## 2015-01-08 MED ORDER — SODIUM CHLORIDE 0.9 % IJ SOLN
INTRAMUSCULAR | Status: AC
  Filled 2015-01-08: qty 10

## 2015-01-08 MED ORDER — ONDANSETRON HCL 4 MG/2ML IJ SOLN
4.0000 mg | Freq: Four times a day (QID) | INTRAMUSCULAR | Status: DC | PRN
  Administered 2015-01-10: 4 mg via INTRAVENOUS
  Filled 2015-01-08: qty 2

## 2015-01-08 MED ORDER — SUCCINYLCHOLINE CHLORIDE 20 MG/ML IJ SOLN
INTRAMUSCULAR | Status: DC | PRN
  Administered 2015-01-08: 100 mg via INTRAVENOUS

## 2015-01-08 MED ORDER — EPHEDRINE SULFATE 50 MG/ML IJ SOLN
INTRAMUSCULAR | Status: AC
  Filled 2015-01-08: qty 1

## 2015-01-08 MED ORDER — HYDROCODONE-ACETAMINOPHEN 5-325 MG PO TABS
1.0000 | ORAL_TABLET | Freq: Four times a day (QID) | ORAL | Status: DC | PRN
  Administered 2015-01-09: 2 via ORAL
  Filled 2015-01-08: qty 2

## 2015-01-08 MED ORDER — CEFAZOLIN SODIUM-DEXTROSE 2-3 GM-% IV SOLR
INTRAVENOUS | Status: AC
  Filled 2015-01-08: qty 50

## 2015-01-08 MED ORDER — PROPOFOL 10 MG/ML IV BOLUS
INTRAVENOUS | Status: AC
  Filled 2015-01-08: qty 20

## 2015-01-08 MED ORDER — KETOROLAC TROMETHAMINE 30 MG/ML IJ SOLN
INTRAMUSCULAR | Status: DC | PRN
  Administered 2015-01-08: 30 mg

## 2015-01-08 MED ORDER — METOCLOPRAMIDE HCL 5 MG/ML IJ SOLN: 5.0000 mg | Freq: Three times a day (TID) | INTRAMUSCULAR | Status: DC | PRN

## 2015-01-08 SURGICAL SUPPLY — 46 items
BAG ZIPLOCK 12X15 (MISCELLANEOUS) IMPLANT
CAPT HIP HEMI 2 ×3 IMPLANT
CHLORAPREP W/TINT 26ML (MISCELLANEOUS) ×3 IMPLANT
COVER PERINEAL POST (MISCELLANEOUS) ×3 IMPLANT
DECANTER SPIKE VIAL GLASS SM (MISCELLANEOUS) ×3 IMPLANT
DRAPE C-ARM 42X120 X-RAY (DRAPES) ×3 IMPLANT
DRAPE STERI IOBAN 125X83 (DRAPES) ×3 IMPLANT
DRAPE U-SHAPE 47X51 STRL (DRAPES) ×9 IMPLANT
DRSG AQUACEL AG ADV 3.5X10 (GAUZE/BANDAGES/DRESSINGS) ×3 IMPLANT
DRSG TEGADERM 4X4.75 (GAUZE/BANDAGES/DRESSINGS) IMPLANT
ELECT BLADE TIP CTD 4 INCH (ELECTRODE) ×6 IMPLANT
ELECT PENCIL ROCKER SW 15FT (MISCELLANEOUS) IMPLANT
ELECT REM PT RETURN 15FT ADLT (MISCELLANEOUS) ×3 IMPLANT
ELECT REM PT RETURN 9FT ADLT (ELECTROSURGICAL) ×3
ELECTRODE REM PT RTRN 9FT ADLT (ELECTROSURGICAL) ×1 IMPLANT
EVACUATOR 1/8 PVC DRAIN (DRAIN) IMPLANT
FACESHIELD WRAPAROUND (MASK) ×3 IMPLANT
GAUZE SPONGE 2X2 8PLY STRL LF (GAUZE/BANDAGES/DRESSINGS) IMPLANT
GAUZE SPONGE 4X4 12PLY STRL (GAUZE/BANDAGES/DRESSINGS) ×3 IMPLANT
GLOVE BIO SURGEON STRL SZ8.5 (GLOVE) ×6 IMPLANT
GLOVE BIOGEL PI IND STRL 8.5 (GLOVE) ×1 IMPLANT
GLOVE BIOGEL PI INDICATOR 8.5 (GLOVE) ×2
GOWN SPEC L3 XXLG W/TWL (GOWN DISPOSABLE) ×3 IMPLANT
HANDPIECE INTERPULSE COAX TIP (DISPOSABLE) ×2
HOOD PEEL AWAY FACE SHEILD DIS (HOOD) ×6 IMPLANT
KIT BASIN OR (CUSTOM PROCEDURE TRAY) ×3 IMPLANT
LIQUID BAND (GAUZE/BANDAGES/DRESSINGS) ×3 IMPLANT
MANIFOLD NEPTUNE II (INSTRUMENTS) ×3 IMPLANT
NEEDLE SPNL 18GX3.5 QUINCKE PK (NEEDLE) ×3 IMPLANT
PACK TOTAL JOINT (CUSTOM PROCEDURE TRAY) ×3 IMPLANT
PEN SKIN MARKING BROAD (MISCELLANEOUS) ×3 IMPLANT
SAW OSC TIP CART 19.5X105X1.3 (SAW) ×3 IMPLANT
SEALER BIPOLAR AQUA 6.0 (INSTRUMENTS) ×3 IMPLANT
SET HNDPC FAN SPRY TIP SCT (DISPOSABLE) ×1 IMPLANT
SOL PREP POV-IOD 4OZ 10% (MISCELLANEOUS) ×3 IMPLANT
SPONGE GAUZE 2X2 STER 10/PKG (GAUZE/BANDAGES/DRESSINGS)
SUT ETHIBOND NAB CT1 #1 30IN (SUTURE) ×6 IMPLANT
SUT MNCRL AB 3-0 PS2 18 (SUTURE) ×3 IMPLANT
SUT MON AB 2-0 CT1 36 (SUTURE) ×6 IMPLANT
SUT VIC AB 2-0 CT1 27 (SUTURE) ×2
SUT VIC AB 2-0 CT1 TAPERPNT 27 (SUTURE) ×1 IMPLANT
SUT VLOC 180 0 24IN GS25 (SUTURE) ×3 IMPLANT
SYR 50ML LL SCALE MARK (SYRINGE) ×3 IMPLANT
TOWEL OR 17X26 10 PK STRL BLUE (TOWEL DISPOSABLE) ×3 IMPLANT
TOWEL OR NON WOVEN STRL DISP B (DISPOSABLE) ×3 IMPLANT
YANKAUER SUCT BULB TIP 10FT TU (MISCELLANEOUS) ×3 IMPLANT

## 2015-01-08 NOTE — Progress Notes (Signed)
Lexiscan MV performed.  Morphine helping pain, but it is not well-controlled. Will give 2 Vicodin to help pt get through the stress testing today.  Rosaria Ferries, PA-C 01/08/2015 10:14 AM Beeper [phone]

## 2015-01-08 NOTE — Anesthesia Preprocedure Evaluation (Addendum)
Anesthesia Evaluation  Patient identified by MRN, date of birth, ID band Patient awake    Reviewed: Allergy & Precautions, H&P , NPO status , Patient's Chart, lab work & pertinent test results, reviewed documented beta blocker date and time   Airway Mallampati: II  TM Distance: >3 FB Neck ROM: full    Dental  (+) Dental Advisory Given, Caps All front teeth are capped:   Pulmonary neg pulmonary ROS, Current Smoker,    Pulmonary exam normal breath sounds clear to auscultation       Cardiovascular Exercise Tolerance: Good hypertension, Pt. on home beta blockers and Pt. on medications Normal cardiovascular exam Rhythm:regular Rate:Normal  Cardiomyopathy. Recent echo EF 35%. Prolonged QT   Neuro/Psych History epidural abscess 8/15 negative neurological ROS  negative psych ROS   GI/Hepatic negative GI ROS, (+) Cirrhosis   ascites    ,   Endo/Other  diabetes, Well Controlled, Type 2, Insulin Dependent  Renal/GU Renal diseaseDiabetic nephropathy  negative genitourinary   Musculoskeletal   Abdominal   Peds  Hematology negative hematology ROS (+) anemia ,   Anesthesia Other Findings   Reproductive/Obstetrics negative OB ROS                           Anesthesia Physical Anesthesia Plan  ASA: III  Anesthesia Plan: General   Post-op Pain Management:    Induction: Intravenous  Airway Management Planned: Oral ETT  Additional Equipment:   Intra-op Plan:   Post-operative Plan: Extubation in OR  Informed Consent: I have reviewed the patients History and Physical, chart, labs and discussed the procedure including the risks, benefits and alternatives for the proposed anesthesia with the patient or authorized representative who has indicated his/her understanding and acceptance.   Dental Advisory Given  Plan Discussed with: CRNA and Surgeon  Anesthesia Plan Comments:         Anesthesia  Quick Evaluation

## 2015-01-08 NOTE — Progress Notes (Signed)
PT Cancellation Note  Patient Details Name: Tamara Stephenson MRN: [id number] DOB: [birth year]   Cancelled Treatment:    Reason Eval/Treat Not Completed: Medical issues which prohibited therapy Pending surgery for hip fx.  Please re-order after surgery.   Parissa Chiao,[name] 01/08/2015, 8:18 AM [name], PT, DPT 01/08/2015 Pager: [phone]

## 2015-01-08 NOTE — NC FL2 (Deleted)
[facility] MEDICAID FL2 LEVEL OF CARE SCREENING TOOL     IDENTIFICATION  Patient Name: Tamara Stephenson Birthdate: Apr 22, 1933 Sex: female Admission Date (Current Location): 01/06/2015  [facility] and [place] Number: [place] and Address:  [facility],  [place] [address] [place]      Provider Number: [id number]  Attending Physician Name and Address:  Theodis Blaze, MD  Relative Name and Phone Number:       Current Level of Care: Hospital Recommended Level of Care: [place] Prior Approval Number:    Date Approved/Denied:   PASRR Number:   [id number]   Discharge Plan: SNF    Current Diagnoses: Patient Active Problem List   Diagnosis Date Noted  . Preoperative cardiovascular examination 01/07/2015  . Cardiomyopathy ([place]) 01/07/2015  . Hip fracture ([place]) 01/06/2015  . Cirrhosis of liver with ascites ([place]) 12/26/2014  . CKD (chronic kidney disease) stage 3, GFR 30-59 ml/min 09/16/2014  . Anemia of chronic disease 09/16/2014  . Diabetes mellitus with renal manifestations, uncontrolled ([place]) 09/16/2014  . Obesity, Class I, BMI 30-34.9 09/16/2014  . Diabetic nephropathy ([place]) 09/16/2014    Orientation ACTIVITIES/SOCIAL BLADDER RESPIRATION    Self, Time, Situation, Place  Active Continent Normal  BEHAVIORAL SYMPTOMS/MOOD NEUROLOGICAL BOWEL NUTRITION STATUS      Continent    PHYSICIAN VISITS COMMUNICATION OF NEEDS Height & Weight Skin    Verbally 5\' 6"  (167.6 cm) 188 lbs. Normal          AMBULATORY STATUS RESPIRATION    Assist extensive Normal      Personal Care Assistance Level of Assistance  Bathing, Dressing Bathing Assistance: Limited assistance   Dressing Assistance: Limited assistance      Functional Limitations Info                SPECIAL CARE FACTORS FREQUENCY  PT (By licensed PT), OT (By licensed OT)     PT Frequency: 5 OT Frequency: 5           Additional Factors Info  Code Status Code  Status Info: dnr             Current Medications (01/08/2015): Current Facility-Administered Medications  Medication Dose Route Frequency Provider Last Rate Last Dose  . aspirin EC tablet 162 mg  162 mg Oral QHS Belkys A Regalado, MD   162 mg at 01/07/15 2156  . buPROPion (WELLBUTRIN XL) 24 hr tablet 300 mg  300 mg Oral Daily Belkys A Regalado, MD   300 mg at 01/08/15 1140  . carvedilol (COREG) tablet 6.25 mg  6.25 mg Oral BID WC Belkys A Regalado, MD   6.25 mg at 01/08/15 1141  . cephALEXin (KEFLEX) capsule 500 mg  500 mg Oral BID Belkys A Regalado, MD   500 mg at 01/08/15 1141  . docusate sodium (COLACE) capsule 100 mg  100 mg Oral BID Belkys A Regalado, MD   100 mg at 01/08/15 1141  . furosemide (LASIX) tablet 40 mg  40 mg Oral Daily Belkys A Regalado, MD   40 mg at 01/08/15 1141  . gabapentin (NEURONTIN) capsule 300 mg  300 mg Oral QHS Belkys A Regalado, MD   300 mg at 01/07/15 2156  . hydrALAZINE (APRESOLINE) injection 10 mg  10 mg Intravenous Q4H PRN Theodis Blaze, MD      . HYDROcodone-acetaminophen (NORCO/VICODIN) 5-325 MG per tablet 1-2 tablet  1-2 tablet Oral Q6H PRN Elmarie Shiley, MD      .  insulin aspart (novoLOG) injection 0-9 Units  0-9 Units Subcutaneous TID WC Belkys A Regalado, MD   2 Units at 01/08/15 1258  . lactated ringers infusion   Intravenous Continuous Rod Mae, MD 125 mL/hr at 01/08/15 1340 1,000 mL at 01/08/15 1340  . lactulose (CHRONULAC) 10 GM/15ML solution 10 g  10 g Oral BID Belkys A Regalado, MD   10 g at 01/08/15 1141  . morphine 2 MG/ML injection 0.5 mg  0.5 mg Intravenous Q2H PRN Belkys A Regalado, MD   0.5 mg at 01/08/15 1026  . potassium chloride SA (K-DUR,KLOR-CON) CR tablet 40 mEq  40 mEq Oral Daily Belkys A Regalado, MD   40 mEq at 01/08/15 1140  . tranexamic acid (CYKLOKAPRON) 1,000 mg in sodium chloride 0.9 % 100 mL IVPB  1,000 mg Intravenous To OR Rod Can, MD      . ursodiol (ACTIGALL) capsule 300 mg  300 mg Oral TID Belkys A  Regalado, MD   300 mg at 01/08/15 1141   Facility-Administered Medications Ordered in Other Encounters  Medication Dose Route Frequency Provider Last Rate Last Dose  . ceFAZolin (ANCEF) IVPB 2 g/50 mL premix   Intravenous Anesthesia Intra-op Gean Maidens, CRNA   2 g at 01/08/15 1638  . dexamethasone (DECADRON) injection    Anesthesia Intra-op Gean Maidens, CRNA   4 mg at 01/08/15 1659  . ePHEDrine injection    Anesthesia Intra-op Gean Maidens, CRNA   5 mg at 01/08/15 1705  . fentaNYL (SUBLIMAZE) injection    Anesthesia Intra-op Gean Maidens, CRNA   100 mcg at 01/08/15 1713  . HYDROcodone-acetaminophen (NORCO/VICODIN) 5-325 MG per tablet           . lactated ringers infusion    Continuous PRN Gean Maidens, CRNA      . morphine 4 MG/ML injection           . phenylephrine (NEO-SYNEPHRINE) injection   Intravenous Anesthesia Intra-op Gean Maidens, CRNA   80 mcg at 01/08/15 1653  . regadenoson (LEXISCAN) 0.4 MG/5ML injection SOLN           . rocuronium (ZEMURON) injection    Anesthesia Intra-op Gean Maidens, CRNA   30 mg at 01/08/15 1643   Do not use this list as official medication orders. Please verify with discharge summary.  Discharge Medications:   Medication List    ASK your doctor about these medications        acetaminophen 500 MG tablet  Commonly known as:  TYLENOL  Take 500 mg by mouth every 6 (six) hours as needed for moderate pain.     acidophilus Caps capsule  Take 1 capsule by mouth daily.     aspirin EC 81 MG EC tablet  Generic drug:  aspirin  Take 162 mg by mouth at bedtime. Swallow whole.     buPROPion 300 MG 24 hr tablet  Commonly known as:  WELLBUTRIN XL  Take 300 mg by mouth daily.     carvedilol 6.25 MG tablet  Commonly known as:  COREG  Take 1 tablet (6.25 mg total) by mouth 2 (two) times daily with a meal.     cephALEXin 500 MG capsule  Commonly known as:  KEFLEX  Take 1 capsule (500 mg total) by mouth 2 (two) times daily.      cholecalciferol 1000 UNITS tablet  Commonly known as:  VITAMIN D  Take 2,000 Units by mouth daily.     furosemide 40  MG tablet  Commonly known as:  LASIX  Take 1 tablet (40 mg total) by mouth daily.     gabapentin 300 MG capsule  Commonly known as:  NEURONTIN  Take 1 capsule (300 mg total) by mouth at bedtime.     insulin aspart 100 UNIT/ML injection  Commonly known as:  novoLOG  Inject 3 Units into the skin 3 (three) times daily with meals.     insulin detemir 100 UNIT/ML injection  Commonly known as:  LEVEMIR  Inject 0.08 mLs (8 Units total) into the skin at bedtime.     lactulose 10 GM/15ML solution  Commonly known as:  CHRONULAC  Take 15 mLs (10 g total) by mouth 2 (two) times daily.     lovastatin 20 MG tablet  Commonly known as:  MEVACOR  Take 20 mg by mouth at bedtime.     potassium chloride SA 20 MEQ tablet  Commonly known as:  K-DUR,KLOR-CON  Take 2 tablets (40 mEq total) by mouth daily.     ranitidine 300 MG tablet  Commonly known as:  ZANTAC  Take 300 mg by mouth 3 times/day as needed-between meals & bedtime for heartburn.     ursodiol 300 MG capsule  Commonly known as:  ACTIGALL  Take 300 mg by mouth 3 (three) times daily.     Vitamin B 12 100 MCG Lozg  Take 1 tablet by mouth every other day.        Relevant Imaging Results:  Relevant Lab Results:  Recent Labs    Additional Information    Ludwig Clarks, LCSW

## 2015-01-08 NOTE — Progress Notes (Signed)
Called patient nurse [facility]. Aware she was given 2 Vicodin and 0.5mg  Morphine IV. Discussed with nurse that Morphine dose 0.5mg  could maybe be increased.

## 2015-01-08 NOTE — Anesthesia Postprocedure Evaluation (Signed)
  Anesthesia Post-op Note  Patient: Tamara Stephenson  Procedure(s) Performed: Procedure(s) (LRB): RIGHT ANTERIOR APPROACH HEMI HIP ARTHROPLASTY (Right)  Patient Location: PACU  Anesthesia Type: General  Level of Consciousness: awake and alert   Airway and Oxygen Therapy: Patient Spontanous Breathing  Post-op Pain: mild  Post-op Assessment: Post-op Vital signs reviewed, Patient's Cardiovascular Status Stable, Respiratory Function Stable, Patent Airway and No signs of Nausea or vomiting  Last Vitals:  Filed Vitals:   01/08/15 1900  BP: 149/65  Pulse: 71  Temp:   Resp: 13    Post-op Vital Signs: stable   Complications: No apparent anesthesia complications

## 2015-01-08 NOTE — Transfer of Care (Signed)
Immediate Anesthesia Transfer of Care Note  Patient: Tamara Stephenson  Procedure(s) Performed: Procedure(s): RIGHT ANTERIOR APPROACH HEMI HIP ARTHROPLASTY (Right)  Patient Location: PACU  Anesthesia Type:General  Level of Consciousness: sedated, patient cooperative and responds to stimulation  Airway & Oxygen Therapy: Patient Spontanous Breathing and Patient connected to face mask oxygen  Post-op Assessment: Report given to RN and Post -op Vital signs reviewed and stable  Post vital signs: Reviewed and stable  Last Vitals:  Filed Vitals:   01/08/15 1308  BP: 138/60  Pulse: 64  Temp: 36.9 C  Resp: 19    Complications: No apparent anesthesia complications

## 2015-01-08 NOTE — Op Note (Signed)
OPERATIVE REPORT  SURGEON: Rod Can, MD   ASSISTANT: Nehemiah Massed, PA-C.  PREOPERATIVE DIAGNOSIS: Displaced Right femoral neck fracture.   POSTOPERATIVE DIAGNOSIS: Displaced Right femoral neck fracture.   PROCEDURE: Right hip hemiarthroplasty, anterior approach.   IMPLANTS: DePuy Tri Lock stem, size 6, hi offset, with a -3 mm spacer and a 48 mm monopolar head ball.  ANESTHESIA:  General  ANTIBIOTICS: 2g ancef.  ESTIMATED BLOOD LOSS: 350 mL.  DRAINS: None.  COMPLICATIONS: None   CONDITION: PACU - hemodynamically stable.   BRIEF CLINICAL NOTE: Tamara Stephenson is a 81 y.o. female with a displaced Right femoral neck fracture. The patient was admitted to the hospitalist service and had a cardiology evaluation. She underwent perioperative risk stratification and medical optimization. The risks, benefits, and alternatives to hemiarthroplasty were explained, and the patient elected to proceed.  PROCEDURE IN DETAIL: The patient was taken to the operating room and general anesthesia was induced on the hospital bed. The patient was then positioned on the [place] table. All bony prominences were well padded. The hip was prepped and draped in the normal sterile surgical fashion. A time-out was called verifying side and site of surgery. Antibiotics were given within 60 minutes of beginning the procedure.  The direct anterior approach to the hip was performed through the Hueter interval. Lateral femoral circumflex vessels were treated with the Auqumantys. The anterior capsule was exposed and an inverted T capsulotomy was made. Fracture hematoma was encountered and evacuated. The patient was found to have a comminuted Right subcapital femoral neck fracture. I freshened the femoral neck cut with a saw. I removed the femoral neck fragment. A corkscrew was placed into the head and the head was removed. This was passed to the back table and was measured.  Acetabular exposure was  achieved. I examined the articular cartilage which was intact. The labrum was intact. A 48 mm trial head was placed and found to have excellent fit.  I then gained femoral exposure taking care to protect the abductors and greater trochanter. This was performed using standard external rotation, extension, and adduction. The capsule was peeled off the inner aspect of the greater trochanter, taking care to preserve the short external rotators. A cookie cutter was used to enter the femoral canal, and then the femoral canal finder was used to confirm location. I then sequentially broached up to a size 6. Calcar planer was used on the femoral neck remnant. I paced a std neck and a 36+ 0 head ball.The hip was reduced. Leg lengths were checked fluoroscopically. The hip was dislocated and trial components were removed. I placed the real stem followed by the real spacer and head ball. A single reduction maneuver was performed and the hip was reduced. Fluoroscopy was used to confirm component position and leg lengths. At 90 degrees of external rotation and extension, the hip was stable to an anterior directed force.  The wound was copiously irrigated with normal saline solution. Marcaine solution was injected into the periarticular soft tissue. The wound was closed in layers using #1 Vicryl and V-Loc for the fascia, 2-0 Vicryl for the subcutaneous fat, 2-0 Monocryl for the deep dermal layer, 3-0 running Monocryl subcuticular stitch and glue for the skin. Once the glue was fully dried, an Aquacell Ag dressing was applied. The patient was then awakened from anesthesia and transported to the recovery room in stable condition. Sponge, needle, and instrument counts were correct at the end of the case x2. The patient tolerated the procedure well  and there were no known complications.  Please note that a surgical assistant was a medical necessity for this procedure to perform it in a safe and expeditious  manner. Assistant was necessary to provide appropriate retraction of vital neurovascular structures, to prevent femoral fracture, and to allow for anatomic placement of the prosthesis.

## 2015-01-08 NOTE — Discharge Instructions (Signed)
°Dr. Amauri Medellin °Joint Replacement Specialist °[facility] °[address] °[place], [place] [address] °[phone] ° ° °TOTAL HIP REPLACEMENT POSTOPERATIVE DIRECTIONS ° ° ° °Hip Rehabilitation, Guidelines Following Surgery  ° °WEIGHT BEARING °Weight bearing as tolerated with assist device (walker, cane, etc) as directed, use it as long as suggested by your surgeon or therapist, typically at least 4-6 weeks. ° °The results of a hip operation are greatly improved after range of motion and muscle strengthening exercises. Follow all safety measures which are given to protect your hip. If any of these exercises cause increased pain or swelling in your joint, decrease the amount until you are comfortable again. Then slowly increase the exercises. Call your caregiver if you have problems or questions.  ° °HOME CARE INSTRUCTIONS  °Most of the following instructions are designed to prevent the dislocation of your new hip.  °Remove items at home which could result in a fall. This includes throw rugs or furniture in walking pathways.  °Continue medications as instructed at time of discharge. °· You may have some home medications which will be placed on hold until you complete the course of blood thinner medication. °· You may start showering once you are discharged home. Do not remove your dressing. °Do not put on socks or shoes without following the instructions of your caregivers.   °Sit on chairs with arms. Use the chair arms to help push yourself up when arising.  °Arrange for the use of a toilet seat elevator so you are not sitting low.  °· Walk with walker as instructed.  °You may resume a sexual relationship in one month or when given the OK by your caregiver.  °Use walker as long as suggested by your caregivers.  °You may put full weight on your legs and walk as much as is comfortable. °Avoid periods of inactivity such as sitting longer than an hour when not asleep. This helps prevent  blood clots.  °You may return to work once you are cleared by your surgeon.  °Do not drive a car for 6 weeks or until released by your surgeon.  °Do not drive while taking narcotics.  °Wear elastic stockings for two weeks following surgery during the day but you may remove then at night.  °Make sure you keep all of your appointments after your operation with all of your doctors and caregivers. You should call the office at the above phone number and make an appointment for approximately two weeks after the date of your surgery. °Please pick up a stool softener and laxative for home use as long as you are requiring pain medications. °· ICE to the affected hip every three hours for 30 minutes at a time and then as needed for pain and swelling. Continue to use ice on the hip for pain and swelling from surgery. You may notice swelling that will progress down to the foot and ankle.  This is normal after surgery.  Elevate the leg when you are not up walking on it.   °It is important for you to complete the blood thinner medication as prescribed by your doctor. °· Continue to use the breathing machine which will help keep your temperature down.  It is common for your temperature to cycle up and down following surgery, especially at night when you are not up moving around and exerting yourself.  The breathing machine keeps your lungs expanded and your temperature down. ° °RANGE OF MOTION AND STRENGTHENING EXERCISES  °These exercises are   designed to help you keep full movement of your hip joint. Follow your caregiver's or physical therapist's instructions. Perform all exercises about fifteen times, three times per day or as directed. Exercise both hips, even if you have had only one joint replacement. These exercises can be done on a training (exercise) mat, on the floor, on a table or on a bed. Use whatever works the best and is most comfortable for you. Use music or television while you are exercising so that the exercises  are a pleasant break in your day. This will make your life better with the exercises acting as a break in routine you can look forward to.  °Lying on your back, slowly slide your foot toward your buttocks, raising your knee up off the floor. Then slowly slide your foot back down until your leg is straight again.  °Lying on your back spread your legs as far apart as you can without causing discomfort.  °Lying on your side, raise your upper leg and foot straight up from the floor as far as is comfortable. Slowly lower the leg and repeat.  °Lying on your back, tighten up the muscle in the front of your thigh (quadriceps muscles). You can do this by keeping your leg straight and trying to raise your heel off the floor. This helps strengthen the largest muscle supporting your knee.  °Lying on your back, tighten up the muscles of your buttocks both with the legs straight and with the knee bent at a comfortable angle while keeping your heel on the floor.  ° °SKILLED REHAB INSTRUCTIONS: °If the patient is transferred to a skilled rehab facility following release from the hospital, a list of the current medications will be sent to the facility for the patient to continue.  When discharged from the skilled rehab facility, please have the facility set up the patient's Home Health Physical Therapy prior to being released. Also, the skilled facility will be responsible for providing the patient with their medications at time of release from the facility to include their pain medication and their blood thinner medication. If the patient is still at the rehab facility at time of the two week follow up appointment, the skilled rehab facility will also need to assist the patient in arranging follow up appointment in our office and any transportation needs. ° °MAKE SURE YOU:  °Understand these instructions.  °Will watch your condition.  °Will get help right away if you are not doing well or get worse. ° °Pick up stool softner and  laxative for home use following surgery while on pain medications. °Do not remove your dressing. °The dressing is waterproof--it is OK to take showers. °Continue to use ice for pain and swelling after surgery. °Do not use any lotions or creams on the incision until instructed by your surgeon. °Total Hip Protocol. ° ° °

## 2015-01-08 NOTE — Anesthesia Procedure Notes (Addendum)
Procedure Name: Intubation Performed by: Zyanna Leisinger J Pre-anesthesia Checklist: Patient identified, Emergency Drugs available, Suction available, Patient being monitored and Timeout performed Patient Re-evaluated:Patient Re-evaluated prior to inductionOxygen Delivery Method: Circle system utilized Preoxygenation: Pre-oxygenation with 100% oxygen Intubation Type: IV induction Ventilation: Mask ventilation without difficulty Laryngoscope Size: Mac and 3 Grade View: Grade I Tube type: Oral Tube size: 7.0 mm Number of attempts: 1 Placement Confirmation: ETT inserted through vocal cords under direct vision,  positive ETCO2,  CO2 detector and breath sounds checked- equal and bilateral Secured at: 21 cm Tube secured with: Tape Dental Injury: Teeth and Oropharynx as per pre-operative assessment      

## 2015-01-08 NOTE — Progress Notes (Signed)
Patient ID: Tamara Stephenson, female   DOB: 26-May-1933, 81 y.o.   MRN: [id number]  [facility] PROGRESS NOTE  Tamara Stephenson WIO:[id number] DOB: 1933-08-22 DOA: 01/06/2015 PCP: Curly Rim, MD   Brief narrative:    81 y.o. female with HTN, Liver disease, cirrhosis by CT scan, Diabetes, Systolic HF ef 35 -40 % by ECHO 12-2014, last admission to hospital for syncope secondary to hypoglycemia, presented to [facility] ED after an episode of fall at home while she was trying to get up from bed to wheelchair. She has denies chest pain or shortness of breath, no abd or urinary  concerns.   In ED, pt noted to be hemodynamically stable, VS notable for SBP in 180's otherwise stable, blood worn notable for Cr 1.17, Hg 10. Imaging studies notable for right femoral neck fracture. Ortho team consulted as well as cardiology team to provide clearance.   Assessment/Plan:    Principal Problem:   Hip fracture ([place]), right femoral neck, initial encounter  - after what appears to be mechanical fall - plan to take to OR this afternoon if cardiology clears   Active Problems:   Preoperative cardiovascular examination - plan for stress test this am prior to surgery  - appreciate cardiology following    Cardiomyopathy [facility]), chronic systolic CHF - management per cardiology  - 2D echo showed reduced EF of 35-40% (55-60% in 2015) - pt on Lasix, stop IVF - daily weights, I/O - wright trend since admission: 85 kg --> 80 this AM    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, uncontrolled ([place]) - continue with SSI for now - CBG's stable    CKD (chronic kidney disease) stage 3, GFR 30-59 ml/min - Cr is at baseline - BMP in AM    Anemia of chronic disease, CKD - Hg at baseline, no signs of bleeding - CBC in AM    Obesity, Class I, BMI 30-34.9    Diabetic nephropathy ([place]) - continue Gabapentin     Cirrhosis of liver with ascites ([place]) - outpatient follow up  DVT prophylaxis - aspirin 163  mg QD  Code Status: DNR Family Communication:  plan of care discussed with the patient Disposition Plan: To be determined   IV access:  Peripheral IV  Procedures and diagnostic studies:    Chest Portable 1 View 01/06/2015  No active cardiopulmonary disease. Mild increased pulmonary markings identified bilaterally, probably chronic.   Dg Hip Unilat With Pelvis 2-3 Views Right 01/06/2015 Right femoral neck fracture with varus angulation.   Medical Consultants:  Ortho  Cardiology for medical clearance   Other Consultants:  PT  IAnti-Infectives:   Keflex for recent UTI  Faye Ramsay, MD  [facility] Pager [phone]  If 7PM-7AM, please contact night-coverage [email or web address] Password [email or web address] 01/08/2015, 6:45 AM   LOS: 2 days   HPI/Subjective: No events overnight.   Objective: Filed Vitals:   01/07/15 1335 01/07/15 2118 01/08/15 0353 01/08/15 0551  BP: 176/81 159/58  168/62  Pulse: 66 70  69  Temp: 98.6 F (37 C) 98.7 F (37.1 C)  98.4 F (36.9 C)  TempSrc: Oral Oral  Oral  Resp: 17 18  20   Height:      Weight:   81.2 kg (179 lb 0.2 oz) 80.4 kg (177 lb 4 oz)  SpO2: 95% 90%  95%    Intake/Output Summary (Last 24 hours) at 01/08/15 0645 Last data filed at 01/08/15 0352  Gross per 24 hour  Intake 1723.33 ml  Output   5950 ml  Net -4226.67 ml    Exam:   General:  Pt is alert, follows commands appropriately, not in acute distress  Cardiovascular: Regular rate and rhythm, no rubs, no gallops  Respiratory: Clear to auscultation bilaterally, no wheezing, no crackles, no rhonchi  Abdomen: Soft, non tender, non distended, bowel sounds present, no guarding   Data Reviewed: Basic Metabolic Panel:  Recent Labs Lab 01/06/15 1212 01/06/15 1605 01/07/15 0235  NA 137  --  137  K 3.9  --  3.9  CL 106  --  107  CO2 26  --  28  GLUCOSE 159*  --  86  BUN 17  --  16  CREATININE 1.28*  --  1.17*  CALCIUM 8.5*  --  7.9*  MG  --  1.8  --    Liver Function  Tests:  Recent Labs Lab 01/06/15 1605  AST 41  ALT 20  ALKPHOS 187*  BILITOT 1.6*  PROT 8.1  ALBUMIN 2.7*   CBC:  Recent Labs Lab 01/06/15 1212 01/07/15 0235  WBC 7.0 6.6  NEUTROABS 4.9  --   HGB 10.7* 10.0*  HCT 32.4* 31.2*  MCV 101.6* 101.3*  PLT 213 205   Cardiac Enzymes:  Recent Labs Lab 01/06/15 1605 01/06/15 2115 01/07/15 0235  TROPONINI <0.03 <0.03 <0.03   BNP: Invalid input(s): POCBNP CBG:  Recent Labs Lab 01/06/15 2150 01/07/15 0729 01/07/15 1137 01/07/15 1647 01/07/15 2140  GLUCAP 119* 50* 99 202* 227*    Recent Results (from the past 240 hour(s))  Surgical pcr screen     Status: None   Collection Time: 01/07/15  2:47 AM  Result Value Ref Range Status   MRSA, PCR NEGATIVE NEGATIVE Final   Staphylococcus aureus NEGATIVE NEGATIVE Final    Scheduled Meds: . aspirin EC  162 mg Oral QHS  . buPROPion  300 mg Oral Daily  . carvedilol  6.25 mg Oral BID WC  . cephALEXin  500 mg Oral BID  . docusate sodium  100 mg Oral BID  . furosemide  40 mg Oral Daily  . gabapentin  300 mg Oral QHS  . insulin aspart  0-9 Units Subcutaneous TID WC  . lactulose  10 g Oral BID  . potassium chloride SA  40 mEq Oral Daily  . ursodiol  300 mg Oral TID   Continuous Infusions:

## 2015-01-08 NOTE — Progress Notes (Signed)
Per Dr Radford Pax, who reviewed the MV results, she feels the patient is at increased but acceptable risk for cardiac complications from surgery. Information passed on to the Orthopedic team. Note from Dr Radford Pax to follow.   Lenoard Aden 01/08/2015 3:56 PM [facility] [phone]

## 2015-01-09 ENCOUNTER — Encounter (HOSPITAL_COMMUNITY): Payer: Self-pay | Admitting: Orthopedic Surgery

## 2015-01-09 DIAGNOSIS — K7460 Unspecified cirrhosis of liver: Secondary | ICD-10-CM

## 2015-01-09 LAB — BASIC METABOLIC PANEL
Anion gap: 6 (ref 5–15)
BUN: 18 mg/dL (ref 6–20)
CHLORIDE: 101 mmol/L (ref 101–111)
CO2: 27 mmol/L (ref 22–32)
CREATININE: 1.26 mg/dL — AB (ref 0.44–1.00)
Calcium: 8 mg/dL — ABNORMAL LOW (ref 8.9–10.3)
GFR, EST AFRICAN AMERICAN: 46 mL/min — AB (ref >60)
GFR, EST NON AFRICAN AMERICAN: 39 mL/min — AB (ref >60)
GLUCOSE: 177 mg/dL — AB (ref 65–99)
Potassium: 4.7 mmol/L (ref 3.5–5.1)
Sodium: 134 mmol/L — ABNORMAL LOW (ref 135–145)

## 2015-01-09 LAB — GLUCOSE, CAPILLARY
GLUCOSE-CAPILLARY: 161 mg/dL — AB (ref 65–99)
GLUCOSE-CAPILLARY: 162 mg/dL — AB (ref 65–99)
GLUCOSE-CAPILLARY: 172 mg/dL — AB (ref 65–99)
Glucose-Capillary: 121 mg/dL — ABNORMAL HIGH (ref 65–99)
Glucose-Capillary: 134 mg/dL — ABNORMAL HIGH (ref 65–99)

## 2015-01-09 LAB — CBC
HEMATOCRIT: 28.3 % — AB (ref 36.0–46.0)
HEMOGLOBIN: 9.4 g/dL — AB (ref 12.0–15.0)
MCH: 33 pg (ref 26.0–34.0)
MCHC: 33.2 g/dL (ref 30.0–36.0)
MCV: 99.3 fL (ref 78.0–100.0)
Platelets: 202 10*3/uL (ref 150–400)
RBC: 2.85 MIL/uL — ABNORMAL LOW (ref 3.87–5.11)
RDW: 17.1 % — AB (ref 11.5–15.5)
WBC: 6.5 10*3/uL (ref 4.0–10.5)

## 2015-01-09 MED ORDER — ASPIRIN 325 MG PO TBEC: 325.0000 mg | DELAYED_RELEASE_TABLET | Freq: Two times a day (BID) | ORAL | Status: AC

## 2015-01-09 MED ORDER — HYDROCODONE-ACETAMINOPHEN 5-325 MG PO TABS: 1.0000 | ORAL_TABLET | Freq: Four times a day (QID) | ORAL | Status: DC | PRN

## 2015-01-09 NOTE — Clinical Documentation Improvement (Signed)
Orthopedic  Can the diagnosis of Malnutrition be further specified?   Document Severity - Severe(third degree), Moderate (second degree), Mild (first degree)  Other condition  Unable to clinically determine  Supporting Information: :  BMI= 30.51   Please exercise your independent, professional judgment when responding. A specific answer is not anticipated or expected.   Thank You, Rolm Gala, RN, [place] [phone]

## 2015-01-09 NOTE — Care Management Note (Signed)
Case Management Note  Patient Details  Name: Lonie Rummell MRN: [id number] Date of Birth: 04/07/1933  Subjective/Objective: PT-recc SNF.CSW already following.                   Action/Plan:d/c plan SNF.   Expected Discharge Date:   (UNKNOWN)               Expected Discharge Plan:  Skilled Nursing Facility  In-House Referral:  Clinical Social Work  Discharge planning Services  CM Consult  Post Acute Care Choice:  Home Health (Active w/[facility] HHRN/PT/OT) Choice offered to:     DME Arranged:    DME Agency:     HH Arranged:    [place] Agency:     Status of Service:  In process, will continue to follow  Medicare Important Message Given:  Yes-second notification given Date Medicare IM Given:    Medicare IM give by:    Date Additional Medicare IM Given:    Additional Medicare Important Message give by:     If discussed at [place] of Stay Meetings, dates discussed:    Additional Comments:  Dessa Phi, RN 01/09/2015, 12:20 PM

## 2015-01-09 NOTE — Discharge Summary (Signed)
Physician Discharge Summary  Kam Kushnir BJS:[id number] DOB: 1933/05/12 DOA: 01/06/2015  PCP: Curly Rim, MD  Admit date: 01/06/2015 Discharge date: 01/10/2015  Recommendations for Outpatient Follow-up:  1. Pt will need to follow up with PCP in 2-3 weeks post discharge 2. Please obtain BMP to evaluate electrolytes and kidney function 3. Please also check CBC to evaluate Hg and Hct levels 4. Aspirin 325 mg PO BID for DVT prophylaxis for at least 2 weeks  5. Needs orthopedic follow up in 2 weeks   Discharge Diagnoses:  Principal Problem:   Hip fracture ([place]) Active Problems:   Preoperative cardiovascular examination   Cardiomyopathy ([place])   Diabetes mellitus with renal manifestations, uncontrolled ([place])   CKD (chronic kidney disease) stage 3, GFR 30-59 ml/min   Anemia of chronic disease   Obesity, Class I, BMI 30-34.9   Diabetic nephropathy (HCC)   Cirrhosis of liver with ascites (HCC)   Displaced fracture of right femoral neck (HCC)  Discharge Condition: Stable  Diet recommendation: Heart healthy diet discussed in details    Brief narrative:    81 y.o. female with HTN, Liver disease, cirrhosis by CT scan, Diabetes, Systolic HF ef 35 -40 % by ECHO 12-2014, last admission to hospital for syncope secondary to hypoglycemia, presented to [facility] ED after an episode of fall at home while she was trying to get up from bed to wheelchair. She has denies chest pain or shortness of breath, no abd or urinary concerns.   In ED, pt noted to be hemodynamically stable, VS notable for SBP in 180's otherwise stable, blood worn notable for Cr 1.17, Hg 10. Imaging studies notable for right femoral neck fracture. Ortho team consulted as well as cardiology team to provide clearance.   Assessment/Plan:    Principal Problem:  Hip fracture ([place]), right femoral neck, initial encounter  - after what appears to be mechanical fall - s/p hip repair - continue analgesia as needed - SNF in  AM  Active Problems:  Preoperative cardiovascular examination - appreciate cardiology following - nuclear stress test with mall basal to mid inferolateral defect that is seen on both rest and stress but slightly worse on stress and is most likely due to diaphragmatic attenuation and unlikely to represent ischemia. EF normal.  - Patient is low risk from cardiac standpoint for surgery and has not had any CP or SOB.    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, uncontrolled ([place]) - resume home medical regimen upon discharge    CKD (chronic kidney disease) stage 3, GFR 30-59 ml/min - Cr is at baseline   Anemia of chronic disease, CKD - Hg at baseline, no signs of bleeding - CBC in AM   Obesity, Class I, BMI 30-34.9   Diabetic nephropathy ([place]) - continue Gabapentin    Cirrhosis of liver with ascites ([place]) - outpatient follow up  DVT prophylaxis - aspirin 325 mg BID for at least 2 weeks, needs to see ortho doctor for follow up to determine when to stop and transition to aspirin QD per previous home regimen   Code Status: DNR Family Communication: plan of care discussed with the patient Disposition Plan: SNF in AM  IV access:  Peripheral IV  Procedures and diagnostic studies:   Chest Portable 1  View 01/06/2015 No active cardiopulmonary disease. Mild increased pulmonary markings identified bilaterally, probably chronic.   Dg Hip Unilat With Pelvis 2-3 Views Right 01/06/2015 Right femoral neck fracture with varus angulation.   Medical Consultants:  Ortho  Cardiology for medical clearance   Other Consultants:  PT  IAnti-Infectives:   Keflex for recent UTI     Discharge Exam: Filed Vitals:   01/09/15 0529  BP: 133/68   Pulse: 65  Temp: 97.9 F (36.6 C)  Resp: 16   Filed Vitals:   01/08/15 1945 01/08/15 1952 01/09/15 0207 01/09/15 0529  BP: 159/68 155/72 149/63 133/68  Pulse: 68  69 65  Temp:  97.4 F (36.3 C) 98.1 F (36.7 C) 97.9 F (36.6 C)  TempSrc:   Oral Oral  Resp: 12 12 12 16   Height:      Weight:    79.9 kg (176 lb 2.4 oz)  SpO2: 98% 95% 98% 98%    General: Pt is alert, intermittently confused, NAD Cardiovascular: Regular rate and rhythm, no rubs, no gallops Respiratory: Clear to auscultation bilaterally, no wheezing, no crackles, no rhonchi Abdominal: Soft, non tender, non distended, bowel sounds +, no guarding  Discharge Instructions     Medication List    TAKE these medications        acetaminophen 500 MG tablet  Commonly known as:  TYLENOL  Take 500 mg by mouth every 6 (six) hours as needed for moderate pain.     acidophilus Caps capsule  Take 1 capsule by mouth daily.     aspirin 325 MG EC tablet  Take 1 tablet (325 mg total) by mouth 2 (two) times daily after a meal.     buPROPion 300 MG 24 hr tablet  Commonly known as:  WELLBUTRIN XL  Take 300 mg by mouth daily.     carvedilol 6.25 MG tablet  Commonly known as:  COREG  Take 1 tablet (6.25 mg total) by mouth 2 (two) times daily with a meal.     cephALEXin 500 MG capsule  Commonly known as:  KEFLEX  Take 1 capsule (500 mg total) by mouth 2 (two) times daily.     cholecalciferol 1000 UNITS tablet  Commonly known as:  VITAMIN D  Take 2,000 Units by mouth daily.     furosemide 40 MG tablet  Commonly known as:  LASIX  Take 1 tablet (40 mg total) by mouth daily.     gabapentin 300 MG capsule  Commonly known as:  NEURONTIN  Take 1 capsule (300 mg total) by mouth at bedtime.     HYDROcodone-acetaminophen 5-325 MG tablet  Commonly known as:  NORCO/VICODIN  Take 1-2 tablets by mouth every 6 (six) hours as needed for moderate pain.     insulin aspart 100 UNIT/ML injection  Commonly known as:  novoLOG   Inject 3 Units into the skin 3 (three) times daily with meals.     insulin detemir 100 UNIT/ML injection  Commonly known as:  LEVEMIR  Inject 0.08 mLs (8 Units total) into the skin at bedtime.     lactulose 10 GM/15ML solution  Commonly known as:  CHRONULAC  Take 15 mLs (10 g total) by mouth 2 (two) times daily.     lovastatin 20 MG tablet  Commonly known as:  MEVACOR  Take 20 mg by mouth at bedtime.     potassium chloride SA 20 MEQ tablet  Commonly known as:  K-DUR,KLOR-CON  Take 2 tablets (40 mEq total) by mouth daily.  ranitidine 300 MG tablet  Commonly known as:  ZANTAC  Take 300 mg by mouth 3 times/day as needed-between meals & bedtime for heartburn.     ursodiol 300 MG capsule  Commonly known as:  ACTIGALL  Take 300 mg by mouth 3 (three) times daily.     Vitamin B 12 100 MCG Lozg  Take 1 tablet by mouth every other day.            Follow-up Information    Follow up with [place], Horald Pollen, MD. Schedule an appointment as soon as possible for a visit in 2 weeks.   Specialty:  Orthopedic Surgery   Contact information:   [place] [address] [phone]       Follow up with [facility].   Specialty:  [place] information:   [address] [place] [phone]      Follow up with Curly Rim, MD.   Specialty:  Family Medicine   Contact information:   [place] [address] [place] [address] [phone]        The results of significant diagnostics from this hospitalization (including imaging, microbiology, ancillary and laboratory) are listed below for reference.     Microbiology: Recent Results (from the past 240 hour(s))  Surgical pcr screen     Status: None   Collection Time: 01/07/15  2:47 AM  Result Value Ref Range Status   MRSA, PCR NEGATIVE NEGATIVE Final   Staphylococcus aureus NEGATIVE NEGATIVE Final    Comment:         The Xpert SA Assay (FDA approved for NASAL specimens in patients over 66 years of age), is one component of a comprehensive surveillance program.  Test performance has been validated by [facility] for patients greater than or equal to 48 year old. It is not intended to diagnose infection nor to guide or monitor treatment.      Labs: Basic Metabolic Panel:  Recent Labs Lab 01/06/15 1212 01/06/15 1605 01/07/15 0235 01/08/15 0510 01/09/15 0523  NA 137  --  137 136 134*  K 3.9  --  3.9 4.1 4.7  CL 106  --  107 103 101  CO2 26  --  28 28 27   GLUCOSE 159*  --  86 160* 177*  BUN 17  --  16 15 18   CREATININE 1.28*  --  1.17* 1.19* 1.26*  CALCIUM 8.5*  --  7.9* 8.1* 8.0*  MG  --  1.8  --   --   --    Liver Function Tests:  Recent Labs Lab 01/06/15 1605  AST 41  ALT 20  ALKPHOS 187*  BILITOT 1.6*  PROT 8.1  ALBUMIN 2.7*   CBC:  Recent Labs Lab 01/06/15 1212 01/07/15 0235 01/08/15 0510 01/09/15 0523  WBC 7.0 6.6 7.7 6.5  NEUTROABS 4.9  --   --   --   HGB 10.7* 10.0* 10.5* 9.4*  HCT 32.4* 31.2* 31.2* 28.3*  MCV 101.6* 101.3* 101.3* 99.3  PLT 213 205 218 202   Cardiac Enzymes:  Recent Labs Lab 01/06/15 1605 01/06/15 2115 01/07/15 0235  TROPONINI <0.03 <0.03 <0.03   CBG:  Recent Labs Lab 01/08/15 1334 01/08/15 1854 01/08/15 2015 01/09/15 0753 01/09/15 1147  GLUCAP 167* 121* 128* 161* 134*   SIGNED: Time coordinating discharge: 30 minutes  MAGICK-Velma Agnes, MD  [facility] 01/09/2015, 1:36 PM Pager [phone]  If 7PM-7AM, please  contact night-coverage [email or web address] Password [email or web address]

## 2015-01-09 NOTE — Evaluation (Signed)
Physical Therapy Evaluation Patient Details Name: Tamara Stephenson MRN: [id number] DOB: 24-May-1933 Today's Date: 01/09/2015   History of Present Illness  81 yo female is s/p R hip hemiarthroplasty anterior approach after a fall at home resulting in R femoral neck fx . Pt with hx of frequent falls, HTN, liver disease, diabetes, and systolic HF.    Clinical Impression  Pt admitted with above diagnosis. Pt currently with functional limitations due to the deficits listed below (see PT Problem List). Pt will benefit from skilled PT to increase their independence and safety with mobility to allow discharge to the venue listed below. Pt did well ambulating 30 feet today, no increase in pain, but R knee buckled once; plan is to d/c to SNF at this time, pt doesn't seem to have family support to help with her recovery.       Follow Up Recommendations SNF;Supervision for mobility/OOB    Equipment Recommendations  None recommended by PT    Recommendations for Other Services       Precautions / Restrictions Precautions Precautions: Fall Restrictions Weight Bearing Restrictions: No RLE Weight Bearing: Weight bearing as tolerated      Mobility  Bed Mobility Overal bed mobility: Needs Assistance Bed Mobility: Supine to Sit     Supine to sit: Mod assist     General bed mobility comments: min assist with both LEs off the bed and trunk forward, mod assist with hip scooting, cues for UE placement; pt used bed rails to self-assist with trunk   Transfers Overall transfer level: Needs assistance Equipment used: Rolling walker (2 wheeled) Transfers: Sit to/from Stand Sit to Stand: Min assist         General transfer comment: steady assist with ascend and descent, cues for pushing off the bed with UE and hand placement on the walker. Pt was cautious and fearful of putting weight onto RLE.   Ambulation/Gait Ambulation/Gait assistance: Min assist Ambulation Distance (Feet): 30 Feet Assistive  device: Rolling walker (2 wheeled) Gait Pattern/deviations: Step-to pattern;Decreased stance time - right;Decreased step length - left;Antalgic;Decreased dorsiflexion - right Gait velocity: decreased   General Gait Details: multimodal cues for weight shift onto RLE, verbal cues for R limb progression and proper use of walker. Pt's R knee buckled once during ambulation, no LOB was observed, provided min assist to recover balance.   Stairs            Wheelchair Mobility    Modified Rankin (Stroke Patients Only)       Balance Overall balance assessment: History of Falls         Standing balance support: Bilateral upper extremity supported Standing balance-Leahy Scale: Poor                               Pertinent Vitals/Pain Pain Assessment: Faces (pt was fearful of pain) Faces Pain Scale: Hurts a little bit Pain Location: R hip Pain Descriptors / Indicators: Cramping Pain Intervention(s): Limited activity within patient's tolerance;Monitored during session;Premedicated before session;Repositioned;Ice applied    Home Living Family/patient expects to be discharged to:: Unsure Living Arrangements: Alone Available Help at Discharge:  ("don't really have help") Type of Home: Apartment Home Access: Level entry     Home Layout: One level Home Equipment: Walker - 2 wheels;Bedside commode;Wheelchair - manual      Prior Function Level of Independence: Independent with assistive device(s)         Comments: Uses RW and WC  Hand Dominance        Extremity/Trunk Assessment   Upper Extremity Assessment: Overall WFL for tasks assessed;Defer to OT evaluation           Lower Extremity Assessment: RLE deficits/detail RLE Deficits / Details: R ankle decreased AROM dorsi- and plantarflexion d/t previous injury, decreased knee flexion AROM d/t fear of pain, hip strength at least 2/5 throughout     Cervical / Trunk Assessment: Normal  Communication    Communication: No difficulties  Cognition Arousal/Alertness: Awake/alert Behavior During Therapy: WFL for tasks assessed/performed Overall Cognitive Status: Within Functional Limits for tasks assessed                      General Comments      Exercises Total Joint Exercises Ankle Circles/Pumps: AROM;Both;10 reps;Supine Knee Flexion: AROM;Right;5 reps;Supine      Assessment/Plan    PT Assessment Patient needs continued PT services  PT Diagnosis Difficulty walking;Abnormality of gait;Acute pain   PT Problem List Decreased balance;Decreased activity tolerance;Decreased mobility;Decreased strength;Decreased range of motion;Decreased safety awareness;Decreased knowledge of use of DME  PT Treatment Interventions DME instruction;Gait training;Therapeutic activities;Therapeutic exercise;Patient/family education;Functional mobility training;Balance training   PT Goals (Current goals can be found in the Care Plan section) Acute Rehab PT Goals Patient Stated Goal: Pt wants to go back home to check on her 2 cats PT Goal Formulation: With patient Time For Goal Achievement: 01/30/15 Potential to Achieve Goals: Good    Frequency Min 3X/week   Barriers to discharge        Co-evaluation               End of Session Equipment Utilized During Treatment: Gait belt Activity Tolerance: Patient tolerated treatment well;No increased pain Patient left: in chair;with call bell/phone within reach;with chair alarm set Nurse Communication: Mobility status         Time: 0092-3300 PT Time Calculation (min) (ACUTE ONLY): 21 min   Charges:   PT Evaluation $Initial PT Evaluation Tier I: 1 Procedure     PT G Codes:        Shanon Seawright, SPT 20-Jan-2015, 11:21 AM

## 2015-01-09 NOTE — Progress Notes (Signed)
   Subjective:  Patient reports pain as mild to moderate.  No c/o.  Objective:   VITALS:   Filed Vitals:   01/08/15 1945 01/08/15 1952 01/09/15 0207 01/09/15 0529  BP: 159/68 155/72 149/63 133/68  Pulse: 68  69 65  Temp:  97.4 F (36.3 C) 98.1 F (36.7 C) 97.9 F (36.6 C)  TempSrc:   Oral Oral  Resp: 12 12 12 16   Height:      Weight:    79.9 kg (176 lb 2.4 oz)  SpO2: 98% 95% 98% 98%    ABD soft Sensation intact distally Intact pulses distally Dorsiflexion/Plantar flexion intact Incision: dressing C/D/I Compartment soft   Lab Results  Component Value Date   WBC 6.5 01/09/2015   HGB 9.4* 01/09/2015   HCT 28.3* 01/09/2015   MCV 99.3 01/09/2015   PLT 202 01/09/2015   BMET    Component Value Date/Time   NA 134* 01/09/2015 0523   K 4.7 01/09/2015 0523   CL 101 01/09/2015 0523   CO2 27 01/09/2015 0523   GLUCOSE 177* 01/09/2015 0523   BUN 18 01/09/2015 0523   CREATININE 1.26* 01/09/2015 0523   CALCIUM 8.0* 01/09/2015 0523   GFRNONAA 39* 01/09/2015 0523   GFRAA 46* 01/09/2015 0523     Assessment/Plan: 1 Day Post-Op   Principal Problem:   Hip fracture (HCC) Active Problems:   CKD (chronic kidney disease) stage 3, GFR 30-59 ml/min   Anemia of chronic disease   Diabetes mellitus with renal manifestations, uncontrolled (HCC)   Obesity, Class I, BMI 30-34.9   Diabetic nephropathy (HCC)   Cirrhosis of liver with ascites (HCC)   Preoperative cardiovascular examination   Cardiomyopathy ([place])   Displaced fracture of right femoral neck ([place])   WBAT with walker DVT ppx: ASA 325mg  PO BID, SCDs, TEDs PO pain control PT/OT D/C planning   Siennah Barrasso, Horald Pollen 01/09/2015, 7:30 AM   Rod Can, MD Cell [phone]

## 2015-01-09 NOTE — Evaluation (Signed)
Occupational Therapy Evaluation Patient Details Name: Tamara Stephenson MRN: [id number] DOB: 22-Jul-1933 Today's Date: 01/09/2015    History of Present Illness 81 yo female is s/p R hip hemiarthroplasty anterior approach after a fall at home resulting in R femoral neck fx . Pt with hx of frequent falls, HTN, liver disease, diabetes, and systolic HF.     Clinical Impression   Pt was admitted for the above. At baseline, she is mostly mod I.  Pt would benefit from skilled OT in acute and follow up at SNF prior to ALF.  Pt currently needs min A overall for adls and goals are for supervision in acute setting    Follow Up Recommendations  SNF    Equipment Recommendations  3 in 1 bedside comode (likely; pt has a grab bar, one side of toilet)    Recommendations for Other Services       Precautions / Restrictions Precautions Precautions: Fall Restrictions Weight Bearing Restrictions: No RLE Weight Bearing: Weight bearing as tolerated      Mobility Bed Mobility Overal bed mobility: Needs Assistance Bed Mobility: Supine to Sit     Supine to sit: Mod assist     General bed mobility comments: oob  Transfers Overall transfer level: Needs assistance Equipment used: Rolling walker (2 wheeled) Transfers: Sit to/from Stand Sit to Stand: Min assist         General transfer comment: steadying assistance; cues for UE/LE placement    Balance Overall balance assessment: History of Falls         Standing balance support: Bilateral upper extremity supported Standing balance-Leahy Scale: Poor                              ADL       Grooming: Wash/dry hands;Supervision/safety;Standing   Upper Body Bathing: Set up;Sitting   Lower Body Bathing: Minimal assistance;Sit to/from stand   Upper Body Dressing : Set up;Sitting   Lower Body Dressing: Minimal assistance;Sit to/from stand (without socks)                 General ADL Comments: pt states she slid out of  w/c.  She uses w/c most of the time for mobility.  She does not wear socks and has a Secondary school teacher at home. Ambulated to bathroom with min A.  Pt pushes walker far from her body--needed physical assistance to keep at safer distance     Vision     Perception     Praxis      Pertinent Vitals/Pain Pain Assessment: No/denies pain Faces Pain Scale: Hurts a little bit Pain Location: R hip Pain Descriptors / Indicators: Cramping Pain Intervention(s): Limited activity within patient's tolerance;Monitored during session;Premedicated before session;Repositioned;Ice applied     Hand Dominance     Extremity/Trunk Assessment Upper Extremity Assessment Upper Extremity Assessment: Overall WFL for tasks assessed;Defer to OT evaluation RUE Deficits / Details: continues to have pain in R shoulder:  AROM limited approximately 60      Cervical / Trunk Assessment Cervical / Trunk Assessment: Normal   Communication Communication Communication: No difficulties   Cognition Arousal/Alertness: Awake/alert Behavior During Therapy: WFL for tasks assessed/performed Overall Cognitive Status: No family/caregiver present to determine baseline cognitive functioning (some difficulty with home questions); able to answer later in session with extra time; decreased safety with RW                     General Comments  Exercises       Shoulder Instructions      Home Living Family/patient expects to be discharged to:: Unsure Living Arrangements: Alone Available Help at Discharge:  ("don't really have help") Type of Home: Apartment Home Access: Level entry     Home Layout: One level               Home Equipment: Walker - 2 wheels;Bedside commode;Wheelchair - manual   Additional Comments: from [facility] ALF--recommend SNF for rehab      Prior Functioning/Environment Level of Independence: Independent with assistive device(s)        Comments: Uses RW and WC    OT  Diagnosis: Generalized weakness   OT Problem List: Decreased strength;Decreased activity tolerance;Decreased safety awareness;Impaired balance (sitting and/or standing)   OT Treatment/Interventions: Self-care/ADL training;DME and/or AE instruction;Patient/family education;Balance training    OT Goals(Current goals can be found in the care plan section) Acute Rehab OT Goals Patient Stated Goal: Pt wants to go back home to check on her 2 cats OT Goal Formulation: With patient Time For Goal Achievement: 01/23/15 Potential to Achieve Goals: Good ADL Goals Pt Will Perform Lower Body Bathing: with supervision;with adaptive equipment;sit to/from stand Pt Will Perform Lower Body Dressing: with supervision;with adaptive equipment;sit to/from stand (no socks) Pt Will Transfer to Toilet: with supervision;bedside commode;stand pivot transfer Pt Will Perform Toileting - Clothing Manipulation and hygiene: with supervision;sit to/from stand  OT Frequency: Min 2X/week   Barriers to D/C:            Co-evaluation              End of Session    Activity Tolerance: Patient tolerated treatment well Patient left: in chair;with call bell/phone within reach;with chair alarm set   Time: 1225-1250 OT Time Calculation (min): 25 min Charges:  OT General Charges $OT Visit: 1 Procedure OT Evaluation $Initial OT Evaluation Tier I: 1 Procedure OT Treatments $Self Care/Home Management : 8-22 mins G-Codes:    Parsa Rickett 01-28-15, 2:00 PM Lesle Chris, OTR/L [phone] January 28, 2015

## 2015-01-09 NOTE — Care Management Important Message (Signed)
Important Message  Patient Details  Name: Uma Jerde MRN: [id number] Date of Birth: 10-Mar-1933   Medicare Important Message Given:  Yes-second notification given    Camillo Flaming 01/09/2015, 11:03 AMImportant Message  Patient Details  Name: Katharine Rochefort MRN: [id number] Date of Birth: 22-Apr-1933   Medicare Important Message Given:  Yes-second notification given    Camillo Flaming 01/09/2015, 11:02 AM

## 2015-01-09 NOTE — Progress Notes (Signed)
CSW continuing to follow.   Pt admitted from [facility] and plans to discharge to [facility].   Per MD, anticipate discharge to [facility] tomorrow.   CSW confirmed with [facility] that facility could accept pt back tomorrow.   Weekend CSW to facilitate pt discharge needs.  Alison Murray, MSW, [place] Social Work Coverage [phone]

## 2015-01-10 DIAGNOSIS — S72001D Fracture of unspecified part of neck of right femur, subsequent encounter for closed fracture with routine healing: Secondary | ICD-10-CM

## 2015-01-10 LAB — CBC
HCT: 28.1 % — ABNORMAL LOW (ref 36.0–46.0)
Hemoglobin: 9.5 g/dL — ABNORMAL LOW (ref 12.0–15.0)
MCH: 33.8 pg (ref 26.0–34.0)
MCHC: 33.8 g/dL (ref 30.0–36.0)
MCV: 100 fL (ref 78.0–100.0)
PLATELETS: 222 10*3/uL (ref 150–400)
RBC: 2.81 MIL/uL — ABNORMAL LOW (ref 3.87–5.11)
RDW: 17.2 % — AB (ref 11.5–15.5)
WBC: 7.7 10*3/uL (ref 4.0–10.5)

## 2015-01-10 LAB — BASIC METABOLIC PANEL
ANION GAP: 6 (ref 5–15)
BUN: 26 mg/dL — ABNORMAL HIGH (ref 6–20)
CALCIUM: 8.5 mg/dL — AB (ref 8.9–10.3)
CO2: 30 mmol/L (ref 22–32)
CREATININE: 1.66 mg/dL — AB (ref 0.44–1.00)
Chloride: 99 mmol/L — ABNORMAL LOW (ref 101–111)
GFR, EST AFRICAN AMERICAN: 33 mL/min — AB (ref >60)
GFR, EST NON AFRICAN AMERICAN: 28 mL/min — AB (ref >60)
Glucose, Bld: 149 mg/dL — ABNORMAL HIGH (ref 65–99)
Potassium: 4.6 mmol/L (ref 3.5–5.1)
SODIUM: 135 mmol/L (ref 135–145)

## 2015-01-10 LAB — GLUCOSE, CAPILLARY
GLUCOSE-CAPILLARY: 144 mg/dL — AB (ref 65–99)
GLUCOSE-CAPILLARY: 229 mg/dL — AB (ref 65–99)

## 2015-01-10 NOTE — Clinical Social Work Placement (Signed)
   CLINICAL SOCIAL WORK PLACEMENT  NOTE  Date:  01/10/2015  Patient Details  Name: Tamara Stephenson MRN: [id number] Date of Birth: Aug 02, 1933  Clinical Social Work is seeking post-discharge placement for this patient at the   level of care (*CSW will initial, date and re-position this form in  chart as items are completed):      Patient/family provided with [place] Work Department's list of facilities offering this level of care within the geographic area requested by the patient (or if unable, by the patient's family).      Patient/family informed of their freedom to choose among providers that offer the needed level of care, that participate in Medicare, Medicaid or managed care program needed by the patient, have an available bed and are willing to accept the patient.      Patient/family informed of [facility]'s ownership interest in [facility] and [facility], as well as of the fact that they are under no obligation to receive care at these facilities.  PASRR submitted to EDS on       PASRR number received on       Existing PASRR number confirmed on       FL2 transmitted to all facilities in geographic area requested by pt/family on       FL2 transmitted to all facilities within larger geographic area on       Patient informed that his/her managed care company has contracts with or will negotiate with certain facilities, including the following:            Patient/family informed of bed offers received.  Patient chooses bed at       [facility]   [facility] on  .January 10, 2015  Patient to be transferred to facility by   ambulance    Patient family notified on  January 10, 2015 of transfer.  Name of family member notified:   patient did not want anyone called     PHYSICIAN       Additional Comment:     _______________________________________________ Carlean Jews, LCSW 01/10/2015, 2:31 PM

## 2015-01-10 NOTE — NC FL2 (Signed)
[place] MEDICAID FL2 LEVEL OF CARE SCREENING TOOL     IDENTIFICATION  Patient Name: Tamara Stephenson Birthdate: Jan 22, 1934 Sex: female Admission Date (Current Location): 01/06/2015  [facility] and [place] Number: [address] and Address:  [facility],  [place] [address], [place]      Provider Number: [id number]  Attending Physician Name and Address:  Robbie Lis, MD  Relative Name and Phone Number:       Current Level of Care: Hospital Recommended Level of Care: [place] Prior Approval Number:    Date Approved/Denied:   PASRR Number:    Discharge Plan: SNF    Current Diagnoses: Patient Active Problem List   Diagnosis Date Noted  . Displaced fracture of right femoral neck ([place]) 01/08/2015  . Preoperative cardiovascular examination 01/07/2015  . Cardiomyopathy ([place]) 01/07/2015  . Hip fracture ([place]) 01/06/2015  . Cirrhosis of liver with ascites ([place]) 12/26/2014  . CKD (chronic kidney disease) stage 3, GFR 30-59 ml/min 09/16/2014  . Anemia of chronic disease 09/16/2014  . Diabetes mellitus with renal manifestations, uncontrolled ([place]) 09/16/2014  . Obesity, Class I, BMI 30-34.9 09/16/2014  . Diabetic nephropathy ([place]) 09/16/2014    Orientation ACTIVITIES/SOCIAL BLADDER RESPIRATION    Self, Time, Situation, Place  Active Continent Normal  BEHAVIORAL SYMPTOMS/MOOD NEUROLOGICAL BOWEL NUTRITION STATUS      Continent    PHYSICIAN VISITS COMMUNICATION OF NEEDS Height & Weight Skin    Verbally 5\' 6"  (167.6 cm) 188 lbs. Normal          AMBULATORY STATUS RESPIRATION    Assist extensive Normal      Personal Care Assistance Level of Assistance  Bathing, Dressing Bathing Assistance: Limited assistance   Dressing Assistance: Limited assistance      Functional Limitations Info                SPECIAL CARE FACTORS FREQUENCY  PT (By licensed PT), OT (By licensed OT)     PT Frequency: 5 OT Frequency: 5            Additional Factors Info  Code Status Code Status Info: dnr             Current Medications (01/10/2015): Current Facility-Administered Medications  Medication Dose Route Frequency Provider Last Rate Last Dose  . acetaminophen (TYLENOL) tablet 650 mg  650 mg Oral Q6H PRN Rod Can, MD       Or  . acetaminophen (TYLENOL) suppository 650 mg  650 mg Rectal Q6H PRN Rod Can, MD      . aspirin EC tablet 325 mg  325 mg Oral BID PC Rod Can, MD   325 mg at 01/10/15 0802  . buPROPion (WELLBUTRIN XL) 24 hr tablet 300 mg  300 mg Oral Daily Belkys A Regalado, MD   300 mg at 01/10/15 1045  . carvedilol (COREG) tablet 6.25 mg  6.25 mg Oral BID WC Belkys A Regalado, MD   6.25 mg at 01/10/15 0802  . cephALEXin (KEFLEX) capsule 500 mg  500 mg Oral BID Theodis Blaze, MD   500 mg at 01/10/15 1045  . furosemide (LASIX) tablet 40 mg  40 mg Oral Daily Belkys A Regalado, MD   40 mg at 01/10/15 1044  . gabapentin (NEURONTIN) capsule 300 mg  300 mg Oral QHS Belkys A Regalado, MD   300 mg at 01/09/15 2205  . HYDROcodone-acetaminophen (NORCO/VICODIN) 5-325 MG per tablet 1-2 tablet  1-2 tablet Oral Q6H PRN Rod Can, MD  2 tablet at 01/09/15 0935  . HYDROmorphone (DILAUDID) injection 0.25-0.5 mg  0.25-0.5 mg Intravenous Q5 min PRN Rod Mae, MD      . insulin aspart (novoLOG) injection 0-9 Units  0-9 Units Subcutaneous TID WC Elmarie Shiley, MD   1 Units at 01/10/15 0804  . lactulose (CHRONULAC) 10 GM/15ML solution 10 g  10 g Oral BID Belkys A Regalado, MD   10 g at 01/10/15 1047  . menthol-cetylpyridinium (CEPACOL) lozenge 3 mg  1 lozenge Oral PRN Rod Can, MD       Or  . phenol (CHLORASEPTIC) mouth spray 1 spray  1 spray Mouth/Throat PRN Rod Can, MD      . metoCLOPramide (REGLAN) tablet 5-10 mg  5-10 mg Oral Q8H PRN Rod Can, MD       Or  . metoCLOPramide (REGLAN) injection 5-10 mg  5-10 mg Intravenous Q8H PRN Rod Can, MD      . morphine 2 MG/ML  injection 0.5 mg  0.5 mg Intravenous Q2H PRN Belkys A Regalado, MD   0.5 mg at 01/08/15 1026  . ondansetron (ZOFRAN) tablet 4 mg  4 mg Oral Q6H PRN Rod Can, MD       Or  . ondansetron [facility] injection 4 mg  4 mg Intravenous Q6H PRN Rod Can, MD   4 mg at 01/10/15 6433  . potassium chloride SA (K-DUR,KLOR-CON) CR tablet 40 mEq  40 mEq Oral Daily Belkys A Regalado, MD   40 mEq at 01/10/15 1045  . ursodiol (ACTIGALL) capsule 300 mg  300 mg Oral TID Belkys A Regalado, MD   300 mg at 01/10/15 1045   Do not use this list as official medication orders. Please verify with discharge summary.  Discharge Medications:   Medication List    TAKE these medications        acetaminophen 500 MG tablet  Commonly known as:  TYLENOL  Take 500 mg by mouth every 6 (six) hours as needed for moderate pain.     acidophilus Caps capsule  Take 1 capsule by mouth daily.     aspirin 325 MG EC tablet  Take 1 tablet (325 mg total) by mouth 2 (two) times daily after a meal.     buPROPion 300 MG 24 hr tablet  Commonly known as:  WELLBUTRIN XL  Take 300 mg by mouth daily.     carvedilol 6.25 MG tablet  Commonly known as:  COREG  Take 1 tablet (6.25 mg total) by mouth 2 (two) times daily with a meal.     cephALEXin 500 MG capsule  Commonly known as:  KEFLEX  Take 1 capsule (500 mg total) by mouth 2 (two) times daily.     cholecalciferol 1000 UNITS tablet  Commonly known as:  VITAMIN D  Take 2,000 Units by mouth daily.     furosemide 40 MG tablet  Commonly known as:  LASIX  Take 1 tablet (40 mg total) by mouth daily.     gabapentin 300 MG capsule  Commonly known as:  NEURONTIN  Take 1 capsule (300 mg total) by mouth at bedtime.     HYDROcodone-acetaminophen 5-325 MG tablet  Commonly known as:  NORCO/VICODIN  Take 1-2 tablets by mouth every 6 (six) hours as needed for moderate pain.     insulin aspart 100 UNIT/ML injection  Commonly known as:  novoLOG  Inject 3 Units into the skin 3  (three) times daily with meals.     insulin detemir 100 UNIT/ML  injection  Commonly known as:  LEVEMIR  Inject 0.08 mLs (8 Units total) into the skin at bedtime.     lactulose 10 GM/15ML solution  Commonly known as:  CHRONULAC  Take 15 mLs (10 g total) by mouth 2 (two) times daily.     lovastatin 20 MG tablet  Commonly known as:  MEVACOR  Take 20 mg by mouth at bedtime.     potassium chloride SA 20 MEQ tablet  Commonly known as:  K-DUR,KLOR-CON  Take 2 tablets (40 mEq total) by mouth daily.     ranitidine 300 MG tablet  Commonly known as:  ZANTAC  Take 300 mg by mouth 3 times/day as needed-between meals & bedtime for heartburn.     ursodiol 300 MG capsule  Commonly known as:  ACTIGALL  Take 300 mg by mouth 3 (three) times daily.     Vitamin B 12 100 MCG Lozg  Take 1 tablet by mouth every other day.        Relevant Imaging Results:  Relevant Lab Results:  Recent Labs    Additional Information    Carlean Jews, LCSW

## 2015-01-10 NOTE — Progress Notes (Signed)
Pt seen and examined at the bedside. Medically stable for discharge to SNF today. Please refer to discharge summary done 01/09/2015. No changes in medical management since 01/09/2015.    Leisa Lenz [facility] [phone]

## 2015-01-10 NOTE — Progress Notes (Signed)
Tamara Stephenson  MRN: [id number] DOB/Age: 1933/05/10 81 y.o. Physician: [place] Procedure: Procedure(s) (LRB): RIGHT ANTERIOR APPROACH HEMI HIP ARTHROPLASTY (Right)     Subjective: Minimal hip pain. Ambulated short distance yesterday  Vital Signs Temp:  [97.6 F (36.4 C)-97.9 F (36.6 C)] 97.7 F (36.5 C) (11/05 0505) Pulse Rate:  [58-70] 70 (11/05 0505) Resp:  [14-16] 16 (11/05 0505) BP: (107-137)/(37-68) 137/37 mmHg (11/05 0505) SpO2:  [94 %-96 %] 96 % (11/05 0505) Weight:  [85.4 kg (188 lb 4.4 oz)] 85.4 kg (188 lb 4.4 oz) (11/05 0505)  Lab Results  Recent Labs  01/09/15 0523 01/10/15 0410  WBC 6.5 7.7  HGB 9.4* 9.5*  HCT 28.3* 28.1*  PLT 202 222   BMET  Recent Labs  01/09/15 0523 01/10/15 0410  NA 134* 135  K 4.7 4.6  CL 101 99*  CO2 27 30  GLUCOSE 177* 149*  BUN 18 26*  CREATININE 1.26* 1.66*  CALCIUM 8.0* 8.5*   INR  Date Value Ref Range Status  01/06/2015 1.09 0.00 - 1.49 Final     Exam Right hip dressing dry        Plan Agree with Dc to skilled when medicine feels stable, perhaps today Cont PT/OT WBAT [place] for DVT prophylaxis Kyshon Tolliver PA-C   01/10/2015, 9:08 AM Contact # [phone]

## 2015-01-10 NOTE — Progress Notes (Signed)
Patient ID: Tamara Stephenson, female   DOB: [birth year], 81 y.o.   MRN: [id number]    Subjective:  Denies SSCP, palpitations or Dyspnea Still unable to really urinate  Objective:  Filed Vitals:   01/09/15 1355 01/09/15 2123 01/10/15 0331 01/10/15 0505  BP: 110/68 107/56 122/40 137/37  Pulse: 67 58  70  Temp: 97.6 F (36.4 C) 97.9 F (36.6 C)  97.7 F (36.5 C)  TempSrc: Oral Oral  Oral  Resp: 14 16  16   Height:      Weight:    85.4 kg (188 lb 4.4 oz)  SpO2: 95% 94%  96%    Intake/Output from previous day:  Intake/Output Summary (Last 24 hours) at 01/10/15 0840 Last data filed at 01/09/15 1700  Gross per 24 hour  Intake    480 ml  Output    200 ml  Net    280 ml    Physical Exam: Affect appropriate Healthy:  appears stated age HEENT: normal Neck supple with no adenopathy JVP normal no bruits no thyromegaly Lungs clear with no wheezing and good diaphragmatic motion Heart:  S1/S2 SEM murmur, no rub, gallop or click PMI normal Abdomen: benighn, BS positve, no tenderness, no AAA no bruit.  No HSM or HJR Distal pulses intact with no bruits No edema Neuro non-focal Skin warm and dry S/P right hip fracture surgery    Lab Results: Basic Metabolic Panel:  Recent Labs  01/09/15 0523 01/10/15 0410  NA 134* 135  K 4.7 4.6  CL 101 99*  CO2 27 30  GLUCOSE 177* 149*  BUN 18 26*  CREATININE 1.26* 1.66*  CALCIUM 8.0* 8.5*   Liver Function Tests: No results for input(s): AST, ALT, ALKPHOS, BILITOT, PROT, ALBUMIN in the last 72 hours. No results for input(s): LIPASE, AMYLASE in the last 72 hours. CBC:  Recent Labs  01/09/15 0523 01/10/15 0410  WBC 6.5 7.7  HGB 9.4* 9.5*  HCT 28.3* 28.1*  MCV 99.3 100.0  PLT 202 222    Imaging: Pelvis Portable  01/08/2015  CLINICAL DATA:  Postop right hip arthroplasty. EXAM: DG C-ARM 1-60 MIN-NO REPORT; PORTABLE PELVIS 1-2 VIEWS COMPARISON:  Right hip fluoroscopy 01/08/2015. A right hip 01/06/2015 FINDINGS: Postoperative right  hip hemiarthroplasty using non cemented femoral component. Component appears well seated on single view. No dislocation at the hip joint. No acute fracture demonstrated. Degenerative changes noted in the left hip. Vascular calcifications. Soft tissue gas over the right hip consistent with recent surgery. IMPRESSION: Right hip hemiarthroplasty.  Component appears well seated. Electronically Signed   By: Lucienne Capers M.D.   On: 01/08/2015 19:29   Nm Myocar Multi W/spect W/wall Motion / Ef  01/08/2015  CLINICAL DATA:  Cardiomyopathy. Preoperative examination. History of diabetes and hypertension. EXAM: MYOCARDIAL IMAGING WITH SPECT (REST AND PHARMACOLOGIC-STRESS) GATED LEFT VENTRICULAR WALL MOTION STUDY LEFT VENTRICULAR EJECTION FRACTION TECHNIQUE: Standard myocardial SPECT imaging was performed after resting intravenous injection of 10 mCi Tc-52m sestamibi. Subsequently, intravenous infusion of Lexiscan was performed under the supervision of the Cardiology staff. At peak effect of the drug, 30 mCi Tc-33m sestamibi was injected intravenously and standard myocardial SPECT imaging was performed. Quantitative gated imaging was also performed to evaluate left ventricular wall motion, and estimate left ventricular ejection fraction. COMPARISON:  None. FINDINGS: Perfusion: There is a medium size defect of mild reversibility involving the mid anterior lateral and inferior lateral wall. No wall motion abnormality noted. Wall Motion: Normal left ventricular wall motion. No left ventricular dilation. Left  Ventricular Ejection Fraction: 53 % End diastolic volume 027 ml End systolic volume 47 ml IMPRESSION: 1. Single medium size area of mild reversibility involving the lateral wall. No corresponding wall motion abnormality. 2. Normal left ventricular wall motion. 3. Left ventricular ejection fraction 53%% 4. Low-risk stress test findings*. *2012 Appropriate Use Criteria for Coronary Revascularization Focused Update: J Am  Coll Cardiol. 7412;87(8):676-720. http://content.airportbarriers.com.aspx?articleid=[email or web address] These results will be called to the ordering clinician or representative by the Radiologist Assistant, and communication documented in the PACS or zVision Dashboard. Electronically Signed   By: Kerby Moors M.D.   On: 01/08/2015 15:23   Dg C-arm 1-60 Min-no Report  01/08/2015  CLINICAL DATA: hip C-ARM 1-60 MINUTES Fluoroscopy was utilized by the requesting physician.  No radiographic interpretation.   Dg Hip Operative Unilat With Pelvis Right  01/08/2015  CLINICAL DATA:  Right hip arthroplasty anterior approach 14 sec fluoro EXAM: OPERATIVE RIGHT HIP (WITH PELVIS IF PERFORMED) 2 VIEWS TECHNIQUE: Fluoroscopic spot image(s) were submitted for interpretation post-operatively. COMPARISON:  01/06/2015 FINDINGS: Patient has undergone right hip hemiarthroplasty. On the frontal views provided there is no evidence for dislocation. No new fractures are identified. IMPRESSION: Status post right hip arthroplasty.  No adverse features identified. Electronically Signed   By: Nolon Nations M.D.   On: 01/08/2015 18:24    Cardiac Studies:  ECG: SR nonspecific ST changes  11/3/1`6    Telemetry:  NSR no arrhythmia 01/10/2015   Echo: EF 30-35%  Mild AS   Medications:   . aspirin EC  325 mg Oral BID PC  . buPROPion  300 mg Oral Daily  . carvedilol  6.25 mg Oral BID WC  . cephALEXin  500 mg Oral BID  . furosemide  40 mg Oral Daily  . gabapentin  300 mg Oral QHS  . insulin aspart  0-9 Units Subcutaneous TID WC  . lactulose  10 g Oral BID  . potassium chloride SA  40 mEq Oral Daily  . ursodiol  300 mg Oral TID       Assessment/Plan:  CHF:  Appears euvolemic continue current dose of lasix and beta blocker presume no ACE due to elevated Cr CAD:  Low risk myovue prior to surgery no MI on ECG  No chest pain stable outpatient f/u Dr Radford Pax Ortho:  Needs rehab  Some urinary retention pain meds per primary   Jenkins Rouge 01/10/2015, 8:40 AM

## 2015-01-10 NOTE — Progress Notes (Signed)
Pt unable to void since removal of foley. Tamara Stephenson notified and in and out cath ordered. 300 cc's removed from bladder. Melisse Caetano, Bing Neighbors, RN

## 2015-01-10 NOTE — Clinical Social Work Note (Signed)
CSW called and spoke with [facility] for discharge planning.  CSW sent d/c summary in the hub and met with pt at bedside to assess for transportation.  Pt believes that her friend is coming to pick her up but she does not have her phone number.  Per the RN pt needs to urinate before leaving so CSW prepared the packet, left it with RN and will check back later for transportation needs.  Dede Query, LCSW [place] Worker - Weekend Coverage cell #: [phone]

## 2015-01-10 NOTE — Clinical Social Work Note (Signed)
Pt's friend is not picking her up CSW will call for transport via [facility]  .Dede Query, LCSW [facility] Clinical Social Worker - Weekend Coverage cell #: [phone]

## 2015-01-10 NOTE — Progress Notes (Signed)
Report called to Lattie Haw, Therapist, [facility].  Barbee [place]. Brigitte Pulse, RN

## 2015-05-02 ENCOUNTER — Emergency Department (HOSPITAL_COMMUNITY): Payer: Medicare HMO

## 2015-05-02 ENCOUNTER — Encounter (HOSPITAL_COMMUNITY): Payer: Self-pay | Admitting: Emergency Medicine

## 2015-05-02 ENCOUNTER — Inpatient Hospital Stay (HOSPITAL_COMMUNITY)
Admission: EM | Admit: 2015-05-02 | Discharge: 2015-05-05 | DRG: 194 | Disposition: A | Discharge status: Home Health | Payer: Medicare HMO | Attending: Internal Medicine | Admitting: Internal Medicine

## 2015-05-02 DIAGNOSIS — IMO0002 Reserved for concepts with insufficient information to code with codable children: Secondary | ICD-10-CM | POA: Diagnosis present

## 2015-05-02 DIAGNOSIS — I130 Hypertensive heart and chronic kidney disease with heart failure and stage 1 through stage 4 chronic kidney disease, or unspecified chronic kidney disease: Secondary | ICD-10-CM | POA: Diagnosis present

## 2015-05-02 DIAGNOSIS — E1140 Type 2 diabetes mellitus with diabetic neuropathy, unspecified: Secondary | ICD-10-CM | POA: Diagnosis present

## 2015-05-02 DIAGNOSIS — Z79899 Other long term (current) drug therapy: Secondary | ICD-10-CM

## 2015-05-02 DIAGNOSIS — Z91010 Allergy to peanuts: Secondary | ICD-10-CM | POA: Diagnosis not present

## 2015-05-02 DIAGNOSIS — I872 Venous insufficiency (chronic) (peripheral): Secondary | ICD-10-CM | POA: Diagnosis present

## 2015-05-02 DIAGNOSIS — E1122 Type 2 diabetes mellitus with diabetic chronic kidney disease: Secondary | ICD-10-CM | POA: Diagnosis present

## 2015-05-02 DIAGNOSIS — Z794 Long term (current) use of insulin: Secondary | ICD-10-CM | POA: Diagnosis not present

## 2015-05-02 DIAGNOSIS — I509 Heart failure, unspecified: Secondary | ICD-10-CM | POA: Diagnosis present

## 2015-05-02 DIAGNOSIS — L03115 Cellulitis of right lower limb: Secondary | ICD-10-CM | POA: Diagnosis present

## 2015-05-02 DIAGNOSIS — I6529 Occlusion and stenosis of unspecified carotid artery: Secondary | ICD-10-CM | POA: Diagnosis present

## 2015-05-02 DIAGNOSIS — E669 Obesity, unspecified: Secondary | ICD-10-CM | POA: Diagnosis present

## 2015-05-02 DIAGNOSIS — R4182 Altered mental status, unspecified: Secondary | ICD-10-CM | POA: Diagnosis not present

## 2015-05-02 DIAGNOSIS — E1165 Type 2 diabetes mellitus with hyperglycemia: Secondary | ICD-10-CM | POA: Diagnosis present

## 2015-05-02 DIAGNOSIS — J189 Pneumonia, unspecified organism: Secondary | ICD-10-CM | POA: Diagnosis present

## 2015-05-02 DIAGNOSIS — D638 Anemia in other chronic diseases classified elsewhere: Secondary | ICD-10-CM | POA: Diagnosis present

## 2015-05-02 DIAGNOSIS — E860 Dehydration: Secondary | ICD-10-CM | POA: Diagnosis present

## 2015-05-02 DIAGNOSIS — F17200 Nicotine dependence, unspecified, uncomplicated: Secondary | ICD-10-CM | POA: Diagnosis present

## 2015-05-02 DIAGNOSIS — Z806 Family history of leukemia: Secondary | ICD-10-CM | POA: Diagnosis not present

## 2015-05-02 DIAGNOSIS — N183 Chronic kidney disease, stage 3 unspecified: Secondary | ICD-10-CM | POA: Diagnosis present

## 2015-05-02 DIAGNOSIS — Z6833 Body mass index (BMI) 33.0-33.9, adult: Secondary | ICD-10-CM

## 2015-05-02 DIAGNOSIS — F329 Major depressive disorder, single episode, unspecified: Secondary | ICD-10-CM | POA: Diagnosis present

## 2015-05-02 DIAGNOSIS — N390 Urinary tract infection, site not specified: Secondary | ICD-10-CM | POA: Diagnosis present

## 2015-05-02 DIAGNOSIS — Z1612 Extended spectrum beta lactamase (ESBL) resistance: Secondary | ICD-10-CM | POA: Diagnosis present

## 2015-05-02 DIAGNOSIS — L8990 Pressure ulcer of unspecified site, unspecified stage: Secondary | ICD-10-CM | POA: Insufficient documentation

## 2015-05-02 DIAGNOSIS — E1121 Type 2 diabetes mellitus with diabetic nephropathy: Secondary | ICD-10-CM | POA: Diagnosis present

## 2015-05-02 DIAGNOSIS — Z7982 Long term (current) use of aspirin: Secondary | ICD-10-CM

## 2015-05-02 DIAGNOSIS — F32A Depression, unspecified: Secondary | ICD-10-CM | POA: Diagnosis present

## 2015-05-02 DIAGNOSIS — L03116 Cellulitis of left lower limb: Secondary | ICD-10-CM | POA: Diagnosis present

## 2015-05-02 DIAGNOSIS — E11649 Type 2 diabetes mellitus with hypoglycemia without coma: Secondary | ICD-10-CM | POA: Diagnosis present

## 2015-05-02 DIAGNOSIS — Y95 Nosocomial condition: Secondary | ICD-10-CM | POA: Diagnosis present

## 2015-05-02 DIAGNOSIS — E0821 Diabetes mellitus due to underlying condition with diabetic nephropathy: Secondary | ICD-10-CM | POA: Diagnosis not present

## 2015-05-02 DIAGNOSIS — E1129 Type 2 diabetes mellitus with other diabetic kidney complication: Secondary | ICD-10-CM | POA: Diagnosis present

## 2015-05-02 LAB — URINALYSIS, ROUTINE W REFLEX MICROSCOPIC
Bilirubin Urine: NEGATIVE
Glucose, UA: NEGATIVE mg/dL
Hgb urine dipstick: NEGATIVE
Ketones, ur: NEGATIVE mg/dL
Nitrite: NEGATIVE
PROTEIN: NEGATIVE mg/dL
Specific Gravity, Urine: 1.01 (ref 1.005–1.030)
pH: 5.5 (ref 5.0–8.0)

## 2015-05-02 LAB — MAGNESIUM: MAGNESIUM: 1.9 mg/dL (ref 1.7–2.4)

## 2015-05-02 LAB — CBC
HCT: 26.7 % — ABNORMAL LOW (ref 36.0–46.0)
HEMATOCRIT: 29.3 % — AB (ref 36.0–46.0)
HEMOGLOBIN: 9.4 g/dL — AB (ref 12.0–15.0)
Hemoglobin: 9.4 g/dL — ABNORMAL LOW (ref 12.0–15.0)
MCH: 36.3 pg — ABNORMAL HIGH (ref 26.0–34.0)
MCH: 32.3 pg (ref 26.0–34.0)
MCHC: 35.2 g/dL (ref 30.0–36.0)
MCHC: 32.1 g/dL (ref 30.0–36.0)
MCV: 103.1 fL — ABNORMAL HIGH (ref 78.0–100.0)
MCV: 100.7 fL — AB (ref 78.0–100.0)
PLATELETS: 222 10*3/uL (ref 150–400)
Platelets: 225 10*3/uL (ref 150–400)
RBC: 2.59 MIL/uL — AB (ref 3.87–5.11)
RBC: 2.91 MIL/uL — ABNORMAL LOW (ref 3.87–5.11)
RDW: 18.6 % — ABNORMAL HIGH (ref 11.5–15.5)
RDW: 18.6 % — AB (ref 11.5–15.5)
WBC: 7.1 10*3/uL (ref 4.0–10.5)
WBC: 7 10*3/uL (ref 4.0–10.5)

## 2015-05-02 LAB — COMPREHENSIVE METABOLIC PANEL
ALT: 18 U/L (ref 14–54)
ANION GAP: 8 (ref 5–15)
AST: 36 U/L (ref 15–41)
Albumin: 1.9 g/dL — ABNORMAL LOW (ref 3.5–5.0)
Alkaline Phosphatase: 187 U/L — ABNORMAL HIGH (ref 38–126)
BUN: 39 mg/dL — ABNORMAL HIGH (ref 6–20)
CHLORIDE: 116 mmol/L — AB (ref 101–111)
CO2: 21 mmol/L — AB (ref 22–32)
CREATININE: 2.09 mg/dL — AB (ref 0.44–1.00)
Calcium: 8.4 mg/dL — ABNORMAL LOW (ref 8.9–10.3)
GFR, EST AFRICAN AMERICAN: 25 mL/min — AB (ref >60)
GFR, EST NON AFRICAN AMERICAN: 21 mL/min — AB (ref >60)
Glucose, Bld: 55 mg/dL — ABNORMAL LOW (ref 65–99)
POTASSIUM: 3.8 mmol/L (ref 3.5–5.1)
SODIUM: 145 mmol/L (ref 135–145)
Total Bilirubin: 2.5 mg/dL — ABNORMAL HIGH (ref 0.3–1.2)
Total Protein: 6.6 g/dL (ref 6.5–8.1)

## 2015-05-02 LAB — GLUCOSE, CAPILLARY: GLUCOSE-CAPILLARY: 91 mg/dL (ref 65–99)

## 2015-05-02 LAB — DIFFERENTIAL
BASOS PCT: 1 %
Basophils Absolute: 0 10*3/uL (ref 0.0–0.1)
Eosinophils Absolute: 0.4 10*3/uL (ref 0.0–0.7)
Eosinophils Relative: 5 %
LYMPHS ABS: 1 10*3/uL (ref 0.7–4.0)
LYMPHS PCT: 15 %
MONO ABS: 0.8 10*3/uL (ref 0.1–1.0)
MONOS PCT: 11 %
NEUTROS ABS: 4.8 10*3/uL (ref 1.7–7.7)
Neutrophils Relative %: 68 %

## 2015-05-02 LAB — CREATININE, SERUM
Creatinine, Ser: 2.15 mg/dL — ABNORMAL HIGH (ref 0.44–1.00)
GFR calc non Af Amer: 21 mL/min — ABNORMAL LOW (ref >60)
GFR, EST AFRICAN AMERICAN: 24 mL/min — AB (ref >60)

## 2015-05-02 LAB — TSH: TSH: 2.407 u[IU]/mL (ref 0.350–4.500)

## 2015-05-02 LAB — PROTIME-INR
INR: 1.22 (ref 0.00–1.49)
PROTHROMBIN TIME: 15.1 s (ref 11.6–15.2)

## 2015-05-02 LAB — I-STAT CG4 LACTIC ACID, ED: LACTIC ACID, VENOUS: 0.92 mmol/L (ref 0.5–2.0)

## 2015-05-02 LAB — URINE MICROSCOPIC-ADD ON
RBC / HPF: NONE SEEN RBC/hpf (ref 0–5)
Squamous Epithelial / LPF: NONE SEEN

## 2015-05-02 LAB — PHOSPHORUS: Phosphorus: 3.2 mg/dL (ref 2.5–4.6)

## 2015-05-02 LAB — MRSA PCR SCREENING: MRSA by PCR: NEGATIVE

## 2015-05-02 LAB — APTT: aPTT: 29 seconds (ref 24–37)

## 2015-05-02 MED ORDER — INSULIN ASPART 100 UNIT/ML ~~LOC~~ SOLN
0.0000 [IU] | Freq: Three times a day (TID) | SUBCUTANEOUS | Status: DC
  Administered 2015-05-03 (×2): 2 [IU] via SUBCUTANEOUS
  Administered 2015-05-04: 1 [IU] via SUBCUTANEOUS
  Administered 2015-05-04: 3 [IU] via SUBCUTANEOUS

## 2015-05-02 MED ORDER — RISAQUAD PO CAPS
1.0000 | ORAL_CAPSULE | Freq: Every day | ORAL | Status: DC
  Administered 2015-05-03 – 2015-05-05 (×3): 1 via ORAL
  Filled 2015-05-02 (×4): qty 1

## 2015-05-02 MED ORDER — ONDANSETRON HCL 4 MG PO TABS
4.0000 mg | ORAL_TABLET | Freq: Four times a day (QID) | ORAL | Status: DC | PRN
Start: 2015-05-02 — End: 2015-05-04

## 2015-05-02 MED ORDER — IPRATROPIUM-ALBUTEROL 0.5-2.5 (3) MG/3ML IN SOLN
3.0000 mL | Freq: Four times a day (QID) | RESPIRATORY_TRACT | Status: DC
  Administered 2015-05-02: 3 mL via RESPIRATORY_TRACT

## 2015-05-02 MED ORDER — URSODIOL 300 MG PO CAPS
300.0000 mg | ORAL_CAPSULE | Freq: Two times a day (BID) | ORAL | Status: DC
  Administered 2015-05-02 – 2015-05-05 (×6): 300 mg via ORAL
  Filled 2015-05-02 (×8): qty 1

## 2015-05-02 MED ORDER — FAMOTIDINE 20 MG PO TABS
20.0000 mg | ORAL_TABLET | Freq: Two times a day (BID) | ORAL | Status: DC
  Administered 2015-05-02: 20 mg via ORAL
  Filled 2015-05-02 (×2): qty 1

## 2015-05-02 MED ORDER — BUPROPION HCL ER (XL) 300 MG PO TB24
450.0000 mg | ORAL_TABLET | Freq: Every day | ORAL | Status: DC
  Administered 2015-05-03 – 2015-05-05 (×3): 450 mg via ORAL
  Filled 2015-05-02 (×3): qty 1

## 2015-05-02 MED ORDER — BUPROPION HCL ER (XL) 450 MG PO TB24: 450.0000 mg | ORAL_TABLET | Freq: Every day | ORAL | Status: DC

## 2015-05-02 MED ORDER — SENNOSIDES-DOCUSATE SODIUM 8.6-50 MG PO TABS
2.0000 | ORAL_TABLET | Freq: Every day | ORAL | Status: DC | PRN
Start: 2015-05-02 — End: 2015-05-05

## 2015-05-02 MED ORDER — SODIUM CHLORIDE 0.9 % IV BOLUS (SEPSIS)
1000.0000 mL | INTRAVENOUS | Status: DC
Start: 2015-05-02 — End: 2015-05-04
  Administered 2015-05-02 (×2): 1000 mL via INTRAVENOUS

## 2015-05-02 MED ORDER — ALBUTEROL SULFATE (2.5 MG/3ML) 0.083% IN NEBU: 2.5000 mg | INHALATION_SOLUTION | RESPIRATORY_TRACT | Status: DC | PRN

## 2015-05-02 MED ORDER — ENOXAPARIN SODIUM 30 MG/0.3ML ~~LOC~~ SOLN
30.0000 mg | SUBCUTANEOUS | Status: DC
  Administered 2015-05-02 – 2015-05-04 (×3): 30 mg via SUBCUTANEOUS
  Filled 2015-05-02 (×4): qty 0.3

## 2015-05-02 MED ORDER — VANCOMYCIN HCL IN DEXTROSE 1-5 GM/200ML-% IV SOLN
1000.0000 mg | INTRAVENOUS | Status: DC
  Administered 2015-05-03: 1000 mg via INTRAVENOUS
  Filled 2015-05-02: qty 200

## 2015-05-02 MED ORDER — INSULIN DETEMIR 100 UNIT/ML ~~LOC~~ SOLN
10.0000 [IU] | Freq: Two times a day (BID) | SUBCUTANEOUS | Status: DC
  Administered 2015-05-03 – 2015-05-04 (×2): 10 [IU] via SUBCUTANEOUS
  Filled 2015-05-02 (×3): qty 0.1

## 2015-05-02 MED ORDER — CEFEPIME HCL 2 G IJ SOLR
2.0000 g | Freq: Once | INTRAMUSCULAR | Status: AC
  Administered 2015-05-02: 2 g via INTRAVENOUS
  Filled 2015-05-02: qty 2

## 2015-05-02 MED ORDER — ACETAMINOPHEN 500 MG PO TABS
500.0000 mg | ORAL_TABLET | Freq: Four times a day (QID) | ORAL | Status: DC | PRN
  Administered 2015-05-03: 500 mg via ORAL
  Filled 2015-05-02: qty 1

## 2015-05-02 MED ORDER — PRAVASTATIN SODIUM 20 MG PO TABS
20.0000 mg | ORAL_TABLET | Freq: Every day | ORAL | Status: DC
  Administered 2015-05-03 – 2015-05-04 (×2): 20 mg via ORAL
  Filled 2015-05-02 (×3): qty 1

## 2015-05-02 MED ORDER — SODIUM CHLORIDE 0.9 % IV SOLN
INTRAVENOUS | Status: DC
  Administered 2015-05-02: 21:00:00 via INTRAVENOUS

## 2015-05-02 MED ORDER — VANCOMYCIN HCL IN DEXTROSE 1-5 GM/200ML-% IV SOLN
1000.0000 mg | Freq: Once | INTRAVENOUS | Status: AC
  Administered 2015-05-02: 1000 mg via INTRAVENOUS
  Filled 2015-05-02: qty 200

## 2015-05-02 MED ORDER — VITAMIN D3 25 MCG (1000 UNIT) PO TABS
2000.0000 [IU] | ORAL_TABLET | Freq: Every day | ORAL | Status: DC
  Administered 2015-05-03 – 2015-05-05 (×3): 2000 [IU] via ORAL
  Filled 2015-05-02 (×3): qty 2

## 2015-05-02 MED ORDER — POTASSIUM CHLORIDE CRYS ER 20 MEQ PO TBCR
40.0000 meq | EXTENDED_RELEASE_TABLET | Freq: Every day | ORAL | Status: DC
  Administered 2015-05-03: 40 meq via ORAL
  Filled 2015-05-02: qty 2

## 2015-05-02 MED ORDER — CARVEDILOL 6.25 MG PO TABS
6.2500 mg | ORAL_TABLET | Freq: Two times a day (BID) | ORAL | Status: DC
  Administered 2015-05-03 – 2015-05-05 (×5): 6.25 mg via ORAL
  Filled 2015-05-02 (×7): qty 1

## 2015-05-02 MED ORDER — GUAIFENESIN-DM 100-10 MG/5ML PO SYRP: 10.0000 mL | ORAL_SOLUTION | Freq: Four times a day (QID) | ORAL | Status: DC | PRN

## 2015-05-02 MED ORDER — GABAPENTIN 300 MG PO CAPS
300.0000 mg | ORAL_CAPSULE | Freq: Every day | ORAL | Status: DC
  Administered 2015-05-02 – 2015-05-04 (×3): 300 mg via ORAL
  Filled 2015-05-02 (×4): qty 1

## 2015-05-02 MED ORDER — IPRATROPIUM-ALBUTEROL 0.5-2.5 (3) MG/3ML IN SOLN
3.0000 mL | Freq: Three times a day (TID) | RESPIRATORY_TRACT | Status: DC
Start: 2015-05-03 — End: 2015-05-05
  Administered 2015-05-03 – 2015-05-05 (×6): 3 mL via RESPIRATORY_TRACT
  Filled 2015-05-02 (×6): qty 3

## 2015-05-02 MED ORDER — ONDANSETRON HCL 4 MG/2ML IJ SOLN: 4.0000 mg | Freq: Four times a day (QID) | INTRAMUSCULAR | Status: DC | PRN

## 2015-05-02 MED ORDER — ASPIRIN EC 325 MG PO TBEC
325.0000 mg | DELAYED_RELEASE_TABLET | Freq: Two times a day (BID) | ORAL | Status: DC
  Administered 2015-05-03: 325 mg via ORAL
  Filled 2015-05-02: qty 1

## 2015-05-02 MED ORDER — IPRATROPIUM-ALBUTEROL 0.5-2.5 (3) MG/3ML IN SOLN
RESPIRATORY_TRACT | Status: AC
  Filled 2015-05-02: qty 3

## 2015-05-02 MED ORDER — ONDANSETRON HCL 4 MG PO TABS: 4.0000 mg | ORAL_TABLET | Freq: Four times a day (QID) | ORAL | Status: DC | PRN

## 2015-05-02 MED ORDER — DEXTROSE 50 % IV SOLN
1.0000 | Freq: Once | INTRAVENOUS | Status: AC
  Administered 2015-05-02: 50 mL via INTRAVENOUS
  Filled 2015-05-02: qty 50

## 2015-05-02 MED ORDER — DEXTROSE 5 % IV SOLN
1.0000 g | INTRAVENOUS | Status: DC
  Administered 2015-05-03: 1 g via INTRAVENOUS
  Filled 2015-05-02: qty 1

## 2015-05-02 MED ORDER — ALBUTEROL SULFATE (2.5 MG/3ML) 0.083% IN NEBU
5.0000 mg | INHALATION_SOLUTION | Freq: Once | RESPIRATORY_TRACT | Status: AC
  Administered 2015-05-02: 5 mg via RESPIRATORY_TRACT
  Filled 2015-05-02: qty 6

## 2015-05-02 NOTE — ED Notes (Signed)
Bed: [id number] Expected date:  Expected time:  Means of arrival:  Comments: 82 yo Sepsis

## 2015-05-02 NOTE — H&P (Signed)
[facility] History and Physical  Anaelle Vater [id number] DOB: 03/10/1933 DOA: 05/02/2015  Referring physician: Lacretia Leigh, M.D. PCP: Curly Rim, MD   Chief Complaint: Altered mental status.  HPI: Tamara Stephenson is a 82 y.o. female with a past medical history of chronic back pain, hypertension, currently a stenosis, type 2 diabetes, diabetic neuropathy, chronic kidney disease secondary to diabetic nephropathy, depression, obesity, who is being treated for cellulitis of both lower legs and is brought to the emergency department via EMS due to altered mental status.  The patient's mental status has gradually improved, but she is unable to provide further details to her history of present illness. Workup in the emergency department with worsening renal function, anemia and a chest radiograph showing a moderate to large right pleural effusion with diffuse right lung airspace disease.    Review of Systems:  Unable to fully review due to the patient's mental status.  Past Medical History  Diagnosis Date  . Back pain   . Hypertension   . Carotid stenosis   . Colitis   . Diabetes mellitus without complication [facility])    Past Surgical History  Procedure Laterality Date  . Carotid endarterectomy    . Lumbar laminectomy for epidural abscess N/A 10/16/2013    Procedure: LUMBAR LAMINECTOMY FOR EPIDURAL ABSCESS Lumbar Two through Four;  Surgeon: Charlie Pitter, MD;  Location: [place] [facility];  Service: Neurosurgery;  Laterality: N/A;  . Hip arthroplasty Right 01/08/2015    Procedure: RIGHT ANTERIOR APPROACH HEMI HIP ARTHROPLASTY;  Surgeon: Rod Can, MD;  Location: [facility];  Service: Orthopedics;  Laterality: Right;   Social History:  reports that she has been smoking.  She does not have any smokeless tobacco history on file. She reports that she does not drink alcohol or use illicit drugs.  Allergies  Allergen Reactions  . Peanut-Containing Drug Products Nausea And Vomiting     Family History  Problem Relation Age of Onset  . Leukemia Mother   . Jaundice Father     Prior to Admission medications   Medication Sig Start Date End Date Taking? Authorizing Provider  acetaminophen (TYLENOL) 500 MG tablet Take 500 mg by mouth every 6 (six) hours as needed for moderate pain.    Yes Historical Provider, MD  acidophilus (RISAQUAD) CAPS capsule Take 1 capsule by mouth daily. 12/30/14  Yes Florencia Reasons, MD  aspirin EC 325 MG EC tablet Take 1 tablet (325 mg total) by mouth 2 (two) times daily after a meal. 01/09/15  Yes Theodis Blaze, MD  BuPROPion HCl ER, XL, 450 MG TB24 Take 450 mg by mouth daily.   Yes Historical Provider, MD  carvedilol (COREG) 6.25 MG tablet Take 1 tablet (6.25 mg total) by mouth 2 (two) times daily with a meal. 12/30/14  Yes Florencia Reasons, MD  cephALEXin (KEFLEX) 500 MG capsule Take 1 capsule (500 mg total) by mouth 2 (two) times daily. 12/30/14  Yes Florencia Reasons, MD  cholecalciferol (VITAMIN D) 1000 UNITS tablet Take 2,000 Units by mouth daily.    Yes Historical Provider, MD  furosemide (LASIX) 40 MG tablet Take 1 tablet (40 mg total) by mouth daily. 12/30/14  Yes Florencia Reasons, MD  gabapentin (NEURONTIN) 300 MG capsule Take 1 capsule (300 mg total) by mouth at bedtime. 10/21/13  Yes Marianne L York, PA-C  guaiFENesin-dextromethorphan (ROBITUSSIN DM) 100-10 MG/5ML syrup Take 10 mLs by mouth every 6 (six) hours as needed for cough.   Yes Historical Provider, MD  insulin detemir (  LEVEMIR) 100 UNIT/ML injection Inject 0.08 mLs (8 Units total) into the skin at bedtime. Patient taking differently: Inject 10 Units into the skin 2 (two) times daily.  12/30/14  Yes Florencia Reasons, MD  liver oil-zinc oxide (DESITIN) 40 % ointment Apply 1 application topically as needed for irritation.   Yes Historical Provider, MD  lovastatin (MEVACOR) 20 MG tablet Take 20 mg by mouth at bedtime.   Yes Historical Provider, MD  ondansetron (ZOFRAN) 4 MG tablet Take 4 mg by mouth every 6 (six) hours as needed  for nausea or vomiting.   Yes Historical Provider, MD  potassium chloride SA (K-DUR,KLOR-CON) 20 MEQ tablet Take 2 tablets (40 mEq total) by mouth daily. 12/30/14  Yes Florencia Reasons, MD  ranitidine (ZANTAC) 300 MG tablet Take 300 mg by mouth daily as needed for heartburn.    Yes Historical Provider, MD  senna-docusate (SENOKOT S) 8.6-50 MG tablet Take 2 tablets by mouth daily as needed for mild constipation.   Yes Historical Provider, MD  ursodiol (ACTIGALL) 300 MG capsule Take 300 mg by mouth 2 (two) times daily.    Yes Historical Provider, MD  HYDROcodone-acetaminophen (NORCO/VICODIN) 5-325 MG tablet Take 1-2 tablets by mouth every 6 (six) hours as needed for moderate pain. Patient not taking: Reported on 05/02/2015 01/09/15   Theodis Blaze, MD  insulin aspart (NOVOLOG) 100 UNIT/ML injection Inject 3 Units into the skin 3 (three) times daily with meals. Patient not taking: Reported on 05/02/2015 10/21/13   Melton Alar, PA-C  lactulose (CHRONULAC) 10 GM/15ML solution Take 15 mLs (10 g total) by mouth 2 (two) times daily. Patient not taking: Reported on 01/06/2015 12/30/14   Florencia Reasons, MD   Physical Exam: Filed Vitals:   05/02/15 1800 05/02/15 1900 05/02/15 1926 05/02/15 2008  BP:  135/65  141/75  Pulse:  69  72  Temp:    97.1 F (36.2 C)  TempSrc:    Oral  Resp:  18  18  Weight: 85.276 kg (188 lb)  85.276 kg (188 lb)   SpO2:  95%  97%    Wt Readings from Last 3 Encounters:  05/02/15 85.276 kg (188 lb)  01/10/15 85.4 kg (188 lb 4.4 oz)  12/26/14 87 kg (191 lb 12.8 oz)    General: Looks chronically ill, but in no acute distress. Eyes: PERRL, normal lids, irises & conjunctiva ENT: grossly normal hearing, lips & tongue. Oral mucosa is very dry Neck: no LAD, masses or thyromegaly Cardiovascular: RRR, no m/r/g. No LE edema. Telemetry: SR, no arrhythmias  Respiratory: Decreased breath sounds on both bases right > left, bilateral rales and rhonchi.                       No accessory muscle  use. Abdomen: soft, ntnd Skin: Multiple ecchymosis areas on upper extremities and on left shoulder area. Musculoskeletal: Bilateral lower extremities dressing on both feet and lower pretibial area. Psychiatric: grossly normal mood and affect, speech fluent and appropriate Neurologic: Awake, alert, oriented 2, partially oriented to time, moves all extremities.           Labs on Admission:  Basic Metabolic Panel:  Recent Labs Lab 05/02/15 1743  NA 145  K 3.8  CL 116*  CO2 21*  GLUCOSE 55*  BUN 39*  CREATININE 2.09*  CALCIUM 8.4*   Liver Function Tests:  Recent Labs Lab 05/02/15 1743  AST 36  ALT 18  ALKPHOS 187*  BILITOT 2.5*  PROT  6.6  ALBUMIN 1.9*   CBC:  Recent Labs Lab 05/02/15 1743  WBC 7.1  HGB 9.4*  HCT 26.7*  MCV 103.1*  PLT 225    Radiological Exams on Admission: Dg Chest Port 1 View  05/02/2015  CLINICAL DATA:  Altered mental status, cough for 2 days. EXAM: PORTABLE CHEST 1 VIEW COMPARISON:  01/06/2015 FINDINGS: Moderate to large right pleural effusion with diffuse right lung airspace disease, most confluent in the right lung base. No confluent opacity on the left. Heart is borderline in size. No acute bony abnormality. IMPRESSION: Moderate to large right pleural effusion with diffuse right lung airspace disease concerning for pneumonia. Electronically Signed   By: Rolm Baptise M.D.   On: 05/02/2015 17:55  Echocardiogram 12/27/2014 ------------------------------------------------------------------- LV EF: 35% -  40%  ------------------------------------------------------------------- Indications:   Syncope 780.2. Anemia.  ------------------------------------------------------------------- History:  PMH:  Syncope. Risk factors: Diabetes mellitus.  ------------------------------------------------------------------- Study Conclusions  - Left ventricle: The cavity size was normal. Wall thickness was increased in a pattern of mild LVH.  Systolic function was moderately reduced. The estimated ejection fraction was in the range of 35% to 40%. Wall motion was normal; there were no regional wall motion abnormalities. - Aortic valve: Mildly calcified annulus. Moderately thickened, moderately calcified leaflets. There was mild stenosis. Valve area (Vmax): 1.61 cm^2. - Mitral valve: Mildly calcified annulus. - Left atrium: The atrium was mildly dilated. - Right atrium: The atrium was mildly dilated.  EKG: Independently reviewed. Vent. rate 62 BPM PR interval 148 ms QRS duration 125 ms QT/QTc 446/453 ms P-R-T axes 60 17 40 Sinus rhythm Nonspecific intraventricular conduction delay  Assessment/Plan Principal Problem:   HCAP (healthcare-associated pneumonia) Admit to telemetry. Continue supplemental oxygen. Continue bronchodilators as needed. Continue cefepime and vancomycin per pharmacy consult. Check a sputum Gram stain, culture and sensitivity. Follow-up blood cultures and sensitivity.  Active Problems:    Cellulitis of lower extremities. Continue local care. Discontinue cephalexin. Cefepime and vancomycin should cover treatment for this.    CKD (chronic kidney disease) stage 3, GFR 30-59 ml/min   Diabetic nephropathy (HCC) Monitor BUN/creatinine and electrolytes.    Anemia of chronic disease Monitor hematocrit and hemoglobin periodically.    Diabetes mellitus with renal manifestations, uncontrolled ([place]) Patient was hypoglycemic earlier. I will hold Levemir for tonight. CBG monitoring with regular insulin sensitive sliding scale.      Dehydration Gentle IV hydration for 20 hours.    CHF (congestive heart failure) (HCC) No signs of decompensation, actually the patient looks very dehydrated. Continue walker and hold furosemide.      Depression Continue bupropion.    Code Status: Full code. DVT Prophylaxis: Lovenox SQ. Family Communication:  Disposition Plan: Admit to telemetry with IV  antibiotics for a few days.  Time spent: About 70 minutes were spent in the process of this admission.  Reubin Milan, M.D. [facility] Pager [phone].

## 2015-05-02 NOTE — Progress Notes (Signed)
Pharmacy Antibiotic Note  Tamara Stephenson is a 82 y.o. female presented to the ED from [facility] on 05/02/2015 with c/o cough.  Of note, CXR at facility on 2/24 showed PNA .  Pharmacy has been consulted for vancomycin and cefepime dosing for PNA.  Plan: - vancomycin 1gm IV q24h - cefepime 2gm IV x1 given in ED, then 1 gm IV q24h - f/u renal function  No data recorded.  No results for input(s): WBC, CREATININE, LATICACIDVEN, VANCOTROUGH, VANCOPEAK, VANCORANDOM, GENTTROUGH, GENTPEAK, GENTRANDOM, TOBRATROUGH, TOBRAPEAK, TOBRARND, AMIKACINPEAK, AMIKACINTROU, AMIKACIN in the last 168 hours.  CrCl cannot be calculated (Unknown ideal weight.).    Allergies  Allergen Reactions  . Peanut-Containing Drug Products Nausea And Vomiting   Antimicrobials this admission: 2/25 vanc>> 2/25 cefepime>>  Levels/dose changes this admission: n/a  Microbiology results: 2/25 bcx x2:  Thank you for allowing pharmacy to be a part of this patient's care.  Lynelle Doctor 05/02/2015 5:29 PM

## 2015-05-02 NOTE — ED Notes (Addendum)
Per EMS, Pt from [facility] on [address]. Pt was dx with cellulitis in her L leg and has been on Keflex x 6 days. Pt had a cough yesterday and they did an xray that showed R sided PNA. Today, pt presents with AMS. No hx of dementia. Pt was found incontinent of feces. Pt will answer some questions. Pt responded that it was 1976 when asked what year it was. This is not baseline, per facility. Pt has had 2 albuterol treatments.

## 2015-05-02 NOTE — ED Provider Notes (Signed)
CSN: [id number]     Arrival date & time 05/02/15  1714 History   First MD Initiated Contact with Patient 05/02/15 1725     Chief Complaint  Patient presents with  . Altered Mental Status     (Consider location/radiation/quality/duration/timing/severity/associated sxs/prior Treatment) HPI Comments: Patient here with decreased level of consciousness as well as pneumonia diagnosed at the nursing home. Is currently being treated for cellulitis has been on Keflex and patient developed a cough which an outpatient x-rays showed a right-sided pneumonia. She has had worsening weakness which is at her baseline. Not as responsive. Appears of confusion which seemed to wax and wane. EMS called and patient transported here  Patient is a 82 y.o. female presenting with altered mental status. The history is provided by the patient and the EMS personnel. The history is limited by the condition of the patient.  Altered Mental Status   Past Medical History  Diagnosis Date  . Back pain   . Hypertension   . Carotid stenosis   . Colitis   . Diabetes mellitus without complication [facility])    Past Surgical History  Procedure Laterality Date  . Carotid endarterectomy    . Lumbar laminectomy for epidural abscess N/A 10/16/2013    Procedure: LUMBAR LAMINECTOMY FOR EPIDURAL ABSCESS Lumbar Two through Four;  Surgeon: Charlie Pitter, MD;  Location: [place] [facility];  Service: Neurosurgery;  Laterality: N/A;  . Hip arthroplasty Right 01/08/2015    Procedure: RIGHT ANTERIOR APPROACH HEMI HIP ARTHROPLASTY;  Surgeon: Rod Can, MD;  Location: [facility];  Service: Orthopedics;  Laterality: Right;   Family History  Problem Relation Age of Onset  . Leukemia Mother   . Jaundice Father    Social History  Substance Use Topics  . Smoking status: Current Every Day Smoker -- 0.20 packs/day  . Smokeless tobacco: None  . Alcohol Use: No   OB History    No data available     Review of Systems  Unable to perform ROS: Acuity  of condition      Allergies  Peanut-containing drug products  Home Medications   Prior to Admission medications   Medication Sig Start Date End Date Taking? Authorizing Provider  acetaminophen (TYLENOL) 500 MG tablet Take 500 mg by mouth every 6 (six) hours as needed for moderate pain.     Historical Provider, MD  acidophilus (RISAQUAD) CAPS capsule Take 1 capsule by mouth daily. Patient not taking: Reported on 01/06/2015 12/30/14   Florencia Reasons, MD  aspirin EC 325 MG EC tablet Take 1 tablet (325 mg total) by mouth 2 (two) times daily after a meal. 01/09/15   Theodis Blaze, MD  buPROPion (WELLBUTRIN XL) 300 MG 24 hr tablet Take 300 mg by mouth daily.    Historical Provider, MD  carvedilol (COREG) 6.25 MG tablet Take 1 tablet (6.25 mg total) by mouth 2 (two) times daily with a meal. Patient not taking: Reported on 01/06/2015 12/30/14   Florencia Reasons, MD  cephALEXin (KEFLEX) 500 MG capsule Take 1 capsule (500 mg total) by mouth 2 (two) times daily. Patient not taking: Reported on 01/06/2015 12/30/14   Florencia Reasons, MD  cholecalciferol (VITAMIN D) 1000 UNITS tablet Take 2,000 Units by mouth daily.     Historical Provider, MD  Cyanocobalamin (VITAMIN B 12) 100 MCG LOZG Take 1 tablet by mouth every other day.     Historical Provider, MD  furosemide (LASIX) 40 MG tablet Take 1 tablet (40 mg total) by mouth daily. Patient not  taking: Reported on 01/06/2015 12/30/14   Florencia Reasons, MD  gabapentin (NEURONTIN) 300 MG capsule Take 1 capsule (300 mg total) by mouth at bedtime. 10/21/13   Melton Alar, PA-C  HYDROcodone-acetaminophen (NORCO/VICODIN) 5-325 MG tablet Take 1-2 tablets by mouth every 6 (six) hours as needed for moderate pain. 01/09/15   Theodis Blaze, MD  insulin aspart (NOVOLOG) 100 UNIT/ML injection Inject 3 Units into the skin 3 (three) times daily with meals. 10/21/13   Bobby Rumpf York, PA-C  insulin detemir (LEVEMIR) 100 UNIT/ML injection Inject 0.08 mLs (8 Units total) into the skin at bedtime. 12/30/14    Florencia Reasons, MD  lactulose (CHRONULAC) 10 GM/15ML solution Take 15 mLs (10 g total) by mouth 2 (two) times daily. Patient not taking: Reported on 01/06/2015 12/30/14   Florencia Reasons, MD  lovastatin (MEVACOR) 20 MG tablet Take 20 mg by mouth at bedtime.    Historical Provider, MD  potassium chloride SA (K-DUR,KLOR-CON) 20 MEQ tablet Take 2 tablets (40 mEq total) by mouth daily. Patient not taking: Reported on 01/06/2015 12/30/14   Florencia Reasons, MD  ranitidine (ZANTAC) 300 MG tablet Take 300 mg by mouth 3 times/day as needed-between meals & bedtime for heartburn.     Historical Provider, MD  ursodiol (ACTIGALL) 300 MG capsule Take 300 mg by mouth 3 (three) times daily.    Historical Provider, MD   SpO2 98% Physical Exam  Constitutional: She is oriented to person, place, and time. She appears well-developed and well-nourished. She appears lethargic.  Non-toxic appearance. No distress.  HENT:  Head: Normocephalic and atraumatic.  Eyes: Conjunctivae, EOM and lids are normal. Pupils are equal, round, and reactive to light.  Neck: Normal range of motion and full passive range of motion without pain. Neck supple. No tracheal deviation present. No thyroid mass present.  Cardiovascular: Normal rate, regular rhythm and normal heart sounds.  Exam reveals no gallop.   No murmur heard. Pulmonary/Chest: Effort normal. No stridor. No respiratory distress. She has decreased breath sounds. She has wheezes. She has no rhonchi. She has no rales.  Abdominal: Soft. Normal appearance and bowel sounds are normal. She exhibits no distension. There is no tenderness. There is no rebound and no CVA tenderness.  Musculoskeletal: Normal range of motion. She exhibits no edema or tenderness.  Bilateral lower extremities wrapped in [place] dressing  Neurological: She is oriented to person, place, and time. She has normal strength. She appears lethargic. No cranial nerve deficit or sensory deficit. GCS eye subscore is 4. GCS verbal subscore is 5.  GCS motor subscore is 6.  Skin: Skin is warm and dry. No abrasion and no rash noted.  Psychiatric: Her affect is blunt. Her speech is delayed. She is slowed.  Nursing note and vitals reviewed.   ED Course  Procedures (including critical care time) Labs Review Labs Reviewed  CULTURE, BLOOD (ROUTINE X 2)  CULTURE, BLOOD (ROUTINE X 2)  URINE CULTURE  COMPREHENSIVE METABOLIC PANEL  URINALYSIS, ROUTINE W REFLEX MICROSCOPIC (NOT AT [facility])  CBC  I-STAT CG4 LACTIC ACID, ED  I-STAT CG4 LACTIC ACID, ED    Imaging Review No results found. I have personally reviewed and evaluated these images and lab results as part of my medical decision-making.   EKG Interpretation   Date/Time:  Saturday May 02 2015 17:44:45 EST Ventricular Rate:  62 PR Interval:  148 QRS Duration: 125 QT Interval:  446 QTC Calculation: 453 R Axis:   17 Text Interpretation:  Sinus rhythm Nonspecific intraventricular  conduction  delay Confirmed by Kristell Wooding  MD, Vida Nicol ([id number]) on 05/02/2015 7:32:30 PM      MDM  Sepsis protocol initiated. Chest x-ray showed pneumonia. Patient started on IV antibiotics. Patient's hyperglycemia treated with IV dextrose. Patient will be admitted by the hospitalist  CRITICAL CARE Performed by: Leota Jacobsen Total critical care time: 50 minutes Critical care time was exclusive of separately billable procedures and treating other patients. Critical care was necessary to treat or prevent imminent or life-threatening deterioration. Critical care was time spent personally by me on the following activities: development of treatment plan with patient and/or surrogate as well as nursing, discussions with consultants, evaluation of patient's response to treatment, examination of patient, obtaining history from patient or surrogate, ordering and performing treatments and interventions, ordering and review of laboratory studies, ordering and review of radiographic studies, pulse oximetry and  re-evaluation of patient's condition.     Lacretia Leigh, MD 05/02/15 [phone]

## 2015-05-03 DIAGNOSIS — E0821 Diabetes mellitus due to underlying condition with diabetic nephropathy: Secondary | ICD-10-CM

## 2015-05-03 DIAGNOSIS — J189 Pneumonia, unspecified organism: Principal | ICD-10-CM

## 2015-05-03 DIAGNOSIS — N183 Chronic kidney disease, stage 3 (moderate): Secondary | ICD-10-CM

## 2015-05-03 DIAGNOSIS — D638 Anemia in other chronic diseases classified elsewhere: Secondary | ICD-10-CM

## 2015-05-03 DIAGNOSIS — L8990 Pressure ulcer of unspecified site, unspecified stage: Secondary | ICD-10-CM | POA: Insufficient documentation

## 2015-05-03 DIAGNOSIS — E0865 Diabetes mellitus due to underlying condition with hyperglycemia: Secondary | ICD-10-CM

## 2015-05-03 LAB — INFLUENZA PANEL BY PCR (TYPE A & B)
H1N1FLUPCR: NOT DETECTED
INFLBPCR: NEGATIVE
Influenza A By PCR: NEGATIVE

## 2015-05-03 LAB — CBC WITH DIFFERENTIAL/PLATELET
BASOS ABS: 0.1 10*3/uL (ref 0.0–0.1)
BASOS PCT: 1 %
EOS PCT: 6 %
Eosinophils Absolute: 0.3 10*3/uL (ref 0.0–0.7)
HCT: 28.6 % — ABNORMAL LOW (ref 36.0–46.0)
Hemoglobin: 9.4 g/dL — ABNORMAL LOW (ref 12.0–15.0)
LYMPHS PCT: 19 %
Lymphs Abs: 1.1 10*3/uL (ref 0.7–4.0)
MCH: 33 pg (ref 26.0–34.0)
MCHC: 32.9 g/dL (ref 30.0–36.0)
MCV: 100.4 fL — AB (ref 78.0–100.0)
MONO ABS: 0.6 10*3/uL (ref 0.1–1.0)
Monocytes Relative: 11 %
Neutro Abs: 3.7 10*3/uL (ref 1.7–7.7)
Neutrophils Relative %: 63 %
PLATELETS: 223 10*3/uL (ref 150–400)
RBC: 2.85 MIL/uL — ABNORMAL LOW (ref 3.87–5.11)
RDW: 19.3 % — AB (ref 11.5–15.5)
WBC: 5.8 10*3/uL (ref 4.0–10.5)

## 2015-05-03 LAB — GLUCOSE, CAPILLARY
Glucose-Capillary: 64 mg/dL — ABNORMAL LOW (ref 65–99)
Glucose-Capillary: 131 mg/dL — ABNORMAL HIGH (ref 65–99)
Glucose-Capillary: 179 mg/dL — ABNORMAL HIGH (ref 65–99)
Glucose-Capillary: 158 mg/dL — ABNORMAL HIGH (ref 65–99)

## 2015-05-03 LAB — COMPREHENSIVE METABOLIC PANEL
ALT: 18 U/L (ref 14–54)
ANION GAP: 6 (ref 5–15)
AST: 41 U/L (ref 15–41)
Albumin: 1.8 g/dL — ABNORMAL LOW (ref 3.5–5.0)
Alkaline Phosphatase: 179 U/L — ABNORMAL HIGH (ref 38–126)
BUN: 36 mg/dL — ABNORMAL HIGH (ref 6–20)
CHLORIDE: 114 mmol/L — AB (ref 101–111)
CO2: 22 mmol/L (ref 22–32)
Calcium: 8.2 mg/dL — ABNORMAL LOW (ref 8.9–10.3)
Creatinine, Ser: 2.01 mg/dL — ABNORMAL HIGH (ref 0.44–1.00)
GFR, EST AFRICAN AMERICAN: 26 mL/min — AB (ref >60)
GFR, EST NON AFRICAN AMERICAN: 22 mL/min — AB (ref >60)
Glucose, Bld: 87 mg/dL (ref 65–99)
POTASSIUM: 4 mmol/L (ref 3.5–5.1)
SODIUM: 142 mmol/L (ref 135–145)
Total Bilirubin: 2.4 mg/dL — ABNORMAL HIGH (ref 0.3–1.2)
Total Protein: 6 g/dL — ABNORMAL LOW (ref 6.5–8.1)

## 2015-05-03 LAB — STREP PNEUMONIAE URINARY ANTIGEN: STREP PNEUMO URINARY ANTIGEN: NEGATIVE

## 2015-05-03 MED ORDER — FAMOTIDINE 20 MG PO TABS
20.0000 mg | ORAL_TABLET | Freq: Every day | ORAL | Status: DC
  Administered 2015-05-03 – 2015-05-05 (×3): 20 mg via ORAL
  Filled 2015-05-03 (×2): qty 1

## 2015-05-03 MED ORDER — ASPIRIN EC 325 MG PO TBEC
325.0000 mg | DELAYED_RELEASE_TABLET | Freq: Every day | ORAL | Status: DC
  Administered 2015-05-04 – 2015-05-05 (×2): 325 mg via ORAL
  Filled 2015-05-03 (×2): qty 1

## 2015-05-03 NOTE — Consult Note (Signed)
WOC wound consult note Reason for Consult:left LE ulceration with cellulitis Wound type: Pressure, infectious Pressure Ulcer POA: Yes Measurement: Left medial heel with full thickness, deep tissue pressure injury measuring 3.5cm x 3cm of peeling epidermis revealing a red, moist wound bed with a 2.5cm x 2cm dark purple center.  This dark center could be an indication of an injury of greater depth and may evolve as time passes despite optimal care.  Left pretibial area with partial thickness tissue loss measuring 1.5cm x 0.5cm x 0.2cm.  Scant serous exudate from pink wound bed. Wound bed: As described above. Drainage (amount, consistency, odor) serosanguinous from heel in a small amount upon dry dressing removal.  No other exudate. No odor. Periwound:Intact, dry on right.  Erythematous in the gaiter (pretibial) area on the left. Dressing procedure/placement/frequency: I will no reinstate the compression short stretch (Unna's Boots) today, both LEs will benefit from daily or twice daily care and observation.  Conservative care orders are provided with guidance for Nursing staff via the orders.  Petrolatum gauze to the left heel twice daily with pressure redistribution boots bilaterally. A pressure redistribution chair pad for use when OOB as the sacrum and coccxy sustain pressure when the heels and LEs are elevated.  In a similar fashion, a prophylactic sacral foam dressing is suggested to prevent pressure injury. [place] nursing team will not follow, but will remain available to this patient, the nursing and medical teams.  Please re-consult if needed. Thanks, Maudie Flakes, MSN, RN, [place], Arther Abbott  Pager# [phone]

## 2015-05-03 NOTE — Progress Notes (Signed)
Patient with temp of 94.3 rectal. Patient alert and oriented x4. Warm blankets and heat packs applied.  NP on call notified. No new orders placed.   2335: Rectal temp 94.7. Patient in no acute distress. NP on call notified.  Rapid response called to assess patient. Patient A&O x4. Pt in no acute distress. Temp rechecked 95.0. BP 128/82, resp 21, HR 73, 100% 2 L [place]. Warm blankets and heat packs applied. No new orders placed.   Will continue to monitor patient closely.

## 2015-05-03 NOTE — Progress Notes (Signed)
PROGRESS NOTE  Tamara Stephenson [id number] DOB: March 25, 1933 DOA: 05/02/2015 PCP: Curly Rim, MD  Assessment/Plan: HCAP (healthcare-associated pneumonia) Continue supplemental oxygen. Continue bronchodilators as needed. Continue cefepime and vancomycin per pharmacy consult. Check a sputum Gram stain, culture and sensitivity. Follow-up blood cultures and sensitivity.   Cellulitis of lower extremities- reported Severe venous insufficiency Remove unna boots Cefepime and vancomycin should cover treatment for this. WOC   CKD (chronic kidney disease) stage 3, GFR 30-59 ml/min  Diabetic nephropathy (HCC) Monitor BUN/creatinine and electrolytes.   Anemia of chronic disease Monitor hematocrit and hemoglobin periodically.   Diabetes mellitus with renal manifestations, uncontrolled ([place]) Patient was hypoglycemic earlier.  hold Levemir CBG monitoring with regular insulin sensitive sliding scale.    Dehydration Gentle IV hydration for 20 hours.   CHF (congestive heart failure) (HCC) No signs of decompensation, actually the patient looks very dehydrated. Continue walker and hold furosemide.   Depression Continue bupropion.  PT eval   Code Status: listed as full-- will need to discuss with patient Family Communication:  Disposition Plan:    Consultants:    Procedures:     HPI/Subjective: Says she is moving from [place] to [place] tomm  Objective: Filed Vitals:   05/03/15 0455 05/03/15 0711  BP: 143/94   Pulse: 75   Temp: 96.5 F (35.8 C) 97.1 F (36.2 C)  Resp: 20     Intake/Output Summary (Last 24 hours) at 05/03/15 1458 Last data filed at 05/03/15 [id number]  Gross per 24 hour  Intake 580.83 ml  Output      0 ml  Net 580.83 ml   Filed Weights   05/02/15 1800 05/02/15 1926 05/02/15 2110  Weight: 85.276 kg (188 lb) 85.276 kg (188 lb) 85.4 kg (188 lb 4.4 oz)    Exam:   General:  Awake, pleasant  Cardiovascular: rrr  Respiratory:  clear  Abdomen: +BS, soft  Musculoskeletal: b/l Unna boots- remove by nursing  Data Reviewed: Basic Metabolic Panel:  Recent Labs Lab 05/02/15 1743 05/02/15 2052 05/03/15 0458  NA 145  --  142  K 3.8  --  4.0  CL 116*  --  114*  CO2 21*  --  22  GLUCOSE 55*  --  87  BUN 39*  --  36*  CREATININE 2.09* 2.15* 2.01*  CALCIUM 8.4*  --  8.2*  MG  --  1.9  --   PHOS  --  3.2  --    Liver Function Tests:  Recent Labs Lab 05/02/15 1743 05/03/15 0458  AST 36 41  ALT 18 18  ALKPHOS 187* 179*  BILITOT 2.5* 2.4*  PROT 6.6 6.0*  ALBUMIN 1.9* 1.8*   No results for input(s): LIPASE, AMYLASE in the last 168 hours. No results for input(s): AMMONIA in the last 168 hours. CBC:  Recent Labs Lab 05/02/15 1743 05/02/15 2052 05/03/15 0458  WBC 7.1 7.0 5.8  NEUTROABS  --  4.8 3.7  HGB 9.4* 9.4* 9.4*  HCT 26.7* 29.3* 28.6*  MCV 103.1* 100.7* 100.4*  PLT 225 222 223   Cardiac Enzymes: No results for input(s): CKTOTAL, CKMB, CKMBINDEX, TROPONINI in the last 168 hours. BNP (last 3 results) No results for input(s): BNP in the last 8760 hours.  ProBNP (last 3 results) No results for input(s): PROBNP in the last 8760 hours.  CBG:  Recent Labs Lab 05/02/15 2241 05/03/15 0752 05/03/15 0855 05/03/15 1203  GLUCAP 91 64* 131* 179*    Recent Results (from the past 240 hour(s))  MRSA  PCR Screening     Status: None   Collection Time: 05/02/15  8:57 PM  Result Value Ref Range Status   MRSA by PCR NEGATIVE NEGATIVE Final    Comment:        The GeneXpert MRSA Assay (FDA approved for NASAL specimens only), is one component of a comprehensive MRSA colonization surveillance program. It is not intended to diagnose MRSA infection nor to guide or monitor treatment for MRSA infections.      Studies: Dg Chest Port 1 View  05/02/2015  CLINICAL DATA:  Altered mental status, cough for 2 days. EXAM: PORTABLE CHEST 1 VIEW COMPARISON:  01/06/2015 FINDINGS: Moderate to large right  pleural effusion with diffuse right lung airspace disease, most confluent in the right lung base. No confluent opacity on the left. Heart is borderline in size. No acute bony abnormality. IMPRESSION: Moderate to large right pleural effusion with diffuse right lung airspace disease concerning for pneumonia. Electronically Signed   By: Rolm Baptise M.D.   On: 05/02/2015 17:55    Scheduled Meds: . acidophilus  1 capsule Oral Daily  . aspirin EC  325 mg Oral BID PC  . buPROPion  450 mg Oral Daily  . carvedilol  6.25 mg Oral BID WC  . ceFEPime (MAXIPIME) IV  1 g Intravenous Q24H  . cholecalciferol  2,000 Units Oral Daily  . enoxaparin (LOVENOX) injection  30 mg Subcutaneous Q24H  . famotidine  20 mg Oral Daily  . gabapentin  300 mg Oral QHS  . insulin aspart  0-9 Units Subcutaneous TID WC  . insulin detemir  10 Units Subcutaneous BID  . ipratropium-albuterol  3 mL Nebulization TID  . potassium chloride SA  40 mEq Oral Daily  . pravastatin  20 mg Oral q1800  . ursodiol  300 mg Oral BID  . vancomycin  1,000 mg Intravenous Q24H   Continuous Infusions: . sodium chloride 50 mL/hr at 05/02/15 2129   Antibiotics Given (last 72 hours)    None      Principal Problem:   HCAP (healthcare-associated pneumonia) Active Problems:   CKD (chronic kidney disease) stage 3, GFR 30-59 ml/min   Anemia of chronic disease   Diabetes mellitus with renal manifestations, uncontrolled (HCC)   Diabetic nephropathy (HCC)   Dehydration   CHF (congestive heart failure) ([place])   Cellulitis of both lower extremities   Depression   Pressure ulcer    Time spent: 35 min    [facility] Pager [phone]. If 7PM-7AM, please contact night-coverage at [email or web address], password [facility] 05/03/2015, 2:58 PM  LOS: 1 day

## 2015-05-03 NOTE — Progress Notes (Signed)
Hypoglycemic Event  CBG: 64  Treatment: 15 GM carbohydrate snack  Symptoms: Hungry  Follow-up CBG: [id number] CBG Result:131  Possible Reasons for Event: Unknown  Comments/MD notified:hypoglycemic protocol    Illa Level

## 2015-05-04 DIAGNOSIS — L03115 Cellulitis of right lower limb: Secondary | ICD-10-CM

## 2015-05-04 DIAGNOSIS — L03116 Cellulitis of left lower limb: Secondary | ICD-10-CM

## 2015-05-04 DIAGNOSIS — E860 Dehydration: Secondary | ICD-10-CM

## 2015-05-04 LAB — COMPREHENSIVE METABOLIC PANEL
ALT: 16 U/L (ref 14–54)
AST: 34 U/L (ref 15–41)
Albumin: 1.6 g/dL — ABNORMAL LOW (ref 3.5–5.0)
Alkaline Phosphatase: 164 U/L — ABNORMAL HIGH (ref 38–126)
Anion gap: 7 (ref 5–15)
BUN: 34 mg/dL — AB (ref 6–20)
CHLORIDE: 113 mmol/L — AB (ref 101–111)
CO2: 18 mmol/L — ABNORMAL LOW (ref 22–32)
Calcium: 8.1 mg/dL — ABNORMAL LOW (ref 8.9–10.3)
Creatinine, Ser: 1.96 mg/dL — ABNORMAL HIGH (ref 0.44–1.00)
GFR calc Af Amer: 27 mL/min — ABNORMAL LOW (ref >60)
GFR, EST NON AFRICAN AMERICAN: 23 mL/min — AB (ref >60)
Glucose, Bld: 114 mg/dL — ABNORMAL HIGH (ref 65–99)
POTASSIUM: 4.3 mmol/L (ref 3.5–5.1)
SODIUM: 138 mmol/L (ref 135–145)
Total Bilirubin: 2.1 mg/dL — ABNORMAL HIGH (ref 0.3–1.2)
Total Protein: 5.4 g/dL — ABNORMAL LOW (ref 6.5–8.1)

## 2015-05-04 LAB — CBC WITH DIFFERENTIAL/PLATELET
Basophils Absolute: 0.1 10*3/uL (ref 0.0–0.1)
Basophils Relative: 1 %
EOS PCT: 9 %
Eosinophils Absolute: 0.5 10*3/uL (ref 0.0–0.7)
HCT: 25.4 % — ABNORMAL LOW (ref 36.0–46.0)
HEMOGLOBIN: 8.3 g/dL — AB (ref 12.0–15.0)
LYMPHS ABS: 1.9 10*3/uL (ref 0.7–4.0)
LYMPHS PCT: 31 %
MCH: 32.7 pg (ref 26.0–34.0)
MCHC: 32.7 g/dL (ref 30.0–36.0)
MCV: 100 fL (ref 78.0–100.0)
Monocytes Absolute: 0.9 10*3/uL (ref 0.1–1.0)
Monocytes Relative: 15 %
NEUTROS PCT: 44 %
Neutro Abs: 2.8 10*3/uL (ref 1.7–7.7)
Platelets: 198 10*3/uL (ref 150–400)
RBC: 2.54 MIL/uL — AB (ref 3.87–5.11)
RDW: 19.3 % — ABNORMAL HIGH (ref 11.5–15.5)
WBC: 6.1 10*3/uL (ref 4.0–10.5)

## 2015-05-04 LAB — GLUCOSE, CAPILLARY
GLUCOSE-CAPILLARY: 131 mg/dL — AB (ref 65–99)
Glucose-Capillary: 167 mg/dL — ABNORMAL HIGH (ref 65–99)
Glucose-Capillary: 54 mg/dL — ABNORMAL LOW (ref 65–99)
Glucose-Capillary: 87 mg/dL (ref 65–99)
Glucose-Capillary: 202 mg/dL — ABNORMAL HIGH (ref 65–99)

## 2015-05-04 LAB — LEGIONELLA ANTIGEN, URINE

## 2015-05-04 MED ORDER — CEFUROXIME AXETIL 500 MG PO TABS
500.0000 mg | ORAL_TABLET | Freq: Every day | ORAL | Status: DC
  Administered 2015-05-04: 500 mg via ORAL
  Filled 2015-05-04 (×2): qty 1

## 2015-05-04 MED ORDER — INSULIN DETEMIR 100 UNIT/ML ~~LOC~~ SOLN
7.0000 [IU] | Freq: Two times a day (BID) | SUBCUTANEOUS | Status: DC
Start: 2015-05-04 — End: 2015-05-05
  Administered 2015-05-04 – 2015-05-05 (×3): 7 [IU] via SUBCUTANEOUS
  Filled 2015-05-04 (×3): qty 0.07

## 2015-05-04 NOTE — Evaluation (Signed)
Physical Therapy Evaluation Patient Details Name: Tamara Stephenson MRN: [id number] DOB: 10/19/1933 Today's Date: 05/04/2015   History of Present Illness  Tamara Stephenson is a 82 y.o. female with a past medical history of chronic back pain, hypertension, currently a stenosis, type 2 diabetes, diabetic neuropathy, chronic kidney disease secondary to diabetic nephropathy, depression, obesity, who is being treated for cellulitis of both lower legs and is brought to the emergency department via EMS due to altered mental status.  Clinical Impression  Pt admitted with above diagnosis. Pt currently with functional limitations due to the deficits listed below (see PT Problem List).  Pt will benefit from skilled PT to increase their independence and safety with mobility to allow discharge to the venue listed below.       Follow Up Recommendations Home health PT;Supervision - Intermittent    Equipment Recommendations  None recommended by PT    Recommendations for Other Services       Precautions / Restrictions Precautions Precautions: Fall Precaution Comments: pt has had multiple falls, most recent about 1wk ago per pt report      Mobility  Bed Mobility Overal bed mobility: Needs Assistance Bed Mobility: Supine to Sit     Supine to sit: Min assist     General bed mobility comments: min assist with trunk, incr time  Transfers Overall transfer level: Needs assistance Equipment used: Rolling walker (2 wheeled) Transfers: Sit to/from [facility] Sit to Stand: Min assist Stand pivot transfers: Min assist       General transfer comment: cues for hand placement and safety  Ambulation/Gait Ambulation/Gait assistance: Min assist Ambulation Distance (Feet): 5 Feet Assistive device: Rolling walker (2 wheeled) Gait Pattern/deviations: Trunk flexed;Wide base of support;Step-to pattern;Decreased step length - right;Decreased step length - left     General Gait Details: cues for RW  position and safety, assist for balance and to maneuver RW   Stairs            Wheelchair Mobility    Modified Rankin (Stroke Patients Only)       Balance Overall balance assessment: Needs assistance                                           Pertinent Vitals/Pain Pain Assessment: Faces Faces Pain Scale: Hurts worst Pain Location: pt screams when her socks are put on  Pain Intervention(s): Limited activity within patient's tolerance;Patient requesting pain meds-RN notified    Home Living Family/patient expects to be discharged to:: Private residence             Home Layout: One level [place]: Wheelchair - [employer] - 2 wheels Additional Comments: from [facility]--> going to [place] of [place]--pt states that is I-living    Prior Function Level of Independence: Independent with assistive device(s);Needs assistance   Gait / Transfers Assistance Needed: I transfers, pt reports she was non-ambulatory prior to adm per her report--she was fearful of falling  ADL's / Homemaking Assistance Needed: independent--although takes her a long time        Hand Dominance        Extremity/Trunk Assessment   Upper Extremity Assessment: Generalized weakness           Lower Extremity Assessment: Generalized weakness      Cervical / Trunk Assessment: Other exceptions  Communication      Cognition Arousal/Alertness: Awake/alert Behavior During Therapy: [facility] for  tasks assessed/performed   Area of Impairment: Attention;Problem solving   Current Attention Level: Sustained           General Comments: pt requires frequent redirection to task or to answer basic hx questions    General Comments      Exercises        Assessment/Plan    PT Assessment Patient needs continued PT services  PT Diagnosis Difficulty walking   PT Problem List Decreased strength;Decreased range of motion;Decreased activity tolerance;Decreased  mobility;Decreased balance;Decreased safety awareness  PT Treatment Interventions DME instruction;Gait training;Functional mobility training;Therapeutic activities;Therapeutic exercise;Patient/family education   PT Goals (Current goals can be found in the Care Plan section) Acute Rehab PT Goals Patient Stated Goal: home very soon PT Goal Formulation: With patient Time For Goal Achievement: 05/18/15 Potential to Achieve Goals: Good    Frequency Min 3X/week   Barriers to discharge        Co-evaluation               End of Session Equipment Utilized During Treatment: Gait belt Activity Tolerance: Patient tolerated treatment well Patient left: with call bell/phone within reach;in chair;with chair alarm set           Time: [id number] PT Time Calculation (min) (ACUTE ONLY): 35 min   Charges:   PT Evaluation $PT Eval Low Complexity: 1 Procedure     PT G Codes:        Tamara Stephenson 05-09-15, 1:25 PM

## 2015-05-04 NOTE — Progress Notes (Signed)
PROGRESS NOTE  Tamara Stephenson [id number] DOB: [birth year] DOA: 05/02/2015 PCP: Curly Rim, MD  Assessment/Plan: HCAP (healthcare-associated pneumonia) Wean O2 Continue bronchodilators as needed. Narrow abx Check a sputum Gram stain, culture and sensitivity. Follow-up blood cultures and sensitivity.   Cellulitis of lower extremities- reported Severe venous insufficiency Remove unna boots -wound care:Petrolatum gauze to the left heel twice daily with pressure redistribution boots bilaterally. A pressure redistribution chair pad for use when OOB as the sacrum and coccxy sustain pressure when the heels and LEs are elevated. In a similar fashion, a prophylactic sacral foam dressing is suggested to prevent pressure injury.    CKD (chronic kidney disease) stage 3, GFR 30-59 ml/min  Diabetic nephropathy (HCC) Monitor BUN/creatinine and electrolytes.   Anemia of chronic disease Monitor hematocrit and hemoglobin periodically.   Diabetes mellitus with renal manifestations, uncontrolled ([place]) Patient was hypoglycemic earlier. Lower dose Levemir CBG monitoring with regular insulin sensitive sliding scale.    Dehydration resolved   CHF (congestive heart failure) (HCC) No signs of decompensation, actually the patient looks very dehydrated. Continue walker and hold furosemide.   Depression Continue bupropion.  PT eval   Code Status: full Family Communication: patient Disposition Plan: home health PT   Consultants:  WOC  Procedures:     HPI/Subjective: Breathing better today  Objective: Filed Vitals:   05/04/15 0612 05/04/15 1430  BP: 131/53 135/58  Pulse: 71 75  Temp: 98.2 F (36.8 C) 97.9 F (36.6 C)  Resp: 18 20    Intake/Output Summary (Last 24 hours) at 05/04/15 1521 Last data filed at 05/03/15 1859  Gross per 24 hour  Intake     50 ml  Output      0 ml  Net     50 ml   Filed Weights   05/02/15 1800 05/02/15 1926 05/02/15 2110   Weight: 85.276 kg (188 lb) 85.276 kg (188 lb) 85.4 kg (188 lb 4.4 oz)    Exam:   General:  Awake, pleasant  Cardiovascular: rrr  Respiratory: exp wheezing  Abdomen: +BS, soft  Musculoskeletal: b/l Unna boots- remove by nursing  Data Reviewed: Basic Metabolic Panel:  Recent Labs Lab 05/02/15 1743 05/02/15 2052 05/03/15 0458 05/04/15 0448  NA 145  --  142 138  K 3.8  --  4.0 4.3  CL 116*  --  114* 113*  CO2 21*  --  22 18*  GLUCOSE 55*  --  87 114*  BUN 39*  --  36* 34*  CREATININE 2.09* 2.15* 2.01* 1.96*  CALCIUM 8.4*  --  8.2* 8.1*  MG  --  1.9  --   --   PHOS  --  3.2  --   --    Liver Function Tests:  Recent Labs Lab 05/02/15 1743 05/03/15 0458 05/04/15 0448  AST 36 41 34  ALT 18 18 16   ALKPHOS 187* 179* 164*  BILITOT 2.5* 2.4* 2.1*  PROT 6.6 6.0* 5.4*  ALBUMIN 1.9* 1.8* 1.6*   No results for input(s): LIPASE, AMYLASE in the last 168 hours. No results for input(s): AMMONIA in the last 168 hours. CBC:  Recent Labs Lab 05/02/15 1743 05/02/15 2052 05/03/15 0458 05/04/15 0448  WBC 7.1 7.0 5.8 6.1  NEUTROABS  --  4.8 3.7 2.8  HGB 9.4* 9.4* 9.4* 8.3*  HCT 26.7* 29.3* 28.6* 25.4*  MCV 103.1* 100.7* 100.4* 100.0  PLT 225 222 223 198   Cardiac Enzymes: No results for input(s): CKTOTAL, CKMB, CKMBINDEX, TROPONINI in the last 168 hours.  BNP (last 3 results) No results for input(s): BNP in the last 8760 hours.  ProBNP (last 3 results) No results for input(s): PROBNP in the last 8760 hours.  CBG:  Recent Labs Lab 05/03/15 1741 05/03/15 2320 05/04/15 0740 05/04/15 0807 05/04/15 1211  GLUCAP 158* 167* 54* 87 131*    Recent Results (from the past 240 hour(s))  Culture, blood (routine x 2)     Status: None (Preliminary result)   Collection Time: 05/02/15  5:40 PM  Result Value Ref Range Status   Specimen Description BLOOD RIGHT ARM  Final   Special Requests BOTTLES DRAWN AEROBIC AND ANAEROBIC 5CC  Final   Culture   Final    NO GROWTH 1  DAY Performed at [facility]    Report Status PENDING  Incomplete  Culture, blood (routine x 2)     Status: None (Preliminary result)   Collection Time: 05/02/15  5:44 PM  Result Value Ref Range Status   Specimen Description LEFT ANTECUBITAL  Final   Special Requests BOTTLES DRAWN AEROBIC AND ANAEROBIC 5CC  Final   Culture   Final    NO GROWTH 1 DAY Performed at [facility]    Report Status PENDING  Incomplete  Urine culture     Status: None (Preliminary result)   Collection Time: 05/02/15  7:09 PM  Result Value Ref Range Status   Specimen Description URINE, CLEAN CATCH  Final   Special Requests NONE  Final   Culture   Final    >=100,000 COLONIES/mL GRAM NEGATIVE RODS Performed at [facility]    Report Status PENDING  Incomplete  MRSA PCR Screening     Status: None   Collection Time: 05/02/15  8:57 PM  Result Value Ref Range Status   MRSA by PCR NEGATIVE NEGATIVE Final    Comment:        The GeneXpert MRSA Assay (FDA approved for NASAL specimens only), is one component of a comprehensive MRSA colonization surveillance program. It is not intended to diagnose MRSA infection nor to guide or monitor treatment for MRSA infections.      Studies: Dg Chest Port 1 View  05/02/2015  CLINICAL DATA:  Altered mental status, cough for 2 days. EXAM: PORTABLE CHEST 1 VIEW COMPARISON:  01/06/2015 FINDINGS: Moderate to large right pleural effusion with diffuse right lung airspace disease, most confluent in the right lung base. No confluent opacity on the left. Heart is borderline in size. No acute bony abnormality. IMPRESSION: Moderate to large right pleural effusion with diffuse right lung airspace disease concerning for pneumonia. Electronically Signed   By: Rolm Baptise M.D.   On: 05/02/2015 17:55    Scheduled Meds: . acidophilus  1 capsule Oral Daily  . aspirin EC  325 mg Oral Daily  . buPROPion  450 mg Oral Daily  . carvedilol  6.25 mg Oral BID WC  .  cefUROXime  500 mg Oral Q supper  . cholecalciferol  2,000 Units Oral Daily  . enoxaparin (LOVENOX) injection  30 mg Subcutaneous Q24H  . famotidine  20 mg Oral Daily  . gabapentin  300 mg Oral QHS  . insulin aspart  0-9 Units Subcutaneous TID WC  . insulin detemir  7 Units Subcutaneous BID  . ipratropium-albuterol  3 mL Nebulization TID  . pravastatin  20 mg Oral q1800  . ursodiol  300 mg Oral BID   Continuous Infusions:   Antibiotics Given (last 72 hours)    Date/Time Action Medication Dose  Rate   05/03/15 1859 Given   ceFEPIme (MAXIPIME) 1 g in dextrose 5 % 50 mL IVPB 1 g 100 mL/hr   05/03/15 2134 Given   vancomycin (VANCOCIN) IVPB 1000 mg/200 mL premix 1,000 mg 200 mL/hr      Principal Problem:   HCAP (healthcare-associated pneumonia) Active Problems:   CKD (chronic kidney disease) stage 3, GFR 30-59 ml/min   Anemia of chronic disease   Diabetes mellitus with renal manifestations, uncontrolled (HCC)   Diabetic nephropathy (HCC)   Dehydration   CHF (congestive heart failure) ([place])   Cellulitis of both lower extremities   Depression   Pressure ulcer    Time spent: 25 min    [facility] Pager [phone]. If 7PM-7AM, please contact night-coverage at [email or web address], password [facility] 05/04/2015, 3:21 PM  LOS: 2 days

## 2015-05-04 NOTE — Progress Notes (Signed)
Inpatient Diabetes Program Recommendations  AACE/ADA: New Consensus Statement on Inpatient Glycemic Control (2015)  Target Ranges:  Prepandial:   less than 140 mg/dL      Peak postprandial:   less than 180 mg/dL (1-2 hours)      Critically ill patients:  140 - 180 mg/dL   Results for Tamara Stephenson, Tamara Stephenson (MRN [id number]) as of 05/04/2015 09:33  Ref. Range 05/03/2015 07:52 05/03/2015 08:55 05/03/2015 12:03 05/03/2015 17:41 05/03/2015 23:20  Glucose-Capillary Latest Ref Range: 65-99 mg/dL 64 (L) 131 (H) 179 (H) 158 (H) 167 (H)   Results for Tamara Stephenson, Tamara Stephenson (MRN [id number]) as of 05/04/2015 09:33  Ref. Range 05/04/2015 07:40 05/04/2015 08:07  Glucose-Capillary Latest Ref Range: 65-99 mg/dL 54 (L) 87    Admit with: Pneumonia/ LE Cellulitis  History: DM, CKD, CHF  Home DM Meds: Levemir 10 units bid       Novolog 3 units tidwc (per report, patient not taking)  Current Insulin Orders: Levemir 10 units bid      Novolog Sensitive Correction Scale/ SSI (0-9 units) TID AC      MD- Patient with Hypoglycemia in the AM for the last 2 days.  Please consider reducing Levemir to 7 units bid (this would be approximately 75% of total home dose)      --Will follow patient during hospitalization--  Wyn Quaker RN, MSN, CDE Diabetes Coordinator Inpatient Glycemic Control Team Team Pager: [phone] (8a-5p)

## 2015-05-04 NOTE — Progress Notes (Signed)
Hypoglycemic Event  CBG: 54  Treatment: 15 GM carbohydrate snack  Symptoms: None  Follow-up CBG: [id number] CBG Result:87  Possible Reasons for Event: Unknown  Comments/MD notified:hypoglycemic event    Illa Level

## 2015-05-04 NOTE — Progress Notes (Signed)
Pharmacy Antibiotic Note  Tamara Stephenson is a 82 y.o. female admitted on 05/02/2015 with pneumonia and cellulitis.  Pharmacy was initially consulted for vancomycin and cefepime dosing, now narrowed to Ceftin  Plan:  Ceftin 500 mg PO q24h  Dosage remains stable and need for further dosage adjustment appears unlikely at present.  Pharmacy will sign off at this time.  Please reconsult if a change in clinical status warrants re-evaluation of dosage.   Height: 5\' 6"  (167.6 cm) Weight: 188 lb 4.4 oz (85.4 kg) IBW/kg (Calculated) : 59.3  Temp (24hrs), Avg:98.5 F (36.9 C), Min:98.2 F (36.8 C), Max:98.7 F (37.1 C)   Recent Labs Lab 05/02/15 1743 05/02/15 1759 05/02/15 2052 05/03/15 0458 05/04/15 0448  WBC 7.1  --  7.0 5.8 6.1  CREATININE 2.09*  --  2.15* 2.01* 1.96*  LATICACIDVEN  --  0.92  --   --   --     Estimated Creatinine Clearance: 25.6 mL/min (by C-G formula based on Cr of 1.96).    Allergies  Allergen Reactions  . Peanut-Containing Drug Products Nausea And Vomiting    Antimicrobials this admission: 2/25 vanc>> 2/27 2/25 cefepime>> 2/27 2/27 ceftin >>   Dose adjustments this admission:  Microbiology results: 2/25 bcx x2: pending 2/25 Ucx: pending Sputum: not collected 2/25 influenza PCR: neg 2/25 Strep Ag: neg 2/25 legionella: pending 2/25 MRSA PCR: neg  Thank you for allowing pharmacy to be a part of this patient's care.  Gretta Arab PharmD, BCPS Pager [phone] 05/04/2015 11:57 AM

## 2015-05-05 LAB — URINE CULTURE

## 2015-05-05 LAB — CBC WITH DIFFERENTIAL/PLATELET
BASOS ABS: 0.1 10*3/uL (ref 0.0–0.1)
BASOS PCT: 1 %
EOS ABS: 0.6 10*3/uL (ref 0.0–0.7)
Eosinophils Relative: 9 %
HEMATOCRIT: 27.7 % — AB (ref 36.0–46.0)
HEMOGLOBIN: 9 g/dL — AB (ref 12.0–15.0)
Lymphocytes Relative: 35 %
Lymphs Abs: 2.5 10*3/uL (ref 0.7–4.0)
MCH: 32.6 pg (ref 26.0–34.0)
MCHC: 32.5 g/dL (ref 30.0–36.0)
MCV: 100.4 fL — ABNORMAL HIGH (ref 78.0–100.0)
Monocytes Absolute: 0.8 10*3/uL (ref 0.1–1.0)
Monocytes Relative: 12 %
NEUTROS ABS: 3 10*3/uL (ref 1.7–7.7)
NEUTROS PCT: 43 %
Platelets: 213 10*3/uL (ref 150–400)
RBC: 2.76 MIL/uL — ABNORMAL LOW (ref 3.87–5.11)
RDW: 18.5 % — AB (ref 11.5–15.5)
WBC: 7 10*3/uL (ref 4.0–10.5)

## 2015-05-05 LAB — COMPREHENSIVE METABOLIC PANEL
ALK PHOS: 175 U/L — AB (ref 38–126)
ALT: 16 U/L (ref 14–54)
ANION GAP: 5 (ref 5–15)
AST: 33 U/L (ref 15–41)
Albumin: 1.7 g/dL — ABNORMAL LOW (ref 3.5–5.0)
BILIRUBIN TOTAL: 2 mg/dL — AB (ref 0.3–1.2)
BUN: 33 mg/dL — ABNORMAL HIGH (ref 6–20)
CALCIUM: 8.7 mg/dL — AB (ref 8.9–10.3)
CO2: 22 mmol/L (ref 22–32)
CREATININE: 1.84 mg/dL — AB (ref 0.44–1.00)
Chloride: 113 mmol/L — ABNORMAL HIGH (ref 101–111)
GFR, EST AFRICAN AMERICAN: 29 mL/min — AB (ref >60)
GFR, EST NON AFRICAN AMERICAN: 25 mL/min — AB (ref >60)
Glucose, Bld: 105 mg/dL — ABNORMAL HIGH (ref 65–99)
Potassium: 4.6 mmol/L (ref 3.5–5.1)
SODIUM: 140 mmol/L (ref 135–145)
TOTAL PROTEIN: 6 g/dL — AB (ref 6.5–8.1)

## 2015-05-05 LAB — GLUCOSE, CAPILLARY
GLUCOSE-CAPILLARY: 230 mg/dL — AB (ref 65–99)
GLUCOSE-CAPILLARY: 148 mg/dL — AB (ref 65–99)
Glucose-Capillary: 70 mg/dL (ref 65–99)

## 2015-05-05 MED ORDER — CEFUROXIME AXETIL 500 MG PO TABS
500.0000 mg | ORAL_TABLET | Freq: Two times a day (BID) | ORAL | Status: DC
  Filled 2015-05-05 (×2): qty 1

## 2015-05-05 MED ORDER — INSULIN DETEMIR 100 UNIT/ML ~~LOC~~ SOLN: 7.0000 [IU] | Freq: Two times a day (BID) | SUBCUTANEOUS | Status: AC

## 2015-05-05 MED ORDER — FOSFOMYCIN TROMETHAMINE 3 G PO PACK
3.0000 g | PACK | Freq: Once | ORAL | Status: DC
  Filled 2015-05-05: qty 3

## 2015-05-05 MED ORDER — CEFUROXIME AXETIL 500 MG PO TABS: 500.0000 mg | ORAL_TABLET | Freq: Two times a day (BID) | ORAL | Status: DC

## 2015-05-05 NOTE — Progress Notes (Addendum)
CSW met with pt to assist with d/c planning. Pt will be moving in to the [employer] at d/c, possibly today. HHPT will be needed. Pt reports staff from [facility] will provide transport from the hospital to [place]. CSW as contacted [facility] to assist with transportation. Awaiting return call.  Deeya Richeson LCSW [phone]  13:00 [facility] will send Lucianne Lei to transport pt back to her apt after 1:pm this afternoon. Pt / nsg are aware.  Kerah Hardebeck LCSW [phone]

## 2015-05-05 NOTE — Care Management Note (Signed)
Case Management Note  Patient Details  Name: Tamara Stephenson MRN: [id number] Date of Birth: 03/05/1934  Subjective/Objective:                  Altered mental status. Action/Plan: Discharge planning Expected Discharge Date:  05/05/15               Expected Discharge Plan:  Home w Home Health Services  In-House Referral:     Discharge planning Services  CM Consult  Post Acute Care Choice:  Home Health Choice offered to:  Patient  DME Arranged:  N/A DME Agency:  NA  HH Arranged:  RN, PT, OT HH Agency:     Status of Service:  Completed, signed off  Medicare Important Message Given:    Date Medicare IM Given:    Medicare IM give by:    Date Additional Medicare IM Given:    Additional Medicare Important Message give by:     If discussed at Long Length of Stay Meetings, dates discussed:    Additional Comments: CM met with pt in room who confirms she lives at [facility] in [place] and is already active with [facility].  Pt states she has all the DME needed at home. Cm received call from [facility] who state they have access to EPIC and please ensure orders have been placed to resume HH care.  CM texted MD who placed order for HHPT/OT/RN.  CSW arranging transportation home.  No other CM needs were communicated. ,  Christine, RN 05/05/2015, 12:57 PM  

## 2015-05-05 NOTE — Progress Notes (Signed)
Infection prevention nurse notified primary RN of e-coli in patient's urine. Patient placed on contact precautions per policy.

## 2015-05-05 NOTE — Progress Notes (Signed)
Pharmacy Consult - Antibiotic Renal Dose Adjustment  Assessment:  Tamara Stephenson is a 82 y.o. female admitted on 05/02/2015 with pneumonia and cellulitis. Pharmacy was initially consulted for vancomycin and cefepime dosing, now narrowed to Ceftin.  AoCKD with SCr improving.    Plan: Adjust Ceftin to 500 mg PO BID for CrCl>30 ml/min.  Tamara Stephenson, PharmD, BCPS Pager: [phone] 05/05/2015 10:34 AM

## 2015-05-05 NOTE — Progress Notes (Signed)
Pt d/c'd to [facility]. Transportation was provided by this facility and picked her up a little after 1300. Pt's dsg was changed and the process was explained to her. Extra dsg supplies provided. Assessed for pain and need for pain medication but this offer was declined. Patient explained to nurse she "preferred" to wear O2, but patient was 100% on RA, so it was explained that she did not need O2. Patient is ready and anxious to leave and expresses no concerns.

## 2015-05-05 NOTE — Discharge Summary (Addendum)
Physician Discharge Summary  Tamara Stephenson [id number] DOB: 12-17-1933 DOA: 05/02/2015  PCP: Curly Rim, MD  Admit date: 05/02/2015 Discharge date: 05/05/2015  Time spent: 35 minutes  Recommendations for Outpatient Follow-up:  1. Wound care as below-- once open areas healed, place in UNNA BOOTS 2. Patient was already planning on moving to ILF-- not sure how she will do, home health RN/PT/OT to bridge 3. Monitor blood sugars-- would benefit from SSI but not sure patient could do this-- defer to PCP 4. Cbc, BMP 1 week   Discharge Diagnoses:  Principal Problem:   HCAP (healthcare-associated pneumonia) Active Problems:   CKD (chronic kidney disease) stage 3, GFR 30-59 ml/min   Anemia of chronic disease   Diabetes mellitus with renal manifestations, uncontrolled ([place])   Diabetic nephropathy (HCC)   Dehydration   CHF (congestive heart failure) (HCC)   Cellulitis of both lower extremities   Depression   Pressure ulcer ESBL UTI  Discharge Condition: improved  Diet recommendation: cardiac/diabetic  Filed Weights   05/02/15 1926 05/02/15 2110 05/05/15 0409  Weight: 85.276 kg (188 lb) 85.4 kg (188 lb 4.4 oz) 93.305 kg (205 lb 11.2 oz)    History of present illness:  Tamara Stephenson is a 82 y.o. female with a past medical history of chronic back pain, hypertension, currently a stenosis, type 2 diabetes, diabetic neuropathy, chronic kidney disease secondary to diabetic nephropathy, depression, obesity, who is being treated for cellulitis of both lower legs and is brought to the emergency department via EMS due to altered mental status.  The patient's mental status has gradually improved, but she is unable to provide further details to her history of present illness. Workup in the emergency department with worsening renal function, anemia and a chest radiograph showing a moderate to large right pleural effusion with diffuse right lung airspace disease.   Hospital Course:  HCAP  (healthcare-associated pneumonia) No longer on O2-- 100% on RA Continue bronchodilators as needed. abx- ceftin--- then back tot chronic abx Blood cultures negative  ESBL UTI -treat with 1 dose of fosfomycin   Cellulitis of lower extremities- reported Severe venous insufficiency Remove unna boots -wound care:Petrolatum gauze to the left heel twice daily with pressure redistribution boots bilaterally. A pressure redistribution chair pad for use when OOB as the sacrum and coccxy sustain pressure when the heels and LEs are elevated. In a similar fashion, a prophylactic sacral foam dressing is suggested to prevent pressure injury.    CKD (chronic kidney disease) stage 3, GFR 30-59 ml/min  Diabetic nephropathy (HCC) Monitor BUN/creatinine and electrolytes.   Anemia of chronic disease Monitor hematocrit and hemoglobin periodically.   Diabetes mellitus with renal manifestations, uncontrolled (HCC) Lower dose Levemir    Dehydration resolved   CHF (congestive heart failure) (HCC) Resume lasix at d/c   Depression Continue bupropion.  Procedures:    Consultations:  PT  Wound care  Discharge Exam: Filed Vitals:   05/05/15 0339 05/05/15 1047  BP: 138/78 138/65  Pulse: 73 74  Temp: 97.7 F (36.5 C) 97.7 F (36.5 C)  Resp:      General: awake, NAD Cardiovascular: rrr Respiratory: clear  Discharge Instructions   Discharge Instructions    Diet - low sodium heart healthy    Complete by:  As directed      Diet Carb Modified    Complete by:  As directed      Discharge instructions    Complete by:  As directed   Monitor blood sugars as tend to go  low in the AM Resume keflex after ceftin complete Petrolatum gauze to the left heel twice daily with pressure redistribution boots bilaterally. A pressure redistribution chair pad for use when OOB as the sacrum and coccxy sustain pressure when the heels and LEs are elevated. In a similar fashion, a prophylactic  sacral foam dressing is suggested to prevent pressure injury. Home health RN/PT/OT     Increase activity slowly    Complete by:  As directed           Current Discharge Medication List    START taking these medications   Details  cefUROXime (CEFTIN) 500 MG tablet Take 1 tablet (500 mg total) by mouth 2 (two) times daily with a meal. Qty: 10 tablet, Refills: 0      CONTINUE these medications which have CHANGED   Details  insulin detemir (LEVEMIR) 100 UNIT/ML injection Inject 0.07 mLs (7 Units total) into the skin 2 (two) times daily. Qty: 10 mL, Refills: 11      CONTINUE these medications which have NOT CHANGED   Details  acetaminophen (TYLENOL) 500 MG tablet Take 500 mg by mouth every 6 (six) hours as needed for moderate pain.     acidophilus (RISAQUAD) CAPS capsule Take 1 capsule by mouth daily. Qty: 30 capsule, Refills: 0    aspirin EC 325 MG EC tablet Take 1 tablet (325 mg total) by mouth 2 (two) times daily after a meal. Qty: 30 tablet, Refills: 0    BuPROPion HCl ER, XL, 450 MG TB24 Take 450 mg by mouth daily.    carvedilol (COREG) 6.25 MG tablet Take 1 tablet (6.25 mg total) by mouth 2 (two) times daily with a meal. Qty: 60 tablet, Refills: 0    cephALEXin (KEFLEX) 500 MG capsule Take 1 capsule (500 mg total) by mouth 2 (two) times daily. Qty: 10 capsule, Refills: 0    cholecalciferol (VITAMIN D) 1000 UNITS tablet Take 2,000 Units by mouth daily.     furosemide (LASIX) 40 MG tablet Take 1 tablet (40 mg total) by mouth daily. Qty: 30 tablet, Refills: 0    gabapentin (NEURONTIN) 300 MG capsule Take 1 capsule (300 mg total) by mouth at bedtime.    guaiFENesin-dextromethorphan (ROBITUSSIN DM) 100-10 MG/5ML syrup Take 10 mLs by mouth every 6 (six) hours as needed for cough.    liver oil-zinc oxide (DESITIN) 40 % ointment Apply 1 application topically as needed for irritation.    lovastatin (MEVACOR) 20 MG tablet Take 20 mg by mouth at bedtime.    ondansetron  (ZOFRAN) 4 MG tablet Take 4 mg by mouth every 6 (six) hours as needed for nausea or vomiting.    potassium chloride SA (K-DUR,KLOR-CON) 20 MEQ tablet Take 2 tablets (40 mEq total) by mouth daily. Qty: 30 tablet, Refills: 0    ranitidine (ZANTAC) 300 MG tablet Take 300 mg by mouth daily as needed for heartburn.     senna-docusate (SENOKOT S) 8.6-50 MG tablet Take 2 tablets by mouth daily as needed for mild constipation.    ursodiol (ACTIGALL) 300 MG capsule Take 300 mg by mouth 2 (two) times daily.       STOP taking these medications     HYDROcodone-acetaminophen (NORCO/VICODIN) 5-325 MG tablet      insulin aspart (NOVOLOG) 100 UNIT/ML injection      lactulose (CHRONULAC) 10 GM/15ML solution        Allergies  Allergen Reactions  . Peanut-Containing Drug Products Nausea And Vomiting      The  results of significant diagnostics from this hospitalization (including imaging, microbiology, ancillary and laboratory) are listed below for reference.    Significant Diagnostic Studies: Dg Chest Port 1 View  05/02/2015  CLINICAL DATA:  Altered mental status, cough for 2 days. EXAM: PORTABLE CHEST 1 VIEW COMPARISON:  01/06/2015 FINDINGS: Moderate to large right pleural effusion with diffuse right lung airspace disease, most confluent in the right lung base. No confluent opacity on the left. Heart is borderline in size. No acute bony abnormality. IMPRESSION: Moderate to large right pleural effusion with diffuse right lung airspace disease concerning for pneumonia. Electronically Signed   By: Rolm Baptise M.D.   On: 05/02/2015 17:55    Microbiology: Recent Results (from the past 240 hour(s))  Culture, blood (routine x 2)     Status: None (Preliminary result)   Collection Time: 05/02/15  5:40 PM  Result Value Ref Range Status   Specimen Description BLOOD RIGHT ARM  Final   Special Requests BOTTLES DRAWN AEROBIC AND ANAEROBIC 5CC  Final   Culture   Final    NO GROWTH 1 DAY Performed at  [facility]    Report Status PENDING  Incomplete  Culture, blood (routine x 2)     Status: None (Preliminary result)   Collection Time: 05/02/15  5:44 PM  Result Value Ref Range Status   Specimen Description LEFT ANTECUBITAL  Final   Special Requests BOTTLES DRAWN AEROBIC AND ANAEROBIC 5CC  Final   Culture   Final    NO GROWTH 1 DAY Performed at [facility]    Report Status PENDING  Incomplete  Urine culture     Status: None   Collection Time: 05/02/15  7:09 PM  Result Value Ref Range Status   Specimen Description URINE, CLEAN CATCH  Final   Special Requests NONE  Final   Culture   Final    >=100,000 COLONIES/mL ESCHERICHIA COLI Confirmed Extended Spectrum Beta-Lactamase Producer (ESBL) Performed at [facility]    Report Status 05/05/2015 FINAL  Final   Organism ID, Bacteria ESCHERICHIA COLI  Final      Susceptibility   Escherichia coli - MIC*    AMPICILLIN >=32 RESISTANT Resistant     CEFAZOLIN >=64 RESISTANT Resistant     CEFTRIAXONE >=64 RESISTANT Resistant     CIPROFLOXACIN >=4 RESISTANT Resistant     GENTAMICIN <=1 SENSITIVE Sensitive     IMIPENEM <=0.25 SENSITIVE Sensitive     NITROFURANTOIN <=16 SENSITIVE Sensitive     TRIMETH/SULFA >=320 RESISTANT Resistant     AMPICILLIN/SULBACTAM >=32 RESISTANT Resistant     PIP/TAZO >=128 RESISTANT Resistant     * >=100,000 COLONIES/mL ESCHERICHIA COLI  MRSA PCR Screening     Status: None   Collection Time: 05/02/15  8:57 PM  Result Value Ref Range Status   MRSA by PCR NEGATIVE NEGATIVE Final    Comment:        The GeneXpert MRSA Assay (FDA approved for NASAL specimens only), is one component of a comprehensive MRSA colonization surveillance program. It is not intended to diagnose MRSA infection nor to guide or monitor treatment for MRSA infections.      Labs: Basic Metabolic Panel:  Recent Labs Lab 05/02/15 1743 05/02/15 2052 05/03/15 0458 05/04/15 0448 05/05/15 0348  NA 145  --  142  138 140  K 3.8  --  4.0 4.3 4.6  CL 116*  --  114* 113* 113*  CO2 21*  --  22 18* 22  GLUCOSE 55*  --  87 114* 105*  BUN 39*  --  36* 34* 33*  CREATININE 2.09* 2.15* 2.01* 1.96* 1.84*  CALCIUM 8.4*  --  8.2* 8.1* 8.7*  MG  --  1.9  --   --   --   PHOS  --  3.2  --   --   --    Liver Function Tests:  Recent Labs Lab 05/02/15 1743 05/03/15 0458 05/04/15 0448 05/05/15 0348  AST 36 41 34 33  ALT 18 18 16 16   ALKPHOS 187* 179* 164* 175*  BILITOT 2.5* 2.4* 2.1* 2.0*  PROT 6.6 6.0* 5.4* 6.0*  ALBUMIN 1.9* 1.8* 1.6* 1.7*   No results for input(s): LIPASE, AMYLASE in the last 168 hours. No results for input(s): AMMONIA in the last 168 hours. CBC:  Recent Labs Lab 05/02/15 1743 05/02/15 2052 05/03/15 0458 05/04/15 0448 05/05/15 0348  WBC 7.1 7.0 5.8 6.1 7.0  NEUTROABS  --  4.8 3.7 2.8 3.0  HGB 9.4* 9.4* 9.4* 8.3* 9.0*  HCT 26.7* 29.3* 28.6* 25.4* 27.7*  MCV 103.1* 100.7* 100.4* 100.0 100.4*  PLT 225 222 223 198 213   Cardiac Enzymes: No results for input(s): CKTOTAL, CKMB, CKMBINDEX, TROPONINI in the last 168 hours. BNP: BNP (last 3 results) No results for input(s): BNP in the last 8760 hours.  ProBNP (last 3 results) No results for input(s): PROBNP in the last 8760 hours.  CBG:  Recent Labs Lab 05/04/15 0807 05/04/15 1211 05/04/15 1709 05/04/15 2044 05/05/15 0729  GLUCAP 87 131* 202* 230* 70       Signed:  [facility] 05/05/2015, 12:18 PM

## 2015-05-08 LAB — CULTURE, BLOOD (ROUTINE X 2)
CULTURE: NO GROWTH
Culture: NO GROWTH

## 2015-05-19 ENCOUNTER — Inpatient Hospital Stay (HOSPITAL_COMMUNITY)
Admission: EM | Admit: 2015-05-19 | Discharge: 2015-06-01 | DRG: 870 | Disposition: A | Discharge status: Skilled Nursing Facility | Payer: Medicare HMO | Attending: Internal Medicine | Admitting: Internal Medicine

## 2015-05-19 ENCOUNTER — Encounter (HOSPITAL_COMMUNITY): Payer: Self-pay | Admitting: Cardiology

## 2015-05-19 ENCOUNTER — Inpatient Hospital Stay (HOSPITAL_COMMUNITY): Payer: Medicare HMO

## 2015-05-19 ENCOUNTER — Emergency Department (HOSPITAL_COMMUNITY): Payer: Medicare HMO

## 2015-05-19 DIAGNOSIS — I959 Hypotension, unspecified: Secondary | ICD-10-CM | POA: Diagnosis present

## 2015-05-19 DIAGNOSIS — F17200 Nicotine dependence, unspecified, uncomplicated: Secondary | ICD-10-CM | POA: Diagnosis present

## 2015-05-19 DIAGNOSIS — R680 Hypothermia, not associated with low environmental temperature: Secondary | ICD-10-CM | POA: Diagnosis present

## 2015-05-19 DIAGNOSIS — A4181 Sepsis due to Enterococcus: Secondary | ICD-10-CM | POA: Diagnosis present

## 2015-05-19 DIAGNOSIS — I4891 Unspecified atrial fibrillation: Secondary | ICD-10-CM | POA: Diagnosis not present

## 2015-05-19 DIAGNOSIS — L89620 Pressure ulcer of left heel, unstageable: Secondary | ICD-10-CM | POA: Diagnosis present

## 2015-05-19 DIAGNOSIS — I5042 Chronic combined systolic (congestive) and diastolic (congestive) heart failure: Secondary | ICD-10-CM | POA: Diagnosis present

## 2015-05-19 DIAGNOSIS — E1129 Type 2 diabetes mellitus with other diabetic kidney complication: Secondary | ICD-10-CM | POA: Diagnosis present

## 2015-05-19 DIAGNOSIS — L8990 Pressure ulcer of unspecified site, unspecified stage: Secondary | ICD-10-CM | POA: Diagnosis present

## 2015-05-19 DIAGNOSIS — Z4682 Encounter for fitting and adjustment of non-vascular catheter: Secondary | ICD-10-CM

## 2015-05-19 DIAGNOSIS — L89159 Pressure ulcer of sacral region, unspecified stage: Secondary | ICD-10-CM | POA: Diagnosis present

## 2015-05-19 DIAGNOSIS — A419 Sepsis, unspecified organism: Secondary | ICD-10-CM | POA: Diagnosis present

## 2015-05-19 DIAGNOSIS — I248 Other forms of acute ischemic heart disease: Secondary | ICD-10-CM | POA: Diagnosis present

## 2015-05-19 DIAGNOSIS — M7989 Other specified soft tissue disorders: Secondary | ICD-10-CM | POA: Diagnosis not present

## 2015-05-19 DIAGNOSIS — Z7982 Long term (current) use of aspirin: Secondary | ICD-10-CM | POA: Diagnosis not present

## 2015-05-19 DIAGNOSIS — L03116 Cellulitis of left lower limb: Secondary | ICD-10-CM | POA: Diagnosis present

## 2015-05-19 DIAGNOSIS — R188 Other ascites: Secondary | ICD-10-CM | POA: Diagnosis present

## 2015-05-19 DIAGNOSIS — E1122 Type 2 diabetes mellitus with diabetic chronic kidney disease: Secondary | ICD-10-CM | POA: Diagnosis present

## 2015-05-19 DIAGNOSIS — N179 Acute kidney failure, unspecified: Secondary | ICD-10-CM | POA: Diagnosis present

## 2015-05-19 DIAGNOSIS — Z794 Long term (current) use of insulin: Secondary | ICD-10-CM

## 2015-05-19 DIAGNOSIS — J9601 Acute respiratory failure with hypoxia: Secondary | ICD-10-CM | POA: Diagnosis present

## 2015-05-19 DIAGNOSIS — J939 Pneumothorax, unspecified: Secondary | ICD-10-CM | POA: Diagnosis not present

## 2015-05-19 DIAGNOSIS — Z4659 Encounter for fitting and adjustment of other gastrointestinal appliance and device: Secondary | ICD-10-CM

## 2015-05-19 DIAGNOSIS — Z79899 Other long term (current) drug therapy: Secondary | ICD-10-CM | POA: Diagnosis not present

## 2015-05-19 DIAGNOSIS — J9690 Respiratory failure, unspecified, unspecified whether with hypoxia or hypercapnia: Secondary | ICD-10-CM | POA: Diagnosis present

## 2015-05-19 DIAGNOSIS — J90 Pleural effusion, not elsewhere classified: Secondary | ICD-10-CM | POA: Diagnosis present

## 2015-05-19 DIAGNOSIS — R609 Edema, unspecified: Secondary | ICD-10-CM

## 2015-05-19 DIAGNOSIS — R627 Adult failure to thrive: Secondary | ICD-10-CM | POA: Diagnosis present

## 2015-05-19 DIAGNOSIS — Z66 Do not resuscitate: Secondary | ICD-10-CM | POA: Diagnosis not present

## 2015-05-19 DIAGNOSIS — R05 Cough: Secondary | ICD-10-CM

## 2015-05-19 DIAGNOSIS — E785 Hyperlipidemia, unspecified: Secondary | ICD-10-CM | POA: Diagnosis present

## 2015-05-19 DIAGNOSIS — E870 Hyperosmolality and hypernatremia: Secondary | ICD-10-CM | POA: Diagnosis not present

## 2015-05-19 DIAGNOSIS — K7460 Unspecified cirrhosis of liver: Secondary | ICD-10-CM | POA: Diagnosis not present

## 2015-05-19 DIAGNOSIS — Z7189 Other specified counseling: Secondary | ICD-10-CM | POA: Diagnosis not present

## 2015-05-19 DIAGNOSIS — Z515 Encounter for palliative care: Secondary | ICD-10-CM | POA: Diagnosis not present

## 2015-05-19 DIAGNOSIS — G9340 Encephalopathy, unspecified: Secondary | ICD-10-CM | POA: Diagnosis present

## 2015-05-19 DIAGNOSIS — N183 Chronic kidney disease, stage 3 unspecified: Secondary | ICD-10-CM | POA: Diagnosis present

## 2015-05-19 DIAGNOSIS — I130 Hypertensive heart and chronic kidney disease with heart failure and stage 1 through stage 4 chronic kidney disease, or unspecified chronic kidney disease: Secondary | ICD-10-CM | POA: Diagnosis present

## 2015-05-19 DIAGNOSIS — G9341 Metabolic encephalopathy: Secondary | ICD-10-CM | POA: Diagnosis present

## 2015-05-19 DIAGNOSIS — Z9911 Dependence on respirator [ventilator] status: Secondary | ICD-10-CM | POA: Diagnosis not present

## 2015-05-19 DIAGNOSIS — L03115 Cellulitis of right lower limb: Secondary | ICD-10-CM | POA: Diagnosis present

## 2015-05-19 DIAGNOSIS — IMO0002 Reserved for concepts with insufficient information to code with codable children: Secondary | ICD-10-CM | POA: Diagnosis present

## 2015-05-19 DIAGNOSIS — K5900 Constipation, unspecified: Secondary | ICD-10-CM

## 2015-05-19 DIAGNOSIS — E872 Acidosis: Secondary | ICD-10-CM | POA: Diagnosis present

## 2015-05-19 DIAGNOSIS — J189 Pneumonia, unspecified organism: Secondary | ICD-10-CM | POA: Diagnosis present

## 2015-05-19 DIAGNOSIS — K7031 Alcoholic cirrhosis of liver with ascites: Secondary | ICD-10-CM | POA: Diagnosis not present

## 2015-05-19 DIAGNOSIS — F17210 Nicotine dependence, cigarettes, uncomplicated: Secondary | ICD-10-CM | POA: Diagnosis present

## 2015-05-19 DIAGNOSIS — D638 Anemia in other chronic diseases classified elsewhere: Secondary | ICD-10-CM | POA: Diagnosis not present

## 2015-05-19 DIAGNOSIS — Z96641 Presence of right artificial hip joint: Secondary | ICD-10-CM | POA: Diagnosis present

## 2015-05-19 DIAGNOSIS — Z9689 Presence of other specified functional implants: Secondary | ICD-10-CM

## 2015-05-19 DIAGNOSIS — R1310 Dysphagia, unspecified: Secondary | ICD-10-CM | POA: Diagnosis not present

## 2015-05-19 DIAGNOSIS — Y95 Nosocomial condition: Secondary | ICD-10-CM | POA: Diagnosis present

## 2015-05-19 DIAGNOSIS — K7290 Hepatic failure, unspecified without coma: Secondary | ICD-10-CM | POA: Diagnosis present

## 2015-05-19 DIAGNOSIS — I509 Heart failure, unspecified: Secondary | ICD-10-CM

## 2015-05-19 DIAGNOSIS — J948 Other specified pleural conditions: Secondary | ICD-10-CM | POA: Diagnosis not present

## 2015-05-19 DIAGNOSIS — J9600 Acute respiratory failure, unspecified whether with hypoxia or hypercapnia: Secondary | ICD-10-CM

## 2015-05-19 DIAGNOSIS — Z452 Encounter for adjustment and management of vascular access device: Secondary | ICD-10-CM | POA: Insufficient documentation

## 2015-05-19 DIAGNOSIS — E860 Dehydration: Secondary | ICD-10-CM | POA: Diagnosis present

## 2015-05-19 DIAGNOSIS — E1165 Type 2 diabetes mellitus with hyperglycemia: Secondary | ICD-10-CM

## 2015-05-19 DIAGNOSIS — B952 Enterococcus as the cause of diseases classified elsewhere: Secondary | ICD-10-CM | POA: Diagnosis not present

## 2015-05-19 DIAGNOSIS — R059 Cough, unspecified: Secondary | ICD-10-CM

## 2015-05-19 DIAGNOSIS — Z789 Other specified health status: Secondary | ICD-10-CM | POA: Diagnosis not present

## 2015-05-19 DIAGNOSIS — M25521 Pain in right elbow: Secondary | ICD-10-CM

## 2015-05-19 DIAGNOSIS — Z978 Presence of other specified devices: Secondary | ICD-10-CM

## 2015-05-19 HISTORY — DX: Pneumonia, unspecified organism: J18.9

## 2015-05-19 LAB — I-STAT CG4 LACTIC ACID, ED
LACTIC ACID, VENOUS: 2.42 mmol/L — AB (ref 0.5–2.0)
Lactic Acid, Venous: 1.9 mmol/L (ref 0.5–2.0)

## 2015-05-19 LAB — CBC WITH DIFFERENTIAL/PLATELET
Basophils Absolute: 0 10*3/uL (ref 0.0–0.1)
Basophils Relative: 0 %
EOS ABS: 0 10*3/uL (ref 0.0–0.7)
EOS PCT: 0 %
HCT: 33 % — ABNORMAL LOW (ref 36.0–46.0)
Hemoglobin: 11 g/dL — ABNORMAL LOW (ref 12.0–15.0)
LYMPHS ABS: 1.5 10*3/uL (ref 0.7–4.0)
LYMPHS PCT: 16 %
MCH: 33 pg (ref 26.0–34.0)
MCHC: 33.3 g/dL (ref 30.0–36.0)
MCV: 99.1 fL (ref 78.0–100.0)
MONOS PCT: 10 %
Monocytes Absolute: 0.9 10*3/uL (ref 0.1–1.0)
Neutro Abs: 6.9 10*3/uL (ref 1.7–7.7)
Neutrophils Relative %: 74 %
PLATELETS: 244 10*3/uL (ref 150–400)
RBC: 3.33 MIL/uL — AB (ref 3.87–5.11)
RDW: 19 % — ABNORMAL HIGH (ref 11.5–15.5)
WBC: 9.3 10*3/uL (ref 4.0–10.5)

## 2015-05-19 LAB — COMPREHENSIVE METABOLIC PANEL
ALK PHOS: 202 U/L — AB (ref 38–126)
ALT: 28 U/L (ref 14–54)
ANION GAP: 9 (ref 5–15)
AST: 78 U/L — ABNORMAL HIGH (ref 15–41)
Albumin: 1.8 g/dL — ABNORMAL LOW (ref 3.5–5.0)
BUN: 46 mg/dL — ABNORMAL HIGH (ref 6–20)
CALCIUM: 9.3 mg/dL (ref 8.9–10.3)
CHLORIDE: 112 mmol/L — AB (ref 101–111)
CO2: 23 mmol/L (ref 22–32)
CREATININE: 1.62 mg/dL — AB (ref 0.44–1.00)
GFR, EST AFRICAN AMERICAN: 34 mL/min — AB (ref >60)
GFR, EST NON AFRICAN AMERICAN: 29 mL/min — AB (ref >60)
Glucose, Bld: 99 mg/dL (ref 65–99)
Potassium: 4.7 mmol/L (ref 3.5–5.1)
Sodium: 144 mmol/L (ref 135–145)
Total Bilirubin: 1.7 mg/dL — ABNORMAL HIGH (ref 0.3–1.2)
Total Protein: 6.2 g/dL — ABNORMAL LOW (ref 6.5–8.1)

## 2015-05-19 LAB — URINALYSIS, ROUTINE W REFLEX MICROSCOPIC
Bilirubin Urine: NEGATIVE
Glucose, UA: NEGATIVE mg/dL
HGB URINE DIPSTICK: NEGATIVE
Ketones, ur: NEGATIVE mg/dL
Leukocytes, UA: NEGATIVE
NITRITE: NEGATIVE
PROTEIN: NEGATIVE mg/dL
SPECIFIC GRAVITY, URINE: 1.015 (ref 1.005–1.030)
pH: 5.5 (ref 5.0–8.0)

## 2015-05-19 LAB — I-STAT TROPONIN, ED
TROPONIN I, POC: 0.02 ng/mL (ref 0.00–0.08)
Troponin i, poc: 0.03 ng/mL (ref 0.00–0.08)

## 2015-05-19 LAB — CK: Total CK: 409 U/L — ABNORMAL HIGH (ref 38–234)

## 2015-05-19 MED ORDER — VANCOMYCIN HCL IN DEXTROSE 1-5 GM/200ML-% IV SOLN: 1000.0000 mg | Freq: Once | INTRAVENOUS | Status: DC

## 2015-05-19 MED ORDER — SODIUM CHLORIDE 0.9 % IV BOLUS (SEPSIS): 500.0000 mL | Freq: Once | INTRAVENOUS | Status: DC

## 2015-05-19 MED ORDER — SODIUM CHLORIDE 0.9 % IV BOLUS (SEPSIS)
1000.0000 mL | INTRAVENOUS | Status: AC
  Administered 2015-05-19 (×3): 1000 mL via INTRAVENOUS

## 2015-05-19 MED ORDER — VANCOMYCIN HCL 10 G IV SOLR
1500.0000 mg | Freq: Once | INTRAVENOUS | Status: AC
  Administered 2015-05-19: 1500 mg via INTRAVENOUS
  Filled 2015-05-19: qty 1500

## 2015-05-19 MED ORDER — DEXTROSE 5 % IV SOLN
1.0000 g | INTRAVENOUS | Status: DC
  Filled 2015-05-19 (×2): qty 1

## 2015-05-19 MED ORDER — DEXTROSE 5 % IV SOLN
2.0000 g | Freq: Once | INTRAVENOUS | Status: AC
  Administered 2015-05-19: 2 g via INTRAVENOUS
  Filled 2015-05-19: qty 2

## 2015-05-19 MED ORDER — SODIUM CHLORIDE 0.9 % IV BOLUS (SEPSIS): 1000.0000 mL | Freq: Once | INTRAVENOUS | Status: DC

## 2015-05-19 MED ORDER — SODIUM CHLORIDE 0.9 % IV SOLN
1000.0000 mL | INTRAVENOUS | Status: DC
  Administered 2015-05-19: 1000 mL via INTRAVENOUS

## 2015-05-19 NOTE — ED Notes (Signed)
Pt to department from [facility]- pt was recently dc'ed from [facility] for a fall to independent living. Pt has been on the floor since Saturday on her right side. Facial swelling noted, and bilateral lower leg swelling. Pt is A&O on arrival. Bp- 140/88 Hr-80 RR-16 Given 5mg  albuterol given, wheezing noted. 24g Left hand. CBG-140.

## 2015-05-19 NOTE — ED Provider Notes (Signed)
CSN: [id number]     Arrival date & time 05/19/15  1640 History   First MD Initiated Contact with Patient 05/19/15 1658     Chief Complaint  Patient presents with  . Fall      HPI 82 y.o. female with history of HTN DM 2 and recent hospitalization for pneumonia and cellulitis with discharged on 05/05/2015 who presents after being found down at her assisted living facility. The patient reports that she fell 4 days ago and "I was too embarrassed to call for help." She was down on the ground for a total of 4 days. She states that the fall was mechanical and that she did not lose consciousness. Denies hitting her head. Endorses some shortness of breath but denies chest pain, abdominal pain, dysuria, frequency, fevers, or cough. Complains of pain in her right elbow. No new neurologic deficits.  Past Medical History  Diagnosis Date  . Back pain   . Hypertension   . Carotid stenosis   . Colitis   . Diabetes mellitus without complication ([place])   . Pneumonia    Past Surgical History  Procedure Laterality Date  . Carotid endarterectomy    . Lumbar laminectomy for epidural abscess N/A 10/16/2013    Procedure: LUMBAR LAMINECTOMY FOR EPIDURAL ABSCESS Lumbar Two through Four;  Surgeon: Charlie Pitter, MD;  Location: [facility];  Service: Neurosurgery;  Laterality: N/A;  . Hip arthroplasty Right 01/08/2015    Procedure: RIGHT ANTERIOR APPROACH HEMI HIP ARTHROPLASTY;  Surgeon: Rod Can, MD;  Location: [facility];  Service: Orthopedics;  Laterality: Right;   Family History  Problem Relation Age of Onset  . Leukemia Mother   . Jaundice Father    Social History  Substance Use Topics  . Smoking status: Current Every Day Smoker -- 0.20 packs/day  . Smokeless tobacco: None  . Alcohol Use: No   OB History    No data available     Review of Systems  Constitutional: Negative for fever and chills.  HENT: Positive for facial swelling (diffuse body edema, worse on the R side). Negative for  congestion, rhinorrhea and sore throat.   Eyes: Negative for visual disturbance.  Respiratory: Negative for cough, shortness of breath and wheezing.   Cardiovascular: Positive for leg swelling (3+ bilateral LE edema). Negative for chest pain and palpitations.  Gastrointestinal: Negative for nausea, vomiting, abdominal pain, diarrhea, constipation, blood in stool and abdominal distention.  Genitourinary: Negative for dysuria, frequency and flank pain.  Musculoskeletal: Positive for arthralgias (R elbow). Negative for myalgias, back pain, joint swelling, gait problem, neck pain and neck stiffness.  Skin: Negative for rash.       Erythema to the bilateral LEs  Neurological: Negative for dizziness, tremors, syncope, facial asymmetry, speech difficulty, weakness, numbness and headaches.  Psychiatric/Behavioral: Negative for behavioral problems, confusion and agitation.      Allergies  Peanut-containing drug products  Home Medications   Prior to Admission medications   Medication Sig Start Date End Date Taking? Authorizing Provider  acetaminophen (TYLENOL) 500 MG tablet Take 500 mg by mouth every 6 (six) hours as needed for moderate pain.     Historical Provider, MD  acidophilus (RISAQUAD) CAPS capsule Take 1 capsule by mouth daily. 12/30/14   Florencia Reasons, MD  aspirin EC 325 MG EC tablet Take 1 tablet (325 mg total) by mouth 2 (two) times daily after a meal. 01/09/15   Theodis Blaze, MD  BuPROPion HCl ER, XL, 450 MG TB24 Take 450 mg  by mouth daily.    Historical Provider, MD  carvedilol (COREG) 6.25 MG tablet Take 1 tablet (6.25 mg total) by mouth 2 (two) times daily with a meal. 12/30/14   Florencia Reasons, MD  cefUROXime (CEFTIN) 500 MG tablet Take 1 tablet (500 mg total) by mouth 2 (two) times daily with a meal. 05/05/15   Geradine Girt, DO  cephALEXin (KEFLEX) 500 MG capsule Take 1 capsule (500 mg total) by mouth 2 (two) times daily. 12/30/14   Florencia Reasons, MD  cholecalciferol (VITAMIN D) 1000 UNITS tablet  Take 2,000 Units by mouth daily.     Historical Provider, MD  furosemide (LASIX) 40 MG tablet Take 1 tablet (40 mg total) by mouth daily. 12/30/14   Florencia Reasons, MD  gabapentin (NEURONTIN) 300 MG capsule Take 1 capsule (300 mg total) by mouth at bedtime. 10/21/13   Bobby Rumpf York, PA-C  guaiFENesin-dextromethorphan (ROBITUSSIN DM) 100-10 MG/5ML syrup Take 10 mLs by mouth every 6 (six) hours as needed for cough.    Historical Provider, MD  insulin detemir (LEVEMIR) 100 UNIT/ML injection Inject 0.07 mLs (7 Units total) into the skin 2 (two) times daily. 05/05/15   Geradine Girt, DO  liver oil-zinc oxide (DESITIN) 40 % ointment Apply 1 application topically as needed for irritation.    Historical Provider, MD  lovastatin (MEVACOR) 20 MG tablet Take 20 mg by mouth at bedtime.    Historical Provider, MD  ondansetron (ZOFRAN) 4 MG tablet Take 4 mg by mouth every 6 (six) hours as needed for nausea or vomiting.    Historical Provider, MD  potassium chloride SA (K-DUR,KLOR-CON) 20 MEQ tablet Take 2 tablets (40 mEq total) by mouth daily. 12/30/14   Florencia Reasons, MD  ranitidine (ZANTAC) 300 MG tablet Take 300 mg by mouth daily as needed for heartburn.     Historical Provider, MD  senna-docusate (SENOKOT S) 8.6-50 MG tablet Take 2 tablets by mouth daily as needed for mild constipation.    Historical Provider, MD  ursodiol (ACTIGALL) 300 MG capsule Take 300 mg by mouth 2 (two) times daily.     Historical Provider, MD   BP 127/71 mmHg  Pulse 81  Resp 22  SpO2 99% Physical Exam  Constitutional: She is oriented to person, place, and time. She appears well-developed and well-nourished. No distress.  Diffuse edema, worse on the R side of the body  HENT:  Head: Normocephalic and atraumatic.  Right Ear: External ear normal.  Left Ear: External ear normal.  Nose: Nose normal.  Mouth/Throat: Oropharynx is clear and moist. No oropharyngeal exudate.  Eyes: Conjunctivae and EOM are normal. Pupils are equal, round, and  reactive to light. Right eye exhibits no discharge. Left eye exhibits no discharge.  Neck: Normal range of motion. Neck supple.  No midline TTP over the cervical spine  Cardiovascular: Normal rate, regular rhythm and normal heart sounds.  Exam reveals no gallop and no friction rub.   No murmur heard. Pulmonary/Chest: Breath sounds normal. No respiratory distress. She has no wheezes. She has no rales.  Abdominal: Soft. Bowel sounds are normal. She exhibits no distension and no mass. There is no tenderness. There is no rebound and no guarding.  Musculoskeletal: Normal range of motion. She exhibits edema (edema to all 4 extremities) and tenderness (TTP over the R elbow, with pain with flexion and extension. No palpable effusion).  Neurological: She is alert and oriented to person, place, and time. She exhibits normal muscle tone.  5/5 strength in  the bilateral UEs, 5/5 strength in the LLE, 4/5 strength in the RLE (pt reports is chronic, intact sensation to light touch),   Skin: Skin is warm and dry. No rash noted. She is not diaphoretic.  Erythema to the bilateral LEs. Large intact bulla to the R lateral calf. Stage II ulcer to the L heel, green discharge noted.  Psychiatric: She has a normal mood and affect. Her behavior is normal. Judgment and thought content normal.    ED Course  Procedures  A 20-gauge IV was placed in the patient's right external jugular vein by myself after multiple failed peripheral IV attempts by nursing staff. Skin was prepped with chlorhexidine patient placed in reverse Trendelenburg, EJ palpated and line placed without difficulty. Line drew back and flushed easily. Patient tolerated the procedure well.  Labs Review Labs Reviewed  CBC WITH DIFFERENTIAL/PLATELET  COMPREHENSIVE METABOLIC PANEL  URINALYSIS, ROUTINE W REFLEX MICROSCOPIC (NOT AT [facility])  CK  I-STAT TROPOININ, ED  I-STAT CG4 LACTIC ACID, ED    Imaging Review No results found. I have personally reviewed  and evaluated these images and lab results as part of my medical decision-making.   EKG Interpretation   Date/Time:  Tuesday May 19 2015 16:44:49 EDT Ventricular Rate:  82 PR Interval:    QRS Duration: 107 QT Interval:  411 QTC Calculation: 480 R Axis:   44 Text Interpretation:  indeterminate Low voltage, extremity and precordial  leads Non-specific ST-t changes Abnormal ekg Confirmed by Christy Gentles  MD,  Elenore Rota ([id number]) on 05/19/2015 4:47:36 PM      MDM   Final diagnoses:  Elbow joint pain, right    Patient is alert and oriented 4 though slightly confused. CT head without acute intracranial abnormalities. Patient denies neck pain and there is no tenderness to palpation of the C-spine or back. She is noted to be diffusely edematous, worse on the right side of her body where she was laying.  Chest x-ray with large right pleural effusion, likely loculated. Patient noted to be hypothermic with rectal temperature of 94. Sepsis protocol initiated with blood and urine cultures ordered. Given recent hospitalization, will treat empirically for HCAP with vanc and cefepime. Pt continues to have normal oxygen saturations on 2 L nasal cannula and is in no respiratory distress. No acute surgical intervention required at this time.  CK elevated to 409. Creatinine elevated to 1.6. 30 mL/kg IV rehydration administered per sepsis protocol with resolution of lactic acidosis. Suspect elevated CK in the setting of being down for 4 days.  Patient denies chest pain and troponin is negative. EKG with no findings to suggest cardiac syncope. X-ray of the right elbow with no acute fracture or malalignment.   Plan to admit the patient to the medical service for further treatment of her pneumonia with pleural effusion.    Jenifer Algernon Huxley, MD 05/20/15 0020  Ripley Fraise, MD 05/20/15 2239

## 2015-05-19 NOTE — ED Notes (Signed)
Patient transported to CT scan . 

## 2015-05-19 NOTE — ED Provider Notes (Signed)
Patient seen/examined in the Emergency Department in conjunction with Resident Physician Provider [place] Patient reports she fell several days ago and has been lying on floor ever since Exam : awake/alert, she appears disheveled but she is appropriate.   Plan: imaging/labs pending at this time    Ripley Fraise, MD 05/19/15 1746

## 2015-05-19 NOTE — ED Provider Notes (Signed)
Sepsis - Repeat Assessment  Performed at:    7:51 PM   Vitals     Blood pressure 90/60, pulse 80, temperature 94.5 F (34.7 C), temperature source Rectal, resp. rate 13, SpO2 96 %.  Heart:     Regular rate and rhythm  Lungs:    R[place]  Capillary Refill:   <2 sec  Peripheral Pulse:   Radial pulse palpable  Skin:     Normal Color    Ripley Fraise, MD 05/19/15 1952

## 2015-05-19 NOTE — Progress Notes (Signed)
Pharmacy Antibiotic Note  Antinique Przybyszewski is a 82 y.o. female admitted on 05/19/2015 with pneumonia/Sepsis.  Pharmacy has been consulted for Cefepime and Vancomycin dosing. WBC 9.3, afebrile, LA 2.42. eCrCl ~ 22 mL/min.   Plan: Vancomycin 1500mg  x1 in the ED  Vancomycin 1g IV Q24h  Cefepime 1g IV Q24h  F/U c/s, renal fxn, LOT, VT @ss  Goal vanc trough 15-20   Temp (24hrs), Avg:94.5 F (34.7 C), Min:94.5 F (34.7 C), Max:94.5 F (34.7 C)   Recent Labs Lab 05/19/15 1741 05/19/15 1750  WBC 9.3  --   CREATININE 1.62*  --   LATICACIDVEN  --  2.42*    CrCl cannot be calculated (Unknown ideal weight.).    Allergies  Allergen Reactions  . Peanut-Containing Drug Products Nausea And Vomiting    Antimicrobials this admission: 3/14 Vanc>> 3/14 Zosyn>>   Thank you for allowing pharmacy to be a part of this patient's care.  Kynesha Guerin C. Lennox Grumbles, PharmD Pharmacy Resident  Pager: [phone] 05/19/2015 7:02 PM  Pharmacy Code Sepsis Protocol  Time of code sepsis page: 1903 [x]  Antibiotics delivered at 1915  Were antibiotics ordered at the time of the code sepsis page? Yes Was it required to contact the physician? [x]  Physician not contacted []  Physician contacted to order antibiotics for code sepsis []  Physician contacted to recommend changing antibiotics  Pharmacy consulted for: Vancomycin and Cefepime  Anti-infectives    Start     Dose/Rate Route Frequency Ordered Stop   05/19/15 1900  ceFEPIme (MAXIPIME) 2 g in dextrose 5 % 50 mL IVPB     2 g 100 mL/hr over 30 Minutes Intravenous  Once 05/19/15 1855     05/19/15 1900  vancomycin (VANCOCIN) IVPB 1000 mg/200 mL premix     1,000 mg 200 mL/hr over 60 Minutes Intravenous  Once 05/19/15 1855          Nurse education provided: [x]  Minutes left to administer antibiotics to achieve 1 hour goal [x]  Correct order of antibiotic administration [x]  Antibiotic Y-site compatibilities     Manas Hickling C. Lennox Grumbles, PharmD Pharmacy Resident   Pager: [phone] 05/19/2015 7:04 PM

## 2015-05-19 NOTE — ED Notes (Signed)
Bair Hugger placed to patient.

## 2015-05-19 NOTE — ED Notes (Signed)
Provider at the bedside.  

## 2015-05-20 ENCOUNTER — Inpatient Hospital Stay (HOSPITAL_COMMUNITY): Payer: Medicare HMO

## 2015-05-20 ENCOUNTER — Encounter (HOSPITAL_COMMUNITY): Payer: Self-pay | Admitting: Internal Medicine

## 2015-05-20 DIAGNOSIS — I248 Other forms of acute ischemic heart disease: Secondary | ICD-10-CM | POA: Insufficient documentation

## 2015-05-20 DIAGNOSIS — Z9689 Presence of other specified functional implants: Secondary | ICD-10-CM | POA: Insufficient documentation

## 2015-05-20 DIAGNOSIS — J9601 Acute respiratory failure with hypoxia: Secondary | ICD-10-CM | POA: Insufficient documentation

## 2015-05-20 DIAGNOSIS — Z515 Encounter for palliative care: Secondary | ICD-10-CM | POA: Insufficient documentation

## 2015-05-20 DIAGNOSIS — M7989 Other specified soft tissue disorders: Secondary | ICD-10-CM

## 2015-05-20 DIAGNOSIS — A419 Sepsis, unspecified organism: Secondary | ICD-10-CM

## 2015-05-20 DIAGNOSIS — J90 Pleural effusion, not elsewhere classified: Secondary | ICD-10-CM

## 2015-05-20 DIAGNOSIS — Z7189 Other specified counseling: Secondary | ICD-10-CM | POA: Insufficient documentation

## 2015-05-20 DIAGNOSIS — J9690 Respiratory failure, unspecified, unspecified whether with hypoxia or hypercapnia: Secondary | ICD-10-CM | POA: Diagnosis present

## 2015-05-20 DIAGNOSIS — Z452 Encounter for adjustment and management of vascular access device: Secondary | ICD-10-CM | POA: Insufficient documentation

## 2015-05-20 DIAGNOSIS — G9340 Encephalopathy, unspecified: Secondary | ICD-10-CM | POA: Diagnosis present

## 2015-05-20 LAB — GLUCOSE, CAPILLARY
GLUCOSE-CAPILLARY: 117 mg/dL — AB (ref 65–99)
GLUCOSE-CAPILLARY: 117 mg/dL — AB (ref 65–99)
GLUCOSE-CAPILLARY: 150 mg/dL — AB (ref 65–99)
Glucose-Capillary: 111 mg/dL — ABNORMAL HIGH (ref 65–99)

## 2015-05-20 LAB — CBC WITH DIFFERENTIAL/PLATELET
Basophils Absolute: 0 10*3/uL (ref 0.0–0.1)
Basophils Relative: 0 %
EOS ABS: 0 10*3/uL (ref 0.0–0.7)
EOS PCT: 0 %
HCT: 27.1 % — ABNORMAL LOW (ref 36.0–46.0)
HEMOGLOBIN: 9 g/dL — AB (ref 12.0–15.0)
LYMPHS ABS: 1.2 10*3/uL (ref 0.7–4.0)
Lymphocytes Relative: 15 %
MCH: 32.6 pg (ref 26.0–34.0)
MCHC: 33.2 g/dL (ref 30.0–36.0)
MCV: 98.2 fL (ref 78.0–100.0)
MONOS PCT: 12 %
Monocytes Absolute: 1 10*3/uL (ref 0.1–1.0)
Neutro Abs: 5.8 10*3/uL (ref 1.7–7.7)
Neutrophils Relative %: 73 %
PLATELETS: 229 10*3/uL (ref 150–400)
RBC: 2.76 MIL/uL — ABNORMAL LOW (ref 3.87–5.11)
RDW: 18.8 % — ABNORMAL HIGH (ref 11.5–15.5)
WBC: 8 10*3/uL (ref 4.0–10.5)

## 2015-05-20 LAB — COMPREHENSIVE METABOLIC PANEL
ALK PHOS: 185 U/L — AB (ref 38–126)
ALT: 27 U/L (ref 14–54)
ANION GAP: 10 (ref 5–15)
AST: 78 U/L — ABNORMAL HIGH (ref 15–41)
Albumin: 1.6 g/dL — ABNORMAL LOW (ref 3.5–5.0)
BUN: 45 mg/dL — ABNORMAL HIGH (ref 6–20)
CO2: 22 mmol/L (ref 22–32)
Calcium: 8.8 mg/dL — ABNORMAL LOW (ref 8.9–10.3)
Chloride: 113 mmol/L — ABNORMAL HIGH (ref 101–111)
Creatinine, Ser: 1.55 mg/dL — ABNORMAL HIGH (ref 0.44–1.00)
GFR calc non Af Amer: 31 mL/min — ABNORMAL LOW (ref >60)
GFR, EST AFRICAN AMERICAN: 36 mL/min — AB (ref >60)
GLUCOSE: 83 mg/dL (ref 65–99)
POTASSIUM: 4.2 mmol/L (ref 3.5–5.1)
SODIUM: 145 mmol/L (ref 135–145)
TOTAL PROTEIN: 5.7 g/dL — AB (ref 6.5–8.1)
Total Bilirubin: 1.7 mg/dL — ABNORMAL HIGH (ref 0.3–1.2)

## 2015-05-20 LAB — POCT I-STAT 3, ART BLOOD GAS (G3+)
Acid-base deficit: 5 mmol/L — ABNORMAL HIGH (ref 0.0–2.0)
Bicarbonate: 20.9 mEq/L (ref 20.0–24.0)
O2 Saturation: 98 %
PCO2 ART: 40.9 mmHg (ref 35.0–45.0)
PH ART: 7.317 — AB (ref 7.350–7.450)
TCO2: 22 mmol/L (ref 0–100)
pO2, Arterial: 108 mmHg — ABNORMAL HIGH (ref 80.0–100.0)

## 2015-05-20 LAB — TROPONIN I
TROPONIN I: 0.42 ng/mL — AB (ref <0.031)
Troponin I: 0.13 ng/mL — ABNORMAL HIGH (ref <0.031)
Troponin I: 0.4 ng/mL — ABNORMAL HIGH (ref <0.031)

## 2015-05-20 LAB — PROTEIN, TOTAL: TOTAL PROTEIN: 6 g/dL — AB (ref 6.5–8.1)

## 2015-05-20 LAB — CK: CK TOTAL: 292 U/L — AB (ref 38–234)

## 2015-05-20 LAB — MRSA PCR SCREENING: MRSA BY PCR: NEGATIVE

## 2015-05-20 LAB — MAGNESIUM: Magnesium: 1.8 mg/dL (ref 1.7–2.4)

## 2015-05-20 LAB — PROTIME-INR
INR: 1.17 (ref 0.00–1.49)
PROTHROMBIN TIME: 15.1 s (ref 11.6–15.2)

## 2015-05-20 LAB — AMMONIA: Ammonia: 67 umol/L — ABNORMAL HIGH (ref 9–35)

## 2015-05-20 LAB — TRIGLYCERIDES: Triglycerides: 110 mg/dL (ref <150)

## 2015-05-20 LAB — LACTIC ACID, PLASMA
LACTIC ACID, VENOUS: 1.7 mmol/L (ref 0.5–2.0)
Lactic Acid, Venous: 1.8 mmol/L (ref 0.5–2.0)

## 2015-05-20 LAB — TSH: TSH: 3.164 u[IU]/mL (ref 0.350–4.500)

## 2015-05-20 LAB — PHOSPHORUS: Phosphorus: 4.8 mg/dL — ABNORMAL HIGH (ref 2.5–4.6)

## 2015-05-20 LAB — CBG MONITORING, ED: Glucose-Capillary: 72 mg/dL (ref 65–99)

## 2015-05-20 LAB — CORTISOL: Cortisol, Plasma: 29.7 ug/dL

## 2015-05-20 LAB — LACTATE DEHYDROGENASE: LDH: 217 U/L — AB (ref 98–192)

## 2015-05-20 LAB — PROCALCITONIN: PROCALCITONIN: 0.31 ng/mL

## 2015-05-20 LAB — APTT: aPTT: 32 seconds (ref 24–37)

## 2015-05-20 MED ORDER — FAMOTIDINE 20 MG PO TABS
20.0000 mg | ORAL_TABLET | Freq: Two times a day (BID) | ORAL | Status: DC
Start: 2015-05-20 — End: 2015-05-21
  Administered 2015-05-20 – 2015-05-21 (×2): 20 mg
  Filled 2015-05-20: qty 1

## 2015-05-20 MED ORDER — MIDAZOLAM HCL 2 MG/2ML IJ SOLN
4.0000 mg | Freq: Once | INTRAMUSCULAR | Status: AC
  Administered 2015-05-20: 4 mg via INTRAVENOUS

## 2015-05-20 MED ORDER — PROPOFOL 1000 MG/100ML IV EMUL
0.0000 ug/kg/min | INTRAVENOUS | Status: DC
  Administered 2015-05-20: 5 ug/kg/min via INTRAVENOUS
  Administered 2015-05-21: 15 ug/kg/min via INTRAVENOUS
  Filled 2015-05-20 (×2): qty 100

## 2015-05-20 MED ORDER — DEXTROSE 5 % IV SOLN: 2.0000 g | Freq: Once | INTRAVENOUS | Status: DC

## 2015-05-20 MED ORDER — VANCOMYCIN HCL IN DEXTROSE 750-5 MG/150ML-% IV SOLN
750.0000 mg | INTRAVENOUS | Status: DC
  Administered 2015-05-20 – 2015-05-22 (×3): 750 mg via INTRAVENOUS
  Filled 2015-05-20 (×5): qty 150

## 2015-05-20 MED ORDER — ANTISEPTIC ORAL RINSE SOLUTION (CORINZ)
7.0000 mL | OROMUCOSAL | Status: DC
  Administered 2015-05-20 – 2015-05-25 (×49): 7 mL via OROMUCOSAL

## 2015-05-20 MED ORDER — ASPIRIN 325 MG PO TABS
325.0000 mg | ORAL_TABLET | Freq: Once | ORAL | Status: AC
  Administered 2015-05-20: 325 mg
  Filled 2015-05-20: qty 1

## 2015-05-20 MED ORDER — PROPOFOL 1000 MG/100ML IV EMUL
INTRAVENOUS | Status: AC
  Administered 2015-05-20: 5 ug/kg/min via INTRAVENOUS
  Filled 2015-05-20: qty 100

## 2015-05-20 MED ORDER — FENTANYL CITRATE (PF) 100 MCG/2ML IJ SOLN
50.0000 ug | INTRAMUSCULAR | Status: DC | PRN
  Administered 2015-05-21 – 2015-05-25 (×13): 50 ug via INTRAVENOUS
  Filled 2015-05-20 (×13): qty 2

## 2015-05-20 MED ORDER — LACTULOSE 10 GM/15ML PO SOLN
10.0000 g | Freq: Three times a day (TID) | ORAL | Status: DC
  Administered 2015-05-20: 10 g via ORAL
  Filled 2015-05-20: qty 15

## 2015-05-20 MED ORDER — LACTULOSE ENEMA
300.0000 mL | Freq: Once | ORAL | Status: AC
  Administered 2015-05-20: 300 mL via RECTAL
  Filled 2015-05-20: qty 300

## 2015-05-20 MED ORDER — SODIUM CHLORIDE 0.9 % IV SOLN
Freq: Once | INTRAVENOUS | Status: AC
  Administered 2015-05-20: 1000 mL via INTRAVENOUS

## 2015-05-20 MED ORDER — FAMOTIDINE 20 MG PO TABS
20.0000 mg | ORAL_TABLET | Freq: Two times a day (BID) | ORAL | Status: DC
  Filled 2015-05-20: qty 1

## 2015-05-20 MED ORDER — LIDOCAINE HCL (PF) 1 % IJ SOLN
INTRAMUSCULAR | Status: AC
  Filled 2015-05-20: qty 5

## 2015-05-20 MED ORDER — ASPIRIN EC 325 MG PO TBEC
325.0000 mg | DELAYED_RELEASE_TABLET | Freq: Two times a day (BID) | ORAL | Status: DC
  Filled 2015-05-20: qty 1

## 2015-05-20 MED ORDER — IPRATROPIUM-ALBUTEROL 0.5-2.5 (3) MG/3ML IN SOLN
3.0000 mL | Freq: Four times a day (QID) | RESPIRATORY_TRACT | Status: DC
  Administered 2015-05-20 – 2015-05-21 (×2): 3 mL via RESPIRATORY_TRACT
  Filled 2015-05-20 (×2): qty 3

## 2015-05-20 MED ORDER — PANTOPRAZOLE SODIUM 40 MG IV SOLR: 40.0000 mg | INTRAVENOUS | Status: DC

## 2015-05-20 MED ORDER — LACTULOSE 10 GM/15ML PO SOLN
10.0000 g | Freq: Three times a day (TID) | ORAL | Status: DC
  Administered 2015-05-20 – 2015-05-21 (×4): 10 g
  Filled 2015-05-20 (×4): qty 15

## 2015-05-20 MED ORDER — BUDESONIDE 0.5 MG/2ML IN SUSP
0.5000 mg | Freq: Two times a day (BID) | RESPIRATORY_TRACT | Status: DC
  Administered 2015-05-20 – 2015-05-21 (×2): 0.5 mg via RESPIRATORY_TRACT
  Filled 2015-05-20 (×2): qty 2

## 2015-05-20 MED ORDER — DEXTROSE 5 % IV SOLN
1.0000 g | INTRAVENOUS | Status: AC
  Administered 2015-05-20 – 2015-05-27 (×8): 1 g via INTRAVENOUS
  Filled 2015-05-20 (×11): qty 1

## 2015-05-20 MED ORDER — SODIUM CHLORIDE 0.9 % IV SOLN: 250.0000 mL | INTRAVENOUS | Status: DC | PRN

## 2015-05-20 MED ORDER — NOREPINEPHRINE BITARTRATE 1 MG/ML IV SOLN
0.0000 ug/min | INTRAVENOUS | Status: DC
  Administered 2015-05-20: 2 ug/min via INTRAVENOUS
  Administered 2015-05-21: 5 ug/min via INTRAVENOUS
  Filled 2015-05-20 (×3): qty 16

## 2015-05-20 MED ORDER — URSODIOL 300 MG PO CAPS
300.0000 mg | ORAL_CAPSULE | Freq: Two times a day (BID) | ORAL | Status: DC
  Administered 2015-05-20 – 2015-05-21 (×2): 300 mg
  Filled 2015-05-20 (×3): qty 1

## 2015-05-20 MED ORDER — ASPIRIN 325 MG PO TABS
325.0000 mg | ORAL_TABLET | Freq: Two times a day (BID) | ORAL | Status: DC
  Administered 2015-05-21: 325 mg
  Filled 2015-05-20: qty 1

## 2015-05-20 MED ORDER — PRAVASTATIN SODIUM 40 MG PO TABS
20.0000 mg | ORAL_TABLET | Freq: Every day | ORAL | Status: DC
  Filled 2015-05-20: qty 1

## 2015-05-20 MED ORDER — ENOXAPARIN SODIUM 30 MG/0.3ML ~~LOC~~ SOLN
30.0000 mg | SUBCUTANEOUS | Status: DC
  Administered 2015-05-20 – 2015-05-23 (×4): 30 mg via SUBCUTANEOUS
  Filled 2015-05-20 (×4): qty 0.3

## 2015-05-20 MED ORDER — INSULIN DETEMIR 100 UNIT/ML ~~LOC~~ SOLN
5.0000 [IU] | Freq: Every day | SUBCUTANEOUS | Status: DC
  Administered 2015-05-21 – 2015-05-24 (×4): 5 [IU] via SUBCUTANEOUS
  Filled 2015-05-20 (×6): qty 0.05

## 2015-05-20 MED ORDER — ETOMIDATE 2 MG/ML IV SOLN
20.0000 mg | Freq: Once | INTRAVENOUS | Status: AC
  Administered 2015-05-20: 20 mg via INTRAVENOUS

## 2015-05-20 MED ORDER — CHLORHEXIDINE GLUCONATE 0.12% ORAL RINSE (MEDLINE KIT)
15.0000 mL | Freq: Two times a day (BID) | OROMUCOSAL | Status: DC
  Administered 2015-05-20: 15 mL via OROMUCOSAL

## 2015-05-20 MED ORDER — PRAVASTATIN SODIUM 40 MG PO TABS: 20.0000 mg | ORAL_TABLET | Freq: Every day | ORAL | Status: DC

## 2015-05-20 MED ORDER — FENTANYL CITRATE (PF) 100 MCG/2ML IJ SOLN: 50.0000 ug | INTRAMUSCULAR | Status: DC | PRN

## 2015-05-20 MED ORDER — CARVEDILOL 6.25 MG PO TABS: 6.2500 mg | ORAL_TABLET | Freq: Two times a day (BID) | ORAL | Status: DC

## 2015-05-20 MED ORDER — VANCOMYCIN HCL IN DEXTROSE 1-5 GM/200ML-% IV SOLN: 1000.0000 mg | Freq: Once | INTRAVENOUS | Status: DC

## 2015-05-20 MED ORDER — URSODIOL 300 MG PO CAPS
300.0000 mg | ORAL_CAPSULE | Freq: Two times a day (BID) | ORAL | Status: DC
  Filled 2015-05-20 (×2): qty 1

## 2015-05-20 MED ORDER — SODIUM CHLORIDE 0.9 % IV SOLN
INTRAVENOUS | Status: DC
  Administered 2015-05-20 (×2): via INTRAVENOUS

## 2015-05-20 MED ORDER — ANTISEPTIC ORAL RINSE SOLUTION (CORINZ): 7.0000 mL | Freq: Four times a day (QID) | OROMUCOSAL | Status: DC

## 2015-05-20 MED ORDER — LIDOCAINE HCL (PF) 1 % IJ SOLN
INTRAMUSCULAR | Status: AC
  Administered 2015-05-20: 18:00:00
  Filled 2015-05-20: qty 5

## 2015-05-20 MED ORDER — INSULIN ASPART 100 UNIT/ML ~~LOC~~ SOLN: 0.0000 [IU] | Freq: Three times a day (TID) | SUBCUTANEOUS | Status: DC

## 2015-05-20 MED ORDER — CHLORHEXIDINE GLUCONATE 0.12% ORAL RINSE (MEDLINE KIT)
15.0000 mL | Freq: Two times a day (BID) | OROMUCOSAL | Status: DC
  Administered 2015-05-21 – 2015-05-25 (×8): 15 mL via OROMUCOSAL

## 2015-05-20 MED ORDER — FENTANYL CITRATE (PF) 100 MCG/2ML IJ SOLN
200.0000 ug | Freq: Once | INTRAMUSCULAR | Status: AC
  Administered 2015-05-20: 200 ug via INTRAVENOUS

## 2015-05-20 MED ORDER — BUPROPION HCL ER (XL) 150 MG PO TB24
450.0000 mg | ORAL_TABLET | Freq: Every day | ORAL | Status: DC
  Administered 2015-05-21: 450 mg via ORAL
  Filled 2015-05-20 (×3): qty 3

## 2015-05-20 NOTE — Procedures (Signed)
ELECTROENCEPHALOGRAM REPORT  Date of Study: 05/20/2015  Patient's Name: Tamara Stephenson MRN: [id number] Date of Birth: 07-Oct-1933  Referring Provider: Dr. Gean Birchwood  Clinical History: This is a 82 year old woman with confusion and fall.  Medications: aspirin EC tablet 325 mg buPROPion (WELLBUTRIN XL) 24 hr tablet 450 mg carvedilol (COREG) tablet 6.25 mg ceFEPIme (MAXIPIME) 1 g in dextrose 5 % 50 mL IVPB enoxaparin (LOVENOX) injection 30 mg famotidine (PEPCID) tablet 20 mg insulin aspart (novoLOG) injection 0-9 Units insulin detemir (LEVEMIR) injection 5 Units lactulose (CHRONULAC) enema 200 gm pravastatin (PRAVACHOL) tablet 20 mg ursodiol (ACTIGALL) capsule 300 mg vancomycin (VANCOCIN) IVPB 750 mg/150 ml premix  Technical Summary: A multichannel digital EEG recording measured by the international 10-20 system with electrodes applied with paste and impedances below 5000 ohms performed as portable with EKG monitoring in an awake and asleep patient.  Hyperventilation and photic stimulation were not performed.  The digital EEG was referentially recorded, reformatted, and digitally filtered in a variety of bipolar and referential montages for optimal display.   Description: The patient is awake and asleep during the recording. She is noted to be confused. There is no clear posterior dominant rhythm. The background consists of a large amount of diffuse 4-6 Hz theta and occasional 2-3 Hz delta slowing.  During drowsiness and sleep, there is an increase in theta slowing of the background.  Vertex waves and symmetric sleep spindles were seen.  Hyperventilation and photic stimulation were not performed. Patient is noted to have twitching of the upper body, more on the left shoulder, with no EEG correlate seen.  There were no epileptiform discharges or electrographic seizures seen.    EKG lead was unremarkable.  Impression: This awake and asleep EEG is abnormal due to moderate diffuse  slowing of the waking background.  Clinical Correlation of the above findings indicates diffuse cerebral dysfunction that is non-specific in etiology and can be seen with hypoxic/ischemic injury, toxic/metabolic encephalopathies, neurodegenerative disorders, or medication effect. The absence of epileptiform discharges does not rule out a clinical diagnosis of epilepsy. Upper body and left shoulder twitching did not show electrographic correlate.  Clinical correlation is advised.   Ellouise Newer, M.D.

## 2015-05-20 NOTE — H&P (Addendum)
[facility] History and Physical  Tamara Stephenson [id number] DOB: 1933-07-16 DOA: 05/19/2015  Referring physician: ER physician. PCP: Curly Rim, MD  Specialists: None.  Chief Complaint: Was found on the floor at the facility.  HPI: Tamara Stephenson is a 81 y.o. female with history of diabetes mellitus type 2, cirrhosis of the liver, CHF, chronic kidney disease and chronic anemia who was recently discharged from the hospital 2 weeks ago after being treated for pneumonia and lower extremity cellulitis and decubitus ulcers was found on the floor at the assisted living facility. Was not known how long she was on the floor. Patient was brought to the ER and was found to be hypothermic with lactic as elevated patient confused. Chest x-ray shows whitening of the right lung field for which CT scan was recommended. On exam patient appears confused and has swelling of the right upper extremity x-rays of the right humerus and elbow are negative for fracture. In addition patient has left heel decubitus and blebs of the right calf muscle. There is bilateral lower extremity cellulitis. Unable to reach family. CT head is unremarkable. CT chest shows large pleural effusion with ascites in the abdomen. Patient has been admitted for further management of sepsis and acute encephalopathy with large pleural effusion and possible pneumonia.   Review of Systems: As presented in the history of presenting illness, rest negative.  Past Medical History  Diagnosis Date  . Back pain   . Hypertension   . Carotid stenosis   . Colitis   . Diabetes mellitus without complication ([place])   . Pneumonia    Past Surgical History  Procedure Laterality Date  . Carotid endarterectomy    . Lumbar laminectomy for epidural abscess N/A 10/16/2013    Procedure: LUMBAR LAMINECTOMY FOR EPIDURAL ABSCESS Lumbar Two through Four;  Surgeon: Charlie Pitter, MD;  Location: [facility];  Service: Neurosurgery;  Laterality: N/A;  . Hip  arthroplasty Right 01/08/2015    Procedure: RIGHT ANTERIOR APPROACH HEMI HIP ARTHROPLASTY;  Surgeon: Rod Can, MD;  Location: [facility];  Service: Orthopedics;  Laterality: Right;   Social History:  reports that she has been smoking.  She does not have any smokeless tobacco history on file. She reports that she does not drink alcohol or use illicit drugs. Where does patient live Assisted living facility. Can patient participate in ADLs? Not sure.  Allergies  Allergen Reactions  . Peanut-Containing Drug Products Nausea And Vomiting    Family History:  Family History  Problem Relation Age of Onset  . Leukemia Mother   . Jaundice Father       Prior to Admission medications   Medication Sig Start Date End Date Taking? Authorizing Provider  acetaminophen (TYLENOL) 500 MG tablet Take 500 mg by mouth every 6 (six) hours as needed for moderate pain.    Yes Historical Provider, MD  acidophilus (RISAQUAD) CAPS capsule Take 1 capsule by mouth daily. 12/30/14  Yes Florencia Reasons, MD  aspirin EC 325 MG EC tablet Take 1 tablet (325 mg total) by mouth 2 (two) times daily after a meal. 01/09/15  Yes Theodis Blaze, MD  BuPROPion HCl ER, XL, 450 MG TB24 Take 450 mg by mouth daily.   Yes Historical Provider, MD  carvedilol (COREG) 6.25 MG tablet Take 1 tablet (6.25 mg total) by mouth 2 (two) times daily with a meal. 12/30/14  Yes Florencia Reasons, MD  cholecalciferol (VITAMIN D) 1000 UNITS tablet Take 2,000 Units by mouth daily.    Yes  Historical Provider, MD  furosemide (LASIX) 40 MG tablet Take 1 tablet (40 mg total) by mouth daily. 12/30/14  Yes Florencia Reasons, MD  gabapentin (NEURONTIN) 300 MG capsule Take 1 capsule (300 mg total) by mouth at bedtime. 10/21/13  Yes Marianne L York, PA-C  guaiFENesin-dextromethorphan (ROBITUSSIN DM) 100-10 MG/5ML syrup Take 10 mLs by mouth every 6 (six) hours as needed for cough.   Yes Historical Provider, MD  insulin detemir (LEVEMIR) 100 UNIT/ML injection Inject 0.07 mLs (7 Units total)  into the skin 2 (two) times daily. 05/05/15  Yes Geradine Girt, DO  liver oil-zinc oxide (DESITIN) 40 % ointment Apply 1 application topically as needed for irritation.   Yes Historical Provider, MD  lovastatin (MEVACOR) 20 MG tablet Take 20 mg by mouth at bedtime.   Yes Historical Provider, MD  ondansetron (ZOFRAN) 4 MG tablet Take 4 mg by mouth every 6 (six) hours as needed for nausea or vomiting.   Yes Historical Provider, MD  potassium chloride SA (K-DUR,KLOR-CON) 20 MEQ tablet Take 2 tablets (40 mEq total) by mouth daily. 12/30/14  Yes Florencia Reasons, MD  ranitidine (ZANTAC) 300 MG tablet Take 300 mg by mouth daily as needed for heartburn.    Yes Historical Provider, MD  senna-docusate (SENOKOT S) 8.6-50 MG tablet Take 2 tablets by mouth daily.    Yes Historical Provider, MD  ursodiol (ACTIGALL) 300 MG capsule Take 300 mg by mouth 2 (two) times daily.    Yes Historical Provider, MD  cefUROXime (CEFTIN) 500 MG tablet Take 1 tablet (500 mg total) by mouth 2 (two) times daily with a meal. Patient not taking: Reported on 05/19/2015 05/05/15   Geradine Girt, DO  cephALEXin (KEFLEX) 500 MG capsule Take 1 capsule (500 mg total) by mouth 2 (two) times daily. Patient not taking: Reported on 05/19/2015 12/30/14   Florencia Reasons, MD    Physical Exam: Filed Vitals:   05/20/15 0030 05/20/15 0048 05/20/15 0100 05/20/15 0127  BP: 120/54  98/45 102/47  Pulse: 100  97 94  Temp:  98.4 F (36.9 C)    TempSrc:      Resp: 26  25 18   Height:      Weight:      SpO2: 92%  91% 94%     General:  Obese not in distress.  Eyes: Anicteric no pallor.  ENT: No discharge from the ears eyes nose or mouth.  Neck: No mass felt. No JVD appreciated.  Cardiovascular: S1 and S2 heard.  Respiratory: No rhonchi or crepitations.  Abdomen: Soft nontender bowel sounds present.  Skin: There is left heel ulcer and right lower extremity blebs. Bilateral lower extremity cellulitis. Swelling of the right upper  extremity.  Musculoskeletal: Bilateral lower extremity cellulitis with swelling of the lower extremities and right upper extremity swelling.  Psychiatric: Patient is confused.  Neurologic: Patient is confused but moves all extremities.  Labs on Admission:  Basic Metabolic Panel:  Recent Labs Lab 05/19/15 1741  NA 144  K 4.7  CL 112*  CO2 23  GLUCOSE 99  BUN 46*  CREATININE 1.62*  CALCIUM 9.3   Liver Function Tests:  Recent Labs Lab 05/19/15 1741  AST 78*  ALT 28  ALKPHOS 202*  BILITOT 1.7*  PROT 6.2*  ALBUMIN 1.8*   No results for input(s): LIPASE, AMYLASE in the last 168 hours. No results for input(s): AMMONIA in the last 168 hours. CBC:  Recent Labs Lab 05/19/15 1741  WBC 9.3  NEUTROABS 6.9  HGB 11.0*  HCT 33.0*  MCV 99.1  PLT 244   Cardiac Enzymes:  Recent Labs Lab 05/19/15 1741  CKTOTAL 409*    BNP (last 3 results) No results for input(s): BNP in the last 8760 hours.  ProBNP (last 3 results) No results for input(s): PROBNP in the last 8760 hours.  CBG: No results for input(s): GLUCAP in the last 168 hours.  Radiological Exams on Admission: Dg Chest 2 View  05/19/2015  CLINICAL DATA:  Fall 3 days ago, found on right side today. EXAM: CHEST  2 VIEW COMPARISON:  Chest x-ray dated 05/14/2015. FINDINGS: There is now a complete opacification of the right hemi thorax, presumably large pleural effusion. There is an associated slight leftward shift of the mediastinal structures. Left lung is clear. Visualized portions of the cardiomediastinal silhouette appears stable in configuration. No acute- appearing osseous abnormality. Degenerative changes noted at the shoulders. IMPRESSION: Interval development of a complete opacification of the right hemithorax, presumably large pleural effusion. Underlying consolidation/pneumonia cannot be excluded. Recommend chest CT for further characterization. Electronically Signed   By: Franki Cabot M.D.   On: 05/19/2015  18:49   Dg Elbow Complete Right  05/19/2015  CLINICAL DATA:  Fall 3 days ago, right elbow pain EXAM: RIGHT ELBOW - COMPLETE 3+ VIEW COMPARISON:  Right elbow plain film dated 09/15/2014. FINDINGS: Again noted is an old avulsion fracture fragment adjacent to the medial epicondyle. No acute- appearing fracture line or displaced fracture fragment. No significant degenerative change. No evidence of joint effusion. Surrounding soft tissues are unremarkable. IMPRESSION: No acute findings. Electronically Signed   By: Franki Cabot M.D.   On: 05/19/2015 18:45   Ct Head Wo Contrast  05/19/2015  CLINICAL DATA:  Altered mental status. EXAM: CT HEAD WITHOUT CONTRAST TECHNIQUE: Contiguous axial images were obtained from the base of the skull through the vertex without intravenous contrast. COMPARISON:  CT scan of May 09, 2015. FINDINGS: Bilateral maxillary sinusitis is noted. Mild diffuse cortical atrophy is noted. Mild chronic ischemic white matter disease is noted. No mass effect or midline shift is noted. Ventricular size is within normal limits. There is no evidence of mass lesion, hemorrhage or acute infarction. IMPRESSION: Mild diffuse cortical atrophy. Mild chronic ischemic white matter disease. No acute intracranial abnormality seen. Electronically Signed   By: Marijo Conception, M.D.   On: 05/19/2015 18:26   Ct Chest Wo Contrast  05/20/2015  CLINICAL DATA:  Recent fall. Prolonged time on floor, on right side. Facial pain and bilateral lower extremity swelling. Initial encounter. EXAM: CT CHEST WITHOUT CONTRAST TECHNIQUE: Multidetector CT imaging of the chest was performed following the standard protocol without IV contrast. COMPARISON:  Chest radiographs performed earlier today at 6:08 p.m., 05/02/2015, and 01/06/2015 FINDINGS: There is a large right-sided pleural effusion, occupying the right hemithorax, with complete collapse of the right lung. The collapsed right lung appears somewhat irregular. This may  reflect pneumonia. Underlying mass cannot excluded. Patchy left basilar airspace opacity raises concern for pneumonia. No pneumothorax is seen. Diffuse coronary artery calcifications are seen. The mediastinum is grossly unremarkable in appearance, though difficult to fully assess due to collapse of the right lung. The thyroid gland is unremarkable. No definite pericardial effusion is seen. No definite mediastinal lymphadenopathy is appreciated. Diffuse soft tissue edema is noted along the chest wall, more prominent on the left. Moderate volume ascites is seen within the upper abdomen. The nodular appearance of the liver raises concern for cirrhosis. The visualized portions of the  spleen and pancreas are unremarkable. A stone is noted at the base of the gallbladder. No acute osseous abnormalities are seen. Degenerative change is noted at the glenohumeral joints bilaterally. Mild degenerative change is noted along the lower cervical spine, and at the lower thoracic spine. IMPRESSION: 1. Large right-sided pleural effusion occupies the right hemithorax, with complete collapse of the right lung. Somewhat irregular appearance to the collapsed right lung, concerning for pneumonia. Underlying mass cannot be excluded. Diagnostic thoracentesis could be considered for further evaluation. 2. Patchy left basilar airspace opacity also raises concern for pneumonia. 3. Diffuse coronary artery calcifications seen. 4. Diffuse soft tissue edema along the chest wall, more prominent on the left. 5. Moderate volume ascites within the upper abdomen. 6. Findings of hepatic cirrhosis. 7. Cholelithiasis noted. Electronically Signed   By: Garald Balding M.D.   On: 05/20/2015 00:47    EKG: Independently reviewed. Normal sinus rhythm with low voltage.  Assessment/Plan Principal Problem:   Sepsis ([place]) Active Problems:   CKD (chronic kidney disease) stage 3, GFR 30-59 ml/min   Anemia of chronic disease   Diabetes mellitus with renal  manifestations, uncontrolled ([place])   HCAP (healthcare-associated pneumonia)   Dehydration   CHF (congestive heart failure) ([facility])   Pressure ulcer   Acute encephalopathy   1. Sepsis probably from pneumonia and cellulitis - patient has been placed on empiric antibiotics. Follow blood cultures. Follow lactic acid level. Patient has ascites so may have to hold of fluids if blood pressure improves. Patient is hypothermic for which patient is placed on warming blankets. Once the pressure improves this may be discontinued. Check TSH and cortisol levels. 2. Large right-sided pleural effusion - will probably will need thoracentesis. I have consulted critical care. 3. Acute encephalopathy - probably from sepsis. CT head is unremarkable. Ammonia level some mildly elevated for which I have ordered lactulose per rectal. Follow repeat ammonia levels. If patient still does not become alert and awake may need further doses of perirectal lactulose otherwise patient may be given orally. 4. History of CHF - 2-D echo done in August showed EF of 35-40% and cardiology notes in October shows EF was actually 55-60%. Stress Myoview done at that time was unremarkable. Patient may need Lasix only if blood pressure stabilizes. Closely monitor and stepdown. 5. Decubitus ulcer of the left heel and sacrum - wound team consult requested. 6. Right upper extremity swelling from fall - check Dopplers to rule out DVT. 7. Diabetes mellitus type 2 - since patient may not have reliable oral intake I have decreased patient's Levemir and used to 5 units daily. Closely follow CBGs. 8. History of cirrhosis of the liver - patient is on ursodiol. Follow ammonia levels and will need Lasix alone. Patient has ascites may need paracentesis when stable. 9. CAD - stress Myoview in October last year was unremarkable. Cycle cardiac markers.  Once patient is more stable order paracentesis. I have consulted pulmonary critical care. May need  thoracentesis. Unable to reach family. CODE STATUS has to be reconfirmed. For now patient is full code. I have consulted palliative care.   DVT Prophylaxis Lovenox.  Code Status: Full code.  Family Communication: Unable to reach family.  Disposition Plan: Admit to inpatient.    Damar Petit N. [facility] Pager [phone].  If 7PM-7AM, please contact night-coverage [email or web address] Password [email or web address] 05/20/2015, 1:34 AM

## 2015-05-20 NOTE — Progress Notes (Signed)
Utilization Review Completed.  

## 2015-05-20 NOTE — ED Notes (Signed)
Patient transported to X-ray 

## 2015-05-20 NOTE — Progress Notes (Signed)
[facility] PROGRESS NOTE  Tamara Stephenson [id number] DOB: 1933/05/15 DOA: 05/19/2015 PCP: Curly Rim, MD  HPI/Brief narrative 82 y.o. female with history of diabetes mellitus type 2, cirrhosis of the liver, CHF, chronic kidney disease and chronic anemia who was recently discharged from the hospital 2 weeks ago after being treated for pneumonia and lower extremity cellulitis and decubitus ulcers was found on the floor at the assisted living facility.  Assessment/Plan: 1. Sepsis likely secondary to pneumonia vs cellulitis vs gm pos bacteremia - patient was started on empiric vanc and cefepime. Blood cx pos for gm pos organisms. Elevated lactic acid level on admit, improved. Patient presented hypothermic. 2. Large right-sided pleural effusion - Critical Care consulted. Will need thoracentesis, however is unstable for procedure and will need to be intubated. Patient since transferred to ICU for intubation. 3. Acute encephalopathy - likely secondary to sepsis. CT head is unremarkable. Ammonia level on admission mildly elevated, with lactulose ordered. EEG done, findings of slowing. 4. History of CHF - 2-D echo was done in August showed EF of 35-40% and cardiology notes in October shows EF was actually 55-60%. Stress Myoview done at that time was unremarkable. 5. Decubitus ulcer of the left heel and sacrum - wound team consulted. 6. Right upper extremity swelling from fall - LE Dopplers neg for DVT. 7. Diabetes mellitus type 2 - decreased patient's Levemir and used to 5 units daily. Closely follow CBGs. 8. History of cirrhosis of the liver - patient is on ursodiol. 9. CAD - stress Myoview in October last year was unremarkable. Cycle cardiac markers. 10. End of Life - appreciate assistance by Palliative Care. Previous documentation reports DNR wishes as of 11/16. Patient unable to provide good history and no family could be reached, thus patient's code status was empirically listed as Full Code  this admission.  Code Status: Full Family Communication: Pt in room Disposition Plan: Unclear at this time   Consultants:  PCCM  Palliative Care  Procedures:    Antibiotics: Anti-infectives    Start     Dose/Rate Route Frequency Ordered Stop   05/20/15 2000  ceFEPIme (MAXIPIME) 1 g in dextrose 5 % 50 mL IVPB     1 g 100 mL/hr over 30 Minutes Intravenous Every 24 hours 05/20/15 1533     05/20/15 2000  vancomycin (VANCOCIN) IVPB 750 mg/150 ml premix     750 mg 150 mL/hr over 60 Minutes Intravenous Every 24 hours 05/20/15 0157     05/20/15 0145  ceFEPIme (MAXIPIME) 2 g in dextrose 5 % 50 mL IVPB  Status:  Discontinued     2 g 100 mL/hr over 30 Minutes Intravenous  Once 05/20/15 0133 05/20/15 0156   05/20/15 0145  vancomycin (VANCOCIN) IVPB 1000 mg/200 mL premix  Status:  Discontinued     1,000 mg 200 mL/hr over 60 Minutes Intravenous  Once 05/20/15 0133 05/20/15 0156   05/19/15 1915  vancomycin (VANCOCIN) 1,500 mg in sodium chloride 0.9 % 500 mL IVPB     1,500 mg 250 mL/hr over 120 Minutes Intravenous  Once 05/19/15 1914 05/19/15 2151   05/19/15 1915  ceFEPIme (MAXIPIME) 1 g in dextrose 5 % 50 mL IVPB  Status:  Discontinued     1 g 100 mL/hr over 30 Minutes Intravenous Every 24 hours 05/19/15 1914 05/20/15 1533   05/19/15 1900  ceFEPIme (MAXIPIME) 2 g in dextrose 5 % 50 mL IVPB     2 g 100 mL/hr over 30 Minutes Intravenous  Once 05/19/15  1855 05/19/15 2008   05/19/15 1900  vancomycin (VANCOCIN) IVPB 1000 mg/200 mL premix  Status:  Discontinued     1,000 mg 200 mL/hr over 60 Minutes Intravenous  Once 05/19/15 1855 05/19/15 1914      HPI/Subjective: Lethargic, unable to obtain  Objective: Filed Vitals:   05/20/15 0700 05/20/15 1200 05/20/15 1300 05/20/15 1400  BP: 107/62 112/49 110/63 117/59  Pulse:  82    Temp:  96.9 F (36.1 C)    TempSrc:  Axillary    Resp: 18 14 12 13   Height:      Weight:      SpO2: 95% 96% 96% 97%    Intake/Output Summary (Last 24  hours) at 05/20/15 1624 Last data filed at 05/20/15 [id number]  Gross per 24 hour  Intake   3550 ml  Output   1350 ml  Net   2200 ml   Filed Weights   05/19/15 2006 05/20/15 0656  Weight: 68.04 kg (150 lb) 91.4 kg (201 lb 8 oz)    Exam:   General:  Asleep, arousable   Cardiovascular: regular, s1, s2  Respiratory: normal resp effort, no wheezing  Abdomen: soft, nondistended  Musculoskeletal: perfused, no clubbing   Data Reviewed: Basic Metabolic Panel:  Recent Labs Lab 05/19/15 1741 05/20/15 0228  NA 144 145  K 4.7 4.2  CL 112* 113*  CO2 23 22  GLUCOSE 99 83  BUN 46* 45*  CREATININE 1.62* 1.55*  CALCIUM 9.3 8.8*   Liver Function Tests:  Recent Labs Lab 05/19/15 1741 05/20/15 0228  AST 78* 78*  ALT 28 27  ALKPHOS 202* 185*  BILITOT 1.7* 1.7*  PROT 6.2* 5.7*  ALBUMIN 1.8* 1.6*   No results for input(s): LIPASE, AMYLASE in the last 168 hours.  Recent Labs Lab 05/20/15 0228  AMMONIA 67*   CBC:  Recent Labs Lab 05/19/15 1741 05/20/15 0228  WBC 9.3 8.0  NEUTROABS 6.9 5.8  HGB 11.0* 9.0*  HCT 33.0* 27.1*  MCV 99.1 98.2  PLT 244 229   Cardiac Enzymes:  Recent Labs Lab 05/19/15 1741 05/20/15 0228 05/20/15 1133  CKTOTAL 409* 292*  --   TROPONINI  --  0.13* 0.42*   BNP (last 3 results) No results for input(s): BNP in the last 8760 hours.  ProBNP (last 3 results) No results for input(s): PROBNP in the last 8760 hours.  CBG:  Recent Labs Lab 05/20/15 0549 05/20/15 0820 05/20/15 1310  GLUCAP 72 117* 117*    Recent Results (from the past 240 hour(s))  Blood Culture (routine x 2)     Status: None (Preliminary result)   Collection Time: 05/19/15  5:34 PM  Result Value Ref Range Status   Specimen Description BLOOD RIGHT EJ  Final   Special Requests BOTTLES DRAWN AEROBIC ONLY [place]  Final   Culture  Setup Time   Final    GRAM POSITIVE COCCI IN PAIRS IN CLUSTERS AEROBIC BOTTLE ONLY CRITICAL RESULT CALLED TO, READ BACK BY AND VERIFIED  WITH: A. AGUIRRE,RN AT [id number] ON [id number] BY Rhea Bleacher    Culture NO GROWTH < 24 HOURS  Final   Report Status PENDING  Incomplete  Urine culture     Status: None (Preliminary result)   Collection Time: 05/19/15  8:22 PM  Result Value Ref Range Status   Specimen Description URINE, CATHETERIZED  Final   Special Requests NONE  Final   Culture NO GROWTH < 12 HOURS  Final   Report Status PENDING  Incomplete  Blood Culture (routine x 2)     Status: None (Preliminary result)   Collection Time: 05/19/15  8:45 PM  Result Value Ref Range Status   Specimen Description BLOOD LEFT ANTECUBITAL  Final   Special Requests BOTTLES DRAWN AEROBIC AND ANAEROBIC 5CC  Final   Culture NO GROWTH < 24 HOURS  Final   Report Status PENDING  Incomplete     Studies: Dg Chest 2 View  05/19/2015  CLINICAL DATA:  Fall 3 days ago, found on right side today. EXAM: CHEST  2 VIEW COMPARISON:  Chest x-ray dated 05/14/2015. FINDINGS: There is now a complete opacification of the right hemi thorax, presumably large pleural effusion. There is an associated slight leftward shift of the mediastinal structures. Left lung is clear. Visualized portions of the cardiomediastinal silhouette appears stable in configuration. No acute- appearing osseous abnormality. Degenerative changes noted at the shoulders. IMPRESSION: Interval development of a complete opacification of the right hemithorax, presumably large pleural effusion. Underlying consolidation/pneumonia cannot be excluded. Recommend chest CT for further characterization. Electronically Signed   By: Franki Cabot M.D.   On: 05/19/2015 18:49   Dg Pelvis 1-2 Views  05/20/2015  CLINICAL DATA:  Acute onset of generalized edema. Initial encounter. EXAM: PELVIS - 1-2 VIEW COMPARISON:  Pelvic radiograph performed 01/08/2015 FINDINGS: There is no evidence of fracture or dislocation. The right femoral prosthesis is grossly unremarkable in appearance, without evidence of loosening, though  incompletely imaged. The left hip joint is unremarkable. Degenerative change is noted along the lower lumbar spine. The rectum is distended to 10.7 cm in transverse dimension with dense stool, concerning for fecal impaction. The remaining visualized bowel gas pattern is grossly unremarkable. IMPRESSION: 1. No evidence of fracture or dislocation. Right hip prosthesis is grossly unremarkable in appearance, though incompletely imaged. 2. Rectum distended to 10.7 cm in transverse dimension with stool, concerning for fecal impaction. Electronically Signed   By: Garald Balding M.D.   On: 05/20/2015 02:27   Dg Elbow Complete Right  05/19/2015  CLINICAL DATA:  Fall 3 days ago, right elbow pain EXAM: RIGHT ELBOW - COMPLETE 3+ VIEW COMPARISON:  Right elbow plain film dated 09/15/2014. FINDINGS: Again noted is an old avulsion fracture fragment adjacent to the medial epicondyle. No acute- appearing fracture line or displaced fracture fragment. No significant degenerative change. No evidence of joint effusion. Surrounding soft tissues are unremarkable. IMPRESSION: No acute findings. Electronically Signed   By: Franki Cabot M.D.   On: 05/19/2015 18:45   Ct Head Wo Contrast  05/19/2015  CLINICAL DATA:  Altered mental status. EXAM: CT HEAD WITHOUT CONTRAST TECHNIQUE: Contiguous axial images were obtained from the base of the skull through the vertex without intravenous contrast. COMPARISON:  CT scan of May 09, 2015. FINDINGS: Bilateral maxillary sinusitis is noted. Mild diffuse cortical atrophy is noted. Mild chronic ischemic white matter disease is noted. No mass effect or midline shift is noted. Ventricular size is within normal limits. There is no evidence of mass lesion, hemorrhage or acute infarction. IMPRESSION: Mild diffuse cortical atrophy. Mild chronic ischemic white matter disease. No acute intracranial abnormality seen. Electronically Signed   By: Marijo Conception, M.D.   On: 05/19/2015 18:26   Ct Chest Wo  Contrast  05/20/2015  CLINICAL DATA:  Recent fall. Prolonged time on floor, on right side. Facial pain and bilateral lower extremity swelling. Initial encounter. EXAM: CT CHEST WITHOUT CONTRAST TECHNIQUE: Multidetector CT imaging of the chest was performed following the standard protocol without IV contrast.  COMPARISON:  Chest radiographs performed earlier today at 6:08 p.m., 05/02/2015, and 01/06/2015 FINDINGS: There is a large right-sided pleural effusion, occupying the right hemithorax, with complete collapse of the right lung. The collapsed right lung appears somewhat irregular. This may reflect pneumonia. Underlying mass cannot excluded. Patchy left basilar airspace opacity raises concern for pneumonia. No pneumothorax is seen. Diffuse coronary artery calcifications are seen. The mediastinum is grossly unremarkable in appearance, though difficult to fully assess due to collapse of the right lung. The thyroid gland is unremarkable. No definite pericardial effusion is seen. No definite mediastinal lymphadenopathy is appreciated. Diffuse soft tissue edema is noted along the chest wall, more prominent on the left. Moderate volume ascites is seen within the upper abdomen. The nodular appearance of the liver raises concern for cirrhosis. The visualized portions of the spleen and pancreas are unremarkable. A stone is noted at the base of the gallbladder. No acute osseous abnormalities are seen. Degenerative change is noted at the glenohumeral joints bilaterally. Mild degenerative change is noted along the lower cervical spine, and at the lower thoracic spine. IMPRESSION: 1. Large right-sided pleural effusion occupies the right hemithorax, with complete collapse of the right lung. Somewhat irregular appearance to the collapsed right lung, concerning for pneumonia. Underlying mass cannot be excluded. Diagnostic thoracentesis could be considered for further evaluation. 2. Patchy left basilar airspace opacity also raises  concern for pneumonia. 3. Diffuse coronary artery calcifications seen. 4. Diffuse soft tissue edema along the chest wall, more prominent on the left. 5. Moderate volume ascites within the upper abdomen. 6. Findings of hepatic cirrhosis. 7. Cholelithiasis noted. Electronically Signed   By: Garald Balding M.D.   On: 05/20/2015 00:47   Dg Humerus Right  05/20/2015  CLINICAL DATA:  Arm edema.  Dementia. EXAM: RIGHT HUMERUS - 2+ VIEW COMPARISON:  None. FINDINGS: Nonspecific arm edema. No evidence of fracture or focal bone lesion. Advanced right glenohumeral osteoarthritis with high humeral head. Degenerative or osteonecrotic sclerosis and flattening of the humeral head. Right AC osteoarthritis with superior spurring. White out of the right chest. IMPRESSION: 1. No acute osseous finding. 2. Advanced right glenohumeral osteoarthritis with chronic rotator cuff tear. Possible humeral head AVN. 3. White out of the right chest from effusion and collapse. Electronically Signed   By: Monte Fantasia M.D.   On: 05/20/2015 02:25    Scheduled Meds: . aspirin EC  325 mg Oral BID PC  . buPROPion  450 mg Oral Daily  . carvedilol  6.25 mg Oral BID WC  . ceFEPime (MAXIPIME) IV  1 g Intravenous Q24H  . enoxaparin (LOVENOX) injection  30 mg Subcutaneous Q24H  . famotidine  20 mg Oral BID  . insulin aspart  0-9 Units Subcutaneous TID WC  . insulin detemir  5 Units Subcutaneous Daily  . pravastatin  20 mg Oral q1800  . ursodiol  300 mg Oral BID  . vancomycin  750 mg Intravenous Q24H   Continuous Infusions: . sodium chloride 100 mL/hr at 05/20/15 0226    Principal Problem:   Sepsis ([place]) Active Problems:   CKD (chronic kidney disease) stage 3, GFR 30-59 ml/min   Anemia of chronic disease   Diabetes mellitus with renal manifestations, uncontrolled (HCC)   Cirrhosis of liver with ascites (HCC)   HCAP (healthcare-associated pneumonia)   Dehydration   CHF (congestive heart failure) (HCC)   Pressure ulcer   Acute  encephalopathy   Pleural effusion   Acute respiratory failure with hypoxia ([place])   Demand ischemia (  [place])    [place], [facility] Pager [phone]. If 7PM-7AM, please contact night-coverage at [email or web address], password [facility] 05/20/2015, 4:24 PM  LOS: 1 day

## 2015-05-20 NOTE — Procedures (Signed)
Intubation Procedure Note Tamara Stephenson [id number] 1933/10/05  Procedure: Intubation Indications: Airway protection and maintenance  Procedure Details Consent: Risks of procedure as well as the alternatives and risks of each were explained to the (patient/caregiver).  Consent for procedure obtained. Time Out: Verified patient identification, verified procedure, site/side was marked, verified correct patient position, special equipment/implants available, medications/allergies/relevent history reviewed, required imaging and test results available.  Performed  Maximum sterile technique was used including gloves, gown, hand hygiene and mask.  MAC and 3  Pt was intubated for resp failure and 2 to large effusion on R and unable to protect airway. With glidoscope you, I was able to visualize the VC. ET size 7.5 inserted w/o difficulty. Good color change. Good breath sounfds (diminished on the R).  Meds received _ 4 mg Versed, 200 mcg Fentanyl, 20 mg etomidate -- all IV and in divided doses.    Evaluation Hemodynamic Status: BP stable throughout; O2 sats: stable throughout Patient's Current Condition: stable Complications: No apparent complications Patient did tolerate procedure well. Chest X-ray ordered to verify placement.  CXR: pending.   [place] 05/20/2015

## 2015-05-20 NOTE — Procedures (Signed)
Central Venous Catheter Insertion Procedure Note Corneshia Zelaya [id number] [birth year]  Procedure: Insertion of Central Venous Catheter Indications: Assessment of intravascular volume, Drug and/or fluid administration and Frequent blood sampling  Procedure Details Consent: Risks of procedure as well as the alternatives and risks of each were explained to the (patient/caregiver).  Consent for procedure obtained. Time Out: Verified patient identification, verified procedure, site/side was marked, verified correct patient position, special equipment/implants available, medications/allergies/relevent history reviewed, required imaging and test results available.  Performed  Maximum sterile technique was used including antiseptics, cap, gloves, gown, hand hygiene, mask and sheet. Skin prep: Chlorhexidine; local anesthetic administered A antimicrobial bonded/coated triple lumen catheter was placed in the right internal jugular vein using the Seldinger technique.  Evaluation Blood flow good Complications: No apparent complications Patient did tolerate procedure well. Chest X-ray ordered to verify placement.  CXR: pending.  Procedure performed under direct ultrasound guidance for real time vessel cannulation.      Montey Hora, [facility] Pager: [phone]  or [phone] 05/20/2015, 4:53 PM

## 2015-05-20 NOTE — Consult Note (Signed)
Consultation Note Date: 05/20/2015   Patient Name: Tamara Stephenson  DOB: March 25, 1933  MRN: [id number]  Age / Sex: 82 y.o., female  PCP: Curly Rim, MD Referring Physician: Donne Hazel, MD  Reason for Consultation: Establishing goals of care  Clinical Assessment/Narrative: 82 yo female just discharged from [facility] on 05/05/15 per [facility] service with DC diagnosis of: Hcap,CKD,DM,Ascites with cirrhosis,ESBL UTI,CHF,Extensive cellulitis,Pressure ulcer,,FTT.  Reported to have been admitted to [facility] in the interim, but was then admitted to [facility] after being found down from fall at her independent living facility. Initial eval notable for rt lung effusion with white out, lower and right upper extremity cellulitis with rt lower extremity with massive fluid filled blebs.  Started on abx and critical care evaluated right pleural effusion. She will most likely need intubation and chest tube at that point with transfer to ICU if decides on aggressive care path.  Palliative consulted for goals of care.  Patient is confused and is unable to engage in conversation regarding goals of care.  Contacts/Participants in Discussion: Unable to track down family (Reported to have none in this country) or [employer], who seems to be most appropriate surrogate based upon available information.  I am not sure if Ziggie is HCPOA or not.  SUMMARY OF RECOMMENDATIONS - Ms. Weingarten remains confused and is unable to engage in conversation regarding goals of care. - She was noted to be DO NOT RESUSCITATE on recent admission in November. This appears to have changed to FULL CODE during her last hospitalization beginning on 05/02/15. - I reached out to her PCP's (Dr. Neta Mends) office.  They do not have any documentation regarding advanced directives in her chart. - I tried to reach out to her friend, Ziggie Corbette, who is listed as her emergency  contact. I left several messages for her throughout the day asking for return call [phone]). - I also called Carmelina Paddock [phone]), who knows the patient from prior assisted living facility where she resided.  She reports that she is not the surrogate decision maker and believes that this would be Ziggie.  She does note, however, that she feels the patient would want to be full code based on recent conversation she has had with her with the patient has relayed that she still has "another 10-15 years" in her. - I discussed all this with Dr. Nelda Marseille this morning. As it was not an emergent need for intubation/chest tube at that time, we did await return call from Ziggie, however she did not return call during course of the day. I discussed again with Dr. Radford Pax regarding case this afternoon and plan for full, aggressive treatment.  Code Status/Advance Care Planning: Full code    Code Status Orders        Start     Ordered   05/20/15 0130  Full code   Continuous     05/20/15 0133    Code Status History    Date Active Date Inactive Code Status Order ID Comments User Context   05/02/2015  8:53 PM 05/05/2015  4:53 PM Full Code [id number]  Reubin Milan, MD Inpatient   01/08/2015  7:49 PM 01/10/2015  7:11 PM Full Code [id number]  Rod Can, MD Inpatient   01/06/2015  3:49 PM 01/08/2015  7:49 PM DNR [id number]  Elmarie Shiley, MD Inpatient   12/26/2014  9:08 PM 12/30/2014  3:42 PM DNR [id number]  Toy Baker, MD ED   09/15/2014  7:39 AM  09/20/2014  6:11 PM DNR [id number]  Eugenie Filler, MD Inpatient   10/12/2013  1:25 PM 10/21/2013  9:02 PM Full Code [id number]  Nita Sells, MD ED      Symptom Management:   Patient does not report any active symptoms. She remains confused and unreliable historian.  Palliative Prophylaxis:   Aspiration, Bowel Regimen and Frequent Pain Assessment  Psycho-social/Spiritual:  Support System: Poor Desire for further Chaplaincy  support:Unable to discuss Additional Recommendations: Caregiving  Support/Resources  Prognosis: Unable to determine  Discharge Planning: To be determined   Chief Complaint/ Primary Diagnoses: Present on Admission:  . Sepsis ([place]) . Acute encephalopathy . Pressure ulcer . HCAP (healthcare-associated pneumonia) . Diabetes mellitus with renal manifestations, uncontrolled ([place]) . CKD (chronic kidney disease) stage 3, GFR 30-59 ml/min . Dehydration . Anemia of chronic disease . Cirrhosis of liver with ascites ([place])  I have reviewed the medical record, interviewed the patient and family, and examined the patient. The following aspects are pertinent.  Past Medical History  Diagnosis Date  . Back pain   . Hypertension   . Carotid stenosis   . Colitis   . Diabetes mellitus without complication ([place])   . Pneumonia    Social History   Social History  . Marital Status: Single    Spouse Name: N/A  . Number of Children: N/A  . Years of Education: N/A   Social History Main Topics  . Smoking status: Current Every Day Smoker -- 0.20 packs/day  . Smokeless tobacco: None  . Alcohol Use: No  . Drug Use: No  . Sexual Activity: Not Asked   Other Topics Concern  . None   Social History Narrative   Used to work as a Network engineer   Family History  Problem Relation Age of Onset  . Leukemia Mother   . Jaundice Father    Scheduled Meds: . aspirin EC  325 mg Oral BID PC  . buPROPion  450 mg Oral Daily  . carvedilol  6.25 mg Oral BID WC  . ceFEPime (MAXIPIME) IV  1 g Intravenous Q24H  . enoxaparin (LOVENOX) injection  30 mg Subcutaneous Q24H  . famotidine  20 mg Oral BID  . insulin aspart  0-9 Units Subcutaneous TID WC  . insulin detemir  5 Units Subcutaneous Daily  . pravastatin  20 mg Oral q1800  . ursodiol  300 mg Oral BID  . vancomycin  750 mg Intravenous Q24H   Continuous Infusions: . sodium chloride 100 mL/hr at 05/20/15 1600   PRN Meds:. Medications Prior to Admission:   Prior to Admission medications   Medication Sig Start Date End Date Taking? Authorizing Provider  acetaminophen (TYLENOL) 500 MG tablet Take 500 mg by mouth every 6 (six) hours as needed for moderate pain.    Yes Historical Provider, MD  acidophilus (RISAQUAD) CAPS capsule Take 1 capsule by mouth daily. 12/30/14  Yes Florencia Reasons, MD  aspirin EC 325 MG EC tablet Take 1 tablet (325 mg total) by mouth 2 (two) times daily after a meal. 01/09/15  Yes Theodis Blaze, MD  BuPROPion HCl ER, XL, 450 MG TB24 Take 450 mg by mouth daily.   Yes Historical Provider, MD  carvedilol (COREG) 6.25 MG tablet Take 1 tablet (6.25 mg total) by mouth 2 (two) times daily with a meal. 12/30/14  Yes Florencia Reasons, MD  cholecalciferol (VITAMIN D) 1000 UNITS tablet Take 2,000 Units by mouth daily.    Yes Historical Provider, MD  furosemide (LASIX) 40 MG  tablet Take 1 tablet (40 mg total) by mouth daily. 12/30/14  Yes Florencia Reasons, MD  gabapentin (NEURONTIN) 300 MG capsule Take 1 capsule (300 mg total) by mouth at bedtime. 10/21/13  Yes Marianne L York, PA-C  guaiFENesin-dextromethorphan (ROBITUSSIN DM) 100-10 MG/5ML syrup Take 10 mLs by mouth every 6 (six) hours as needed for cough.   Yes Historical Provider, MD  insulin detemir (LEVEMIR) 100 UNIT/ML injection Inject 0.07 mLs (7 Units total) into the skin 2 (two) times daily. 05/05/15  Yes Geradine Girt, DO  liver oil-zinc oxide (DESITIN) 40 % ointment Apply 1 application topically as needed for irritation.   Yes Historical Provider, MD  lovastatin (MEVACOR) 20 MG tablet Take 20 mg by mouth at bedtime.   Yes Historical Provider, MD  ondansetron (ZOFRAN) 4 MG tablet Take 4 mg by mouth every 6 (six) hours as needed for nausea or vomiting.   Yes Historical Provider, MD  potassium chloride SA (K-DUR,KLOR-CON) 20 MEQ tablet Take 2 tablets (40 mEq total) by mouth daily. 12/30/14  Yes Florencia Reasons, MD  ranitidine (ZANTAC) 300 MG tablet Take 300 mg by mouth daily as needed for heartburn.    Yes Historical  Provider, MD  senna-docusate (SENOKOT S) 8.6-50 MG tablet Take 2 tablets by mouth daily.    Yes Historical Provider, MD  ursodiol (ACTIGALL) 300 MG capsule Take 300 mg by mouth 2 (two) times daily.    Yes Historical Provider, MD  cefUROXime (CEFTIN) 500 MG tablet Take 1 tablet (500 mg total) by mouth 2 (two) times daily with a meal. Patient not taking: Reported on 05/19/2015 05/05/15   Geradine Girt, DO  cephALEXin (KEFLEX) 500 MG capsule Take 1 capsule (500 mg total) by mouth 2 (two) times daily. Patient not taking: Reported on 05/19/2015 12/30/14   Florencia Reasons, MD   Allergies  Allergen Reactions  . Peanut-Containing Drug Products Nausea And Vomiting    Review of Systems  Unable to obtain  Physical Exam  General: Frail, ill appearing obese, elderly white female Neuro: Confused, does not fully arouse, occasional 1-2 word answers, but will not answer questions consistently, does not follow commands. HEENT: RT EJ IV in place Cardiovascular: RRR Lungs: Decreased on right Abdomen: Obese, + fluid wave Musculoskeletal: + edema Skin: Rt ue, bilateral lower ext cellulitis, rt lower ext with fluid filled blebs  Vital Signs: BP 102/48 mmHg  Pulse 85  Temp(Src) 96.9 F (36.1 C) (Axillary)  Resp 28  Ht 5\' 6"  (1.676 m)  Wt 91.4 kg (201 lb 8 oz)  BMI 32.54 kg/m2  SpO2 94%  SpO2: SpO2: 94 % O2 Device:SpO2: 94 % O2 Flow Rate: .O2 Flow Rate (L/min): 4 L/min  IO: Intake/output summary:  Intake/Output Summary (Last 24 hours) at 05/20/15 1651 Last data filed at 05/20/15 [id number]  Gross per 24 hour  Intake   3550 ml  Output   1350 ml  Net   2200 ml    LBM: Last BM Date: 05/20/15 Baseline Weight: Weight: 68.04 kg (150 lb) Most recent weight: Weight: 91.4 kg (201 lb 8 oz)      Palliative Assessment/Data:  Flowsheet Rows        Most Recent Value   Intake Tab    Referral Department  Hospitalist   Unit at Time of Referral  ER   Palliative Care Primary Diagnosis  Pulmonary   Date  Notified  05/20/15   Palliative Care Type  New Palliative care   Reason for referral  Clarify Goals  of Care   Date of Admission  05/19/15   Date first seen by Palliative Care  05/20/15   # of days Palliative referral response time  0 Day(s)   # of days IP prior to Palliative referral  1   Clinical Assessment    Palliative Performance Scale Score  20%   Pain Max last 24 hours  Not able to report   Pain Min Last 24 hours  Not able to report   Psychosocial & Spiritual Assessment    Palliative Care Outcomes    Patient/Family meeting held?  No      Additional Data Reviewed:  CBC:    Component Value Date/Time   WBC 8.0 05/20/2015 0228   HGB 9.0* 05/20/2015 0228   HCT 27.1* 05/20/2015 0228   HCT 30.4* 12/30/2014 0540   PLT 229 05/20/2015 0228   MCV 98.2 05/20/2015 0228   NEUTROABS 5.8 05/20/2015 0228   LYMPHSABS 1.2 05/20/2015 0228   MONOABS 1.0 05/20/2015 0228   EOSABS 0.0 05/20/2015 0228   BASOSABS 0.0 05/20/2015 0228   Comprehensive Metabolic Panel:    Component Value Date/Time   NA 145 05/20/2015 0228   K 4.2 05/20/2015 0228   CL 113* 05/20/2015 0228   CO2 22 05/20/2015 0228   BUN 45* 05/20/2015 0228   CREATININE 1.55* 05/20/2015 0228   GLUCOSE 83 05/20/2015 0228   CALCIUM 8.8* 05/20/2015 0228   AST 78* 05/20/2015 0228   ALT 27 05/20/2015 0228   ALKPHOS 185* 05/20/2015 0228   BILITOT 1.7* 05/20/2015 0228   PROT 5.7* 05/20/2015 0228   ALBUMIN 1.6* 05/20/2015 0228     Time In: 1025 Time Out: 1200 Time Total: 95 Greater than 50%  of this time was spent counseling and coordinating care related to the above assessment and plan.  Signed by: Micheline Rough, MD  Micheline Rough, MD  05/20/2015, 4:51 PM  Please contact Palliative Medicine Team phone at [phone] for questions and concerns.

## 2015-05-20 NOTE — Consult Note (Addendum)
WOC wound consult note Reason for Consult: Consult requested for left heel and right leg.   Wound type: Left heel with unstageable pressure injury; 2.5X2cm, 100% dry eschar, no odor or drainage Left leg with partial thickness skin loss, appears to be from previous blisters which have ruptured. 3X3X.1cm, pink and moist, small amt yellow drainage, no odor and .3X.4X.1cm, red and dry scabbed area without odor or drainage Pressure Ulcer POA: Yes Right leg with with partial thickness skin loss, appears to be from previous blisters which have ruptured. 5X5X.1cm pink and moist, mod amt yellow drainage, no odor. Dressing procedure/placement/frequency: Foam dressing to left heel and left leg. It is best practice to leave dry stable eschar intact. Offload pressure with Prevalon boot.  Xeroform gauze to right leg to promote drying and healing.  No family members at bedside to discuss plan of care. Please re-consult if further assistance is needed.  Thank-you,  Julien Girt MSN, [place], [place], [place], [place]

## 2015-05-20 NOTE — Consult Note (Signed)
Name: Janese Wiltse MRN: [id number] DOB: Dec 24, 1933    ADMISSION DATE:  05/19/2015 CONSULTATION DATE:  3/15  REFERRING MD :  [facility]  CHIEF COMPLAINT:  FTT, fall, sepsis, rt pleural effusion  BRIEF PATIENT DESCRIPTION: Elderly frail female  SIGNIFICANT EVENTS    STUDIES:     HISTORY OF PRESENT ILLNESS:  82 yo female with and extensive pmh and documented decline in functional status. Just discharged from [facility] on 05/05/15 per [facility] service with DC diagnosis of: Hcap,CKD,DM,Ascites with cirrhosis,ESBL UTI,CHF,Extensive cellulitis,Pressure ulcer,,FTTand was admitted to [facility] ED 3/115 after being found down from fall. Reported to be down four days(doubt) and was notable for rt lung effusion with white out, lower and right upper extremity cellulitis with rt lower extremity with massive fluid filled blebs. Labs reveal lactic acid 1.7, procal of .31,wbc of 8.0. Mildly hypotensive, poor IV access, creatine of 1.55(her baseline) and confused. She has been treated with abx and PCCM asked to address right pleural effusion. We have asked primary service to to fully address code status with possible NCB and goal of care comfort. PCCM will be available if aggressive care is requested. She will most likely need intubation and chest tube at that point with transfer to ICU.  PAST MEDICAL HISTORY :   has a past medical history of Back pain; Hypertension; Carotid stenosis; Colitis; Diabetes mellitus without complication ([place]); and Pneumonia.  has past surgical history that includes Carotid endarterectomy; Lumbar laminectomy for epidural abscess (N/A, 10/16/2013); and Hip Arthroplasty (Right, 01/08/2015). Prior to Admission medications   Medication Sig Start Date End Date Taking? Authorizing Provider  acetaminophen (TYLENOL) 500 MG tablet Take 500 mg by mouth every 6 (six) hours as needed for moderate pain.    Yes Historical Provider, MD  acidophilus (RISAQUAD) CAPS capsule Take 1 capsule by mouth daily. 12/30/14   Yes Florencia Reasons, MD  aspirin EC 325 MG EC tablet Take 1 tablet (325 mg total) by mouth 2 (two) times daily after a meal. 01/09/15  Yes Theodis Blaze, MD  BuPROPion HCl ER, XL, 450 MG TB24 Take 450 mg by mouth daily.   Yes Historical Provider, MD  carvedilol (COREG) 6.25 MG tablet Take 1 tablet (6.25 mg total) by mouth 2 (two) times daily with a meal. 12/30/14  Yes Florencia Reasons, MD  cholecalciferol (VITAMIN D) 1000 UNITS tablet Take 2,000 Units by mouth daily.    Yes Historical Provider, MD  furosemide (LASIX) 40 MG tablet Take 1 tablet (40 mg total) by mouth daily. 12/30/14  Yes Florencia Reasons, MD  gabapentin (NEURONTIN) 300 MG capsule Take 1 capsule (300 mg total) by mouth at bedtime. 10/21/13  Yes Marianne L York, PA-C  guaiFENesin-dextromethorphan (ROBITUSSIN DM) 100-10 MG/5ML syrup Take 10 mLs by mouth every 6 (six) hours as needed for cough.   Yes Historical Provider, MD  insulin detemir (LEVEMIR) 100 UNIT/ML injection Inject 0.07 mLs (7 Units total) into the skin 2 (two) times daily. 05/05/15  Yes Geradine Girt, DO  liver oil-zinc oxide (DESITIN) 40 % ointment Apply 1 application topically as needed for irritation.   Yes Historical Provider, MD  lovastatin (MEVACOR) 20 MG tablet Take 20 mg by mouth at bedtime.   Yes Historical Provider, MD  ondansetron (ZOFRAN) 4 MG tablet Take 4 mg by mouth every 6 (six) hours as needed for nausea or vomiting.   Yes Historical Provider, MD  potassium chloride SA (K-DUR,KLOR-CON) 20 MEQ tablet Take 2 tablets (40 mEq total) by mouth daily. 12/30/14  Yes  Florencia Reasons, MD  ranitidine (ZANTAC) 300 MG tablet Take 300 mg by mouth daily as needed for heartburn.    Yes Historical Provider, MD  senna-docusate (SENOKOT S) 8.6-50 MG tablet Take 2 tablets by mouth daily.    Yes Historical Provider, MD  ursodiol (ACTIGALL) 300 MG capsule Take 300 mg by mouth 2 (two) times daily.    Yes Historical Provider, MD  cefUROXime (CEFTIN) 500 MG tablet Take 1 tablet (500 mg total) by mouth 2 (two) times  daily with a meal. Patient not taking: Reported on 05/19/2015 05/05/15   Geradine Girt, DO  cephALEXin (KEFLEX) 500 MG capsule Take 1 capsule (500 mg total) by mouth 2 (two) times daily. Patient not taking: Reported on 05/19/2015 12/30/14   Florencia Reasons, MD   Allergies  Allergen Reactions  . Peanut-Containing Drug Products Nausea And Vomiting    FAMILY HISTORY:  family history includes Jaundice in her father; Leukemia in her mother. SOCIAL HISTORY:  reports that she has been smoking.  She does not have any smokeless tobacco history on file. She reports that she does not drink alcohol or use illicit drugs.  REVIEW OF SYSTEMS:   na  SUBJECTIVE:   VITAL SIGNS: Temp:  [94.5 F (34.7 C)-98.4 F (36.9 C)] 97.2 F (36.2 C) (03/15 0656) Pulse Rate:  [80-100] 90 (03/15 0600) Resp:  [13-26] 18 (03/15 0700) BP: (91-154)/(34-126) 107/62 mmHg (03/15 0700) SpO2:  [91 %-100 %] 95 % (03/15 0700) Weight:  [150 lb (68.04 kg)-201 lb 8 oz (91.4 kg)] 201 lb 8 oz (91.4 kg) (03/15 0656)  PHYSICAL EXAMINATION: General:  Frail obese, elderly white female Neuro:  Confused, answers some questions. HEENT:  RT EJ IV in place Cardiovascular: HSD RRR Lungs:  Decreased bs rt Abdomen:  Obese, + fluid wave Musculoskeletal:  intact Skin:  Rt ue, bilateral lower ext cellulitis, rt lower ext with fluid filled blebs   Recent Labs Lab 05/19/15 1741 05/20/15 0228  NA 144 145  K 4.7 4.2  CL 112* 113*  CO2 23 22  BUN 46* 45*  CREATININE 1.62* 1.55*  GLUCOSE 99 83    Recent Labs Lab 05/19/15 1741 05/20/15 0228  HGB 11.0* 9.0*  HCT 33.0* 27.1*  WBC 9.3 8.0  PLT 244 229   Dg Chest 2 View  05/19/2015  CLINICAL DATA:  Fall 3 days ago, found on right side today. EXAM: CHEST  2 VIEW COMPARISON:  Chest x-ray dated 05/14/2015. FINDINGS: There is now a complete opacification of the right hemi thorax, presumably large pleural effusion. There is an associated slight leftward shift of the mediastinal structures.  Left lung is clear. Visualized portions of the cardiomediastinal silhouette appears stable in configuration. No acute- appearing osseous abnormality. Degenerative changes noted at the shoulders. IMPRESSION: Interval development of a complete opacification of the right hemithorax, presumably large pleural effusion. Underlying consolidation/pneumonia cannot be excluded. Recommend chest CT for further characterization. Electronically Signed   By: Franki Cabot M.D.   On: 05/19/2015 18:49   Dg Pelvis 1-2 Views  05/20/2015  CLINICAL DATA:  Acute onset of generalized edema. Initial encounter. EXAM: PELVIS - 1-2 VIEW COMPARISON:  Pelvic radiograph performed 01/08/2015 FINDINGS: There is no evidence of fracture or dislocation. The right femoral prosthesis is grossly unremarkable in appearance, without evidence of loosening, though incompletely imaged. The left hip joint is unremarkable. Degenerative change is noted along the lower lumbar spine. The rectum is distended to 10.7 cm in transverse dimension with dense stool, concerning for fecal  impaction. The remaining visualized bowel gas pattern is grossly unremarkable. IMPRESSION: 1. No evidence of fracture or dislocation. Right hip prosthesis is grossly unremarkable in appearance, though incompletely imaged. 2. Rectum distended to 10.7 cm in transverse dimension with stool, concerning for fecal impaction. Electronically Signed   By: Garald Balding M.D.   On: 05/20/2015 02:27   Dg Elbow Complete Right  05/19/2015  CLINICAL DATA:  Fall 3 days ago, right elbow pain EXAM: RIGHT ELBOW - COMPLETE 3+ VIEW COMPARISON:  Right elbow plain film dated 09/15/2014. FINDINGS: Again noted is an old avulsion fracture fragment adjacent to the medial epicondyle. No acute- appearing fracture line or displaced fracture fragment. No significant degenerative change. No evidence of joint effusion. Surrounding soft tissues are unremarkable. IMPRESSION: No acute findings. Electronically Signed    By: Franki Cabot M.D.   On: 05/19/2015 18:45   Ct Head Wo Contrast  05/19/2015  CLINICAL DATA:  Altered mental status. EXAM: CT HEAD WITHOUT CONTRAST TECHNIQUE: Contiguous axial images were obtained from the base of the skull through the vertex without intravenous contrast. COMPARISON:  CT scan of May 09, 2015. FINDINGS: Bilateral maxillary sinusitis is noted. Mild diffuse cortical atrophy is noted. Mild chronic ischemic white matter disease is noted. No mass effect or midline shift is noted. Ventricular size is within normal limits. There is no evidence of mass lesion, hemorrhage or acute infarction. IMPRESSION: Mild diffuse cortical atrophy. Mild chronic ischemic white matter disease. No acute intracranial abnormality seen. Electronically Signed   By: Marijo Conception, M.D.   On: 05/19/2015 18:26   Ct Chest Wo Contrast  05/20/2015  CLINICAL DATA:  Recent fall. Prolonged time on floor, on right side. Facial pain and bilateral lower extremity swelling. Initial encounter. EXAM: CT CHEST WITHOUT CONTRAST TECHNIQUE: Multidetector CT imaging of the chest was performed following the standard protocol without IV contrast. COMPARISON:  Chest radiographs performed earlier today at 6:08 p.m., 05/02/2015, and 01/06/2015 FINDINGS: There is a large right-sided pleural effusion, occupying the right hemithorax, with complete collapse of the right lung. The collapsed right lung appears somewhat irregular. This may reflect pneumonia. Underlying mass cannot excluded. Patchy left basilar airspace opacity raises concern for pneumonia. No pneumothorax is seen. Diffuse coronary artery calcifications are seen. The mediastinum is grossly unremarkable in appearance, though difficult to fully assess due to collapse of the right lung. The thyroid gland is unremarkable. No definite pericardial effusion is seen. No definite mediastinal lymphadenopathy is appreciated. Diffuse soft tissue edema is noted along the chest wall, more  prominent on the left. Moderate volume ascites is seen within the upper abdomen. The nodular appearance of the liver raises concern for cirrhosis. The visualized portions of the spleen and pancreas are unremarkable. A stone is noted at the base of the gallbladder. No acute osseous abnormalities are seen. Degenerative change is noted at the glenohumeral joints bilaterally. Mild degenerative change is noted along the lower cervical spine, and at the lower thoracic spine. IMPRESSION: 1. Large right-sided pleural effusion occupies the right hemithorax, with complete collapse of the right lung. Somewhat irregular appearance to the collapsed right lung, concerning for pneumonia. Underlying mass cannot be excluded. Diagnostic thoracentesis could be considered for further evaluation. 2. Patchy left basilar airspace opacity also raises concern for pneumonia. 3. Diffuse coronary artery calcifications seen. 4. Diffuse soft tissue edema along the chest wall, more prominent on the left. 5. Moderate volume ascites within the upper abdomen. 6. Findings of hepatic cirrhosis. 7. Cholelithiasis noted. Electronically Signed  By: Garald Balding M.D.   On: 05/20/2015 00:47   Dg Humerus Right  05/20/2015  CLINICAL DATA:  Arm edema.  Dementia. EXAM: RIGHT HUMERUS - 2+ VIEW COMPARISON:  None. FINDINGS: Nonspecific arm edema. No evidence of fracture or focal bone lesion. Advanced right glenohumeral osteoarthritis with high humeral head. Degenerative or osteonecrotic sclerosis and flattening of the humeral head. Right AC osteoarthritis with superior spurring. White out of the right chest. IMPRESSION: 1. No acute osseous finding. 2. Advanced right glenohumeral osteoarthritis with chronic rotator cuff tear. Possible humeral head AVN. 3. White out of the right chest from effusion and collapse. Electronically Signed   By: Monte Fantasia M.D.   On: 05/20/2015 02:25    ASSESSMENT    Pleural effusion   Sepsis (HCC)   CKD (chronic kidney  disease) stage 3, GFR 30-59 ml/min   Anemia of chronic disease   Diabetes mellitus with renal manifestations, uncontrolled (HCC)   Cirrhosis of liver with ascites (HCC)   HCAP (healthcare-associated pneumonia)   Dehydration   CHF (congestive heart failure) (HCC)   Pressure ulcer   Acute encephalopathy    Discussion: 82 yo female with and extensive pmh and documented decline in functional status. Just discharged from [facility] on 05/05/15 per [facility] service with DC diagnosis of: Hcap,CKD,DM,Ascites with cirrhosis,ESBL UTI,CHF,Extensive cellulitis,Pressure ulcer,,FTTand was admitted to [facility] ED 3/115 after being found down from fall. Reported to be down four days(doubt) and was notable for rt lung effusion with white out, lower and right upper extremity cellulitis with rt lower extremity with massive fluid filled blebs. Labs reveal lactic acid 1.7, procal of .31,wbc of 8.0. Mildly hypotensive, poor IV access, creatine of 1.55(her baseline) and confused. She has been treated with abx and PCCM asked to address right pleural effusion. We have asked primary service to to fully address code status with possible NCB and goal of care comfort. PCCM will be available if aggressive care is requested. She will most likely need intubation and chest tube at that point with transfer to ICU.   Goals of care clarification. If agressive care is goal will tx to ICU and place chest tube vs thoracentesis. Agree with abx IVF as needed Treatment of cellulitis   Richardson Landry Minor ACNP Maryanna Shape PCCM Pager [phone] till 3 pm If no answer page [phone] 05/20/2015, 8:44 AM  Attending Note:  82 year old female with very extensive PMH who was recently discharged from the hospital for cellulitis, found down on the floor barely responsive.  Brought to the ED and CXR revealed complete whiteout of the right lung.  Concern for trauma versus cancer.  The patient's mental status on exam precludes discussion of code status but patient was DNR  the last admission.  If we are to do much will need to intubate, mechanically ventilate and place a chest tube.  If this lung is trapped then it will not inflate and patient will be in this situation indefinitely which was not her wishes on last admission.  Since patient is not crashing right this second will allow time for palliative care to speak with family as we were unsuccessful in getting in touch with anybody.  Update: Spoke with palliative care.  They were able to get in touch with the director of the patient's SNF.  She was able to give palliative care MD contact info for Ziggy who is evidently the [place] that lives in [place] but ziggy's husband is in a rehab facility and she is there visiting.  After discussion, we decided that since patient is not crashing right now to give ziggy sometime to call back as a message was left.  If not contact by afternoon then we will have to treat patient as full code, will intubate and place a chest tube which in my opinion will not be beneficial long term but will address the situation short term.  We will revisit.  The patient is critically ill with multiple organ systems failure and requires high complexity decision making for assessment and support, frequent evaluation and titration of therapies, application of advanced monitoring technologies and extensive interpretation of multiple databases.   Critical Care Time devoted to patient care services described in this note is  45  Minutes. This time reflects time of care of this signee Dr Jennet Maduro. This critical care time does not reflect procedure time, or teaching time or supervisory time of PA/NP/Med student/Med Resident etc but could involve care discussion time.  Rush Farmer, M.D. [facility] Pulmonary/Critical Care Medicine. Pager: [phone]. After hours pager: [phone].

## 2015-05-20 NOTE — Progress Notes (Signed)
EEG Completed; Results Pending  

## 2015-05-20 NOTE — Progress Notes (Signed)
Blood culture positve for gram cocci in pairs and clusters MD was paged.

## 2015-05-20 NOTE — Progress Notes (Addendum)
PULMONARY / CRITICAL CARE MEDICINE   Name: Tamara Stephenson MRN: [id number] DOB: [birth year]    ADMISSION DATE:  05/19/2015 CONSULTATION DATE:  05/20/2015  REFERRING MD:  [facility]  CHIEF COMPLAINT:  Fall, sepsis, Rt pleural effusion  Follow up Note:  S> I spoke with Dr. Domingo Cocking regarding getting hold of family to make decision for the pt.  He was not able to get hold of any.  Still waiting for a friend to call back.  Pt has episodes of on and off drowsiness. I was able to wake her up and talk to her. She is oriented to time, place.  She knew she was at a hospital for breathing issues.  She said she did not have any family, other than her 2 cats. No children as well.  I extensively d/w her regarding her lung issues/resp failure/R pleural effusion.  I mentioned we may need to intubate her because of her mental status and resp status.  I mentioned we need to place a chest tube to drain fluid.  She was OK with doing intubation and chest tube insertion.  I asked about her wishes -- she said she wants everything done.  I mentioned she was DNR during last admission -- she does not remember that.  She wants to be a full code.  I asked about possible tracheostomy -- she said yes to tracheostome.  O> 100/60  80  20  98%  On 4L O2. On and off drowsiness.  (-) NVD.  Mild resp distress.  Dec BS in R lung. Crackles in L mid-bases. Good s1/s2. (+) BS, soft, (-) masses/tenderness Gr 2 edema.   A> Acute resp Failure.        R pleural effusion -- parapneumonic vs edema. lR/O HCAP       Demand Ischemia       H/o CHF -- EF 40% in 2016.        AKI/CKD  Plan : 1. Transfer to ICU. 2. Need to intubate for resp failure/unable to protect airway. 3. Plan for chest tube, R. Bedside US with significant effusion in R. 4. Needs a rpt chest ct scan once fluid drained. 5. Cont IV abx. 6. F/u on cultures.  7. Will need echo after chest tube.  8. Will send fluid for culture, trache culture.  9. Check MRSA.   Critical  care time spent on this patient is 30 minutes up to this point.   Monica Becton, MD Pulmonary and [place] Pager: [phone] After 3 pm or if no response, call [phone]  05/20/2015, 3:13 PM

## 2015-05-20 NOTE — ED Notes (Signed)
Attempted to call report

## 2015-05-20 NOTE — Procedures (Signed)
Chest Tube Insertion Procedure Note  Indications:  Clinically significant Effusion  Pre-operative Diagnosis: Effusion  Post-operative Diagnosis: Effusion  Procedure Details  Informed consent was obtained for the procedure, including sedation.  Risks of lung perforation, hemorrhage, arrhythmia, and adverse drug reaction were discussed.   After sterile skin prep, using standard technique, a 20 French tube was placed in the right lateral 8th rib space.  Findings: At least 1000 ml of light yellow / amber fluid obtained (majority of fluid gushed out of skin incision site as pleural space was entered)  Estimated Blood Loss:  Minimal         Specimens:  Light yellow amber fluid              Complications:  None; patient tolerated the procedure well.         Disposition: ICU - intubated and critically ill.         Condition: stable   Montey Hora, [place] [facility] [place] Pulmonary & Critical Care Medicine Pager: [phone]  or [phone] 05/20/2015, 5:45 PM   Attending Attestation: I was present for the entire procedure.

## 2015-05-20 NOTE — Progress Notes (Signed)
*  PRELIMINARY RESULTS* Vascular Ultrasound Right upper extremity venous duplex= no evidence of DVT or SVT    Landry Mellow, RDMS, RVT  05/20/2015, 11:48 AM

## 2015-05-20 NOTE — ED Notes (Signed)
Pt. given juice and apple sauce. Alert / denies pain or discomfort , respirations unlabored , IV site intact .

## 2015-05-21 ENCOUNTER — Other Ambulatory Visit (HOSPITAL_COMMUNITY): Payer: Medicare HMO

## 2015-05-21 ENCOUNTER — Inpatient Hospital Stay (HOSPITAL_COMMUNITY): Payer: Medicare HMO

## 2015-05-21 DIAGNOSIS — Z9911 Dependence on respirator [ventilator] status: Secondary | ICD-10-CM

## 2015-05-21 DIAGNOSIS — B952 Enterococcus as the cause of diseases classified elsewhere: Secondary | ICD-10-CM

## 2015-05-21 LAB — BODY FLUID CELL COUNT WITH DIFFERENTIAL
Eos, Fluid: 0 %
LYMPHS FL: 19 %
MONOCYTE-MACROPHAGE-SEROUS FLUID: 4 % — AB (ref 50–90)
NEUTROPHIL FLUID: 77 % — AB (ref 0–25)
Total Nucleated Cell Count, Fluid: 1413 cu mm — ABNORMAL HIGH (ref 0–1000)

## 2015-05-21 LAB — CBC
HEMATOCRIT: 27.1 % — AB (ref 36.0–46.0)
HEMATOCRIT: 27.1 % — AB (ref 36.0–46.0)
HEMOGLOBIN: 8.9 g/dL — AB (ref 12.0–15.0)
HEMOGLOBIN: 9.1 g/dL — AB (ref 12.0–15.0)
MCH: 32.2 pg (ref 26.0–34.0)
MCH: 34 pg (ref 26.0–34.0)
MCHC: 32.8 g/dL (ref 30.0–36.0)
MCHC: 33.6 g/dL (ref 30.0–36.0)
MCV: 98.2 fL (ref 78.0–100.0)
MCV: 101.1 fL — AB (ref 78.0–100.0)
Platelets: 252 10*3/uL (ref 150–400)
Platelets: 248 10*3/uL (ref 150–400)
RBC: 2.76 MIL/uL — ABNORMAL LOW (ref 3.87–5.11)
RBC: 2.68 MIL/uL — ABNORMAL LOW (ref 3.87–5.11)
RDW: 18.8 % — ABNORMAL HIGH (ref 11.5–15.5)
RDW: 19.2 % — AB (ref 11.5–15.5)
WBC: 13.3 10*3/uL — ABNORMAL HIGH (ref 4.0–10.5)
WBC: 12.5 10*3/uL — AB (ref 4.0–10.5)

## 2015-05-21 LAB — GLUCOSE, CAPILLARY
GLUCOSE-CAPILLARY: 129 mg/dL — AB (ref 65–99)
GLUCOSE-CAPILLARY: 81 mg/dL (ref 65–99)
Glucose-Capillary: 136 mg/dL — ABNORMAL HIGH (ref 65–99)
Glucose-Capillary: 108 mg/dL — ABNORMAL HIGH (ref 65–99)
Glucose-Capillary: 103 mg/dL — ABNORMAL HIGH (ref 65–99)
Glucose-Capillary: 111 mg/dL — ABNORMAL HIGH (ref 65–99)

## 2015-05-21 LAB — AMMONIA: Ammonia: 50 umol/L — ABNORMAL HIGH (ref 9–35)

## 2015-05-21 LAB — BASIC METABOLIC PANEL
ANION GAP: 9 (ref 5–15)
BUN: 49 mg/dL — ABNORMAL HIGH (ref 6–20)
CALCIUM: 8.8 mg/dL — AB (ref 8.9–10.3)
CHLORIDE: 114 mmol/L — AB (ref 101–111)
CO2: 21 mmol/L — AB (ref 22–32)
Creatinine, Ser: 1.63 mg/dL — ABNORMAL HIGH (ref 0.44–1.00)
GFR calc non Af Amer: 29 mL/min — ABNORMAL LOW (ref >60)
GFR, EST AFRICAN AMERICAN: 33 mL/min — AB (ref >60)
GLUCOSE: 153 mg/dL — AB (ref 65–99)
POTASSIUM: 4.1 mmol/L (ref 3.5–5.1)
Sodium: 144 mmol/L (ref 135–145)

## 2015-05-21 LAB — LACTATE DEHYDROGENASE, PLEURAL OR PERITONEAL FLUID: LD FL: 150 U/L — AB (ref 3–23)

## 2015-05-21 LAB — URINE CULTURE: CULTURE: NO GROWTH

## 2015-05-21 LAB — GLUCOSE, SEROUS FLUID: Glucose, Fluid: 126 mg/dL

## 2015-05-21 LAB — PROTEIN, BODY FLUID

## 2015-05-21 LAB — PHOSPHORUS: PHOSPHORUS: 4.4 mg/dL (ref 2.5–4.6)

## 2015-05-21 LAB — MAGNESIUM: Magnesium: 1.8 mg/dL (ref 1.7–2.4)

## 2015-05-21 LAB — STREP PNEUMONIAE URINARY ANTIGEN: STREP PNEUMO URINARY ANTIGEN: NEGATIVE

## 2015-05-21 MED ORDER — IPRATROPIUM-ALBUTEROL 0.5-2.5 (3) MG/3ML IN SOLN: 3.0000 mL | RESPIRATORY_TRACT | Status: DC | PRN

## 2015-05-21 MED ORDER — INSULIN ASPART 100 UNIT/ML ~~LOC~~ SOLN
0.0000 [IU] | SUBCUTANEOUS | Status: DC
  Administered 2015-05-22 (×3): 2 [IU] via SUBCUTANEOUS
  Administered 2015-05-22 (×2): 3 [IU] via SUBCUTANEOUS
  Administered 2015-05-23: 2 [IU] via SUBCUTANEOUS
  Administered 2015-05-23 (×4): 3 [IU] via SUBCUTANEOUS

## 2015-05-21 MED ORDER — MIDAZOLAM HCL 2 MG/2ML IJ SOLN
2.0000 mg | INTRAMUSCULAR | Status: DC | PRN
  Administered 2015-05-21 – 2015-05-24 (×6): 2 mg via INTRAVENOUS
  Administered 2015-05-25: 1 mg via INTRAVENOUS
  Filled 2015-05-21 (×9): qty 2

## 2015-05-21 MED ORDER — BUPROPION HCL 75 MG PO TABS
150.0000 mg | ORAL_TABLET | Freq: Three times a day (TID) | ORAL | Status: DC
  Filled 2015-05-21 (×2): qty 2

## 2015-05-21 MED ORDER — FUROSEMIDE 10 MG/ML IJ SOLN
40.0000 mg | Freq: Once | INTRAMUSCULAR | Status: AC
  Administered 2015-05-21: 40 mg via INTRAVENOUS
  Filled 2015-05-21: qty 4

## 2015-05-21 MED ORDER — SODIUM CHLORIDE 0.9 % IV SOLN
INTRAVENOUS | Status: DC
  Administered 2015-05-21: 10:00:00 via INTRAVENOUS

## 2015-05-21 MED ORDER — ASPIRIN 81 MG PO CHEW
81.0000 mg | CHEWABLE_TABLET | Freq: Every day | ORAL | Status: DC
  Administered 2015-05-22 – 2015-05-29 (×8): 81 mg
  Filled 2015-05-21 (×8): qty 1

## 2015-05-21 MED ORDER — VITAL HIGH PROTEIN PO LIQD
1000.0000 mL | ORAL | Status: DC
  Administered 2015-05-21 – 2015-05-22 (×3)
  Administered 2015-05-22 – 2015-05-23 (×2): 1000 mL

## 2015-05-21 MED ORDER — PANTOPRAZOLE SODIUM 40 MG PO PACK
40.0000 mg | PACK | ORAL | Status: DC
  Administered 2015-05-21 – 2015-05-27 (×5): 40 mg
  Filled 2015-05-21 (×6): qty 20

## 2015-05-21 MED ORDER — INSULIN ASPART 100 UNIT/ML ~~LOC~~ SOLN
2.0000 [IU] | SUBCUTANEOUS | Status: DC
Start: 2015-05-21 — End: 2015-05-21
  Administered 2015-05-21 (×2): 2 [IU] via SUBCUTANEOUS

## 2015-05-21 MED ORDER — PRO-STAT SUGAR FREE PO LIQD
30.0000 mL | Freq: Three times a day (TID) | ORAL | Status: DC
  Administered 2015-05-21 – 2015-05-23 (×7): 30 mL
  Filled 2015-05-21 (×7): qty 30

## 2015-05-21 NOTE — Consult Note (Signed)
[facility] Infectious Disease       Reason for Consult:Enterococcal positive blood culture    Referring Physician: CHAMP autoconsult  Principal Problem:   Sepsis ([place]) Active Problems:   CKD (chronic kidney disease) stage 3, GFR 30-59 ml/min   Anemia of chronic disease   Diabetes mellitus with renal manifestations, uncontrolled (HCC)   Cirrhosis of liver with ascites (HCC)   HCAP (healthcare-associated pneumonia)   Dehydration   CHF (congestive heart failure) (HCC)   Pressure ulcer   Acute encephalopathy   Pleural effusion   Acute respiratory failure with hypoxia ([place])   Demand ischemia ([place])   Encounter for central line placement   Respiratory failure ([place])   Chest tube in place   Pleural effusion, right   Palliative care encounter   Goals of care, counseling/discussion   . antiseptic oral rinse  7 mL Mouth Rinse 10 times per day  . aspirin  81 mg Per Tube Daily  . [START ON 05/22/2015] buPROPion  150 mg Oral TID  . ceFEPime (MAXIPIME) IV  1 g Intravenous Q24H  . chlorhexidine gluconate  15 mL Mouth Rinse BID  . enoxaparin (LOVENOX) injection  30 mg Subcutaneous Q24H  . feeding supplement (PRO-STAT SUGAR FREE 64)  30 mL Per Tube TID  . feeding supplement (VITAL HIGH PROTEIN)  1,000 mL Per Tube Q24H  . insulin aspart  0-15 Units Subcutaneous 6 times per day  . insulin detemir  5 Units Subcutaneous Daily  . lactulose  10 g Per Tube TID  . pantoprazole sodium  40 mg Per Tube Q24H  . vancomycin  750 mg Intravenous Q24H    Recommendations: No changes unless a second Enterococcal organism grows.    I will not follow up, please call with clinical changes, thanks  Assessment: She has 1 blood culture positive for Enterococcus out of 2 with unclear significance.  She has respiratory failure, effusion which would not be consistent with an Enterococcal infection so at this time I would not recommend treatment or evaluation for the Enterococcus unless a second  blood culture is positive.   Antibiotics: Vancomycin and cefepime  HPI: Tamara Stephenson is a 82 y.o. female with smoking history, came in after a fall, confused, hypothermic, had increased lactate and was apparently down for several days and required intubation for acute hypoxic respiratory failure.  CXR with large right pleural effusion.  Aspiration done and noted WBCs, no organisms seen.     Review of Systems:  Unable to be assessed due to mental status All other systems reviewed and are negative   Past Medical History  Diagnosis Date  . Back pain   . Hypertension   . Carotid stenosis   . Colitis   . Diabetes mellitus without complication ([place])   . Pneumonia     Social History  Substance Use Topics  . Smoking status: Current Every Day Smoker -- 0.20 packs/day  . Smokeless tobacco: None  . Alcohol Use: No    Family History  Problem Relation Age of Onset  . Leukemia Mother   . Jaundice Father     Allergies  Allergen Reactions  . Peanut-Containing Drug Products Nausea And Vomiting    Physical Exam: Constitutional: in no apparent distress, intubated, sedated Filed Vitals:   05/21/15 1113 05/21/15 1200  BP: 116/49 105/52  Pulse: 75 69  Temp:  98.5 F (36.9 C)  Resp: 16 16   EYES: anicteric ENMT: ET in place Cardiovascular: Cor Tachy Respiratory: rhonchi  anteriorly; GI: Bowel sounds are normal, liver is not enlarged, spleen is not enlarged Musculoskeletal: no pedal edema noted Skin: negatives: no rash Hematologic: no cervical lad  Lab Results  Component Value Date   WBC 12.5* 05/21/2015   HGB 9.1* 05/21/2015   HCT 27.1* 05/21/2015   MCV 101.1* 05/21/2015   PLT 248 05/21/2015    Lab Results  Component Value Date   CREATININE 1.63* 05/21/2015   BUN 49* 05/21/2015   NA 144 05/21/2015   K 4.1 05/21/2015   CL 114* 05/21/2015   CO2 21* 05/21/2015    Lab Results  Component Value Date   ALT 27 05/20/2015   AST 78* 05/20/2015   ALKPHOS 185* 05/20/2015       Microbiology: Recent Results (from the past 240 hour(s))  Blood Culture (routine x 2)     Status: None (Preliminary result)   Collection Time: 05/19/15  5:34 PM  Result Value Ref Range Status   Specimen Description BLOOD RIGHT EJ  Final   Special Requests BOTTLES DRAWN AEROBIC ONLY [place]  Final   Culture  Setup Time   Final    GRAM POSITIVE COCCI IN PAIRS IN CLUSTERS AEROBIC BOTTLE ONLY CRITICAL RESULT CALLED TO, READ BACK BY AND VERIFIED WITH: A. AGUIRRE,RN AT [id number] ON [id number] BY Rhea Bleacher    Culture   Final    ENTEROCOCCUS SPECIES SUSCEPTIBILITIES TO FOLLOW STAPHYLOCOCCUS SPECIES (COAGULASE NEGATIVE) THE SIGNIFICANCE OF ISOLATING THIS ORGANISM FROM A SINGLE SET OF BLOOD CULTURES WHEN MULTIPLE SETS ARE DRAWN IS UNCERTAIN. PLEASE NOTIFY THE MICROBIOLOGY DEPARTMENT WITHIN ONE WEEK IF SPECIATION AND SENSITIVITIES ARE REQUIRED.    Report Status PENDING  Incomplete  Urine culture     Status: None   Collection Time: 05/19/15  8:22 PM  Result Value Ref Range Status   Specimen Description URINE, CATHETERIZED  Final   Special Requests NONE  Final   Culture NO GROWTH 2 DAYS  Final   Report Status 05/21/2015 FINAL  Final  Blood Culture (routine x 2)     Status: None (Preliminary result)   Collection Time: 05/19/15  8:45 PM  Result Value Ref Range Status   Specimen Description BLOOD LEFT ANTECUBITAL  Final   Special Requests BOTTLES DRAWN AEROBIC AND ANAEROBIC 5CC  Final   Culture NO GROWTH 2 DAYS  Final   Report Status PENDING  Incomplete  MRSA PCR Screening     Status: None   Collection Time: 05/20/15  3:42 PM  Result Value Ref Range Status   MRSA by PCR NEGATIVE NEGATIVE Final    Comment:        The GeneXpert MRSA Assay (FDA approved for NASAL specimens only), is one component of a comprehensive MRSA colonization surveillance program. It is not intended to diagnose MRSA infection nor to guide or monitor treatment for MRSA infections.   Body fluid culture     Status: None  (Preliminary result)   Collection Time: 05/21/15  8:30 AM  Result Value Ref Range Status   Specimen Description FLUID PLEURAL  Final   Special Requests NONE  Final   Gram Stain   Final    MODERATE WBC PRESENT,BOTH PMN AND MONONUCLEAR NO ORGANISMS SEEN    Culture PENDING  Incomplete   Report Status PENDING  Incomplete    Scharlene Gloss, [facility] [email or web address] [phone] pager  [phone] cell 05/21/2015, 2:40 PM

## 2015-05-21 NOTE — Progress Notes (Signed)
PULMONARY / CRITICAL CARE MEDICINE   Name: Tamara Stephenson MRN: [id number] DOB: [birth year]    ADMISSION DATE:  05/19/2015 CONSULTATION DATE:  05/20/2015  REFERRING MD:  [facility]  CHIEF COMPLAINT:  Short of breath  SUBJECTIVE:  Tolerating SBT.  VITAL SIGNS: BP 138/50 mmHg  Pulse 74  Temp(Src) 97.3 F (36.3 C) (Axillary)  Resp 18  Ht 5\' 6"  (1.676 m)  Wt 201 lb 15.1 oz (91.6 kg)  BMI 32.61 kg/m2  SpO2 100%  VENTILATOR SETTINGS: Vent Mode:  [-] CPAP FiO2 (%):  [30 %-50 %] 30 % Set Rate:  [16 bmp] 16 bmp Vt Set:  [470 mL] 470 mL PEEP:  [5 cmH20] 5 cmH20 Pressure Support:  [5 cmH20] 5 cmH20 Plateau Pressure:  [15 cmH20-16 cmH20] 15 cmH20  INTAKE / OUTPUT: I/O last 3 completed shifts: In: 5098.3 [I.V.:4898.3; IV Piggyback:200] Out: 2870 [id number]; Chest Tube:1000]  PHYSICAL EXAMINATION: General:  intubated Neuro:  Unresponsive HEENT:  ETT in place Cardiovascular:  regular Lungs:  B/l rhonchi,Rt chest tube in place Abdomen:  Soft,nontener Musculoskeletal:  3+ edema Skin:  No rashes  LABS:  BMET  Recent Labs Lab 05/19/15 1741 05/20/15 0228 05/21/15 0350  NA 144 145 144  K 4.7 4.2 4.1  CL 112* 113* 114*  CO2 23 22 21*  BUN 46* 45* 49*  CREATININE 1.62* 1.55* 1.63*  GLUCOSE 99 83 153*    Electrolytes  Recent Labs Lab 05/19/15 1741 05/20/15 0228 05/20/15 1905 05/21/15 0350  CALCIUM 9.3 8.8*  --  8.8*  MG  --   --  1.8 1.8  PHOS  --   --  4.8* 4.4    CBC  Recent Labs Lab 05/20/15 0228 05/21/15 0105 05/21/15 0350  WBC 8.0 13.3* 12.5*  HGB 9.0* 8.9* 9.1*  HCT 27.1* 27.1* 27.1*  PLT 229 252 248    Coag's  Recent Labs Lab 05/20/15 0228  APTT 32  INR 1.17    Sepsis Markers  Recent Labs Lab 05/19/15 1750 05/19/15 2050 05/20/15 0228  LATICACIDVEN 2.42* 1.90 1.8  1.7  PROCALCITON  --   --  0.31    ABG  Recent Labs Lab 05/20/15 1807  PHART 7.317*  PCO2ART 40.9  PO2ART 108.0*    Liver Enzymes  Recent Labs Lab  05/19/15 1741 05/20/15 0228  AST 78* 78*  ALT 28 27  ALKPHOS 202* 185*  BILITOT 1.7* 1.7*  ALBUMIN 1.8* 1.6*    Cardiac Enzymes  Recent Labs Lab 05/20/15 0228 05/20/15 1133 05/20/15 2006  TROPONINI 0.13* 0.42* 0.40*    Glucose  Recent Labs Lab 05/20/15 0820 05/20/15 1310 05/20/15 2042 05/20/15 2328 05/21/15 0351 05/21/15 0819  GLUCAP 117* 117* 111* 150* 136* 129*    Imaging Portable Chest Xray  05/21/2015  CLINICAL DATA:  Respiratory failure. EXAM: PORTABLE CHEST 1 VIEW COMPARISON:  May 20, 2015. FINDINGS: Stable cardiomediastinal silhouette. Endotracheal tube is unchanged in position. Right internal jugular catheter is unchanged in position. Interval placement of a nasogastric tube which is seen entering stomach. Hypoinflation of the lungs is noted with bibasilar opacities consistent with atelectasis or infiltrates. No pneumothorax is noted. Bony thorax is unremarkable. IMPRESSION: Stable support apparatus. Interval placement of nasogastric tube. Hypoinflation of the lungs is noted with bibasilar atelectasis or infiltrates. Electronically Signed   By: Marijo Conception, M.D.   On: 05/21/2015 07:16   Dg Chest Port 1 View  05/20/2015  CLINICAL DATA:  82 year old female with history of intubation and chest tube insertion. EXAM: PORTABLE CHEST  1 VIEW COMPARISON:  Chest x-ray 05/20/2015. FINDINGS: An endotracheal tube is in place with tip 5.0 cm above the carina. New right-sided chest tube with tip directed toward the medial aspect of the base of the right hemithorax. Previously noted right IJ central venous catheter is similarly positioned with tip in the distal superior vena cava. Lung volumes are low. Improved aeration throughout the right lung related to decreased size of what is now a small to moderate right-sided pleural effusion. Bibasilar opacities may reflect areas of atelectasis and/or consolidation. No definite evidence of pulmonary edema. Heart size is borderline  enlarged. The patient is rotated to the left on today's exam, resulting in distortion of the mediastinal contours and reduced diagnostic sensitivity and specificity for mediastinal pathology. Atherosclerosis in the thoracic aorta. IMPRESSION: 1. Support apparatus, as above. 2. Decreased size of what is now a small to moderate right-sided pleural effusion following placement of right-sided chest tube. 3. Low lung volumes with bibasilar areas of atelectasis and/or airspace consolidation. Electronically Signed   By: Vinnie Langton M.D.   On: 05/20/2015 18:08   Dg Chest Port 1 View  05/20/2015  CLINICAL DATA:  Central line placement EXAM: PORTABLE CHEST 1 VIEW COMPARISON:  05/19/2015 FINDINGS: 1650 hours. Right IJ central line is new in the interval. Tip overlies the expected location of the mid SVC. Complete opacification of the right hemi thorax is again noted. Interval development of vascular congestion with interstitial opacity in the left lung. Cardiopericardial silhouette is at upper limits of normal for size. The visualized bony structures of the thorax are intact. Degenerative changes are noted in the glenohumeral joints bilaterally. Telemetry leads overlie the chest. IMPRESSION: Right IJ central line tip overlies the position of the mid SVC. Persistent whiteout of the right hemi thorax, better characterized on yesterday's chest CT. Electronically Signed   By: Misty Stanley M.D.   On: 05/20/2015 17:04   Dg Abd Portable 1v  05/20/2015  CLINICAL DATA:  OG tube placement. EXAM: PORTABLE ABDOMEN - 1 VIEW COMPARISON:  None. FINDINGS: OG tube appears adequately positioned in the stomach with tip directed towards the gastric antrum/ pylorus. Additional catheter within the adjacent right upper quadrant. Visualized bowel gas pattern is nonobstructive. IMPRESSION: OG tube appears adequately positioned in the stomach with tip directed towards the gastric antrum/pylorus. Electronically Signed   By: Franki Cabot  M.D.   On: 05/20/2015 20:35     STUDIES:  3/14 CT head >> diffuse atrophy, chronic white matter ischemic changes 3/14 CT chest >> Rt pleural effusion with compressive ATX of Rt lung 3/14 EEG >> diffuse slowing 3/15 Rt arm doppler >> no DVT  CULTURES: 3/14 Urine >> 3/14 Blood >> GPC in clusters >> 3/15 Rt pleural fluid >>  3/15 Sputum >> 3/15 Pneumococcal Ag >> negative 3/15 Legionella Ag >>   ANTIBIOTICS: 3/14 Vancomycin >> 3/14 Cefepime >>   SIGNIFICANT EVENTS: 3/14 Admit 3/15 Palliative care consult >> full code  LINES/TUBES: 3/15 ETT >> 3/15 Rt IJ CVL >> 3/15 Rt chest tube >>   DISCUSSION: 82 yo female smoker from assisted living with fall.  She was confused, hypothermic, and had elevated lactic acid.  She was found to have large Rt pleural effusion.  She required intubation for acute hypoxic respiratory failure.  She had recent tx for Pna, cellulitis, decubitus ulcers at [facility].  She has hx of cirrhosis, DM, systolic/diastolic CHF, CKD.  ASSESSMENT / PLAN:  PULMONARY A: Acute hypoxic respiratory failure 2nd to HCAP and  Rt pleural effusion. Tobacco abuse. P:   Pressure support wean >> not ready for extubation until mental status better Chest tube to -20 cm suction F/u pleural fluid analysis F/u CXR Prn BDs  CARDIOVASCULAR A:  Hypotension 2nd to sedation. Chronic combined CHF. Hx of HTN, HLD. P:  ASA Lasix 40 mg IV x one 3/16 Wean off pressors to keep MAP > 65 Hold outpt coreg, mevacor  RENAL A:   CKD stage II. Lactic acidosis. P:   Monitor renal fx, urine outpt  GASTROINTESTINAL A:   Nutrition. Hx of cirrhosis. P:   Tube feeds while on vent Protonix for SUP F/u LFTs  HEMATOLOGIC A:   Anemia of critical illness and chronic disease. P:  F/u CBC Lovenox for DVT prevention  INFECTIOUS A:   HCAP with Rt pleural effusion. GPC in clusters in blood culture from 3/14. P:   Day 3 of vancomycin, cefepime   ENDOCRINE A:    DM. P:   SSI with levemir  NEUROLOGIC A:   Acute metabolic encephalopathy >> ammonia 67 from 3/15. P:   RASS goal: 0 Prn versed, fentanyl Continue bupropion Hold outpt neurontin Continue lactulose  CC time 38 minutes  Chesley Mires, MD [place] 05/21/2015, 10:03 AM Pager:  [phone] After 3pm call: [phone]

## 2015-05-21 NOTE — Progress Notes (Signed)
PT Cancellation Note  Patient Details Name: Tamara Stephenson MRN: [id number] DOB: 31-Dec-1933   Cancelled Treatment:    Reason Eval/Treat Not Completed: Medical issues which prohibited therapy.  Patient intubated 05/20/15.  PT orders cancelled by MD.  Please re-order PT consult when appropriate for patient. Thank you.   Tamara Stephenson 05/21/2015, 12:32 PM Tamara Stephenson. Tamara Stephenson, [place] Pager [phone]

## 2015-05-21 NOTE — Progress Notes (Signed)
I stopped by to check on Tamara Stephenson this morning.  She is intubated and not able to interact this AM.  Will plan to follow her clinical course peripherally over the next several days to see how she responds to current therapy.  Please let us know if we can be of further assistance in the interim.   Micheline Rough, MD [place] Team [phone]

## 2015-05-21 NOTE — Progress Notes (Signed)
Per last disch pt lives at [facility] and act w [facility] agency for [id number]

## 2015-05-21 NOTE — Progress Notes (Signed)
Initial Nutrition Assessment  DOCUMENTATION CODES:   Not applicable  INTERVENTION:  -Begin Vital High Protein @ 56mL/hr increase by 10 every 6 hours to goal rate of 72mL/hr -Pro-Stat 43mL TID, each supplement provides 100 calories and 15g protein -Tubefeeding regimen provides 1260 calories, 133g protein, 802cc free water   NUTRITION DIAGNOSIS:   Inadequate oral intake related to inability to eat as evidenced by NPO status.  GOAL:   Provide needs based on ASPEN/SCCM guidelines  MONITOR:   Vent status, Labs, I & O's, Skin, TF tolerance  REASON FOR ASSESSMENT:   Low Braden    ASSESSMENT:   Tamara Stephenson is a 82 y.o. female with history of diabetes mellitus type 2, cirrhosis of the liver, CHF, chronic kidney disease and chronic anemia who was recently discharged from the hospital 2 weeks ago after being treated for pneumonia and lower extremity cellulitis and decubitus ulcers was found on the floor at the assisted living facility.  Patient is currently intubated on ventilator support MV: 10.9 L/min Temp (24hrs), Avg:97.5 F (36.4 C), Min:96.9 F (36.1 C), Max:98 F (36.7 C)  Propofol: none  Visited pt at bedside. Intubated, sedated. No family available. Per chart, she is suffering from Sepsis r/t pneumonia & cellulitis. She also has multiple pressure ulcers. She presented from Assisted Living with a fall. Was found to have Rt Pleural effusion, was intubated r/t Acute Hypoxic Respiratory failure.  Nutrition-Focused physical exam completed. Findings are no fat depletion, no muscle depletion, and moderate-severe edema.   Per RN Propofol will be d/c pt will be on versed and fentanyl PRN.  Labs: CBGs 111-150 Medications: Levo Drip; Fent PRN; Versed PRN    Diet Order:  Diet NPO time specified  Skin:   PU to heel, sacrum, hip & butt  Last BM:  3/15  Height:   Ht Readings from Last 1 Encounters:  05/20/15 5\' 6"  (1.676 m)    Weight:   Wt Readings from Last 1  Encounters:  05/21/15 201 lb 15.1 oz (91.6 kg)    Ideal Body Weight:  59.09 kg  BMI:  Body mass index is 32.61 kg/(m^2).  Estimated Nutritional Needs:   Kcal:  [id number]  Protein:  110-137 grams  Fluid:  >/= 1L  EDUCATION NEEDS:   No education needs identified at this time  Tamara Anis. Maelyn Berrey, MS, RD LDN After Hours/Weekend Pager [phone]

## 2015-05-22 ENCOUNTER — Inpatient Hospital Stay (HOSPITAL_COMMUNITY): Payer: Medicare HMO

## 2015-05-22 DIAGNOSIS — I4891 Unspecified atrial fibrillation: Secondary | ICD-10-CM

## 2015-05-22 DIAGNOSIS — K7031 Alcoholic cirrhosis of liver with ascites: Secondary | ICD-10-CM

## 2015-05-22 LAB — CBC
HCT: 27 % — ABNORMAL LOW (ref 36.0–46.0)
Hemoglobin: 8.9 g/dL — ABNORMAL LOW (ref 12.0–15.0)
MCH: 32.6 pg (ref 26.0–34.0)
MCHC: 33 g/dL (ref 30.0–36.0)
MCV: 98.9 fL (ref 78.0–100.0)
PLATELETS: 215 10*3/uL (ref 150–400)
RBC: 2.73 MIL/uL — AB (ref 3.87–5.11)
RDW: 19.3 % — AB (ref 11.5–15.5)
WBC: 6.8 10*3/uL (ref 4.0–10.5)

## 2015-05-22 LAB — COMPREHENSIVE METABOLIC PANEL
ALT: 25 U/L (ref 14–54)
AST: 59 U/L — AB (ref 15–41)
Albumin: 1.5 g/dL — ABNORMAL LOW (ref 3.5–5.0)
Alkaline Phosphatase: 168 U/L — ABNORMAL HIGH (ref 38–126)
Anion gap: 6 (ref 5–15)
BUN: 48 mg/dL — AB (ref 6–20)
CHLORIDE: 116 mmol/L — AB (ref 101–111)
CO2: 23 mmol/L (ref 22–32)
CREATININE: 1.65 mg/dL — AB (ref 0.44–1.00)
Calcium: 8.9 mg/dL (ref 8.9–10.3)
GFR calc non Af Amer: 28 mL/min — ABNORMAL LOW (ref >60)
GFR, EST AFRICAN AMERICAN: 33 mL/min — AB (ref >60)
Glucose, Bld: 156 mg/dL — ABNORMAL HIGH (ref 65–99)
POTASSIUM: 3.5 mmol/L (ref 3.5–5.1)
SODIUM: 145 mmol/L (ref 135–145)
Total Bilirubin: 1.6 mg/dL — ABNORMAL HIGH (ref 0.3–1.2)
Total Protein: 5.6 g/dL — ABNORMAL LOW (ref 6.5–8.1)

## 2015-05-22 LAB — MAGNESIUM: MAGNESIUM: 1.8 mg/dL (ref 1.7–2.4)

## 2015-05-22 LAB — ACID FAST SMEAR (AFB, MYCOBACTERIA)

## 2015-05-22 LAB — GLUCOSE, CAPILLARY
GLUCOSE-CAPILLARY: 143 mg/dL — AB (ref 65–99)
GLUCOSE-CAPILLARY: 190 mg/dL — AB (ref 65–99)
Glucose-Capillary: 114 mg/dL — ABNORMAL HIGH (ref 65–99)
Glucose-Capillary: 144 mg/dL — ABNORMAL HIGH (ref 65–99)
Glucose-Capillary: 149 mg/dL — ABNORMAL HIGH (ref 65–99)
Glucose-Capillary: 170 mg/dL — ABNORMAL HIGH (ref 65–99)

## 2015-05-22 LAB — LEGIONELLA ANTIGEN, URINE

## 2015-05-22 LAB — ECHOCARDIOGRAM COMPLETE
Height: 66 in
Weight: 3125.24 oz

## 2015-05-22 LAB — CULTURE, BLOOD (ROUTINE X 2)

## 2015-05-22 LAB — PHOSPHORUS: PHOSPHORUS: 4 mg/dL (ref 2.5–4.6)

## 2015-05-22 LAB — ACID FAST SMEAR (AFB): ACID FAST SMEAR - AFSCU2: NEGATIVE

## 2015-05-22 MED ORDER — MAGNESIUM SULFATE 2 GM/50ML IV SOLN
2.0000 g | Freq: Once | INTRAVENOUS | Status: AC
  Administered 2015-05-22: 2 g via INTRAVENOUS
  Filled 2015-05-22: qty 50

## 2015-05-22 MED ORDER — LACTULOSE 10 GM/15ML PO SOLN
30.0000 g | Freq: Three times a day (TID) | ORAL | Status: DC
  Administered 2015-05-22 – 2015-05-23 (×4): 30 g
  Filled 2015-05-22 (×4): qty 45

## 2015-05-22 MED ORDER — RIFAXIMIN 200 MG PO TABS
400.0000 mg | ORAL_TABLET | Freq: Three times a day (TID) | ORAL | Status: DC
  Administered 2015-05-22 – 2015-05-23 (×3): 400 mg via ORAL
  Filled 2015-05-22 (×5): qty 2

## 2015-05-22 NOTE — Progress Notes (Signed)
Pharmacy Antibiotic Note  Tamara Stephenson is a 82 y.o. female admitted on 05/19/2015 with pneumonia and sepsis.  Pharmacy has been consulted for vancomycin and cefepime dosing.  Hypothermic 96.6 overnight, wbc now normal at 6.8, continues on low dose NE. SCr stable at 1.6 with good uop.   ID has been autoconsulted d/t positive bld cx, question clinical significance of 1/2 positive bld cx for enterococcus and unusual presentation for organism. Sensitivities are not back yet. Consider de-escalating antibiotics once sensitivities result.  Plan: This patient's current antibiotics will be continued without adjustments.  Will continue to follow culture results Will plan on vancomycin level over weekend if to continue  Height: 5\' 6"  (167.6 cm) Weight: 195 lb 5.2 oz (88.6 kg) IBW/kg (Calculated) : 59.3  Temp (24hrs), Avg:97.3 F (36.3 C), Min:96 F (35.6 C), Max:98.9 F (37.2 C)   Recent Labs Lab 05/19/15 1741 05/19/15 1750 05/19/15 2050 05/20/15 0228 05/21/15 0105 05/21/15 0350 05/22/15 0500  WBC 9.3  --   --  8.0 13.3* 12.5* 6.8  CREATININE 1.62*  --   --  1.55*  --  1.63* 1.65*  LATICACIDVEN  --  2.42* 1.90 1.8  1.7  --   --   --     Estimated Creatinine Clearance: 31 mL/min (by C-G formula based on Cr of 1.65).    Allergies  Allergen Reactions  . Peanut-Containing Drug Products Nausea And Vomiting    Antimicrobials this admission: 3/14 Vancomycin >> 3/14 Cefepime >>   Dose adjustments this admission: N/a  Microbiology results: 3/14 Urine >>ngF 3/14 Blood >> 1/2 enterococcus 3/15 Rt pleural fluid >> pending 3/15 Sputum >>pending 3/15 Pneumococcal Ag >> negative 3/15 Legionella Ag >> neg 3/15 MRSA PCR: neg  Thank you for allowing pharmacy to be a part of this patient's care.  Erin Hearing PharmD., BCPS Clinical Pharmacist Pager [phone] 05/22/2015 8:15 AM

## 2015-05-22 NOTE — Consult Note (Signed)
WOC requested to update orders for left and right legs.  Discussed with bedside nurse, new area of blistering on the other leg will add xeroform orders after discussion with bedside nurse.  Discussed POC with patient and bedside nurse.  Re consult if needed, will not follow at this time. Thanks  Keniesha Adderly Kellogg, [place] [phone])

## 2015-05-22 NOTE — Progress Notes (Signed)
  Echocardiogram 2D Echocardiogram has been performed.  [name] 05/22/2015, 11:47 AM

## 2015-05-22 NOTE — Progress Notes (Signed)
PULMONARY / CRITICAL CARE MEDICINE   Name: Tamara Stephenson MRN: [id number] DOB: 07-19-1933    ADMISSION DATE:  05/19/2015 CONSULTATION DATE:  05/20/2015  REFERRING MD:  [facility]ad  CHIEF COMPLAINT:  Short of breath  BRIEF 82 yo female smoker from assisted living with fall.  She was confused, hypothermic, and had elevated lactic acid.  She was found to have large Rt pleural effusion.  She required intubation for acute hypoxic respiratory failure.  She had recent tx for Pna, cellulitis, decubitus ulcers at [facility].  She has hx of cirrhosis, DM, systolic/diastolic CHF, CKD.   CULTURES: 3/14 Urine >> 3/14 Blood >> GPC in clusters >> 3/15 Rt pleural fluid >>  3/15 Sputum >> 3/15 Pneumococcal Ag >> negative 3/15 Legionella Ag >>   ANTIBIOTICS: 3/14 Vancomycin >> 3/14 Cefepime >>  LINES/TUBES: 3/15 ETT >> 3/15 Rt IJ CVL >> 3/15 Rt chest tube >>   SIGNIFICANT EVENTS: 3/14 Admit 3/14 CT head >> diffuse atrophy, chronic white matter ischemic changes 3/14 CT chest >> Rt pleural effusion with compressive ATX of Rt lung 3/14 EEG >> diffuse slowing 3/15 Rt arm doppler >> no DVT 3/15 Palliative care consult >> full code 3/16 - not awake enough for extubation but did follow some commands   SUBJECTIVE:  3/17 - anasarca per RN. Failed SBT after 90 min. Still with hypoactive delirium despite off diprivan x 12h. On lactulose - only  1BM. Not on xifaxan. Noticed to be on wellbutrin Still on levophed  VITAL SIGNS: BP 119/51 mmHg  Pulse 79  Temp(Src) 98.1 F (36.7 C) (Oral)  Resp 17  Ht 5\' 6"  (1.676 m)  Wt 88.6 kg (195 lb 5.2 oz)  BMI 31.54 kg/m2  SpO2 100%  VENTILATOR SETTINGS: Vent Mode:  [-] PRVC FiO2 (%):  [30 %] 30 % Set Rate:  [16 bmp] 16 bmp Vt Set:  [470 mL] 470 mL PEEP:  [5 cmH20] 5 cmH20 Pressure Support:  [12 cmH20] 12 cmH20 Plateau Pressure:  [13 cmH20-18 cmH20] 16 cmH20  INTAKE / OUTPUT: I/O last 3 completed shifts: In: 1913.6 [I.V.:953.6; NG/GT:560; IV  Piggyback:400] Out: 4846 [Urine:2766; Chest Tube:2080]  PHYSICAL EXAMINATION: General:  intubated Neuro:  RASS -3 equivalent off diprivan x 12h HEENT:  ETT in place Cardiovascular:  regular Lungs:  B/l rhonchi,Rt chest tube in place Abdomen:  Soft,nontener Musculoskeletal:  3+ edema Skin:  No rashes  LABS:   PULMONARY  Recent Labs Lab 05/20/15 1807  PHART 7.317*  PCO2ART 40.9  PO2ART 108.0*  HCO3 20.9  TCO2 22  O2SAT 98.0    CBC  Recent Labs Lab 05/21/15 0105 05/21/15 0350 05/22/15 0500  HGB 8.9* 9.1* 8.9*  HCT 27.1* 27.1* 27.0*  WBC 13.3* 12.5* 6.8  PLT 252 248 215    COAGULATION  Recent Labs Lab 05/20/15 0228  INR 1.17    CARDIAC   Recent Labs Lab 05/20/15 0228 05/20/15 1133 05/20/15 2006  TROPONINI 0.13* 0.42* 0.40*   No results for input(s): PROBNP in the last 168 hours.   CHEMISTRY  Recent Labs Lab 05/19/15 1741 05/20/15 0228 05/20/15 1905 05/21/15 0350 05/22/15 0500  NA 144 145  --  144 145  K 4.7 4.2  --  4.1 3.5  CL 112* 113*  --  114* 116*  CO2 23 22  --  21* 23  GLUCOSE 99 83  --  153* 156*  BUN 46* 45*  --  49* 48*  CREATININE 1.62* 1.55*  --  1.63* 1.65*  CALCIUM 9.3  8.8*  --  8.8* 8.9  MG  --   --  1.8 1.8 1.8  PHOS  --   --  4.8* 4.4 4.0   Estimated Creatinine Clearance: 31 mL/min (by C-G formula based on Cr of 1.65).   LIVER  Recent Labs Lab 05/19/15 1741 05/20/15 0228 05/20/15 1905 05/22/15 0500  AST 78* 78*  --  59*  ALT 28 27  --  25  ALKPHOS 202* 185*  --  168*  BILITOT 1.7* 1.7*  --  1.6*  PROT 6.2* 5.7* 6.0* 5.6*  ALBUMIN 1.8* 1.6*  --  1.5*  INR  --  1.17  --   --      INFECTIOUS  Recent Labs Lab 05/19/15 1750 05/19/15 2050 05/20/15 0228  LATICACIDVEN 2.42* 1.90 1.8  1.7  PROCALCITON  --   --  0.31     ENDOCRINE CBG (last 3)   Recent Labs  05/21/15 2312 05/22/15 0446 05/22/15 0747  GLUCAP 111* 114* 143*         IMAGING x48h  - image(s) personally visualized  -    highlighted in bold Dg Chest Port 1 View  05/22/2015  CLINICAL DATA:  Respiratory failure EXAM: PORTABLE CHEST 1 VIEW COMPARISON:  05/21/2015 FINDINGS: Endotracheal tube, central line and right basilar chest tube remain in place, unchanged. NG tube is again noted in the stomach. Bibasilar airspace opacities with small effusions, similar to prior study. No pneumothorax. Cardiomegaly with vascular congestion. IMPRESSION: Stable bibasilar opacities and small effusions. Vascular congestion. No change. Electronically Signed   By: Rolm Baptise M.D.   On: 05/22/2015 08:12   Portable Chest Xray  05/21/2015  CLINICAL DATA:  Respiratory failure. EXAM: PORTABLE CHEST 1 VIEW COMPARISON:  May 20, 2015. FINDINGS: Stable cardiomediastinal silhouette. Endotracheal tube is unchanged in position. Right internal jugular catheter is unchanged in position. Interval placement of a nasogastric tube which is seen entering stomach. Hypoinflation of the lungs is noted with bibasilar opacities consistent with atelectasis or infiltrates. No pneumothorax is noted. Bony thorax is unremarkable. IMPRESSION: Stable support apparatus. Interval placement of nasogastric tube. Hypoinflation of the lungs is noted with bibasilar atelectasis or infiltrates. Electronically Signed   By: Marijo Conception, M.D.   On: 05/21/2015 07:16   Dg Chest Port 1 View  05/20/2015  CLINICAL DATA:  82 year old female with history of intubation and chest tube insertion. EXAM: PORTABLE CHEST 1 VIEW COMPARISON:  Chest x-ray 05/20/2015. FINDINGS: An endotracheal tube is in place with tip 5.0 cm above the carina. New right-sided chest tube with tip directed toward the medial aspect of the base of the right hemithorax. Previously noted right IJ central venous catheter is similarly positioned with tip in the distal superior vena cava. Lung volumes are low. Improved aeration throughout the right lung related to decreased size of what is now a small to moderate right-sided  pleural effusion. Bibasilar opacities may reflect areas of atelectasis and/or consolidation. No definite evidence of pulmonary edema. Heart size is borderline enlarged. The patient is rotated to the left on today's exam, resulting in distortion of the mediastinal contours and reduced diagnostic sensitivity and specificity for mediastinal pathology. Atherosclerosis in the thoracic aorta. IMPRESSION: 1. Support apparatus, as above. 2. Decreased size of what is now a small to moderate right-sided pleural effusion following placement of right-sided chest tube. 3. Low lung volumes with bibasilar areas of atelectasis and/or airspace consolidation. Electronically Signed   By: Vinnie Langton M.D.   On: 05/20/2015 18:08  Dg Chest Port 1 View  05/20/2015  CLINICAL DATA:  Central line placement EXAM: PORTABLE CHEST 1 VIEW COMPARISON:  05/19/2015 FINDINGS: 1650 hours. Right IJ central line is new in the interval. Tip overlies the expected location of the mid SVC. Complete opacification of the right hemi thorax is again noted. Interval development of vascular congestion with interstitial opacity in the left lung. Cardiopericardial silhouette is at upper limits of normal for size. The visualized bony structures of the thorax are intact. Degenerative changes are noted in the glenohumeral joints bilaterally. Telemetry leads overlie the chest. IMPRESSION: Right IJ central line tip overlies the position of the mid SVC. Persistent whiteout of the right hemi thorax, better characterized on yesterday's chest CT. Electronically Signed   By: Misty Stanley M.D.   On: 05/20/2015 17:04   Dg Abd Portable 1v  05/20/2015  CLINICAL DATA:  OG tube placement. EXAM: PORTABLE ABDOMEN - 1 VIEW COMPARISON:  None. FINDINGS: OG tube appears adequately positioned in the stomach with tip directed towards the gastric antrum/ pylorus. Additional catheter within the adjacent right upper quadrant. Visualized bowel gas pattern is nonobstructive.  IMPRESSION: OG tube appears adequately positioned in the stomach with tip directed towards the gastric antrum/pylorus. Electronically Signed   By: Franki Cabot M.D.   On: 05/20/2015 20:35       DISCUSSION: 82 yo female smoker from assisted living with fall.  She was confused, hypothermic, and had elevated lactic acid.  She was found to have large Rt pleural effusion.  She required intubation for acute hypoxic respiratory failure.  She had recent tx for Pna, cellulitis, decubitus ulcers at [facility].  She has hx of cirrhosis, DM, systolic/diastolic CHF, CKD.  ASSESSMENT / PLAN:  PULMONARY A: Acute hypoxic respiratory failure 2nd to HCAP and Rt pleural effusion. Tobacco abuse.  Failed sbt 3/17 due to encephlopathy  P:   Full vent suppor Daily sBT trials Chest tube to -20 cm suction F/u pleural fluid analysis F/u CXR Prn BDs  CARDIOVASCULAR A:  Hypotension 2nd to sedation. Chronic combined CHF. Hx of HTN, HLD.   - needing levophed as of 05/22/15/ s/p lasix 3/16 P:  ASA Wean off pressors to keep MAP > 65 Hold outpt coreg, mevacor  RENAL A:   CKD stage II. Lactic acidosis.   - mild hypomag 05/22/15 P:   Replete mag Monitor renal fx, urine outpt  GASTROINTESTINAL A:   Nutrition. Hx of cirrhosis.   - on TF. On lactulose with only 1 BM x 24h  P:   Tube feeds while on vent Increase lactulose Start 7d Xifaxan 05/22/15 for Rx of hepatic enceph  Protonix for SUP F/u LFTs  HEMATOLOGIC A:   Anemia of critical illness and chronic disease. P:  F/u CBC Lovenox for DVT prevention  INFECTIOUS A:   HCAP with Rt pleural effusion. GPC in clusters in blood culture from 3/14. P:   Day 3 of vancomycin, cefepime   ENDOCRINE A:   DM. P:   SSI with levemir  NEUROLOGIC A:   Acute metabolic encephalopathy >> ammonia 67 from 3/15.  - hypoactive delirium continues 05/22/15  P:   RASS goal: 0 Prn versed, fentanyl DC bupropion Hold outpt  neurontin Continue lactulose but increase dose 05/22/15 7d xifaxan starting 05/22/15 for Rx of Hep Enceph (avoid diprivan during this time due to propylene glycol toxicity and ativan gtt)   FAMILY 3/17 - none at bedsid   The patient is critically ill with multiple organ systems failure  and requires high complexity decision making for assessment and support, frequent evaluation and titration of therapies, application of advanced monitoring technologies and extensive interpretation of multiple databases.   Critical Care Time devoted to patient care services described in this note is  30  Minutes. This time reflects time of care of this signee Dr Brand Males. This critical care time does not reflect procedure time, or teaching time or supervisory time of PA/NP/Med student/Med Resident etc but could involve care discussion time    Dr. Brand Males, M.D., [facility].C.P Pulmonary and Critical Care Medicine Staff Physician [place] [facility] Pager: [phone], If no answer or between  15:00h - 7:00h: call [phone]  05/22/2015 9:38 AM

## 2015-05-23 ENCOUNTER — Inpatient Hospital Stay (HOSPITAL_COMMUNITY): Payer: Medicare HMO

## 2015-05-23 DIAGNOSIS — J948 Other specified pleural conditions: Secondary | ICD-10-CM

## 2015-05-23 DIAGNOSIS — K7460 Unspecified cirrhosis of liver: Secondary | ICD-10-CM

## 2015-05-23 DIAGNOSIS — J189 Pneumonia, unspecified organism: Secondary | ICD-10-CM

## 2015-05-23 LAB — BASIC METABOLIC PANEL
ANION GAP: 9 (ref 5–15)
BUN: 49 mg/dL — ABNORMAL HIGH (ref 6–20)
CALCIUM: 9.1 mg/dL (ref 8.9–10.3)
CHLORIDE: 115 mmol/L — AB (ref 101–111)
CO2: 24 mmol/L (ref 22–32)
Creatinine, Ser: 1.56 mg/dL — ABNORMAL HIGH (ref 0.44–1.00)
GFR calc non Af Amer: 30 mL/min — ABNORMAL LOW (ref >60)
GFR, EST AFRICAN AMERICAN: 35 mL/min — AB (ref >60)
GLUCOSE: 161 mg/dL — AB (ref 65–99)
Potassium: 3.5 mmol/L (ref 3.5–5.1)
Sodium: 148 mmol/L — ABNORMAL HIGH (ref 135–145)

## 2015-05-23 LAB — CULTURE, RESPIRATORY
CULTURE: NO GROWTH
SPECIAL REQUESTS: NORMAL

## 2015-05-23 LAB — LACTIC ACID, PLASMA: Lactic Acid, Venous: 1.4 mmol/L (ref 0.5–2.0)

## 2015-05-23 LAB — GLUCOSE, CAPILLARY
GLUCOSE-CAPILLARY: 152 mg/dL — AB (ref 65–99)
GLUCOSE-CAPILLARY: 164 mg/dL — AB (ref 65–99)
GLUCOSE-CAPILLARY: 163 mg/dL — AB (ref 65–99)
GLUCOSE-CAPILLARY: 169 mg/dL — AB (ref 65–99)
GLUCOSE-CAPILLARY: 144 mg/dL — AB (ref 65–99)

## 2015-05-23 LAB — PHOSPHORUS: PHOSPHORUS: 3.6 mg/dL (ref 2.5–4.6)

## 2015-05-23 LAB — MAGNESIUM: Magnesium: 2 mg/dL (ref 1.7–2.4)

## 2015-05-23 LAB — CULTURE, RESPIRATORY W GRAM STAIN

## 2015-05-23 LAB — VANCOMYCIN, TROUGH: Vancomycin Tr: 26 ug/mL (ref 10.0–20.0)

## 2015-05-23 MED ORDER — SODIUM CHLORIDE 0.9 % IV SOLN
500.0000 mg | INTRAVENOUS | Status: DC
  Filled 2015-05-23: qty 500

## 2015-05-23 MED ORDER — VANCOMYCIN HCL 500 MG IV SOLR
500.0000 mg | INTRAVENOUS | Status: AC
  Administered 2015-05-24 – 2015-05-27 (×4): 500 mg via INTRAVENOUS
  Filled 2015-05-23 (×4): qty 500

## 2015-05-23 MED ORDER — SODIUM CHLORIDE 0.9% FLUSH
10.0000 mL | Freq: Two times a day (BID) | INTRAVENOUS | Status: DC
  Administered 2015-05-23 – 2015-05-25 (×4): 10 mL
  Administered 2015-05-26: 20 mL
  Administered 2015-05-26 – 2015-05-28 (×2): 10 mL

## 2015-05-23 MED ORDER — LACTULOSE ENEMA
300.0000 mL | Freq: Every day | ORAL | Status: DC
  Administered 2015-05-23 – 2015-05-24 (×3): 300 mL via RECTAL
  Filled 2015-05-23 (×6): qty 300

## 2015-05-23 MED ORDER — HEPARIN SODIUM (PORCINE) 5000 UNIT/ML IJ SOLN
5000.0000 [IU] | Freq: Three times a day (TID) | INTRAMUSCULAR | Status: DC
  Administered 2015-05-23 – 2015-05-29 (×18): 5000 [IU] via SUBCUTANEOUS
  Filled 2015-05-23 (×17): qty 1

## 2015-05-23 MED ORDER — SODIUM CHLORIDE 0.9% FLUSH
10.0000 mL | INTRAVENOUS | Status: DC | PRN
  Administered 2015-05-27 (×2): 20 mL
  Administered 2015-05-28: 10 mL
  Administered 2015-05-28: 20 mL
  Administered 2015-05-28: 10 mL
  Administered 2015-05-29: 20 mL
  Filled 2015-05-23 (×6): qty 40

## 2015-05-23 NOTE — Progress Notes (Signed)
Antibiotic CONSULT NOTE - Follow Up Consult  Pharmacy Consult for Vancomycin Indication: PNA, cellulitis  Allergies  Allergen Reactions  . Peanut-Containing Drug Products Nausea And Vomiting    Patient Measurements: Height: 5\' 6"  (167.6 cm) Weight: 187 lb 13.3 oz (85.2 kg) IBW/kg (Calculated) : 59.3  Vital Signs: Temp: 97.9 F (36.6 C) (03/18 1600) Temp Source: Oral (03/18 1600) BP: 97/50 mmHg (03/18 1943) Pulse Rate: 94 (03/18 1943)  Labs:  Recent Labs  05/21/15 0105 05/21/15 0350 05/22/15 0500 05/23/15 0412  HGB 8.9* 9.1* 8.9*  --   HCT 27.1* 27.1* 27.0*  --   PLT 252 248 215  --   CREATININE  --  1.63* 1.65* 1.56*    Estimated Creatinine Clearance: 32.2 mL/min (by C-G formula based on Cr of 1.56).   Assessment: ID: afeb, wbc 6.8, sepsis from PNA vs cellulitis vs decub ulcers, pct 0.31. Scr down to 1.56 slightly  3/15 vanc > --3/18 VT 26, decrease to 500mg  IV q24h 3/15 cefeipme >  3/14 blood cx>> 1/2 enterococcus (panS) - ID doubts significance 3/14 urine cx>> ngF 3/15 TA>> ngtd 3/16 pleural fluid>>ngtd  Goal of Therapy:  Vanco trough 15-20   Plan:  Hold dose tonight, then in 12 hrs start... Decrease Vancomycin to 500mg  IV q24h   Coriana Angello S. Alford Highland, PharmD, BCPS Clinical Staff Pharmacist Pager [phone]  Eilene Ghazi Stillinger 05/23/2015,8:16 PM

## 2015-05-23 NOTE — Progress Notes (Signed)
PULMONARY / CRITICAL CARE MEDICINE   Name: Tamara Stephenson MRN: [id number] DOB: 11-10-1933    ADMISSION DATE:  05/19/2015 CONSULTATION DATE:  05/20/2015  REFERRING MD:  Triad  CHIEF COMPLAINT:  Short of breath  BRIEF:  82 yo female smoker from assisted living with fall.  She was confused, hypothermic, and had elevated lactic acid.  She was found to have large Rt pleural effusion.  She required intubation for acute hypoxic respiratory failure. She had recent tx for Pna, cellulitis, decubitus ulcers at [facility].She has hx of cirrhosis, DM, systolic/diastolic CHF, CKD.  STUDIES: TTE 3/17:  Mild LVH. EF 65-70%. No wall motion abnormality. Grade 1 diastolic dysfunction. LA mildly dilated & RA normal in size. RV normal in size & function. No aortic stenosis or regurg. Trivial mitral regurg w/o stenosis.No pulmonic stenosis. Trivial tricuspid regurg. No pericardial effusion.  Port CXR 3/18:  Patchy bilateral opacities worse in lower lung zones. ETT & chest tube in good position. R IJ in good position.  MICROBIOLOGY: Right Pleural Fluid AFB Ctx 3/15>>> Right Pleural Fluid Fungal Ctx 3/15>>> Right Pleural Fluid Ctx 3/15>>> Tracheal Asp Ctx 3/15>>> MRSA PCR 3/15:  Negative Urine Strep Ag 3/15:  Negative Urine Legionella Ag 3/15:  Negative   Urine Ctx 3/14:  Negative  Blood Ctx x2 3/14:  Enterococcus 1/2  ANTIBIOTICS: Vancomycin 3/14 >> Cefepime 3/14 >>   LINES/TUBES: OETT 7.5 3/15>> R IJ CVL 3/15 >> R chest tube 30fr 3/15 >> Foley 3/15>> OGT 3/15>>  SIGNIFICANT EVENTS: 3/14 Admit 3/14 CT head >> diffuse atrophy, chronic white matter ischemic changes 3/14 CT chest >> Rt pleural effusion with compressive ATX of Rt lung 3/14 EEG >> diffuse slowing 3/15 Rt arm doppler >> no DVT 3/15 Palliative care consult >> full code  SUBJECTIVE: No acute events overnight. Patient still has not had BM. Patient nods no to any pain or difficulty breathing at this time.  REVIEW OF SYSTEMS:  Unable to  obtain given intubation & sedation.  VITAL SIGNS: BP 98/51 mmHg  Pulse 92  Temp(Src) 98 F (36.7 C) (Oral)  Resp 23  Ht 5\' 6"  (1.676 m)  Wt 187 lb 13.3 oz (85.2 kg)  BMI 30.33 kg/m2  SpO2 97%  VENTILATOR SETTINGS: Vent Mode:  [-] CPAP;PSV FiO2 (%):  [0 %-30 %] 30 % Set Rate:  [16 bmp] 16 bmp Vt Set:  [470 mL] 470 mL PEEP:  [5 cmH20] 5 cmH20 Pressure Support:  [5 cmH20-10 cmH20] 10 cmH20 Plateau Pressure:  [17 cmH20-19 cmH20] 17 cmH20  INTAKE / OUTPUT: I/O last 3 completed shifts: In: 2653.7 [I.V.:823.7; NG/GT:1580; IV Piggyback:250] Out: [id number] [Urine:2060; Chest Tube:1090]  PHYSICAL EXAMINATION: General:  Eyes open. No acute distress. Appears comfortable.  Integument:  Warm & dry. No rash on exposed skin.  HEENT:  No scleral injection. Endotracheal tube in place.  Cardiovascular:  Regular rate. Trace lower extremity edema. No appreciable JVD.  Pulmonary:  Good aeration bilaterally. Symmetric chest wall rise on ventilator. Abdomen: Soft. Normal bowel sounds. Protuberant. Hypoactive bowel sounds. Neurological: Patient nods to questions. We will still is on command. Appears grossly nonfocal.   LABS:   PULMONARY  Recent Labs Lab 05/20/15 1807  PHART 7.317*  PCO2ART 40.9  PO2ART 108.0*  HCO3 20.9  TCO2 22  O2SAT 98.0    CBC  Recent Labs Lab 05/21/15 0105 05/21/15 0350 05/22/15 0500  HGB 8.9* 9.1* 8.9*  HCT 27.1* 27.1* 27.0*  WBC 13.3* 12.5* 6.8  PLT 252 248 215  COAGULATION  Recent Labs Lab 05/20/15 0228  INR 1.17    CARDIAC    Recent Labs Lab 05/20/15 0228 05/20/15 1133 05/20/15 2006  TROPONINI 0.13* 0.42* 0.40*   No results for input(s): PROBNP in the last 168 hours.   CHEMISTRY  Recent Labs Lab 05/19/15 1741 05/20/15 0228 05/20/15 1905 05/21/15 0350 05/22/15 0500 05/23/15 0412  NA 144 145  --  144 145 148*  K 4.7 4.2  --  4.1 3.5 3.5  CL 112* 113*  --  114* 116* 115*  CO2 23 22  --  21* 23 24  GLUCOSE 99 83  --  153*  156* 161*  BUN 46* 45*  --  49* 48* 49*  CREATININE 1.62* 1.55*  --  1.63* 1.65* 1.56*  CALCIUM 9.3 8.8*  --  8.8* 8.9 9.1  MG  --   --  1.8 1.8 1.8 2.0  PHOS  --   --  4.8* 4.4 4.0 3.6   Estimated Creatinine Clearance: 32.2 mL/min (by C-G formula based on Cr of 1.56).   LIVER  Recent Labs Lab 05/19/15 1741 05/20/15 0228 05/20/15 1905 05/22/15 0500  AST 78* 78*  --  59*  ALT 28 27  --  25  ALKPHOS 202* 185*  --  168*  BILITOT 1.7* 1.7*  --  1.6*  PROT 6.2* 5.7* 6.0* 5.6*  ALBUMIN 1.8* 1.6*  --  1.5*  INR  --  1.17  --   --      INFECTIOUS  Recent Labs Lab 05/19/15 2050 05/20/15 0228 05/23/15 0413  LATICACIDVEN 1.90 1.8  1.7 1.4  PROCALCITON  --  0.31  --      ENDOCRINE CBG (last 3)   Recent Labs  05/23/15 0353 05/23/15 0759 05/23/15 1121  GLUCAP 152* 164* 163*         IMAGING x48h  - image(s) personally visualized  -   highlighted in bold Dg Chest Port 1 View  05/23/2015  CLINICAL DATA:  Intubated EXAM: PORTABLE CHEST 1 VIEW COMPARISON:  Chest radiograph from one day prior. FINDINGS: Endotracheal tube tip is 2.5 cm above the carina. Enteric tube enters the stomach with the tip not seen on this image. Right internal jugular central venous catheter terminates in the lower third of the superior vena cava. Stable cardiomediastinal silhouette with mild cardiomegaly. No pneumothorax. Stable small bilateral pleural effusions. Patchy bibasilar lung opacities are not appreciably changed. Low lung volumes with vascular crowding and no overt pulmonary edema. IMPRESSION: 1. Well-positioned support structures as described. 2. Stable small bilateral pleural effusions. 3. Stable patchy bibasilar lung consolidation, favor multifocal pneumonia. Electronically Signed   By: Ilona Sorrel M.D.   On: 05/23/2015 09:32   Dg Chest Port 1 View  05/22/2015  CLINICAL DATA:  Respiratory failure EXAM: PORTABLE CHEST 1 VIEW COMPARISON:  05/21/2015 FINDINGS: Endotracheal tube, central  line and right basilar chest tube remain in place, unchanged. NG tube is again noted in the stomach. Bibasilar airspace opacities with small effusions, similar to prior study. No pneumothorax. Cardiomegaly with vascular congestion. IMPRESSION: Stable bibasilar opacities and small effusions. Vascular congestion. No change. Electronically Signed   By: Rolm Baptise M.D.   On: 05/22/2015 08:12    ASSESSMENT / PLAN:  PULMONARY A: Acute Hypoxic Respiratory Failure - Secondary to HCAP. HCAP Right Pleural Effusion - S/P Chest Tube 3/15.  Tobacco Use  P:   Full vent support Daily WUA & SBT Chest tube to -20 cm suction  CARDIOVASCULAR A:  Hypotension - Resolved. Likely secondary to sedation & positive pressure. Chronic CHF H/O HTN & Dyslipidemia  P:  ASA 81mg  daily Wean off pressors to keep MAP > 65 Hold outpt coreg, mevacor  RENAL A:   Hypernatremia - Mild. CKD Stage II Lactic acidosis - Resolved.  P:   Trending UOP Monitoring renal function & electrolytes daily Replace electrolytes as indicated  GASTROINTESTINAL A:   Constipation H/O Liver Cirrhosis  P:   Continuing Tube Feedings Protonix VT daily D/C Lactulose VT Lactulose Enema daily Check KUB  HEMATOLOGIC A:   Anemia - No signs of active bleeding. Likely due to chronic disease.  P:  Trending cell counts daily w/ CBC D/C Lovenox Heparin [place] q8hr SCDs  INFECTIOUS A:   HCAP  Parapneumonic Right Effusion Enterococcus Bacteremia - TTE negative.  P:   Day#4 Vancomycin & Cefepime Follow cultures to completion  ENDOCRINE A:   H/O DM - BG controlled.  P:   Levemir 5u [place] daily Accu-Checks q4hr SSI per algorithm  NEUROLOGIC A:   Acute Encephalopathy - Hepatic versus Toxic Metabolic  P:   RASS goal: 0 Prn versed, fentanyl Hold Bupropion & Neurontin D/C Xifaxan & Lactulose VT Start Lactulose enema qday  Family Discussion:  No family at bedside.  TODAY'S SUMMARY:  82 yo female who fell at  assisted living facility recently. Admitted w/ acute hypoxic respiratory failure now intubated with a chest tube in place for pleural effusion. Lack of bowel movement despite lactulose via tube is an ongoing issue. [place] lactulose enema. Checking KUB. Continuing broad-spectrum antibiotic coverage with vancomycin & cefepime awaiting culture results.  I have spent a total of 32 minutes of critical care time today caring for the patient and reviewing the patient's electronic medical record.  Sonia Baller Ashok Cordia, M.D. [facility] Pulmonary & Critical Care Pager:  [phone] After 3pm or if no response, call [phone] 1:43 PM 05/23/2015

## 2015-05-23 NOTE — Progress Notes (Signed)
RT called to room via RN on phone. Stated PT had pulled on ETT. RT found PT to have ETT at 19cm lip with tube holder pulled from face, but still attached around PT head. RT advanced ETT back to 22 cm lip (as originally placed). PT is currently receiving VT, Sp02 95%, bilateral breath sounds. RN to order chest xray to confirm placement.

## 2015-05-24 DIAGNOSIS — E0821 Diabetes mellitus due to underlying condition with diabetic nephropathy: Secondary | ICD-10-CM

## 2015-05-24 DIAGNOSIS — E0865 Diabetes mellitus due to underlying condition with hyperglycemia: Secondary | ICD-10-CM

## 2015-05-24 DIAGNOSIS — Z789 Other specified health status: Secondary | ICD-10-CM

## 2015-05-24 LAB — AMMONIA: Ammonia: 61 umol/L — ABNORMAL HIGH (ref 9–35)

## 2015-05-24 LAB — BODY FLUID CULTURE: Culture: NO GROWTH

## 2015-05-24 LAB — CBC WITH DIFFERENTIAL/PLATELET
Basophils Absolute: 0 10*3/uL (ref 0.0–0.1)
Basophils Relative: 1 %
EOS ABS: 0.4 10*3/uL (ref 0.0–0.7)
EOS PCT: 5 %
HCT: 25.4 % — ABNORMAL LOW (ref 36.0–46.0)
Hemoglobin: 8.4 g/dL — ABNORMAL LOW (ref 12.0–15.0)
LYMPHS ABS: 2.3 10*3/uL (ref 0.7–4.0)
LYMPHS PCT: 29 %
MCH: 32.9 pg (ref 26.0–34.0)
MCHC: 33.1 g/dL (ref 30.0–36.0)
MCV: 99.6 fL (ref 78.0–100.0)
MONO ABS: 1.1 10*3/uL — AB (ref 0.1–1.0)
MONOS PCT: 14 %
Neutro Abs: 4.1 10*3/uL (ref 1.7–7.7)
Neutrophils Relative %: 51 %
PLATELETS: 175 10*3/uL (ref 150–400)
RBC: 2.55 MIL/uL — ABNORMAL LOW (ref 3.87–5.11)
RDW: 19.2 % — AB (ref 11.5–15.5)
WBC: 8 10*3/uL (ref 4.0–10.5)

## 2015-05-24 LAB — GLUCOSE, CAPILLARY
GLUCOSE-CAPILLARY: 111 mg/dL — AB (ref 65–99)
GLUCOSE-CAPILLARY: 119 mg/dL — AB (ref 65–99)
Glucose-Capillary: 95 mg/dL (ref 65–99)
Glucose-Capillary: 110 mg/dL — ABNORMAL HIGH (ref 65–99)
Glucose-Capillary: 79 mg/dL (ref 65–99)

## 2015-05-24 LAB — CULTURE, BLOOD (ROUTINE X 2): CULTURE: NO GROWTH

## 2015-05-24 LAB — RENAL FUNCTION PANEL
ALBUMIN: 1.4 g/dL — AB (ref 3.5–5.0)
Anion gap: 5 (ref 5–15)
BUN: 53 mg/dL — AB (ref 6–20)
CALCIUM: 9.1 mg/dL (ref 8.9–10.3)
CO2: 23 mmol/L (ref 22–32)
CREATININE: 1.49 mg/dL — AB (ref 0.44–1.00)
Chloride: 118 mmol/L — ABNORMAL HIGH (ref 101–111)
GFR, EST AFRICAN AMERICAN: 37 mL/min — AB (ref >60)
GFR, EST NON AFRICAN AMERICAN: 32 mL/min — AB (ref >60)
Glucose, Bld: 122 mg/dL — ABNORMAL HIGH (ref 65–99)
PHOSPHORUS: 3.4 mg/dL (ref 2.5–4.6)
Potassium: 3.6 mmol/L (ref 3.5–5.1)
Sodium: 146 mmol/L — ABNORMAL HIGH (ref 135–145)

## 2015-05-24 LAB — MAGNESIUM: Magnesium: 2.2 mg/dL (ref 1.7–2.4)

## 2015-05-24 MED ORDER — DEXTROSE 50 % IV SOLN
INTRAVENOUS | Status: AC
  Filled 2015-05-24: qty 50

## 2015-05-24 NOTE — Progress Notes (Signed)
PULMONARY / CRITICAL CARE MEDICINE   Name: Tamara Stephenson MRN: [id number] DOB: [birth year]    ADMISSION DATE:  05/19/2015 CONSULTATION DATE:  05/20/2015  REFERRING MD:  [facility]ad  CHIEF COMPLAINT:  Short of breath  BRIEF:  82 yo female smoker from assisted living with fall.  She was confused, hypothermic, and had elevated lactic acid.  She was found to have large Rt pleural effusion.  She required intubation for acute hypoxic respiratory failure. She had recent tx for Pna, cellulitis, decubitus ulcers at [facility].She has hx of cirrhosis, DM, systolic/diastolic CHF, CKD.  STUDIES: TTE 3/17:  Mild LVH. EF 65-70%. No wall motion abnormality. Grade 1 diastolic dysfunction. LA mildly dilated & RA normal in size. RV normal in size & function. No aortic stenosis or regurg. Trivial mitral regurg w/o stenosis.No pulmonic stenosis. Trivial tricuspid regurg. No pericardial effusion.  Port CXR 3/18:  Patchy bilateral opacities worse in lower lung zones. ETT & chest tube in good position. R IJ in good position.  MICROBIOLOGY: Right Pleural Fluid AFB Ctx 3/15>>> Right Pleural Fluid Fungal Ctx 3/15>>> Right Pleural Fluid Ctx 3/15>>> negative  Tracheal Asp Ctx 3/15>>> negative MRSA PCR 3/15:  Negative Urine Strep Ag 3/15:  Negative Urine Legionella Ag 3/15:  Negative   Urine Ctx 3/14:  Negative  Blood Ctx x2 3/14:  Enterococcus 1/2  ANTIBIOTICS: Vancomycin 3/14 >> Cefepime 3/14 >>   LINES/TUBES: OETT 7.5 3/15>> R IJ CVL 3/15 >> R chest tube 72fr 3/15 >> Foley 3/15>> OGT 3/15>>  SIGNIFICANT EVENTS: 3/14 Admit 3/14 CT head >> diffuse atrophy, chronic white matter ischemic changes 3/14 CT chest >> Rt pleural effusion with compressive ATX of Rt lung 3/14 EEG >> diffuse slowing 3/15 Rt arm doppler >> no DVT 3/15 Palliative care consult >> full code  SUBJECTIVE: No acute events overnight. Patient still has not had BM. Patient nods no to any pain or difficulty breathing at this time.  REVIEW OF  SYSTEMS:  Unable to obtain given intubation & sedation.  VITAL SIGNS: BP 99/46 mmHg  Pulse 87  Temp(Src) 97.9 F (36.6 C) (Oral)  Resp 14  Ht 5\' 6"  (1.676 m)  Wt 84.5 kg (186 lb 4.6 oz)  BMI 30.08 kg/m2  SpO2 99%  VENTILATOR SETTINGS: Vent Mode:  [-] CPAP;PSV FiO2 (%):  [25 %-30 %] 25 % Set Rate:  [16 bmp] 16 bmp Vt Set:  [470 mL] 470 mL PEEP:  [5 cmH20] 5 cmH20 Pressure Support:  [10 cmH20] 10 cmH20 Plateau Pressure:  [13 cmH20-18 cmH20] 13 cmH20  INTAKE / OUTPUT: I/O last 3 completed shifts: In: 1953.2 [I.V.:783.2; Other:360; NG/GT:760; IV Piggyback:50] Out: [id number]; Emesis/NG output:30; Chest Tube:2840]  PHYSICAL EXAMINATION: General:  Eyes open. No acute distress. Appears comfortable.  Integument:  Warm & dry. No rash on exposed skin.  HEENT:  No scleral injection. Endotracheal tube in place.  Cardiovascular:  Regular rate. Trace lower extremity edema. No appreciable JVD.  Pulmonary:  Good aeration bilaterally. Symmetric chest wall rise on ventilator. Abdomen: Soft. Normal bowel sounds. Protuberant. Hypoactive bowel sounds. Neurological: Patient nods to questions. We will still is on command. Appears grossly nonfocal.   LABS:   PULMONARY  Recent Labs Lab 05/20/15 1807  PHART 7.317*  PCO2ART 40.9  PO2ART 108.0*  HCO3 20.9  TCO2 22  O2SAT 98.0    CBC  Recent Labs Lab 05/21/15 0350 05/22/15 0500 05/24/15 0345  HGB 9.1* 8.9* 8.4*  HCT 27.1* 27.0* 25.4*  WBC 12.5* 6.8 8.0  PLT 248  [place] Lab 05/20/15 0228  INR 1.17    CARDIAC    Recent Labs Lab 05/20/15 0228 05/20/15 1133 05/20/15 2006  TROPONINI 0.13* 0.42* 0.40*   No results for input(s): PROBNP in the last 168 hours.   CHEMISTRY  Recent Labs Lab 05/20/15 0228 05/20/15 1905 05/21/15 0350 05/22/15 0500 05/23/15 0412 05/24/15 0345  NA 145  --  144 145 148* 146*  K 4.2  --  4.1 3.5 3.5 3.6  CL 113*  --  114* 116* 115* 118*  CO2 22   --  21* 23 24 23   GLUCOSE 83  --  153* 156* 161* 122*  BUN 45*  --  49* 48* 49* 53*  CREATININE 1.55*  --  1.63* 1.65* 1.56* 1.49*  CALCIUM 8.8*  --  8.8* 8.9 9.1 9.1  MG  --  1.8 1.8 1.8 2.0 2.2  PHOS  --  4.8* 4.4 4.0 3.6 3.4   Estimated Creatinine Clearance: 33.5 mL/min (by C-G formula based on Cr of 1.49).   LIVER  Recent Labs Lab 05/19/15 1741 05/20/15 0228 05/20/15 1905 05/22/15 0500 05/24/15 0345  AST 78* 78*  --  59*  --   ALT 28 27  --  25  --   ALKPHOS 202* 185*  --  168*  --   BILITOT 1.7* 1.7*  --  1.6*  --   PROT 6.2* 5.7* 6.0* 5.6*  --   ALBUMIN 1.8* 1.6*  --  1.5* 1.4*  INR  --  1.17  --   --   --      INFECTIOUS  Recent Labs Lab 05/19/15 2050 05/20/15 0228 05/23/15 0413  LATICACIDVEN 1.90 1.8  1.7 1.4  PROCALCITON  --  0.31  --      ENDOCRINE CBG (last 3)   Recent Labs  05/24/15 0446 05/24/15 0744 05/24/15 1115  GLUCAP 119* 95 110*         IMAGING x48h Dg Chest Port 1 View  05/23/2015  CLINICAL DATA:  Status post endotracheal placement EXAM: PORTABLE CHEST 1 VIEW COMPARISON:  05/23/2015 FINDINGS: Endotracheal tube is again identified 2.5 cm above the carina. A right jugular line and nasogastric catheter are noted in satisfactory position. Bibasilar changes are again identified and stable. No new focal abnormality is seen. IMPRESSION: No change from the previous exam.  Tubes and lines as described. Electronically Signed   By: Inez Catalina M.D.   On: 05/23/2015 17:25   Dg Chest Port 1 View  05/23/2015  CLINICAL DATA:  Intubated EXAM: PORTABLE CHEST 1 VIEW COMPARISON:  Chest radiograph from one day prior. FINDINGS: Endotracheal tube tip is 2.5 cm above the carina. Enteric tube enters the stomach with the tip not seen on this image. Right internal jugular central venous catheter terminates in the lower third of the superior vena cava. Stable cardiomediastinal silhouette with mild cardiomegaly. No pneumothorax. Stable small bilateral pleural  effusions. Patchy bibasilar lung opacities are not appreciably changed. Low lung volumes with vascular crowding and no overt pulmonary edema. IMPRESSION: 1. Well-positioned support structures as described. 2. Stable small bilateral pleural effusions. 3. Stable patchy bibasilar lung consolidation, favor multifocal pneumonia. Electronically Signed   By: Ilona Sorrel M.D.   On: 05/23/2015 09:32   Dg Abd Portable 1v  05/23/2015  CLINICAL DATA:  Pt here for respiratory failure and pleural effusion after a fall. Pt is now currently experiencing constipation. Hx liver cirrhosis, colitis EXAM: PORTABLE ABDOMEN -  1 VIEW COMPARISON:  05/20/2015 FINDINGS: Nasogastric tube is in place, tip overlying the level of the stomach. Bowel gas pattern is nonobstructed. There is a paucity of bowel gas within the right lower quadrant, raising the question of mass or ascites, displacing bowel loops. Significant degenerative changes are identified in the lumbar spine. Status post right hip arthroplasty. Right basilar chest tube. Bibasilar opacities obscure the hemidiaphragms. There are bilateral pleural effusions. IMPRESSION: 1. Significant degenerative changes in the spine. 2. Nonobstructed bowel-gas pattern. 3. Question of right lower quadrant mass or ascites displacing bowel loops. Electronically Signed   By: Nolon Nations M.D.   On: 05/23/2015 16:42    ASSESSMENT / PLAN:  PULMONARY A: Acute Hypoxic Respiratory Failure - Secondary to HCAP. HCAP Right Pleural Effusion - S/P Chest Tube 3/15.  Tobacco Use  P:   Full vent support Daily WUA & SBT Chest tube to -20 cm suction  CARDIOVASCULAR A:  Hypotension - Resolved. Likely secondary to sedation & positive pressure. Chronic CHF H/O HTN & Dyslipidemia  P:  ASA 81mg  daily Wean off pressors to keep MAP > 65 Hold outpt coreg, mevacor  RENAL A:   Hypernatremia - Mild. CKD Stage II Lactic acidosis - Resolved.  P:   Trending UOP Monitoring renal function &  electrolytes daily Replace electrolytes as indicated  GASTROINTESTINAL A:   Constipation H/O Liver Cirrhosis  P:   Continuing Tube Feedings Protonix VT daily D/C Lactulose VT Lactulose Enema daily Check KUB  HEMATOLOGIC A:   Anemia - No signs of active bleeding. Likely due to chronic disease.  P:  Trending cell counts daily w/ CBC D/C Lovenox Heparin [place] q8hr SCDs  INFECTIOUS A:   HCAP  Parapneumonic Right Effusion Enterococcus Bacteremia - TTE negative.  P:   Day#5 Vancomycin & Cefepime Follow cultures to completion  ENDOCRINE A:   H/O DM - BG controlled.  P:   Levemir 5u [place] daily Accu-Checks q4hr SSI per algorithm  NEUROLOGIC A:   Acute Encephalopathy - Hepatic versus Toxic Metabolic  P:   RASS goal: 0 Prn versed, fentanyl Hold Bupropion & Neurontin D/C Xifaxan & Lactulose VT Start Lactulose enema qday  Family Discussion:  No family at bedside.  TODAY'S SUMMARY:  82 yo female who fell at assisted living facility recently. Admitted w/ acute hypoxic respiratory failure now intubated with a chest tube in place for pleural effusion. Lack of bowel movement despite lactulose via tube is an ongoing issue. Ordered  lactulose enema. Checking KUB. Continuing broad-spectrum antibiotic coverage with vancomycin & cefepime. Cx's negative except enterococcus.   I have spent a total of 33 minutes of critical care time today caring for the patient and reviewing the patient's electronic medical record.  Baltazar Apo, MD, PhD 05/24/2015, 2:10 PM [place] [facility] [phone] or if no answer [phone]

## 2015-05-25 ENCOUNTER — Inpatient Hospital Stay (HOSPITAL_COMMUNITY): Payer: Medicare HMO

## 2015-05-25 LAB — CBC WITH DIFFERENTIAL/PLATELET
BASOS PCT: 1 %
Basophils Absolute: 0 10*3/uL (ref 0.0–0.1)
Eosinophils Absolute: 0.5 10*3/uL (ref 0.0–0.7)
Eosinophils Relative: 7 %
HEMATOCRIT: 27.1 % — AB (ref 36.0–46.0)
Hemoglobin: 8.6 g/dL — ABNORMAL LOW (ref 12.0–15.0)
Lymphocytes Relative: 31 %
Lymphs Abs: 2.1 10*3/uL (ref 0.7–4.0)
MCH: 31.9 pg (ref 26.0–34.0)
MCHC: 31.7 g/dL (ref 30.0–36.0)
MCV: 100.4 fL — ABNORMAL HIGH (ref 78.0–100.0)
MONO ABS: 1 10*3/uL (ref 0.1–1.0)
MONOS PCT: 15 %
NEUTROS ABS: 3.3 10*3/uL (ref 1.7–7.7)
Neutrophils Relative %: 46 %
Platelets: 173 10*3/uL (ref 150–400)
RBC: 2.7 MIL/uL — ABNORMAL LOW (ref 3.87–5.11)
RDW: 19.7 % — AB (ref 11.5–15.5)
WBC: 6.9 10*3/uL (ref 4.0–10.5)

## 2015-05-25 LAB — RENAL FUNCTION PANEL
ALBUMIN: 1.4 g/dL — AB (ref 3.5–5.0)
Anion gap: 8 (ref 5–15)
BUN: 50 mg/dL — AB (ref 6–20)
CHLORIDE: 116 mmol/L — AB (ref 101–111)
CO2: 24 mmol/L (ref 22–32)
Calcium: 9.3 mg/dL (ref 8.9–10.3)
Creatinine, Ser: 1.51 mg/dL — ABNORMAL HIGH (ref 0.44–1.00)
GFR calc Af Amer: 37 mL/min — ABNORMAL LOW (ref >60)
GFR calc non Af Amer: 32 mL/min — ABNORMAL LOW (ref >60)
GLUCOSE: 115 mg/dL — AB (ref 65–99)
PHOSPHORUS: 3.3 mg/dL (ref 2.5–4.6)
POTASSIUM: 3.6 mmol/L (ref 3.5–5.1)
Sodium: 148 mmol/L — ABNORMAL HIGH (ref 135–145)

## 2015-05-25 LAB — GLUCOSE, CAPILLARY
GLUCOSE-CAPILLARY: 101 mg/dL — AB (ref 65–99)
GLUCOSE-CAPILLARY: 102 mg/dL — AB (ref 65–99)
GLUCOSE-CAPILLARY: 106 mg/dL — AB (ref 65–99)
GLUCOSE-CAPILLARY: 107 mg/dL — AB (ref 65–99)
GLUCOSE-CAPILLARY: 73 mg/dL (ref 65–99)
GLUCOSE-CAPILLARY: 93 mg/dL (ref 65–99)
Glucose-Capillary: 110 mg/dL — ABNORMAL HIGH (ref 65–99)

## 2015-05-25 LAB — PH, BODY FLUID: pH, Body Fluid: 8.2

## 2015-05-25 LAB — MAGNESIUM: Magnesium: 2.2 mg/dL (ref 1.7–2.4)

## 2015-05-25 MED ORDER — DEXTROSE 50 % IV SOLN
1.0000 | Freq: Once | INTRAVENOUS | Status: AC
  Administered 2015-05-25: 50 mL via INTRAVENOUS

## 2015-05-25 MED ORDER — LACTULOSE 10 GM/15ML PO SOLN
20.0000 g | Freq: Three times a day (TID) | ORAL | Status: DC
  Administered 2015-05-28 – 2015-06-01 (×9): 20 g via ORAL
  Filled 2015-05-25 (×12): qty 30

## 2015-05-25 NOTE — Progress Notes (Signed)
Post extubation, patient is alert and oriented, discussed code status and patient did not wish for further resuscitative efforts.  Will make a full DNR.  Rush Farmer, M.D. [facility] Pulmonary/Critical Care Medicine. Pager: [phone]. After hours pager: [phone].

## 2015-05-25 NOTE — Progress Notes (Signed)
PULMONARY / CRITICAL CARE MEDICINE   Name: Tamara Stephenson MRN: [id number] DOB: 1933-10-29    ADMISSION DATE:  05/19/2015 CONSULTATION DATE:  05/20/2015  REFERRING MD:  [facility]  CHIEF COMPLAINT:  Short of breath  BRIEF:  82 yo female smoker from assisted living with fall.  She was confused, hypothermic, and had elevated lactic acid.  She was found to have large Rt pleural effusion.  She required intubation for acute hypoxic respiratory failure. She had recent tx for Pna, cellulitis, decubitus ulcers at [facility].She has hx of cirrhosis, DM, systolic/diastolic CHF, CKD.  STUDIES: TTE 3/17:  Mild LVH. EF 65-70%. No wall motion abnormality. Grade 1 diastolic dysfunction. LA mildly dilated & RA normal in size. RV normal in size & function. No aortic stenosis or regurg. Trivial mitral regurg w/o stenosis.No pulmonic stenosis. Trivial tricuspid regurg. No pericardial effusion.  Port CXR 3/18:  Patchy bilateral opacities worse in lower lung zones. ETT & chest tube in good position. R IJ in good position.  MICROBIOLOGY: Right Pleural Fluid AFB Ctx 3/15>>> Right Pleural Fluid Fungal Ctx 3/15>>> Right Pleural Fluid Ctx 3/15>>> negative  Tracheal Asp Ctx 3/15>>> negative MRSA PCR 3/15:  Negative Urine Strep Ag 3/15:  Negative Urine Legionella Ag 3/15:  Negative   Urine Ctx 3/14:  Negative  Blood Ctx x2 3/14:  Enterococcus 1/2  ANTIBIOTICS: Vancomycin 3/14 >> Cefepime 3/14 >>   LINES/TUBES: OETT 7.5 3/15>> R IJ CVL 3/15 >> R chest tube 38fr 3/15 >> Foley 3/15>> OGT 3/15>>  SIGNIFICANT EVENTS: 3/14 Admit 3/14 CT head >> diffuse atrophy, chronic white matter ischemic changes 3/14 CT chest >> Rt pleural effusion with compressive ATX of Rt lung 3/14 EEG >> diffuse slowing 3/15 Rt arm doppler >> no DVT 3/15 Palliative care consult >> full code 3/20 extubate  SUBJECTIVE: Awake and interactive, moving ext to command.  VITAL SIGNS: BP 114/51 mmHg  Pulse 89  Temp(Src) 97.7 F (36.5 C)  (Oral)  Resp 12  Ht 5\' 6"  (1.676 m)  Wt 85.3 kg (188 lb 0.8 oz)  BMI 30.37 kg/m2  SpO2 100%  VENTILATOR SETTINGS: Vent Mode:  [-] PSV;CPAP FiO2 (%):  [25 %-30 %] 30 % Set Rate:  [16 bmp] 16 bmp Vt Set:  [470 mL] 470 mL PEEP:  [5 cmH20] 5 cmH20 Pressure Support:  [5 cmH20-10 cmH20] 5 cmH20 Plateau Pressure:  [10 cmH20-16 cmH20] 16 cmH20  INTAKE / OUTPUT: I/O last 3 completed shifts: In: 960 [I.V.:730; NG/GT:30; IV Piggyback:200] Out: [id number] [Urine:1300; Emesis/NG output:30; Chest Tube:2440]  PHYSICAL EXAMINATION: General:  Eyes open. No acute distress. Appears comfortable.  Integument:  Warm & dry. No rash on exposed skin.  HEENT:  No scleral injection. Endotracheal tube in place.  Cardiovascular:  Regular rate. Trace lower extremity edema. No appreciable JVD.  Pulmonary:  Good aeration bilaterally. Symmetric chest wall rise on ventilator. Abdomen: Soft. Normal bowel sounds. Protuberant. Hypoactive bowel sounds. Neurological: Patient nods to questions. We will still is on command. Appears grossly nonfocal.   LABS:  PULMONARY  Recent Labs Lab 05/20/15 1807  PHART 7.317*  PCO2ART 40.9  PO2ART 108.0*  HCO3 20.9  TCO2 22  O2SAT 98.0   CBC  Recent Labs Lab 05/22/15 0500 05/24/15 0345 05/25/15 0430  HGB 8.9* 8.4* 8.6*  HCT 27.0* 25.4* 27.1*  WBC 6.8 8.0 6.9  PLT 215 175 173   COAGULATION  Recent Labs Lab 05/20/15 0228  INR 1.17   CARDIAC  Recent Labs Lab 05/20/15 0228 05/20/15 1133 05/20/15 2006  TROPONINI 0.13* 0.42* 0.40*   No results for input(s): PROBNP in the last 168 hours.  CHEMISTRY  Recent Labs Lab 05/21/15 0350 05/22/15 0500 05/23/15 0412 05/24/15 0345 05/25/15 0430  NA 144 145 148* 146* 148*  K 4.1 3.5 3.5 3.6 3.6  CL 114* 116* 115* 118* 116*  CO2 21* 23 24 23 24   GLUCOSE 153* 156* 161* 122* 115*  BUN 49* 48* 49* 53* 50*  CREATININE 1.63* 1.65* 1.56* 1.49* 1.51*  CALCIUM 8.8* 8.9 9.1 9.1 9.3  MG 1.8 1.8 2.0 2.2 2.2  PHOS  4.4 4.0 3.6 3.4 3.3   Estimated Creatinine Clearance: 33.2 mL/min (by C-G formula based on Cr of 1.51).  LIVER  Recent Labs Lab 05/19/15 1741 05/20/15 0228 05/20/15 1905 05/22/15 0500 05/24/15 0345 05/25/15 0430  AST 78* 78*  --  59*  --   --   ALT 28 27  --  25  --   --   ALKPHOS 202* 185*  --  168*  --   --   BILITOT 1.7* 1.7*  --  1.6*  --   --   PROT 6.2* 5.7* 6.0* 5.6*  --   --   ALBUMIN 1.8* 1.6*  --  1.5* 1.4* 1.4*  INR  --  1.17  --   --   --   --    INFECTIOUS  Recent Labs Lab 05/19/15 2050 05/20/15 0228 05/23/15 0413  LATICACIDVEN 1.90 1.8  1.7 1.4  PROCALCITON  --  0.31  --    ENDOCRINE CBG (last 3)   Recent Labs  05/24/15 2348 05/25/15 0437 05/25/15 0727  GLUCAP 73 107* 102*   IMAGING x48h Dg Chest Port 1 View  05/25/2015  CLINICAL DATA:  Acute respiratory failure, CHF, healthcare associated pneumonia, sepsis EXAM: PORTABLE CHEST 1 VIEW COMPARISON:  Portable chest x-ray of May 23, 2015 FINDINGS: The lungs are slightly better inflated today. There are bilateral pleural effusions and bibasilar atelectasis or pneumonia. The endotracheal tube tip lies 2.5 cm above the carina. The heart is normal in size. The central pulmonary vascularity is mildly prominent and slightly more conspicuous overall today. There is no pneumothorax. The esophagogastric tube tip projects below the inferior margin of the image. The right sided chest tube tip projects over the medial costophrenic angle and is stable in position. The right internal jugular venous catheter tip projects over the midportion of the SVC. There are degenerative changes of both shoulders. IMPRESSION: Improved aeration of both lungs. Persistent bibasilar atelectasis/ pneumonia with small pleural effusions. Mild central pulmonary vascular congestion is more conspicuous today. The support tubes are in stable position. Electronically Signed   By: David  Martinique M.D.   On: 05/25/2015 07:11   Dg Chest Port 1  View  05/23/2015  CLINICAL DATA:  Status post endotracheal placement EXAM: PORTABLE CHEST 1 VIEW COMPARISON:  05/23/2015 FINDINGS: Endotracheal tube is again identified 2.5 cm above the carina. A right jugular line and nasogastric catheter are noted in satisfactory position. Bibasilar changes are again identified and stable. No new focal abnormality is seen. IMPRESSION: No change from the previous exam.  Tubes and lines as described. Electronically Signed   By: Inez Catalina M.D.   On: 05/23/2015 17:25   Dg Abd Portable 1v  05/23/2015  CLINICAL DATA:  Pt here for respiratory failure and pleural effusion after a fall. Pt is now currently experiencing constipation. Hx liver cirrhosis, colitis EXAM: PORTABLE ABDOMEN - 1 VIEW COMPARISON:  05/20/2015 FINDINGS: Nasogastric tube is  in place, tip overlying the level of the stomach. Bowel gas pattern is nonobstructed. There is a paucity of bowel gas within the right lower quadrant, raising the question of mass or ascites, displacing bowel loops. Significant degenerative changes are identified in the lumbar spine. Status post right hip arthroplasty. Right basilar chest tube. Bibasilar opacities obscure the hemidiaphragms. There are bilateral pleural effusions. IMPRESSION: 1. Significant degenerative changes in the spine. 2. Nonobstructed bowel-gas pattern. 3. Question of right lower quadrant mass or ascites displacing bowel loops. Electronically Signed   By: Nolon Nations M.D.   On: 05/23/2015 16:42   ASSESSMENT / PLAN:  PULMONARY A: Acute Hypoxic Respiratory Failure - Secondary to HCAP. HCAP Right Pleural Effusion - S/P Chest Tube 3/15.  Tobacco Use  P:   Extubate today Need to discuss code status Daily WUA & SBT Chest tube to water seal. Swallow evaluation.  CARDIOVASCULAR A:  Hypotension - Resolved. Likely secondary to sedation & positive pressure. Chronic CHF H/O HTN & Dyslipidemia  P:  ASA 81mg  daily D/C levophed. Hold outpt coreg,  mevacor  RENAL A:   Hypernatremia - Mild. CKD Stage II Lactic acidosis - Resolved.  P:   Trending UOP Monitoring renal function & electrolytes daily Replace electrolytes as indicated  GASTROINTESTINAL A:   Constipation H/O Liver Cirrhosis  P:   D/C TF Protonix VT daily Change lactulose to PO. SLP 2 hours post extubation.  HEMATOLOGIC A:   Anemia - No signs of active bleeding. Likely due to chronic disease.  P:  Trending cell counts daily w/ CBC. D/C Lovenox. Heparin [place] q8hr. SCDs.  INFECTIOUS A:   HCAP  Parapneumonic Right Effusion Enterococcus Bacteremia - TTE negative.  P:   Day#6 Vancomycin & Cefepime. Follow cultures to completion.  ENDOCRINE A:   H/O DM - BG controlled.  P:   D/C Levemir. Accu-Checks q4hr. SSI per algorithm. Diet once passes SLP.  NEUROLOGIC A:   Acute Encephalopathy - Hepatic versus Toxic Metabolic  P:   RASS goal: 0. D/C versed, fentanyl. Hold Bupropion & Neurontin PO lactulose.  Family Discussion:  Need to discuss code status post extubation, MODS, palliative care involved..  The patient is critically ill with multiple organ systems failure and requires high complexity decision making for assessment and support, frequent evaluation and titration of therapies, application of advanced monitoring technologies and extensive interpretation of multiple databases.   Critical Care Time devoted to patient care services described in this note is  35  Minutes. This time reflects time of care of this signee Dr Jennet Maduro. This critical care time does not reflect procedure time, or teaching time or supervisory time of PA/NP/Med student/Med Resident etc but could involve care discussion time.  Rush Farmer, M.D. [facility] Pulmonary/Critical Care Medicine. Pager: [phone]. After hours pager: [phone].

## 2015-05-25 NOTE — Progress Notes (Signed)
I called and spoke with listed emergency contact this AM, Ziggie Corbette.  She reports that the patient had spoken with her in the past about wishes after her death (cremation, place on internment, etc).  She believes that Ms. Lanzi would likely want her to make medical decisions on her behalf, but this is not something that Ms. Coxson has ever pursued to legally establish.  Ms. Antony Odea is not her legal HCPOA, nor is there any completed Advance Directive, HCPOA, or other paperwork that Ms. Corbette is aware of.  She reports the only paperwork she has is an unsigned will that Ms. Volner completed 20+ years ago.  Patient extubated this AM.  Dr. Nelda Marseille discussed code status with patient and she is full DNR.    If she survives this hospitalization, she would benefit from advance care planning and completion of HCPOA paperwork to name a surrogate on her behalf.   Micheline Rough, MD [place] Team [phone]

## 2015-05-25 NOTE — Procedures (Signed)
Extubation Procedure Note  Patient Details:   Name: Tamara Stephenson DOB: [birth year] MRN: [id number]   Airway Documentation:     Evaluation  O2 sats: stable throughout Complications: No apparent complications Patient did tolerate procedure well. Bilateral Breath Sounds: Rhonchi, Diminished Suctioning: Airway Yes   Patient extubated to 2L nasal cannula per MD order.  Positive cuff leak noted.  No evidence of stridor.  Patient refused to speak once extubated.  Sats and vitals are currently stable.  No apparent complications.  Philomena Doheny 05/25/2015, 9:53 AM

## 2015-05-26 LAB — CBC WITH DIFFERENTIAL/PLATELET
BASOS ABS: 0 10*3/uL (ref 0.0–0.1)
Basophils Relative: 0 %
EOS ABS: 0.4 10*3/uL (ref 0.0–0.7)
EOS PCT: 6 %
HCT: 26.8 % — ABNORMAL LOW (ref 36.0–46.0)
Hemoglobin: 8.4 g/dL — ABNORMAL LOW (ref 12.0–15.0)
LYMPHS PCT: 36 %
Lymphs Abs: 2.5 10*3/uL (ref 0.7–4.0)
MCH: 31.6 pg (ref 26.0–34.0)
MCHC: 31.3 g/dL (ref 30.0–36.0)
MCV: 100.8 fL — ABNORMAL HIGH (ref 78.0–100.0)
Monocytes Absolute: 0.7 10*3/uL (ref 0.1–1.0)
Monocytes Relative: 11 %
Neutro Abs: 3.2 10*3/uL (ref 1.7–7.7)
Neutrophils Relative %: 47 %
PLATELETS: 170 10*3/uL (ref 150–400)
RBC: 2.66 MIL/uL — AB (ref 3.87–5.11)
RDW: 19.9 % — ABNORMAL HIGH (ref 11.5–15.5)
WBC: 6.9 10*3/uL (ref 4.0–10.5)

## 2015-05-26 LAB — BASIC METABOLIC PANEL
ANION GAP: 9 (ref 5–15)
BUN: 52 mg/dL — ABNORMAL HIGH (ref 6–20)
CO2: 22 mmol/L (ref 22–32)
Calcium: 9.3 mg/dL (ref 8.9–10.3)
Chloride: 119 mmol/L — ABNORMAL HIGH (ref 101–111)
Creatinine, Ser: 1.5 mg/dL — ABNORMAL HIGH (ref 0.44–1.00)
GFR calc Af Amer: 37 mL/min — ABNORMAL LOW (ref >60)
GFR, EST NON AFRICAN AMERICAN: 32 mL/min — AB (ref >60)
Glucose, Bld: 103 mg/dL — ABNORMAL HIGH (ref 65–99)
POTASSIUM: 3.7 mmol/L (ref 3.5–5.1)
SODIUM: 150 mmol/L — AB (ref 135–145)

## 2015-05-26 LAB — GLUCOSE, CAPILLARY
GLUCOSE-CAPILLARY: 101 mg/dL — AB (ref 65–99)
GLUCOSE-CAPILLARY: 172 mg/dL — AB (ref 65–99)
GLUCOSE-CAPILLARY: 180 mg/dL — AB (ref 65–99)
Glucose-Capillary: 112 mg/dL — ABNORMAL HIGH (ref 65–99)
Glucose-Capillary: 133 mg/dL — ABNORMAL HIGH (ref 65–99)
Glucose-Capillary: 87 mg/dL (ref 65–99)

## 2015-05-26 LAB — MAGNESIUM: MAGNESIUM: 2 mg/dL (ref 1.7–2.4)

## 2015-05-26 LAB — ALBUMIN: ALBUMIN: 1.3 g/dL — AB (ref 3.5–5.0)

## 2015-05-26 LAB — PHOSPHORUS: PHOSPHORUS: 3.3 mg/dL (ref 2.5–4.6)

## 2015-05-26 MED ORDER — INSULIN ASPART 100 UNIT/ML ~~LOC~~ SOLN: 0.0000 [IU] | Freq: Three times a day (TID) | SUBCUTANEOUS | Status: DC

## 2015-05-26 MED ORDER — CETYLPYRIDINIUM CHLORIDE 0.05 % MT LIQD
7.0000 mL | Freq: Two times a day (BID) | OROMUCOSAL | Status: DC
  Administered 2015-05-26 – 2015-06-01 (×9): 7 mL via OROMUCOSAL

## 2015-05-26 MED ORDER — RESOURCE THICKENUP CLEAR PO POWD
ORAL | Status: DC | PRN
  Filled 2015-05-26 (×2): qty 125

## 2015-05-26 MED ORDER — OXYCODONE-ACETAMINOPHEN 5-325 MG PO TABS
1.0000 | ORAL_TABLET | ORAL | Status: DC | PRN
Start: 2015-05-26 — End: 2015-05-29
  Administered 2015-05-26 (×2): 1 via ORAL
  Administered 2015-05-27: 2 via ORAL
  Administered 2015-05-27: 1 via ORAL
  Administered 2015-05-28: 2 via ORAL
  Administered 2015-05-29: 1 via ORAL
  Filled 2015-05-26 (×2): qty 2
  Filled 2015-05-26 (×4): qty 1

## 2015-05-26 MED ORDER — INSULIN ASPART 100 UNIT/ML ~~LOC~~ SOLN
0.0000 [IU] | Freq: Three times a day (TID) | SUBCUTANEOUS | Status: DC
  Administered 2015-05-26 – 2015-05-27 (×2): 3 [IU] via SUBCUTANEOUS
  Administered 2015-05-27: 5 [IU] via SUBCUTANEOUS
  Administered 2015-05-27 – 2015-05-28 (×2): 3 [IU] via SUBCUTANEOUS
  Administered 2015-05-28: 5 [IU] via SUBCUTANEOUS
  Administered 2015-05-28: 2 [IU] via SUBCUTANEOUS
  Administered 2015-05-29: 5 [IU] via SUBCUTANEOUS

## 2015-05-26 NOTE — Progress Notes (Signed)
MD notified of chest tube output. [place] container changed per protocol.  Pt to transfer to 2W tele.

## 2015-05-26 NOTE — Evaluation (Signed)
Clinical/Bedside Swallow Evaluation Patient Details  Name: Tamara Stephenson MRN: [id number] Date of Birth: February 06, 1934  Today's Date: 05/26/2015 Time: SLP Start Time (ACUTE ONLY): 0822 SLP Stop Time (ACUTE ONLY): 0841 SLP Time Calculation (min) (ACUTE ONLY): 19 min  Past Medical History:  Past Medical History  Diagnosis Date  . Back pain   . Hypertension   . Carotid stenosis   . Colitis   . Diabetes mellitus without complication ([place])   . Pneumonia    Past Surgical History:  Past Surgical History  Procedure Laterality Date  . Carotid endarterectomy    . Lumbar laminectomy for epidural abscess N/A 10/16/2013    Procedure: LUMBAR LAMINECTOMY FOR EPIDURAL ABSCESS Lumbar Two through Four;  Surgeon: Charlie Pitter, MD;  Location: [facility];  Service: Neurosurgery;  Laterality: N/A;  . Hip arthroplasty Right 01/08/2015    Procedure: RIGHT ANTERIOR APPROACH HEMI HIP ARTHROPLASTY;  Surgeon: Rod Can, MD;  Location: [facility];  Service: Orthopedics;  Laterality: Right;   HPI:  82 y.o. female with h/o DM type 2, cirrhosis of the liver, CHF, chronic kidney disease, HTN, carotid stenosis and chronic anemia, who presented to ED after being found on floor at ALF for unknown period of time. Pt was recently d/c from hospital 2 weeks ago after tx for PNA, lower extremity cellulitis and decubitus ulcers. Intubated for period of 5 days (3/15-3/20). CT Head 3/14 no acute intracranial abnormality seen. CXR 3/20 persistent bibasilar atelectasis/pneumonia with small pleural effusions. No prior h/o SLP intervention found in chart.   Assessment / Plan / Recommendation Clinical Impression  Pt exhibited immediate coughing x2 during small and consecutive sips of thin liquids. Further possible penetration/aspiration events prevented with intake of nectar thick liquids. Mastication WFL. She required encouragement to participate initially. Pt educated re: consequences of aspiration, importance of thickened liquids and  diet recommendation of Dysphagia 3 (mechanical soft) textures and nectar thick liquids (no straws) with meds whole in puree. SLP will continue to follow for diet tolerance and to determine possible need for instrumental assessment in future.    Aspiration Risk  Moderate aspiration risk    Diet Recommendation Dysphagia 3 (Mech soft);Nectar-thick liquid   Liquid Administration via: Cup;No straw Medication Administration: Whole meds with puree Supervision: Patient able to self feed;Staff to assist with self feeding;Full supervision/cueing for compensatory strategies Compensations: Minimize environmental distractions;Slow rate;Small sips/bites Postural Changes: Seated upright at 90 degrees    Other  Recommendations Oral Care Recommendations: Oral care BID Other Recommendations: Order thickener from pharmacy;Prohibited food (jello, ice cream, thin soups);Remove water pitcher   Follow up Recommendations   (TBD)    Frequency and Duration min 2x/week  2 weeks       Prognosis Prognosis for Safe Diet Advancement: Good Barriers to Reach Goals: Behavior      Swallow Study   General HPI: 82 y.o. female with h/o DM type 2, cirrhosis of the liver, CHF, chronic kidney disease, HTN, carotid stenosis and chronic anemia, who presented to ED after being found on floor at ALF for unknown period of time. Pt was recently d/c from hospital 2 weeks ago after tx for PNA, lower extremity cellulitis and decubitus ulcers. Intubated for period of 5 days (3/15-3/20). CT Head 3/14 no acute intracranial abnormality seen. CXR 3/20 persistent bibasilar atelectasis/pneumonia with small pleural effusions. No prior h/o SLP intervention found in chart. Type of Study: Bedside Swallow Evaluation Previous Swallow Assessment: see HPI Diet Prior to this Study: NPO Temperature Spikes Noted: No Respiratory Status:  Room air History of Recent Intubation: Yes Length of Intubations (days): 5 days Date extubated:  05/25/15 Behavior/Cognition: Alert;Cooperative;Requires cueing Oral Cavity Assessment: Within Functional Limits Oral Care Completed by SLP: No Oral Cavity - Dentition: Adequate natural dentition Vision: Functional for self-feeding Self-Feeding Abilities: Able to feed self;Needs assist;Needs set up Patient Positioning: Upright in bed Baseline Vocal Quality: Low vocal intensity Volitional Cough: Weak Volitional Swallow: Able to elicit    Oral/Motor/Sensory Function Overall Oral Motor/Sensory Function: Generalized oral weakness   Ice Chips Ice chips: Not tested   Thin Liquid Thin Liquid: Impaired Presentation: Cup;Self Fed Oral Phase Impairments:  (none) Oral Phase Functional Implications:  (none) Pharyngeal  Phase Impairments: Cough - Immediate    Nectar Thick Nectar Thick Liquid: Within functional limits Presentation: Cup;Self Fed   Honey Thick Honey Thick Liquid: Not tested   Puree Puree: Within functional limits Presentation: Spoon   Solid   GO   Solid: Within functional limits Presentation: Self Fed        Tian Mcmurtrey 05/26/2015,9:50 AM  Titus Mould, Student-SLP

## 2015-05-26 NOTE — Progress Notes (Signed)
PULMONARY / CRITICAL CARE MEDICINE   Name: Tamara Stephenson MRN: [id number] DOB: 30-Jul-1933    ADMISSION DATE:  05/19/2015 CONSULTATION DATE:  05/20/2015  REFERRING MD:  [facility]  CHIEF COMPLAINT:  Short of breath  BRIEF:  82 yo female smoker from assisted living with fall.  She was confused, hypothermic, and had elevated lactic acid.  She was found to have large Rt pleural effusion.  She required intubation for acute hypoxic respiratory failure. She had recent tx for Pna, cellulitis, decubitus ulcers at [facility].She has hx of cirrhosis, DM, systolic/diastolic CHF, CKD.  STUDIES: TTE 3/17:  Mild LVH. EF 65-70%. No wall motion abnormality. Grade 1 diastolic dysfunction. LA mildly dilated & RA normal in size. RV normal in size & function. No aortic stenosis or regurg. Trivial mitral regurg w/o stenosis.No pulmonic stenosis. Trivial tricuspid regurg. No pericardial effusion.  Port CXR 3/18:  Patchy bilateral opacities worse in lower lung zones. ETT & chest tube in good position. R IJ in good position.  MICROBIOLOGY: Right Pleural Fluid AFB Ctx 3/15>>> Right Pleural Fluid Fungal Ctx 3/15>>> Right Pleural Fluid Ctx 3/15>>> negative  Tracheal Asp Ctx 3/15>>> negative MRSA PCR 3/15:  Negative Urine Strep Ag 3/15:  Negative Urine Legionella Ag 3/15:  Negative   Urine Ctx 3/14:  Negative  Blood Ctx x2 3/14:  Enterococcus 1/2  ANTIBIOTICS: Vancomycin 3/14 >> Cefepime 3/14 >>   LINES/TUBES: OETT 7.5 3/15>> R IJ CVL 3/15 >> R chest tube 43fr 3/15 >> Foley 3/15>> OGT 3/15>>  SIGNIFICANT EVENTS: 3/14 Admit 3/14 CT head >> diffuse atrophy, chronic white matter ischemic changes 3/14 CT chest >> Rt pleural effusion with compressive ATX of Rt lung 3/14 EEG >> diffuse slowing 3/15 Rt arm doppler >> no DVT 3/15 Palliative care consult >> full code 3/20 extubate  SUBJECTIVE: Awake and interactive, moving ext to command.  VITAL SIGNS: BP 103/49 mmHg  Pulse 83  Temp(Src) 96.7 F (35.9 C)  (Axillary)  Resp 20  Ht 5\' 6"  (1.676 m)  Wt 80.5 kg (177 lb 7.5 oz)  BMI 28.66 kg/m2  SpO2 96%  VENTILATOR SETTINGS: Vent Mode:  [-] PSV;CPAP FiO2 (%):  [30 %] 30 % PEEP:  [5 cmH20] 5 cmH20 Pressure Support:  [5 cmH20] 5 cmH20  INTAKE / OUTPUT: I/O last 3 completed shifts: In: 70 [I.V.:390; IV Piggyback:200] Out: [id number] [Urine:1085; Chest Tube:4100]  PHYSICAL EXAMINATION: General:  Alert and interactive, moving all ext to command. Integument:  Warm & dry. No rash on exposed skin.  HEENT:  No scleral injection. Endotracheal tube in place.  Cardiovascular:  Regular rate. Trace lower extremity edema. No appreciable JVD.  Pulmonary:  Good aeration bilaterally. Symmetric chest wall rise on ventilator. Abdomen: Soft. Normal bowel sounds. Protuberant. Hypoactive bowel sounds. Neurological: Patient nods to questions. We will still is on command. Appears grossly nonfocal.   LABS:  PULMONARY  Recent Labs Lab 05/20/15 1807  PHART 7.317*  PCO2ART 40.9  PO2ART 108.0*  HCO3 20.9  TCO2 22  O2SAT 98.0   CBC  Recent Labs Lab 05/24/15 0345 05/25/15 0430 05/26/15 0600  HGB 8.4* 8.6* 8.4*  HCT 25.4* 27.1* 26.8*  WBC 8.0 6.9 6.9  PLT 175 173 170   COAGULATION  Recent Labs Lab 05/20/15 0228  INR 1.17   CARDIAC  Recent Labs Lab 05/20/15 0228 05/20/15 1133 05/20/15 2006  TROPONINI 0.13* 0.42* 0.40*   No results for input(s): PROBNP in the last 168 hours.  CHEMISTRY  Recent Labs Lab 05/22/15 0500 05/23/15 [id number]  05/24/15 0345 05/25/15 0430 05/26/15 0600  NA 145 148* 146* 148* 150*  K 3.5 3.5 3.6 3.6 3.7  CL 116* 115* 118* 116* 119*  CO2 23 24 23 24 22   GLUCOSE 156* 161* 122* 115* 103*  BUN 48* 49* 53* 50* 52*  CREATININE 1.65* 1.56* 1.49* 1.51* 1.50*  CALCIUM 8.9 9.1 9.1 9.3 9.3  MG 1.8 2.0 2.2 2.2 2.0  PHOS 4.0 3.6 3.4 3.3 3.3   Estimated Creatinine Clearance: 32.6 mL/min (by C-G formula based on Cr of 1.5).  LIVER  Recent Labs Lab 05/19/15 1741  05/20/15 0228 05/20/15 1905 05/22/15 0500 05/24/15 0345 05/25/15 0430 05/26/15 0600  AST 78* 78*  --  59*  --   --   --   ALT 28 27  --  25  --   --   --   ALKPHOS 202* 185*  --  168*  --   --   --   BILITOT 1.7* 1.7*  --  1.6*  --   --   --   PROT 6.2* 5.7* 6.0* 5.6*  --   --   --   ALBUMIN 1.8* 1.6*  --  1.5* 1.4* 1.4* 1.3*  INR  --  1.17  --   --   --   --   --    INFECTIOUS  Recent Labs Lab 05/19/15 2050 05/20/15 0228 05/23/15 0413  LATICACIDVEN 1.90 1.8  1.7 1.4  PROCALCITON  --  0.31  --    ENDOCRINE CBG (last 3)   Recent Labs  05/25/15 1927 05/25/15 2333 05/26/15 0400  GLUCAP 110* 112* 101*   IMAGING x48h Dg Chest Port 1 View  05/25/2015  CLINICAL DATA:  Acute respiratory failure, CHF, healthcare associated pneumonia, sepsis EXAM: PORTABLE CHEST 1 VIEW COMPARISON:  Portable chest x-ray of May 23, 2015 FINDINGS: The lungs are slightly better inflated today. There are bilateral pleural effusions and bibasilar atelectasis or pneumonia. The endotracheal tube tip lies 2.5 cm above the carina. The heart is normal in size. The central pulmonary vascularity is mildly prominent and slightly more conspicuous overall today. There is no pneumothorax. The esophagogastric tube tip projects below the inferior margin of the image. The right sided chest tube tip projects over the medial costophrenic angle and is stable in position. The right internal jugular venous catheter tip projects over the midportion of the SVC. There are degenerative changes of both shoulders. IMPRESSION: Improved aeration of both lungs. Persistent bibasilar atelectasis/ pneumonia with small pleural effusions. Mild central pulmonary vascular congestion is more conspicuous today. The support tubes are in stable position. Electronically Signed   By: David  Martinique M.D.   On: 05/25/2015 07:11   I reviewed CXR myself, CT in place.  ASSESSMENT / PLAN:  PULMONARY A: Acute Hypoxic Respiratory Failure - Secondary  to HCAP. HCAP Right Pleural Effusion - S/P Chest Tube 3/15.  Tobacco Use  P:   Titrate O2 for sat of 88-92%. DNR. Ambulate. Daily WUA & SBT Chest tube to water seal, output is very high 100 ml/hr, serous, keep at water seal. Swallow evaluation.  CARDIOVASCULAR A:  Hypotension - Resolved. Likely secondary to sedation & positive pressure. Chronic CHF H/O HTN & Dyslipidemia  P:  ASA 81mg  daily D/C levophed. Hold outpt coreg, mevacor, patient is refusing POs.  RENAL A:   Hypernatremia - Mild. CKD Stage II Lactic acidosis - Resolved.  P:   Trending UOP Monitoring renal function & electrolytes daily Replace electrolytes as indicated  GASTROINTESTINAL  A:   Constipation H/O Liver Cirrhosis  P:   Protonix VT daily Change lactulose to PO. Dysphagia 3 diet per speech.  HEMATOLOGIC A:   Anemia - No signs of active bleeding. Likely due to chronic disease.  P:  Trending cell counts daily w/ CBC. D/C Lovenox. Heparin [place] q8hr. SCDs.  INFECTIOUS A:   HCAP  Parapneumonic Right Effusion Enterococcus Bacteremia - TTE negative.  P:   Day#7 Vancomycin & Cefepime, finish an 8 day course. Follow cultures to completion.  ENDOCRINE A:   H/O DM - BG controlled.  P:   D/Ced Levemir until able to take POs regularly. Accu-Checks q4hr. SSI per algorithm.  NEUROLOGIC A:   Acute Encephalopathy - Hepatic versus Toxic Metabolic  P:   D/C versed, fentanyl. Hold Bupropion & Neurontin PO lactulose to continue.  Family Discussion:  Discussed code status with family, full DNR, transfer to 2W and care to [facility] with PCCM following for CT management.  Discussed with TRH-MD.  Rush Farmer, M.D. [facility] Pulmonary/Critical Care Medicine. Pager: [phone]. After hours pager: [phone].

## 2015-05-26 NOTE — Progress Notes (Signed)
Pharmacy Antibiotic Note  Tamara Stephenson is a 82 y.o. female admitted on 05/19/2015 with pneumonia and sepsis.  Pharmacy has been consulted for vancomycin and cefepime dosing.  Hypothermic 96.7 overnight, wbc normal at 6.9, SCr stable at 1.5.  ID was autoconsulted d/t positive bld cx, question clinical significance of 1/2 positive bld cx for enterococcus and unusual presentation for organism.   Today is day #7 of antibiotics would consider stopping soon with no active signs of infection.  Plan: This patient's current antibiotics will be continued without adjustments.  Will continue to follow culture results Will plan on vancomycin level tomorrow if to continue  Height: 5\' 6"  (167.6 cm) Weight: 177 lb 7.5 oz (80.5 kg) IBW/kg (Calculated) : 59.3  Temp (24hrs), Avg:97.5 F (36.4 C), Min:96.7 F (35.9 C), Max:98 F (36.7 C)   Recent Labs Lab 05/19/15 1750 05/19/15 2050 05/20/15 0228  05/21/15 0350 05/22/15 0500 05/23/15 0412 05/23/15 0413 05/23/15 1922 05/24/15 0345 05/25/15 0430 05/26/15 0600  WBC  --   --  8.0  < > 12.5* 6.8  --   --   --  8.0 6.9 6.9  CREATININE  --   --  1.55*  --  1.63* 1.65* 1.56*  --   --  1.49* 1.51* 1.50*  LATICACIDVEN 2.42* 1.90 1.8  1.7  --   --   --   --  1.4  --   --   --   --   VANCOTROUGH  --   --   --   --   --   --   --   --  26*  --   --   --   < > = values in this interval not displayed.  Estimated Creatinine Clearance: 32.6 mL/min (by C-G formula based on Cr of 1.5).    Allergies  Allergen Reactions  . Peanut-Containing Drug Products Nausea And Vomiting    Antimicrobials this admission: 3/14 Vancomycin >> 3/14 Cefepime >>   Dose adjustments this admission: N/a  Microbiology results: 3/14 Urine >>ngF 3/14 Blood >> 1/2 enterococcus 3/15 Rt pleural fluid >> ngF 3/15 Sputum >>ngF 3/15 Pneumococcal Ag >> negative 3/15 Legionella Ag >> neg 3/15 MRSA PCR: neg  Thank you for allowing pharmacy to be a part of this patient's  care.  Erin Hearing PharmD., BCPS Clinical Pharmacist Pager [phone] 05/26/2015 8:52 AM

## 2015-05-26 NOTE — Progress Notes (Signed)
[facility] Progress Note Patient Name: Tamara Stephenson DOB: 1933-11-16 MRN: [id number]   Date of Service  05/26/2015  HPI/Events of Note  Camera check on patient postextubation. Sleeping comfortably in bed. Saturating 96% on room air. No distress.  eICU Interventions  Continue close monitoring in the intensive care unit given recent extubation & multiple medical problems.     Intervention Category Major Interventions: Respiratory failure - evaluation and management  Tera Partridge 05/26/2015, 3:06 AM

## 2015-05-26 NOTE — Evaluation (Signed)
Physical Therapy Evaluation Patient Details Name: Tamara Stephenson MRN: [id number] DOB: 03-20-1933 Today's Date: 05/26/2015   History of Present Illness  82 y.o. female with h/o DM type 2, cirrhosis of the liver, CHF, chronic kidney disease, HTN, carotid stenosis and chronic anemia, who presented to ED after being found on floor at ALF for unknown period of time. Pt was recently d/c from hospital 2 weeks ago after tx for PNA, lower extremity cellulitis and decubitus ulcers. Intubated for period of 5 days (3/15-3/20). CT Head 3/14 no acute intracranial abnormality seen.  Clinical Impression  Patient presents with decreased mobility due to deficits listed in PT problem list.  She will benefit from skilled PT in the acute setting to allow return home to ALF following SNF level rehab stay.  Patient with limited tolerance to upright this session, but eager initially to attempt OOB.    Follow Up Recommendations SNF    Equipment Recommendations  None recommended by PT    Recommendations for Other Services       Precautions / Restrictions Precautions Precautions: Fall Precaution Comments: reports multiple falls at facility      Mobility  Bed Mobility Overal bed mobility: Needs Assistance Bed Mobility: Supine to Sit;Sit to Supine     Supine to sit: +2 for physical assistance;Max assist;HOB elevated Sit to supine: +2 for physical assistance;Total assist   General bed mobility comments: assisted legs off bed and second person assisted with shoulders; returned to supine same with assist for both  Transfers                 General transfer comment: NT, due to pain in LE's and L foot with bloody drainage despite just having dressings changed  Ambulation/Gait                Stairs            Wheelchair Mobility    Modified Rankin (Stroke Patients Only)       Balance Overall balance assessment: Needs assistance Sitting-balance support: Bilateral upper extremity  supported;Feet unsupported Sitting balance-Leahy Scale: Poor Sitting balance - Comments: min A provided with pt generally weak and needs UE support as well for balance                                     Pertinent Vitals/Pain Pain Assessment: Faces Faces Pain Scale: Hurts even more Pain Location: legs tender to touch Pain Descriptors / Indicators: Grimacing;Guarding Pain Intervention(s): Limited activity within patient's tolerance;Monitored during session    Home Living Family/patient expects to be discharged to:: Assisted living             Home Layout: One level Home Equipment: Wheelchair - [employer] - 2 wheels Additional Comments: from [facility]    Prior Function Level of Independence: Needs assistance   Gait / Transfers Assistance Needed: patient poor historian, reports she was using walker, but had multiple falls; last admission note states she was non-ambulatory with fear of falling     Comments: Uses RW and WC     Hand Dominance   Dominant Hand: Right    Extremity/Trunk Assessment   Upper Extremity Assessment: RUE deficits/detail RUE Deficits / Details: painful with AAROM up to 80 degrees shoulder flexion with palpable/audible crepitus         Lower Extremity Assessment: RLE deficits/detail;LLE deficits/detail RLE Deficits / Details: lifts antigravity in bed, but very limited and  difficulty with knee flexion sitting EOB edema noted in knees; ankle DF limited compared to L with weakness  LLE Deficits / Details: lifts antigravity in bed, but very limited and difficulty with knee flexion sitting EOB edema noted in knees     Communication   Communication: No difficulties  Cognition Arousal/Alertness: Awake/alert Behavior During Therapy: WFL for tasks assessed/performed Overall Cognitive Status: No family/caregiver present to determine baseline cognitive functioning                 General Comments: chart states h/o dementia;  pt does not recall events leading up to admission    General Comments      Exercises General Exercises - Upper Extremity Shoulder Flexion: AAROM;Right;5 reps;Supine General Exercises - Lower Extremity Ankle Circles/Pumps: AROM;AAROM;Both;5 reps;Supine      Assessment/Plan    PT Assessment Patient needs continued PT services  PT Diagnosis Generalized weakness   PT Problem List Decreased strength;Decreased activity tolerance;Decreased balance;Decreased mobility;Decreased knowledge of use of DME;Decreased skin integrity  PT Treatment Interventions     PT Goals (Current goals can be found in the Care Plan section) Acute Rehab PT Goals Patient Stated Goal: to get OOB PT Goal Formulation: With patient Time For Goal Achievement: 06/02/15 Potential to Achieve Goals: Fair    Frequency Min 3X/week   Barriers to discharge        Co-evaluation               End of Session   Activity Tolerance: Treatment limited secondary to medical complications (Comment) (bloody drainage from L foot) Patient left: in bed;with call bell/phone within reach Nurse Communication: Other (comment) (L foot with bloody drainage with dependency)         Time: [id number] PT Time Calculation (min) (ACUTE ONLY): 23 min   Charges:   PT Evaluation $PT Eval High Complexity: 1 Procedure     PT G CodesReginia Stephenson 06-19-2015, 10:30 AM  Tamara Stephenson, Tamara Stephenson 19-Jun-2015

## 2015-05-27 DIAGNOSIS — I5042 Chronic combined systolic (congestive) and diastolic (congestive) heart failure: Secondary | ICD-10-CM

## 2015-05-27 DIAGNOSIS — D638 Anemia in other chronic diseases classified elsewhere: Secondary | ICD-10-CM

## 2015-05-27 DIAGNOSIS — N183 Chronic kidney disease, stage 3 (moderate): Secondary | ICD-10-CM

## 2015-05-27 LAB — GLUCOSE, CAPILLARY
GLUCOSE-CAPILLARY: 154 mg/dL — AB (ref 65–99)
GLUCOSE-CAPILLARY: 240 mg/dL — AB (ref 65–99)
GLUCOSE-CAPILLARY: 187 mg/dL — AB (ref 65–99)
Glucose-Capillary: 200 mg/dL — ABNORMAL HIGH (ref 65–99)

## 2015-05-27 LAB — CBC WITH DIFFERENTIAL/PLATELET
Basophils Absolute: 0 10*3/uL (ref 0.0–0.1)
Basophils Relative: 0 %
EOS ABS: 0.3 10*3/uL (ref 0.0–0.7)
Eosinophils Relative: 4 %
HCT: 25.9 % — ABNORMAL LOW (ref 36.0–46.0)
HEMOGLOBIN: 8.5 g/dL — AB (ref 12.0–15.0)
LYMPHS ABS: 2.8 10*3/uL (ref 0.7–4.0)
Lymphocytes Relative: 35 %
MCH: 33.2 pg (ref 26.0–34.0)
MCHC: 32.8 g/dL (ref 30.0–36.0)
MCV: 101.2 fL — ABNORMAL HIGH (ref 78.0–100.0)
Monocytes Absolute: 1.1 10*3/uL — ABNORMAL HIGH (ref 0.1–1.0)
Monocytes Relative: 14 %
NEUTROS ABS: 3.6 10*3/uL (ref 1.7–7.7)
NEUTROS PCT: 47 %
Platelets: 176 10*3/uL (ref 150–400)
RBC: 2.56 MIL/uL — AB (ref 3.87–5.11)
RDW: 19.9 % — ABNORMAL HIGH (ref 11.5–15.5)
WBC: 7.9 10*3/uL (ref 4.0–10.5)

## 2015-05-27 LAB — RENAL FUNCTION PANEL
ALBUMIN: 1.3 g/dL — AB (ref 3.5–5.0)
ANION GAP: 6 (ref 5–15)
BUN: 53 mg/dL — ABNORMAL HIGH (ref 6–20)
CALCIUM: 9 mg/dL (ref 8.9–10.3)
CO2: 23 mmol/L (ref 22–32)
Chloride: 118 mmol/L — ABNORMAL HIGH (ref 101–111)
Creatinine, Ser: 1.55 mg/dL — ABNORMAL HIGH (ref 0.44–1.00)
GFR, EST AFRICAN AMERICAN: 36 mL/min — AB (ref >60)
GFR, EST NON AFRICAN AMERICAN: 31 mL/min — AB (ref >60)
Glucose, Bld: 176 mg/dL — ABNORMAL HIGH (ref 65–99)
PHOSPHORUS: 2.3 mg/dL — AB (ref 2.5–4.6)
Potassium: 3.7 mmol/L (ref 3.5–5.1)
SODIUM: 147 mmol/L — AB (ref 135–145)

## 2015-05-27 LAB — MAGNESIUM: MAGNESIUM: 2 mg/dL (ref 1.7–2.4)

## 2015-05-27 NOTE — Progress Notes (Signed)
PROGRESS NOTE  Tamara Stephenson [id number] DOB: November 07, 1933 DOA: 05/19/2015 PCP: Curly Rim, MD Outpatient Specialists:    LOS: 8 days   Brief Narrative: 82 yo F with history of DM, liver cirrhosis, CHF, CKD, admitted on 3/15 with hypothermia, elevated lactic acid and acute encephalopathy, felt to be septic.   Assessment & Plan: Principal Problem:   Sepsis ([place]) Active Problems:   CKD (chronic kidney disease) stage 3, GFR 30-59 ml/min   Anemia of chronic disease   Diabetes mellitus with renal manifestations, uncontrolled (HCC)   Cirrhosis of liver with ascites (HCC)   HCAP (healthcare-associated pneumonia)   Dehydration   CHF (congestive heart failure) (HCC)   Pressure ulcer   Acute encephalopathy   Pleural effusion   Acute respiratory failure with hypoxia ([place])   Demand ischemia (HCC)   Encounter for central line placement   Respiratory failure ([place])   Chest tube in place   Pleural effusion, right   Palliative care encounter   Goals of care, counseling/discussion   Acute hypoxic respiratory failure due to HCAP and right sided pleural effusion - right pleural effusion s/p chest tubs placement 3/15. PCCM managing - was on vent until 3/20, extubated, comfortable on room air today  - improving, now on room air - today day 8 of HCAP coverage  Liver cirrhosis - decompensated with hepatic encephalopathy, ascites - continue Lactulose - continue to hold Lasix as she appears intravascularly depleted with hypernatremia  DM - last A1C 6.6 in 12/2014 - continue SSI  Hypernatremia - improving, encouraged po intake - monitor  Anemia of critical illness - stable, no bleeding  Acute encephalopathy - improving, however slightly encephalopathic today, did not know she has a chest tube in place  HTN - hold home antihypertensives  AKI on CKD III-IV - renal function stable, monitor  Hypotension - thought to be due to sedation  Positive blood culture with enterococcus  1/2 bottles - ID consulted, recommended against treatment as it was only 1/2 bottles and significance is unclear.   Chronic combined CHF - repeat echo 05/2015 with recovery of her EF, now 65-70%, grade 1 dd   DVT prophylaxis: heparin Code Status: DNR Family Communication: no family bedside Disposition Plan: TBD Barriers for discharge: chest tube  Consultants:   PCCM  ID  STUDIES: TTE 3/17: Mild LVH. EF 65-70%. No wall motion abnormality. Grade 1 diastolic dysfunction. LA mildly dilated & RA normal in size. RV normal in size & function. No aortic stenosis or regurg. Trivial mitral regurg w/o stenosis.No pulmonic stenosis. Trivial tricuspid regurg. No pericardial effusion.  Port CXR 3/18: Patchy bilateral opacities worse in lower lung zones. ETT & chest tube in good position. R IJ in good position. MICROBIOLOGY: Right Pleural Fluid AFB Ctx 3/15>>> Right Pleural Fluid Fungal Ctx 3/15>>> Right Pleural Fluid Ctx 3/15>>> negative  Tracheal Asp Ctx 3/15>>> negative MRSA PCR 3/15: Negative Urine Strep Ag 3/15: Negative Urine Legionella Ag 3/15: Negative  Urine Ctx 3/14: Negative  Blood Ctx x2 3/14: Enterococcus 1/2 ANTIBIOTICS: Vancomycin 3/14 >> Cefepime 3/14 >>  LINES/TUBES:  OETT 7.5 3/15>>  R IJ CVL 3/15 >>  R chest tube 35fr 3/15 >>  Foley 3/15>>  OGT 3/15>>  SIGNIFICANT EVENTS:  3/14 Admit  3/14 CT head >> diffuse atrophy, chronic white matter ischemic changes  3/14 CT chest >> Rt pleural effusion with compressive ATX of Rt lung  3/14 EEG >> diffuse slowing  3/15 Rt arm doppler >> no DVT  3/15 Palliative care consult >>  full code  3/20 extubate 3/22 TRH took over  Antimicrobials:  Vancomycin 3/15 >>  Cefepime 3/15 >>   Subjective: - no complaints, denies dyspnea, no chest pain. Appears mildly confused.   Objective: Filed Vitals:   05/26/15 1755 05/26/15 1924 05/27/15 0134 05/27/15 0436  BP: 117/70 121/58 120/47 122/39  Pulse: 86 92  86  Temp:  98.4 F (36.9 C) 97.6 F (36.4 C)  98.1 F (36.7 C)  TempSrc: Oral Oral  Oral  Resp: 16 16  16   Height:      Weight:    81 kg (178 lb 9.2 oz)  SpO2: 98% 96%  96%    Intake/Output Summary (Last 24 hours) at 05/27/15 1120 Last data filed at 05/27/15 0648  Gross per 24 hour  Intake    720 ml  Output   3150 ml  Net  -2430 ml   Filed Weights   05/25/15 0447 05/26/15 0407 05/27/15 0436  Weight: 85.3 kg (188 lb 0.8 oz) 80.5 kg (177 lb 7.5 oz) 81 kg (178 lb 9.2 oz)    Examination: BP 122/39 mmHg  Pulse 86  Temp(Src) 98.1 F (36.7 C) (Oral)  Resp 16  Ht 5\' 6"  (1.676 m)  Wt 81 kg (178 lb 9.2 oz)  BMI 28.84 kg/m2  SpO2 96%  GENERAL: NAD  HEENT: head NCAT, no scleral icterus. Pupils round and reactive.   NECK: Supple. No carotid bruits. No lymphadenopathy or thyromegaly.  LUNGS: Clear to auscultation. No wheezing or crackles. Chest tube in place  HEART: Regular rate and rhythm without murmur. 2+ pulses, no JVD, no peripheral edema  ABDOMEN: Soft, nontender, and nondistended. Positive bowel sounds.   EXTREMITIES: Without any cyanosis or clubbing  NEUROLOGIC: non focal    Data Reviewed: I have personally reviewed following labs and imaging studies  CBC:  Recent Labs Lab 05/22/15 0500 05/24/15 0345 05/25/15 0430 05/26/15 0600 05/27/15 0500  WBC 6.8 8.0 6.9 6.9 7.9  NEUTROABS  --  4.1 3.3 3.2 3.6  HGB 8.9* 8.4* 8.6* 8.4* 8.5*  HCT 27.0* 25.4* 27.1* 26.8* 25.9*  MCV 98.9 99.6 100.4* 100.8* 101.2*  PLT 215 175 173 170 0000000   Basic Metabolic Panel:  Recent Labs Lab 05/23/15 0412 05/24/15 0345 05/25/15 0430 05/26/15 0600 05/27/15 0500  NA 148* 146* 148* 150* 147*  K 3.5 3.6 3.6 3.7 3.7  CL 115* 118* 116* 119* 118*  CO2 24 23 24 22 23   GLUCOSE 161* 122* 115* 103* 176*  BUN 49* 53* 50* 52* 53*  CREATININE 1.56* 1.49* 1.51* 1.50* 1.55*  CALCIUM 9.1 9.1 9.3 9.3 9.0  MG 2.0 2.2 2.2 2.0 2.0  PHOS 3.6 3.4 3.3 3.3 2.3*   GFR: Estimated Creatinine  Clearance: 31.6 mL/min (by C-G formula based on Cr of 1.55). Liver Function Tests:  Recent Labs Lab 05/20/15 1905 05/22/15 0500 05/24/15 0345 05/25/15 0430 05/26/15 0600 05/27/15 0500  AST  --  59*  --   --   --   --   ALT  --  25  --   --   --   --   ALKPHOS  --  168*  --   --   --   --   BILITOT  --  1.6*  --   --   --   --   PROT 6.0* 5.6*  --   --   --   --   ALBUMIN  --  1.5* 1.4* 1.4* 1.3* 1.3*   No results for  input(s): LIPASE, AMYLASE in the last 168 hours.  Recent Labs Lab 05/21/15 0500 05/24/15 1145  AMMONIA 50* 61*   Cardiac Enzymes:  Recent Labs Lab 05/20/15 1133 05/20/15 2006  TROPONINI 0.42* 0.40*   CBG:  Recent Labs Lab 05/26/15 0838 05/26/15 1301 05/26/15 1710 05/26/15 1943 05/27/15 0641  GLUCAP 87 172* 180* 133* 154*   Urine analysis:    Component Value Date/Time   COLORURINE YELLOW 05/19/2015 2022   APPEARANCEUR CLEAR 05/19/2015 2022   LABSPEC 1.015 05/19/2015 2022   PHURINE 5.5 05/19/2015 2022   GLUCOSEU NEGATIVE 05/19/2015 2022   HGBUR NEGATIVE 05/19/2015 2022   BILIRUBINUR NEGATIVE 05/19/2015 2022   KETONESUR NEGATIVE 05/19/2015 2022   PROTEINUR NEGATIVE 05/19/2015 2022   UROBILINOGEN 1.0 01/06/2015 1225   NITRITE NEGATIVE 05/19/2015 2022   LEUKOCYTESUR NEGATIVE 05/19/2015 2022   Sepsis Labs: Invalid input(s): PROCALCITONIN, LACTICIDVEN  Recent Results (from the past 240 hour(s))  Blood Culture (routine x 2)     Status: None   Collection Time: 05/19/15  5:34 PM  Result Value Ref Range Status   Specimen Description BLOOD RIGHT EJ  Final   Special Requests BOTTLES DRAWN AEROBIC ONLY [place]  Final   Culture  Setup Time   Final    GRAM POSITIVE COCCI IN PAIRS IN CLUSTERS AEROBIC BOTTLE ONLY CRITICAL RESULT CALLED TO, READ BACK BY AND VERIFIED WITH: A. AGUIRRE,RN AT [id number] ON [id number] BY Rhea Bleacher    Culture   Final    ENTEROCOCCUS SPECIES STAPHYLOCOCCUS SPECIES (COAGULASE NEGATIVE) THE SIGNIFICANCE OF ISOLATING THIS ORGANISM  FROM A SINGLE SET OF BLOOD CULTURES WHEN MULTIPLE SETS ARE DRAWN IS UNCERTAIN. PLEASE NOTIFY THE MICROBIOLOGY DEPARTMENT WITHIN ONE WEEK IF SPECIATION AND SENSITIVITIES ARE REQUIRED.    Report Status 05/22/2015 FINAL  Final   Organism ID, Bacteria ENTEROCOCCUS SPECIES  Final      Susceptibility   Enterococcus species - MIC*    AMPICILLIN <=2 SENSITIVE Sensitive     VANCOMYCIN 1 SENSITIVE Sensitive     GENTAMICIN SYNERGY SENSITIVE Sensitive     * ENTEROCOCCUS SPECIES  Urine culture     Status: None   Collection Time: 05/19/15  8:22 PM  Result Value Ref Range Status   Specimen Description URINE, CATHETERIZED  Final   Special Requests NONE  Final   Culture NO GROWTH 2 DAYS  Final   Report Status 05/21/2015 FINAL  Final  Blood Culture (routine x 2)     Status: None   Collection Time: 05/19/15  8:45 PM  Result Value Ref Range Status   Specimen Description BLOOD LEFT ANTECUBITAL  Final   Special Requests BOTTLES DRAWN AEROBIC AND ANAEROBIC 5CC  Final   Culture NO GROWTH 5 DAYS  Final   Report Status 05/24/2015 FINAL  Final  MRSA PCR Screening     Status: None   Collection Time: 05/20/15  3:42 PM  Result Value Ref Range Status   MRSA by PCR NEGATIVE NEGATIVE Final    Comment:        The GeneXpert MRSA Assay (FDA approved for NASAL specimens only), is one component of a comprehensive MRSA colonization surveillance program. It is not intended to diagnose MRSA infection nor to guide or monitor treatment for MRSA infections.   Culture, respiratory (tracheal aspirate)     Status: None   Collection Time: 05/20/15  6:11 PM  Result Value Ref Range Status   Specimen Description TRACHEAL ASPIRATE  Final   Special Requests Normal  Final   Gram Stain  Final    MODERATE WBC PRESENT, PREDOMINANTLY PMN NO SQUAMOUS EPITHELIAL CELLS SEEN NO ORGANISMS SEEN Performed at [facility]    Culture   Final    NO GROWTH 2 DAYS Performed at [facility]    Report Status  05/23/2015 FINAL  Final  Acid Fast Smear (AFB)     Status: None   Collection Time: 05/21/15  8:30 AM  Result Value Ref Range Status   AFB Specimen Processing Concentration  Final   Acid Fast Smear Negative  Final    Comment: (NOTE) Performed At: [facility] [address] [place], [place] [id number] Lindon Romp MD [id number]    Source (AFB) FLUID  Final    Comment: PLEURAL  Fungus Culture With Stain (Not @ [facility])     Status: None (Preliminary result)   Collection Time: 05/21/15  8:30 AM  Result Value Ref Range Status   Fungus Stain Final report  Final    Comment: (NOTE) Performed At: [facility] [place], [place] [id number] Lindon Romp MD [id number]    Fungus (Mycology) Culture PENDING  Incomplete   Fungal Source FLUID  Final    Comment: PLEURAL  Body fluid culture     Status: None   Collection Time: 05/21/15  8:30 AM  Result Value Ref Range Status   Specimen Description FLUID PLEURAL  Final   Special Requests NONE  Final   Gram Stain   Final    MODERATE WBC PRESENT,BOTH PMN AND MONONUCLEAR NO ORGANISMS SEEN    Culture NO GROWTH 3 DAYS  Final   Report Status 05/24/2015 FINAL  Final  Fungus Culture Result     Status: None   Collection Time: 05/21/15  8:30 AM  Result Value Ref Range Status   Result 1 Comment  Final    Comment: (NOTE) KOH/Calcofluor preparation:  no fungus observed. Performed At: [facility] [address] [place], [place] [id number] Lindon Romp MD [id number]       Radiology Studies: No results found.   Scheduled Meds: . antiseptic oral rinse  7 mL Mouth Rinse q12n4p  . aspirin  81 mg Per Tube Daily  . ceFEPime (MAXIPIME) IV  1 g Intravenous Q24H  . heparin subcutaneous  5,000 Units Subcutaneous 3 times per day  . insulin aspart  0-15 Units Subcutaneous TID WC  . lactulose  20 g Oral TID  . pantoprazole sodium  40 mg Per Tube Q24H  . sodium chloride flush  10-40 mL Intracatheter  Q12H   Continuous Infusions: . sodium chloride 10 mL/hr at 05/21/15 1000    Marzetta Board, MD, PhD [facility] Pager [phone]  If 7PM-7AM, please contact night-coverage [email or web address] Password [facility] 05/27/2015, 11:20 AM

## 2015-05-27 NOTE — Progress Notes (Signed)
Speech Language Pathology Dysphagia Treatment Patient Details Name: Tamara Stephenson MRN: [id number] DOB: 01/23/1934 Today's Date: 05/27/2015 Time: [id number] SLP Time Calculation (min) (ACUTE ONLY): 29 min  Assessment / Plan / Recommendation Clinical Impression    Pt exhibited immediate and delayed coughing during intake of thin liquids, indicative of possible airway compromise. Pt required max verbal, tactile and visual cues by SLP to take small sips/bites during intake, as large sips due to impulsivity might have caused these coughing episodes. No overt s/s of penetration/aspiration witnessed during intake of mechanical soft foods and nectar thick liquids. Pt educated re: diet recommendation of Dysphagia 3 (mechanical soft) diet and nectar thick liquids (straws allowed), with meds whole in puree. Pt requires FULL supervision during PO intake due to impulsivity.    Diet Recommendation    Dysphagia 3 (mechanical soft) diet, nectar thick liquids, meds whole in puree   SLP Plan Continue with current plan of care      Swallowing Goals     General Behavior/Cognition: Alert;Cooperative;Pleasant mood;Impulsive Patient Positioning: Upright in bed Oral care provided: N/A HPI: 82 y.o. female with h/o DM type 2, cirrhosis of the liver, CHF, chronic kidney disease, HTN, carotid stenosis and chronic anemia, who presented to ED after being found on floor at ALF for unknown period of time. Pt was recently d/c from hospital 2 weeks ago after tx for PNA, lower extremity cellulitis and decubitus ulcers. Intubated for period of 5 days (3/15-3/20). CT Head 3/14 no acute intracranial abnormality seen. CXR 3/20 persistent bibasilar atelectasis/pneumonia with small pleural effusions. No prior h/o SLP intervention found in chart.  Oral Cavity - Oral Hygiene     Dysphagia Treatment Treatment Methods: Skilled observation;Compensation strategy training;Patient/caregiver education Patient observed directly with PO's:  Yes Type of PO's observed: Dysphagia 1 (puree);Dysphagia 3 (soft);Nectar-thick liquids;Thin liquids Feeding: Able to feed self;Needs assist;Needs set up Liquids provided via: Cup;Straw Oral Phase Signs & Symptoms:  (none) Pharyngeal Phase Signs & Symptoms: Delayed cough;Immediate cough;Multiple swallows Type of cueing: Verbal;Tactile;Visual Amount of cueing: Maximal   GO     Tamara Stephenson 05/27/2015, 10:03 AM   Titus Mould, Student-SLP

## 2015-05-27 NOTE — Progress Notes (Signed)
0128 Patient had short burst svt returning to sinus rhythm on monitor.Patient lying awake in bed asymptomatic blood pressure 120/47.Will continue to monitor patient.

## 2015-05-27 NOTE — Progress Notes (Signed)
Nutrition Follow-up  DOCUMENTATION CODES:   Non-severe (moderate) malnutrition in context of chronic illness  INTERVENTION:   - Continue providing Magic Cup supplement on all meal trays.  NEW NUTRITION DIAGNOSIS:   Malnutrition related to chronic illness as evidenced by mild depletion of body fat, moderate depletions of muscle mass, percent weight loss.  Ongoing  GOAL:   Patient will meet greater than or equal to 90% of their needs  Progressing  MONITOR:   PO intake, Labs, Weight trends, Skin, I & O's, Supplement acceptance  ASSESSMENT:   Tamara Stephenson is a 82 y.o. female with history of diabetes mellitus type 2, cirrhosis of the liver, CHF, chronic kidney disease and chronic anemia who was recently discharged from the hospital 2 weeks ago after being treated for pneumonia and lower extremity cellulitis and decubitus ulcers was found on the floor at the assisted living facility.  Patient extubated and tube feeds d/c on 3/20.  SLP eval on 3/21, recommends dysphagia 3 diet and nectar thick liquids  Spoke to patient who reports a fair appetite.  States that she does not think the food tastes very good but has been eating about half of her meals, 25-75% per meal completion in chart.  Patient consumed about half her eggs and all of a magic cup for breakfast.  States that she is really thirsty, consumed 2 thickened orange juice cups and 1 thickened milk for breakfast.  Denies any swallowing difficulties or nausea.  Reports that she was consuming 3 meals per day PTA.   Patient reports that she has lost about 25#, but cannot recall time frame.  Per chart review, patient has lost 27# / 13% weight loss x 1 month, significant for the time frame.  Nutrition Focused Physical Exam was completed.  Patient with mild to moderate fat and muscle depletion.     Medications reviewed: novolog. Labs reviewed: elevated CBGs (133-180), elevated sodium (147), low phosphorus (2.3).  Diet Order:  DIET DYS 3  Room service appropriate?: Yes; Fluid consistency:: Nectar Thick  Skin:  Wound (see comment) (Stage II PU on sacrum and hips. Unstageable PU on heel )  Last BM:  3/20  Height:   Ht Readings from Last 1 Encounters:  05/20/15 5\' 6"  (1.676 m)    Weight:   Wt Readings from Last 1 Encounters:  05/27/15 178 lb 9.2 oz (81 kg)    Ideal Body Weight:  59.09 kg  BMI:  Body mass index is 28.84 kg/(m^2).  Estimated Nutritional Needs:   Kcal:  1600-1800  Protein:  90-100 grams  Fluid:  1.6 - 1.8 L  EDUCATION NEEDS:   Education needs addressed  Veronda Prude, Dietetic Intern Pager: [phone]

## 2015-05-27 NOTE — NC FL2 (Signed)
MEDICAID FL2 LEVEL OF CARE SCREENING TOOL     IDENTIFICATION  Patient Name: Tamara Stephenson Birthdate: Dec 05, 1933 Sex: female Admission Date (Current Location): 05/19/2015  [facility] and [place] Number:  Herbalist and Address:  [facility], [place] [address] [place],  [address]      Provider Number: [id number]  Attending Physician Name and Address:  Caren Griffins, MD  Relative Name and Phone Number:  Roderick Pee, friend, [phone]    Current Level of Care: Hospital Recommended Level of Care: [place] Prior Approval Number:    Date Approved/Denied:   PASRR Number: [id number]  Discharge Plan: SNF    Current Diagnoses: Patient Active Problem List   Diagnosis Date Noted  . Acute encephalopathy 05/20/2015  . Pleural effusion 05/20/2015  . Respiratory failure ([place]) 05/20/2015  . Acute respiratory failure with hypoxia ([place])   . Demand ischemia ([place])   . Encounter for central line placement   . Chest tube in place   . Pleural effusion, right   . Palliative care encounter   . Goals of care, counseling/discussion   . Sepsis ([place]) 05/19/2015  . Pressure ulcer 05/03/2015  . HCAP (healthcare-associated pneumonia) 05/02/2015  . Dehydration 05/02/2015  . CHF (congestive heart failure) ([place]) 05/02/2015  . Cellulitis of both lower extremities 05/02/2015  . Depression 05/02/2015  . Displaced fracture of right femoral neck ([place]) 01/08/2015  . Preoperative cardiovascular examination 01/07/2015  . Cardiomyopathy ([place]) 01/07/2015  . Hip fracture ([place]) 01/06/2015  . Cirrhosis of liver with ascites ([place]) 12/26/2014  . CKD (chronic kidney disease) stage 3, GFR 30-59 ml/min 09/16/2014  . Anemia of chronic disease 09/16/2014  . Diabetes mellitus with renal manifestations, uncontrolled ([place]) 09/16/2014  . Obesity, Class I, BMI 30-34.9 09/16/2014  . Diabetic nephropathy ([place]) 09/16/2014    Orientation RESPIRATION BLADDER  Height & Weight     Self, Place  Normal Continent, Indwelling catheter (Urinary catheter) Weight: 81 kg (178 lb 9.2 oz) Height:  5\' 6"  (167.6 cm)  BEHAVIORAL SYMPTOMS/MOOD NEUROLOGICAL BOWEL NUTRITION STATUS      Incontinent  (Please see DC summary)  AMBULATORY STATUS COMMUNICATION OF NEEDS Skin   Extensive Assist Verbally PU Stage and Appropriate Care (Stage II PU on sacrum and hip; Unstageable on heel;Closed incision on chest)                       Personal Care Assistance Level of Assistance  Bathing, Feeding, Dressing Bathing Assistance: Maximum assistance Feeding assistance: Limited assistance Dressing Assistance: Limited assistance     Functional Limitations Info             SPECIAL CARE FACTORS FREQUENCY  PT (By licensed PT)     PT Frequency: min 3x/week              Contractures      Additional Factors Info  Code Status, Allergies, Isolation Precautions, Insulin Sliding Scale Code Status Info: DNR Allergies Info: Peanut-containing Drug Products   Insulin Sliding Scale Info: insulin aspart (novoLOG) injection 0-15 Units; Isolation Precautions Info: Contact precautions;Extended spectrum beta lactamase     Current Medications (05/27/2015):  This is the current hospital active medication list Current Facility-Administered Medications  Medication Dose Route Frequency Provider Last Rate Last Dose  . 0.9 %  sodium chloride infusion  250 mL Intravenous PRN Jose Angelo A de Dios, MD      . 0.9 %  sodium chloride infusion   Intravenous  Continuous Chesley Mires, MD 10 mL/hr at 05/21/15 1000    . antiseptic oral rinse (CPC / CETYLPYRIDINIUM CHLORIDE 0.05%) solution 7 mL  7 mL Mouth Rinse q12n4p Carlena Bjornstad, MD   7 mL at 05/26/15 1600  . aspirin chewable tablet 81 mg  81 mg Per Tube Daily Chesley Mires, MD   81 mg at 05/26/15 1100  . ceFEPIme (MAXIPIME) 1 g in dextrose 5 % 50 mL IVPB  1 g Intravenous Q24H [place], MD   1 g at 05/26/15 2325  . heparin  injection 5,000 Units  5,000 Units Subcutaneous 3 times per day Javier Glazier, MD   5,000 Units at 05/27/15 [phone]  . insulin aspart (novoLOG) injection 0-15 Units  0-15 Units Subcutaneous TID WC Jose Shirl Harris, MD   3 Units at 05/27/15 [phone]  . ipratropium-albuterol (DUONEB) 0.5-2.5 (3) MG/3ML nebulizer solution 3 mL  3 mL Nebulization Q2H PRN Chesley Mires, MD      . lactulose (CHRONULAC) 10 GM/15ML solution 20 g  20 g Oral TID Rush Farmer, MD   20 g at 05/25/15 0945  . oxyCODONE-acetaminophen (PERCOCET/ROXICET) 5-325 MG per tablet 1-2 tablet  1-2 tablet Oral Q4H PRN Rush Farmer, MD   1 tablet at 05/27/15 0425  . pantoprazole sodium (PROTONIX) 40 mg/20 mL oral suspension 40 mg  40 mg Per Tube Q24H Chesley Mires, MD   40 mg at 05/24/15 1107  . [place]   Oral PRN [place], MD      . sodium chloride flush (NS) 0.9 % injection 10-40 mL  10-40 mL Intracatheter [place], MD   10 mL at 05/26/15 2224  . sodium chloride flush (NS) 0.9 % injection 10-40 mL  10-40 mL Intracatheter PRN Jose Angelo A Corrie Dandy, MD      . vancomycin (VANCOCIN) 500 mg in sodium chloride 0.9 % 100 mL IVPB  500 mg Intravenous Q24H Jose Shirl Harris, MD   500 mg at 05/27/15 [id number]     Discharge Medications: Please see discharge summary for a list of discharge medications.  Relevant Imaging Results:  Relevant Lab Results:   Additional Information SSN: [place], [place]

## 2015-05-27 NOTE — Progress Notes (Signed)
Patient had 700 output from the chest tube from 5am to 6pm. Drainage box has been changed.

## 2015-05-27 NOTE — Clinical Social Work Placement (Signed)
   CLINICAL SOCIAL WORK PLACEMENT  NOTE  Date:  05/27/2015  Patient Details  Name: Tamara Stephenson MRN: [id number] Date of Birth: Aug 02, 1933  Clinical Social Work is seeking post-discharge placement for this patient at the [place] level of care (*CSW will initial, date and re-position this form in  chart as items are completed):      Patient/family provided with [place] Work Department's list of facilities offering this level of care within the geographic area requested by the patient (or if unable, by the patient's family).      Patient/family informed of their freedom to choose among providers that offer the needed level of care, that participate in Medicare, Medicaid or managed care program needed by the patient, have an available bed and are willing to accept the patient.      Patient/family informed of 's ownership interest in [facility] and [facility], as well as of the fact that they are under no obligation to receive care at these facilities.  PASRR submitted to EDS on       PASRR number received on       Existing PASRR number confirmed on 05/27/15     FL2 transmitted to all facilities in geographic area requested by pt/family on 05/27/15     FL2 transmitted to all facilities within larger geographic area on       Patient informed that his/her managed care company has contracts with or will negotiate with certain facilities, including the following:            Patient/family informed of bed offers received.  Patient chooses bed at       Physician recommends and patient chooses bed at      Patient to be transferred to   on  .  Patient to be transferred to facility by       Patient family notified on   of transfer.  Name of family member notified:        PHYSICIAN Please sign FL2     Additional Comment:    _______________________________________________ Benard Halsted, [place] 05/27/2015, 8:42 AM

## 2015-05-27 NOTE — Progress Notes (Signed)
PULMONARY / CRITICAL CARE MEDICINE   Name: Tamara Stephenson MRN: [id number] DOB: 12-06-1933    ADMISSION DATE:  05/19/2015 CONSULTATION DATE:  05/20/2015  REFERRING MD:  Triad  CHIEF COMPLAINT:  Short of breath  BRIEF:  82 yo female smoker from assisted living with fall.  She was confused, hypothermic, and had elevated lactic acid.  She was found to have large Rt pleural effusion.  She required intubation for acute hypoxic respiratory failure. She had recent tx for Pna, cellulitis, decubitus ulcers at [facility].She has hx of cirrhosis, DM, systolic/diastolic CHF, CKD.  STUDIES: TTE 3/17:  Mild LVH. EF 65-70%. No wall motion abnormality. Grade 1 diastolic dysfunction. LA mildly dilated & RA normal in size. RV normal in size & function. No aortic stenosis or regurg. Trivial mitral regurg w/o stenosis.No pulmonic stenosis. Trivial tricuspid regurg. No pericardial effusion.  Port CXR 3/18:  Patchy bilateral opacities worse in lower lung zones. ETT & chest tube in good position. R IJ in good position.  MICROBIOLOGY: Right Pleural Fluid AFB Ctx 3/15>>> Right Pleural Fluid Fungal Ctx 3/15>>> Right Pleural Fluid Ctx 3/15>>> negative  Tracheal Asp Ctx 3/15>>> negative MRSA PCR 3/15:  Negative Urine Strep Ag 3/15:  Negative Urine Legionella Ag 3/15:  Negative   Urine Ctx 3/14:  Negative  Blood Ctx x2 3/14:  Enterococcus 1/2  ANTIBIOTICS: Vancomycin 3/14 >> Cefepime 3/14 >>   LINES/TUBES: OETT 7.5 3/15>> R IJ CVL 3/15 >> R chest tube 43fr 3/15 >> Foley 3/15>> OGT 3/15>>  SIGNIFICANT EVENTS: 3/14 Admit 3/14 CT head >> diffuse atrophy, chronic white matter ischemic changes 3/14 CT chest >> Rt pleural effusion with compressive ATX of Rt lung 3/14 EEG >> diffuse slowing 3/15 Rt arm doppler >> no DVT 3/15 Palliative care consult >> full code 3/20 extubate  SUBJECTIVE: Patient states that she is feeling better and gets up with a walker.   VITAL SIGNS: BP 122/39 mmHg  Pulse 86   Temp(Src) 98.1 F (36.7 C) (Oral)  Resp 16  Ht 5\' 6"  (1.676 m)  Wt 178 lb 9.2 oz (81 kg)  BMI 28.84 kg/m2  SpO2 96%  VENTILATOR SETTINGS:    INTAKE / OUTPUT: I/O last 3 completed shifts: In: 1110 [P.O.:600; I.V.:310; IV Piggyback:200] Out: [id number] [Urine:1435; Chest Tube:5010]  PHYSICAL EXAMINATION: General:  Alert and interactive, moving all ext to command. Integument:  Warm & dry. No rash on exposed skin.  HEENT:  No scleral injection. Endotracheal tube in place.  Cardiovascular:  Regular rate. Trace lower extremity edema. No appreciable JVD.  Pulmonary:  Good aeration bilaterally. Symmetric chest wall rise on ventilator. Abdomen: Soft. Normal bowel sounds. Protuberant. Hypoactive bowel sounds. Neurological: Patient nods to questions. We will still is on command. Appears grossly nonfocal.   LABS:  PULMONARY  Recent Labs Lab 05/20/15 1807  PHART 7.317*  PCO2ART 40.9  PO2ART 108.0*  HCO3 20.9  TCO2 22  O2SAT 98.0   CBC  Recent Labs Lab 05/25/15 0430 05/26/15 0600 05/27/15 0500  HGB 8.6* 8.4* 8.5*  HCT 27.1* 26.8* 25.9*  WBC 6.9 6.9 7.9  PLT 173 170 176   COAGULATION No results for input(s): INR in the last 168 hours. CARDIAC  Recent Labs Lab 05/20/15 1133 05/20/15 2006  TROPONINI 0.42* 0.40*   No results for input(s): PROBNP in the last 168 hours.  CHEMISTRY  Recent Labs Lab 05/23/15 0412 05/24/15 0345 05/25/15 0430 05/26/15 0600 05/27/15 0500  NA 148* 146* 148* 150* 147*  K 3.5 3.6 3.6 3.7  3.7  CL 115* 118* 116* 119* 118*  CO2 24 23 24 22 23   GLUCOSE 161* 122* 115* 103* 176*  BUN 49* 53* 50* 52* 53*  CREATININE 1.56* 1.49* 1.51* 1.50* 1.55*  CALCIUM 9.1 9.1 9.3 9.3 9.0  MG 2.0 2.2 2.2 2.0 2.0  PHOS 3.6 3.4 3.3 3.3 2.3*   Estimated Creatinine Clearance: 31.6 mL/min (by C-G formula based on Cr of 1.55).  LIVER  Recent Labs Lab 05/20/15 1905 05/22/15 0500 05/24/15 0345 05/25/15 0430 05/26/15 0600 05/27/15 0500  AST  --  59*  --    --   --   --   ALT  --  25  --   --   --   --   ALKPHOS  --  168*  --   --   --   --   BILITOT  --  1.6*  --   --   --   --   PROT 6.0* 5.6*  --   --   --   --   ALBUMIN  --  1.5* 1.4* 1.4* 1.3* 1.3*   INFECTIOUS  Recent Labs Lab 05/23/15 0413  LATICACIDVEN 1.4   ENDOCRINE CBG (last 3)   Recent Labs  05/26/15 1710 05/26/15 1943 05/27/15 0641  GLUCAP 180* 133* 154*   IMAGING x48h No results found. I reviewed CXR myself, CT in place.  ASSESSMENT / PLAN:  A: Acute Hypoxic Respiratory Failure - Secondary to HCAP. HCAP Right Pleural Effusion - S/P Chest Tube 3/15.  Tobacco Use  P:   On RA. DNR.status Encourage Ambulation as tolerated Chest tube to water seal, output remains high, keep at water seal Cleared by speech for mechanical soft diet  With nectar thick liquids.  A Chronic CHF H/O HTN & Dyslipidemia  P:   continue ASA 81mg  daily   Antihypertensives  Per primary  .   A:   Hypernatremia - Mild. CKD Stage II Lactic acidosis - Resolved.  P:   Trending UOP  continue to monitor renal function & electrolytes daily  Replace electrolytes as indicated  A:   Constipation H/O Liver Cirrhosis  P:   continue lactulose to PO. Dysphagia 3 diet per speech.  HEMATOLOGIC A:   Anemia - No signs of active bleeding. Likely due to chronic disease.  P:  Trending cell counts daily w/ CBC.  continue Heparin [place] q8hr. SCDs.  INFECTIOUS A:   HCAP  Parapneumonic Right Effusion Enterococcus Bacteremia - TTE negative.  P:   Day#7 Vancomycin & Cefepime, finish an 8 day course. Follow cultures to completion.  A:   H/O DM - BG controlled.  P:   Accu-Checks ACHS. SSI per algorithm.   A:   Acute Encephalopathy - Hepatic versus Toxic Metabolic-resolving  P:    Continue to hold Bupropion & Neurontin  Continue PO lactulose    Adelayde Minney,AG-ACNP Pulmonary & Critical Care

## 2015-05-27 NOTE — Clinical Social Work Note (Signed)
Clinical Social Work Assessment  Patient Details  Name: Tamara Stephenson MRN: [id number] Date of Birth: Aug 01, 1933  Date of referral:  05/27/15               Reason for consult:  Facility Placement                Permission sought to share information with:  Facility Sport and exercise psychologist, Family Supports Permission granted to share information::  No (Patient states she has no friends or family here.)  Name::        Agency::     Relationship::     Contact Information:     Housing/Transportation Living arrangements for the past 2 months:  Apartment Source of Information:  Patient Patient Interpreter Needed:  None Criminal Activity/Legal Involvement Pertinent to Current Situation/Hospitalization:  No - Comment as needed Significant Relationships:  None Lives with:  Self Do you feel safe going back to the place where you live?  No Need for family participation in patient care:  Yes (Comment)  Care giving concerns:  CSW received referral for possible SNF placement at time of discharge. CSW met with patient regarding PT recommendation of SNF placement at time of discharge. Patient reported [place] but she does have 2 cats. Patient lives in [place] and is currently unable to care for herself at home given patient's current physical needs and fall risk. Patient stated she has been to SNFs in [place] before and would be able to sign herself in. Patient expressed understanding of PT recommendation and are agreeable to SNF placement at time of discharge. CSW to continue to follow and assist with discharge planning needs.   Social Worker assessment / plan:  CSW spoke with patient concerning possibility of rehab at [facility] before returning home.  Employment status:  Retired Nurse, adult PT Recommendations:  [place] / Referral to community resources:  [place]  Patient/Family's Response to care:  Patient recognizes need for rehab before returning home and is agreeable to a SNF in [place].   Patient/Family's Understanding of and Emotional Response to Diagnosis, Current Treatment, and Prognosis:  Patient is realistic regarding therapy needs. No questions/concerns about plan or treatment.    Emotional Assessment Appearance:  Appears stated age Attitude/Demeanor/Rapport:   (Appropriate) Affect (typically observed):  Accepting Orientation:  Oriented to Self, Oriented to Place Alcohol / Substance use:  Not Applicable Psych involvement (Current and /or in the community):  No (Comment)  Discharge Needs  Concerns to be addressed:  Care Coordination Readmission within the last 30 days:  No Current discharge risk:  None, Lack of support system Barriers to Discharge:  Continued Medical Work up   [employer], [place] 05/27/2015, 11:14 AM

## 2015-05-28 ENCOUNTER — Inpatient Hospital Stay (HOSPITAL_COMMUNITY): Payer: Medicare HMO

## 2015-05-28 LAB — GLUCOSE, CAPILLARY
GLUCOSE-CAPILLARY: 140 mg/dL — AB (ref 65–99)
Glucose-Capillary: 206 mg/dL — ABNORMAL HIGH (ref 65–99)
Glucose-Capillary: 164 mg/dL — ABNORMAL HIGH (ref 65–99)
Glucose-Capillary: 296 mg/dL — ABNORMAL HIGH (ref 65–99)

## 2015-05-28 LAB — RENAL FUNCTION PANEL
Albumin: 1.2 g/dL — ABNORMAL LOW (ref 3.5–5.0)
Anion gap: 7 (ref 5–15)
BUN: 46 mg/dL — AB (ref 6–20)
CHLORIDE: 115 mmol/L — AB (ref 101–111)
CO2: 24 mmol/L (ref 22–32)
CREATININE: 1.59 mg/dL — AB (ref 0.44–1.00)
Calcium: 8.9 mg/dL (ref 8.9–10.3)
GFR calc Af Amer: 35 mL/min — ABNORMAL LOW (ref >60)
GFR, EST NON AFRICAN AMERICAN: 30 mL/min — AB (ref >60)
GLUCOSE: 171 mg/dL — AB (ref 65–99)
Phosphorus: 2.3 mg/dL — ABNORMAL LOW (ref 2.5–4.6)
Potassium: 4.3 mmol/L (ref 3.5–5.1)
Sodium: 146 mmol/L — ABNORMAL HIGH (ref 135–145)

## 2015-05-28 LAB — CBC WITH DIFFERENTIAL/PLATELET
Basophils Absolute: 0 10*3/uL (ref 0.0–0.1)
Basophils Relative: 0 %
EOS ABS: 0.3 10*3/uL (ref 0.0–0.7)
EOS PCT: 4 %
HCT: 26.1 % — ABNORMAL LOW (ref 36.0–46.0)
Hemoglobin: 8.7 g/dL — ABNORMAL LOW (ref 12.0–15.0)
LYMPHS ABS: 3.3 10*3/uL (ref 0.7–4.0)
Lymphocytes Relative: 44 %
MCH: 34 pg (ref 26.0–34.0)
MCHC: 33.3 g/dL (ref 30.0–36.0)
MCV: 102 fL — AB (ref 78.0–100.0)
MONO ABS: 1.1 10*3/uL — AB (ref 0.1–1.0)
MONOS PCT: 13 %
Neutro Abs: 3.1 10*3/uL (ref 1.7–7.7)
Neutrophils Relative %: 39 %
PLATELETS: 163 10*3/uL (ref 150–400)
RBC: 2.56 MIL/uL — ABNORMAL LOW (ref 3.87–5.11)
RDW: 19.8 % — AB (ref 11.5–15.5)
WBC: 7.8 10*3/uL (ref 4.0–10.5)

## 2015-05-28 MED ORDER — SODIUM PHOSPHATE 3 MMOLE/ML IV SOLN
10.0000 mmol | Freq: Once | INTRAVENOUS | Status: AC
  Administered 2015-05-28: 10 mmol via INTRAVENOUS
  Filled 2015-05-28 (×2): qty 3.33

## 2015-05-28 NOTE — Progress Notes (Signed)
PULMONARY / CRITICAL CARE MEDICINE   Name: Tamara Stephenson MRN: [id number] DOB: [birth year]    ADMISSION DATE:  05/19/2015 CONSULTATION DATE:  05/20/2015  REFERRING MD:  Triad  CHIEF COMPLAINT:  Short of breath  BRIEF:  82 yo female smoker from assisted living with fall.  She was confused, hypothermic, and had elevated lactic acid.  She was found to have large Rt pleural effusion.  She required intubation for acute hypoxic respiratory failure. She had recent tx for Pna, cellulitis, decubitus ulcers at [facility].She has hx of cirrhosis, DM, systolic/diastolic CHF, CKD.  STUDIES: TTE 3/17:  Mild LVH. EF 65-70%. No wall motion abnormality. Grade 1 diastolic dysfunction. LA mildly dilated & RA normal in size. RV normal in size & function. No aortic stenosis or regurg. Trivial mitral regurg w/o stenosis.No pulmonic stenosis. Trivial tricuspid regurg. No pericardial effusion.  Port CXR 3/18:  Patchy bilateral opacities worse in lower lung zones. ETT & chest tube in good position. R IJ in good position.  MICROBIOLOGY: Right Pleural Fluid AFB Ctx 3/15>>> Right Pleural Fluid Fungal Ctx 3/15>>> Right Pleural Fluid Ctx 3/15>>> negative  Tracheal Asp Ctx 3/15>>> negative MRSA PCR 3/15:  Negative Urine Strep Ag 3/15:  Negative Urine Legionella Ag 3/15:  Negative   Urine Ctx 3/14:  Negative  Blood Ctx x2 3/14:  Enterococcus 1/2  ANTIBIOTICS: Vancomycin 3/14 >> Cefepime 3/14 >>   LINES/TUBES: OETT 7.5 3/15>> R IJ CVL 3/15 >> R chest tube 58fr 3/15 >> Foley 3/15>> OGT 3/15>>out  SIGNIFICANT EVENTS: 3/14 Admit 3/14 CT head >> diffuse atrophy, chronic white matter ischemic changes 3/14 CT chest >> Rt pleural effusion with compressive ATX of Rt lung 3/14 EEG >> diffuse slowing 3/15 Rt arm doppler >> no DVT 3/15 Palliative care consult >> full code 3/20 extubate  SUBJECTIVE:  05/28/15  - Patient says she is feeling better and just want some water to drink.   VITAL SIGNS: BP 104/47 mmHg   Pulse 79  Temp(Src) 97.6 F (36.4 C) (Oral)  Resp 18  Ht 5\' 6"  (1.676 m)  Wt 166 lb 14.2 oz (75.7 kg)  BMI 26.95 kg/m2  SpO2 96%  VENTILATOR SETTINGS:    INTAKE / OUTPUT: I/O last 3 completed shifts: In: 29 [P.O.:360; I.V.:130; IV Piggyback:50] Out: 2600 [Urine:650; Chest Tube:1950]  PHYSICAL EXAMINATION: General:  Elderly sickly appearing female Integument:  Wounds on b/l extremities covered by bandage, warm, red HEENT: Atraumatic, normocephalic, no discharge Cardiovascular: S1S2, rrr. No MRG Pulmonary expiratory wheezes, subcutaneous emphysema on the right, no rales, rhonchi or crackles noted. Abdomen: Soft ,nontender hypoactive bowel sounds Neurological: Awake, alert ,oriented answers appropriately to orientation question.  LABS:  PULMONARY No results for input(s): PHART, PCO2ART, PO2ART, HCO3, TCO2, O2SAT in the last 168 hours.  Invalid input(s): PCO2, PO2 CBC  Recent Labs Lab 05/26/15 0600 05/27/15 0500 05/28/15 0432  HGB 8.4* 8.5* 8.7*  HCT 26.8* 25.9* 26.1*  WBC 6.9 7.9 7.8  PLT 170 176 163   COAGULATION No results for input(s): INR in the last 168 hours. CARDIAC No results for input(s): TROPONINI in the last 168 hours. No results for input(s): PROBNP in the last 168 hours.  CHEMISTRY  Recent Labs Lab 05/23/15 0412 05/24/15 0345 05/25/15 0430 05/26/15 0600 05/27/15 0500 05/28/15 0432  NA 148* 146* 148* 150* 147* 146*  K 3.5 3.6 3.6 3.7 3.7 4.3  CL 115* 118* 116* 119* 118* 115*  CO2 24 23 24 22 23 24   GLUCOSE 161* 122* 115* 103* 176*  171*  BUN 49* 53* 50* 52* 53* 46*  CREATININE 1.56* 1.49* 1.51* 1.50* 1.55* 1.59*  CALCIUM 9.1 9.1 9.3 9.3 9.0 8.9  MG 2.0 2.2 2.2 2.0 2.0  --   PHOS 3.6 3.4 3.3 3.3 2.3* 2.3*   Estimated Creatinine Clearance: 29.8 mL/min (by C-G formula based on Cr of 1.59).  LIVER  Recent Labs Lab 05/22/15 0500 05/24/15 0345 05/25/15 0430 05/26/15 0600 05/27/15 0500 05/28/15 0432  AST 59*  --   --   --   --   --    ALT 25  --   --   --   --   --   ALKPHOS 168*  --   --   --   --   --   BILITOT 1.6*  --   --   --   --   --   PROT 5.6*  --   --   --   --   --   ALBUMIN 1.5* 1.4* 1.4* 1.3* 1.3* 1.2*   INFECTIOUS  Recent Labs Lab 05/23/15 0413  LATICACIDVEN 1.4   ENDOCRINE CBG (last 3)   Recent Labs  05/27/15 1710 05/27/15 2034 05/28/15 0648  GLUCAP 240* 187* 140*   IMAGING x48h Dg Chest Port 1 View  05/28/2015  CLINICAL DATA:  Shortness of breath. EXAM: PORTABLE CHEST 1 VIEW COMPARISON:  05/25/2015. FINDINGS: Interim extubation and removal of NG tube. Right IJ line in stable position. Right chest tube in stable position with tip projected over the medial right costophrenic angle. Tiny right apical pneumothorax noted on today's exam. a right chest wall subcutaneous emphysema noted on today's exam. Heart size stable. Right base atelectasis and/or infiltrate again noted. IMPRESSION: 1. Interim extubation removal of NG tube. Right IJ line and right chest tube in stable position. New onset tiny right apical pneumothorax. New onset right chest wall subcutaneous emphysema. 2. Persistent right lower lobe atelectasis and/or infiltrate. Critical Value/emergent results were called by telephone at the time of interpretation on 05/28/2015 at 7:45 am to nurse Tiffany, who verbally acknowledged these results. Electronically Signed   By: Marcello Moores  Register   On: 05/28/2015 07:48   I reviewed CXR myself, CT in place.  ASSESSMENT / PLAN:  A: Acute Hypoxic Respiratory Failure - Secondary to HCAP. HCAP Right Pleural Effusion - S/P Chest Tube 3/15.  Tobacco Use  P:   On RA. DNR.status Encourage Ambulation as tolerated Chest tube to water seal, output  Still remains high(700/24 hours), keep at water seal Mechanical soft diet with nectar thick liquids recommended by speech  A Chronic CHF H/O HTN & Dyslipidemia  P:   continue ASA 81mg  daily   Antihypertensives  Per primary  .   A:   Hypernatremia -  Mild. CKD Stage II Lactic acidosis - Resolved.  P:   Trending UOP  continue to monitor renal function & electrolytes daily  Replace electrolytes as indicated  A:   Constipation H/O Liver Cirrhosis  P:   continue lactulose to PO. Dysphagia 3 diet per speech.  HEMATOLOGIC A:   Anemia - No signs of active bleeding. Likely due to chronic disease.  P:  Trending cell counts daily w/ CBC.  continue Heparin [place] q8hr. SCDs.  INFECTIOUS A:   HCAP  Parapneumonic Right Effusion Enterococcus Bacteremia - TTE negative. Positive blood culture with enterococcus 1/2 bottles P:   Day#8 Vancomycin & Cefepime, finish an 8 day course. Id recommendation against treatment only positive in 1 out of 2 bottles Follow  cultures to completion.  A:   H/O DM - BG controlled.  P:   Accu-Checks ACHS. SSI per algorithm.   A:   Acute Encephalopathy - Hepatic versus Toxic Metabolic-resolving  P:    Continue to hold Bupropion & Neurontin  Continue PO lactulose     Bincy Varughese,AG-ACNP Pulmonary & Critical Care     STAFF NOTE: I, Dr Ann Lions have personally reviewed patient's available data, including medical history, events of note, physical examination and test results as part of my evaluation. I have discussed with resident/NP and other care providers such as pharmacist, RN and RRT.  In addition,  I personally evaluated patient and elicited key findings of   S: PCCM following for right chest tube. APP reports continued high output from right chest tube. Per hospitalist - concern is hepatic hydrothotrax. Chart review - [place] in 2015 shows cirrhosis. Rt thora 05/20/15 - borderline transudate/exudate and currently fluid is very straw colored like transudate.  Cultures negative  O: deconditioned female Sleeping when I went to see her  cxr - visualized ? Small Rt Ptx and chest ube  A: Agree with concern for hepatic hydrothorax Possible new ptx 05/28/15  P: repeat US for cirrhosis and  ascites - if increasing suspicion for hepatic hydrothorax then chest tube mgmt will not work - ? TIPS needed Repeat cxr stat for ptx eval   .  Rest per NP/medical resident whose note is outlined above and that I agree with   Dr. Brand Males, M.D., [facility].C.P Pulmonary and Critical Care Medicine Staff Physician [place] [facility] Pager: [phone], If no answer or between  15:00h - 7:00h: call [phone]  05/28/2015 6:20 PM

## 2015-05-28 NOTE — Care Management Note (Addendum)
Case Management Note Previous CM note initiated by Geri Seminole RN, CM  Patient Details  Name: Tamara Stephenson MRN: [id number] Date of Birth: 11-Oct-1933  Subjective/Objective:                  Altered mental status.  Action/Plan: Discharge planning Expected Discharge Date:  05/05/15               Expected Discharge Plan:  [place]  In-House Referral:  Clinical Social Work  Discharge planning Services  CM Consult  Post Acute Care Choice:    Choice offered to:     DME Arranged:    DME Agency:     HH Arranged:  RN, PT, OT [place] Agency:  Other - See comment  Status of Service:     Medicare Important Message Given:    Date Medicare IM Given:    Medicare IM give by:    Date Additional Medicare IM Given:    Additional Medicare Important Message give by:     If discussed at [place] of Stay Meetings, dates discussed:  05/28/15  Additional Comments:  05/28/15- 1130- [name] RN, BSN- Pt recommending SNF- CSW following for possible SNF placement- unsure if pt has capacity for decisions - PC consulted - HCPOA-  CM will follow for any assistance needed.    CM met with pt in room who confirms she lives at [facility] in [facility] and is already active with [facility].  Pt states she has all the DME needed at home. Cm received call from [facility] who state they have access to [facility] and please ensure orders have been placed to resume [place] care.  CM texted MD who placed order for HHPT/OT/RN.  CSW arranging transportation home.  No other CM needs were communicated.   Dawayne Patricia, RN 05/28/2015, 11:35 AM

## 2015-05-28 NOTE — Progress Notes (Signed)
SLP Cancellation Note  Patient Details Name: Tamara Stephenson MRN: [id number] DOB: 20-Aug-1933   Cancelled treatment:       Reason Eval/Treat Not Completed: Fatigue/lethargy limiting ability to participate. Upon arrival, patient was asleep while upright in the bed with her lunch meal in front of her. RN reported patient had recently received pain medicine and had since been lethargic. SLP unable to arouse patient for more than 20 second intervals, therefore, PO trials were not administered and the patient's tray was removed from in front of her. RN made aware.   Weston Anna, [place], [place]   Weston Anna 05/28/2015, 2:37 PM

## 2015-05-28 NOTE — Progress Notes (Signed)
Palliative Medicine RN Note: saw pt to determine whether she can name HCPOA. Pt is confused, repeats herself, does not answer questions directly. She is pleasant, smiling, relaxed and asking for more V8 and orange juice. RN reports this is consistent with her mental status through the day and that she has been drinking a lot of V8 and OJ. Plan f/u by team member tomorrow to eval for capacity to make decisions re: HCPOA. Larina Earthly, RN, BSN, [facility] 05/28/2015 8:45 AM Cell [phone] 8:00-4:00 Monday-Friday Office [phone]

## 2015-05-28 NOTE — Progress Notes (Signed)
Utilization review completed.  

## 2015-05-28 NOTE — Progress Notes (Signed)
PROGRESS NOTE  Tamara Stephenson [id number] DOB: [birth year] DOA: 05/19/2015 PCP: Curly Rim, MD Outpatient Specialists:    LOS: 9 days   Brief Narrative: 82 yo F with history of DM, liver cirrhosis, CHF, CKD, admitted on 3/15 with hypothermia, elevated lactic acid and acute encephalopathy, felt to be septic.   Assessment & Plan: Principal Problem:   Sepsis ([place]) Active Problems:   CKD (chronic kidney disease) stage 3, GFR 30-59 ml/min   Anemia of chronic disease   Diabetes mellitus with renal manifestations, uncontrolled (HCC)   Cirrhosis of liver with ascites (HCC)   HCAP (healthcare-associated pneumonia)   Dehydration   CHF (congestive heart failure) (HCC)   Pressure ulcer   Acute encephalopathy   Pleural effusion   Acute respiratory failure with hypoxia ([place])   Demand ischemia (HCC)   Encounter for central line placement   Respiratory failure ([place])   Chest tube in place   Pleural effusion, right   Palliative care encounter   Goals of care, counseling/discussion   Acute hypoxic respiratory failure due to HCAP and right sided pleural effusion - right pleural effusion s/p chest tubs placement 3/15. PCCM managing. Significant output still, wonder if this is related a component of hepatic hydrothorax and will just re-accumulate  - was on vent until 3/20, extubated, comfortable on room air today  - finished 8 days for HCAP coverage on 3/22  Liver cirrhosis - decompensated with hepatic encephalopathy, ascites - continue Lactulose - continue to hold Lasix as she appears intravascularly depleted with hypernatremia  DM - last A1C 6.6 in 12/2014 - continue SSI  Hypernatremia - improving, encouraged po intake - monitor  Anemia of critical illness - stable, no bleeding  Acute encephalopathy - improving, mental status fluctuates, appears better today   HTN - hold home antihypertensives  AKI on CKD III-IV - renal function stable, monitor  Hypotension - thought  to be due to sedation  Positive blood culture with enterococcus 1/2 bottles - ID consulted, recommended against treatment as it was only 1/2 bottles and significance is unclear.   Chronic combined CHF - repeat echo 05/2015 with recovery of her EF, now 65-70%, grade 1 dd   DVT prophylaxis: heparin Code Status: DNR Family Communication: no family bedside Disposition Plan: TBD Barriers for discharge: chest tube with significant output  Consultants:   PCCM  ID  STUDIES: TTE 3/17: Mild LVH. EF 65-70%. No wall motion abnormality. Grade 1 diastolic dysfunction. LA mildly dilated & RA normal in size. RV normal in size & function. No aortic stenosis or regurg. Trivial mitral regurg w/o stenosis.No pulmonic stenosis. Trivial tricuspid regurg. No pericardial effusion.  Port CXR 3/18: Patchy bilateral opacities worse in lower lung zones. ETT & chest tube in good position. R IJ in good position. MICROBIOLOGY: Right Pleural Fluid AFB Ctx 3/15>>> Right Pleural Fluid Fungal Ctx 3/15>>> Right Pleural Fluid Ctx 3/15>>> negative  Tracheal Asp Ctx 3/15>>> negative MRSA PCR 3/15: Negative Urine Strep Ag 3/15: Negative Urine Legionella Ag 3/15: Negative  Urine Ctx 3/14: Negative  Blood Ctx x2 3/14: Enterococcus 1/2 ANTIBIOTICS: Vancomycin 3/14 >> Cefepime 3/14 >>  LINES/TUBES:  OETT 7.5 3/15>>  R IJ CVL 3/15 >>  R chest tube 87fr 3/15 >>  Foley 3/15>>  OGT 3/15>>  SIGNIFICANT EVENTS:  3/14 Admit  3/14 CT head >> diffuse atrophy, chronic white matter ischemic changes  3/14 CT chest >> Rt pleural effusion with compressive ATX of Rt lung  3/14 EEG >> diffuse slowing  3/15 Rt arm  doppler >> no DVT  3/15 Palliative care consult >> full code  3/20 extubate 3/22 TRH took over  Antimicrobials:  Vancomycin 3/15 >>  Cefepime 3/15 >>   Subjective: - No chest pain, shortness of breath. No abdominal pain, nausea or vomiting. More oriented today, however still with mild confusion     Objective: Filed Vitals:   05/27/15 1332 05/27/15 2035 05/28/15 0444 05/28/15 0459  BP: 109/41 105/46 104/47   Pulse: 84 82 79   Temp: 98.2 F (36.8 C) 97.9 F (36.6 C) 97.6 F (36.4 C)   TempSrc: Oral Oral Oral   Resp: 18 18 18    Height:      Weight:    75.7 kg (166 lb 14.2 oz)  SpO2: 97% 99% 96%     Intake/Output Summary (Last 24 hours) at 05/28/15 1035 Last data filed at 05/28/15 0900  Gross per 24 hour  Intake      0 ml  Output   2950 ml  Net  -2950 ml   Filed Weights   05/26/15 0407 05/27/15 0436 05/28/15 0459  Weight: 80.5 kg (177 lb 7.5 oz) 81 kg (178 lb 9.2 oz) 75.7 kg (166 lb 14.2 oz)    Examination: BP 104/47 mmHg  Pulse 79  Temp(Src) 97.6 F (36.4 C) (Oral)  Resp 18  Ht 5\' 6"  (1.676 m)  Wt 75.7 kg (166 lb 14.2 oz)  BMI 26.95 kg/m2  SpO2 96%  GENERAL: NAD  HEENT: head NCAT, no scleral icterus. Pupils round and reactive.   NECK: Supple. No carotid bruits. No lymphadenopathy or thyromegaly.  LUNGS: Clear to auscultation. No wheezing or crackles. Chest tube in place  HEART: Regular rate and rhythm without murmur. 2+ pulses, no JVD, no peripheral edema  ABDOMEN: Soft, nontender, and nondistended. Positive bowel sounds.   EXTREMITIES: Without any cyanosis or clubbing  NEUROLOGIC: non focal    Data Reviewed: I have personally reviewed following labs and imaging studies  CBC:  Recent Labs Lab 05/24/15 0345 05/25/15 0430 05/26/15 0600 05/27/15 0500 05/28/15 0432  WBC 8.0 6.9 6.9 7.9 7.8  NEUTROABS 4.1 3.3 3.2 3.6 3.1  HGB 8.4* 8.6* 8.4* 8.5* 8.7*  HCT 25.4* 27.1* 26.8* 25.9* 26.1*  MCV 99.6 100.4* 100.8* 101.2* 102.0*  PLT 175 173 170 176 [id number]   Basic Metabolic Panel:  Recent Labs Lab 05/23/15 0412 05/24/15 0345 05/25/15 0430 05/26/15 0600 05/27/15 0500 05/28/15 0432  NA 148* 146* 148* 150* 147* 146*  K 3.5 3.6 3.6 3.7 3.7 4.3  CL 115* 118* 116* 119* 118* 115*  CO2 24 23 24 22 23 24   GLUCOSE 161* 122* 115* 103* 176* 171*   BUN 49* 53* 50* 52* 53* 46*  CREATININE 1.56* 1.49* 1.51* 1.50* 1.55* 1.59*  CALCIUM 9.1 9.1 9.3 9.3 9.0 8.9  MG 2.0 2.2 2.2 2.0 2.0  --   PHOS 3.6 3.4 3.3 3.3 2.3* 2.3*   GFR: Estimated Creatinine Clearance: 29.8 mL/min (by C-G formula based on Cr of 1.59). Liver Function Tests:  Recent Labs Lab 05/22/15 0500 05/24/15 0345 05/25/15 0430 05/26/15 0600 05/27/15 0500 05/28/15 0432  AST 59*  --   --   --   --   --   ALT 25  --   --   --   --   --   ALKPHOS 168*  --   --   --   --   --   BILITOT 1.6*  --   --   --   --   --  PROT 5.6*  --   --   --   --   --   ALBUMIN 1.5* 1.4* 1.4* 1.3* 1.3* 1.2*   No results for input(s): LIPASE, AMYLASE in the last 168 hours.  Recent Labs Lab 05/24/15 1145  AMMONIA 61*   Cardiac Enzymes: No results for input(s): CKTOTAL, CKMB, CKMBINDEX, TROPONINI in the last 168 hours. CBG:  Recent Labs Lab 05/27/15 0641 05/27/15 1124 05/27/15 1710 05/27/15 2034 05/28/15 0648  GLUCAP 154* 200* 240* 187* 140*   Urine analysis:    Component Value Date/Time   COLORURINE YELLOW 05/19/2015 2022   APPEARANCEUR CLEAR 05/19/2015 2022   LABSPEC 1.015 05/19/2015 2022   PHURINE 5.5 05/19/2015 2022   GLUCOSEU NEGATIVE 05/19/2015 2022   HGBUR NEGATIVE 05/19/2015 2022   BILIRUBINUR NEGATIVE 05/19/2015 2022   KETONESUR NEGATIVE 05/19/2015 2022   PROTEINUR NEGATIVE 05/19/2015 2022   UROBILINOGEN 1.0 01/06/2015 1225   NITRITE NEGATIVE 05/19/2015 2022   LEUKOCYTESUR NEGATIVE 05/19/2015 2022   Sepsis Labs: Invalid input(s): PROCALCITONIN, LACTICIDVEN  Recent Results (from the past 240 hour(s))  Blood Culture (routine x 2)     Status: None   Collection Time: 05/19/15  5:34 PM  Result Value Ref Range Status   Specimen Description BLOOD RIGHT EJ  Final   Special Requests BOTTLES DRAWN AEROBIC ONLY [place]  Final   Culture  Setup Time   Final    GRAM POSITIVE COCCI IN PAIRS IN CLUSTERS AEROBIC BOTTLE ONLY CRITICAL RESULT CALLED TO, READ BACK BY AND  VERIFIED WITH: A. AGUIRRE,RN AT [id number] ON [id number] BY Rhea Bleacher    Culture   Final    ENTEROCOCCUS SPECIES STAPHYLOCOCCUS SPECIES (COAGULASE NEGATIVE) THE SIGNIFICANCE OF ISOLATING THIS ORGANISM FROM A SINGLE SET OF BLOOD CULTURES WHEN MULTIPLE SETS ARE DRAWN IS UNCERTAIN. PLEASE NOTIFY THE MICROBIOLOGY DEPARTMENT WITHIN ONE WEEK IF SPECIATION AND SENSITIVITIES ARE REQUIRED.    Report Status 05/22/2015 FINAL  Final   Organism ID, Bacteria ENTEROCOCCUS SPECIES  Final      Susceptibility   Enterococcus species - MIC*    AMPICILLIN <=2 SENSITIVE Sensitive     VANCOMYCIN 1 SENSITIVE Sensitive     GENTAMICIN SYNERGY SENSITIVE Sensitive     * ENTEROCOCCUS SPECIES  Urine culture     Status: None   Collection Time: 05/19/15  8:22 PM  Result Value Ref Range Status   Specimen Description URINE, CATHETERIZED  Final   Special Requests NONE  Final   Culture NO GROWTH 2 DAYS  Final   Report Status 05/21/2015 FINAL  Final  Blood Culture (routine x 2)     Status: None   Collection Time: 05/19/15  8:45 PM  Result Value Ref Range Status   Specimen Description BLOOD LEFT ANTECUBITAL  Final   Special Requests BOTTLES DRAWN AEROBIC AND ANAEROBIC 5CC  Final   Culture NO GROWTH 5 DAYS  Final   Report Status 05/24/2015 FINAL  Final  MRSA PCR Screening     Status: None   Collection Time: 05/20/15  3:42 PM  Result Value Ref Range Status   MRSA by PCR NEGATIVE NEGATIVE Final    Comment:        The GeneXpert MRSA Assay (FDA approved for NASAL specimens only), is one component of a comprehensive MRSA colonization surveillance program. It is not intended to diagnose MRSA infection nor to guide or monitor treatment for MRSA infections.   Culture, respiratory (tracheal aspirate)     Status: None   Collection Time: 05/20/15  6:11  PM  Result Value Ref Range Status   Specimen Description TRACHEAL ASPIRATE  Final   Special Requests Normal  Final   Gram Stain   Final    MODERATE WBC PRESENT,  PREDOMINANTLY PMN NO SQUAMOUS EPITHELIAL CELLS SEEN NO ORGANISMS SEEN Performed at [facility]    Culture   Final    NO GROWTH 2 DAYS Performed at [facility]    Report Status 05/23/2015 FINAL  Final  Acid Fast Smear (AFB)     Status: None   Collection Time: 05/21/15  8:30 AM  Result Value Ref Range Status   AFB Specimen Processing Concentration  Final   Acid Fast Smear Negative  Final    Comment: (NOTE) Performed At: [facility] [address] [place], [place] [id number] Lindon Romp MD [id number]    Source (AFB) FLUID  Final    Comment: PLEURAL  Fungus Culture With Stain (Not @ [facility])     Status: None (Preliminary result)   Collection Time: 05/21/15  8:30 AM  Result Value Ref Range Status   Fungus Stain Final report  Final    Comment: (NOTE) Performed At: [facility] [place], [place] [id number] Lindon Romp MD [id number]    Fungus (Mycology) Culture PENDING  Incomplete   Fungal Source FLUID  Final    Comment: PLEURAL  Body fluid culture     Status: None   Collection Time: 05/21/15  8:30 AM  Result Value Ref Range Status   Specimen Description FLUID PLEURAL  Final   Special Requests NONE  Final   Gram Stain   Final    MODERATE WBC PRESENT,BOTH PMN AND MONONUCLEAR NO ORGANISMS SEEN    Culture NO GROWTH 3 DAYS  Final   Report Status 05/24/2015 FINAL  Final  Fungus Culture Result     Status: None   Collection Time: 05/21/15  8:30 AM  Result Value Ref Range Status   Result 1 Comment  Final    Comment: (NOTE) KOH/Calcofluor preparation:  no fungus observed. Performed At: [facility] [place], [place] [id number] Lindon Romp MD [id number]     Radiology Studies: Dg Chest [facility] 1 View  05/28/2015  CLINICAL DATA:  Shortness of breath. EXAM: PORTABLE CHEST 1 VIEW COMPARISON:  05/25/2015. FINDINGS: Interim extubation and removal of NG tube. Right IJ line in stable position.  Right chest tube in stable position with tip projected over the medial right costophrenic angle. Tiny right apical pneumothorax noted on today's exam. a right chest wall subcutaneous emphysema noted on today's exam. Heart size stable. Right base atelectasis and/or infiltrate again noted. IMPRESSION: 1. Interim extubation removal of NG tube. Right IJ line and right chest tube in stable position. New onset tiny right apical pneumothorax. New onset right chest wall subcutaneous emphysema. 2. Persistent right lower lobe atelectasis and/or infiltrate. Critical Value/emergent results were called by telephone at the time of interpretation on 05/28/2015 at 7:45 am to nurse Tiffany, who verbally acknowledged these results. Electronically Signed   By: Marcello Moores  Register   On: 05/28/2015 07:48    Scheduled Meds: . antiseptic oral rinse  7 mL Mouth Rinse q12n4p  . aspirin  81 mg Per Tube Daily  . heparin subcutaneous  5,000 Units Subcutaneous 3 times per day  . insulin aspart  0-15 Units Subcutaneous TID WC  . lactulose  20 g Oral TID  . pantoprazole sodium  40 mg Per Tube Q24H  . sodium chloride flush  10-40 mL Intracatheter Q12H   Continuous Infusions: . sodium chloride 10 mL/hr at 05/21/15 1000    Marzetta Board, MD, PhD [facility] Pager [phone]  If 7PM-7AM, please contact night-coverage [email or web address] Password [facility] 05/28/2015, 10:35 AM

## 2015-05-28 NOTE — Progress Notes (Signed)
[place] Progress Note Patient Name: Tamara Stephenson DOB: 02-Mar-1934 MRN: [id number]   Date of Service  05/28/2015  HPI/Events of Note  Port CXR Shows stable small right apical pneumothorax on my review. Contacted bedside nurse reports patient is saturating normally on room air without complaint or breathing difficulties.  eICU Interventions  1. Already ordered for repeat chest x-ray in the morning at 0600 hrs. 2. Instructed nurse to place the patient on 2 L/m via nasal cannula 3. Instructed nurse to contact me if she developed any new chest discomfort or difficulty breathing     Intervention Category Major Interventions: Other:  Tera Partridge 05/28/2015, 9:35 PM

## 2015-05-28 NOTE — Progress Notes (Signed)
Inpatient Diabetes Program Recommendations  AACE/ADA: New Consensus Statement on Inpatient Glycemic Control (2015)  Target Ranges:  Prepandial:   less than 140 mg/dL      Peak postprandial:   less than 180 mg/dL (1-2 hours)      Critically ill patients:  140 - 180 mg/dL  Results for HUGH, KENTON (MRN [id number]) as of 05/28/2015 10:31  Ref. Range 05/27/2015 06:41 05/27/2015 11:24 05/27/2015 17:10 05/27/2015 20:34 05/28/2015 06:48  Glucose-Capillary Latest Ref Range: 65-99 mg/dL 154 (H) 200 (H) 240 (H) 187 (H) 140 (H)   Review of Glycemic Control  Diabetes history: DM2 Outpatient Diabetes medications: Lantus 7 units BID Current orders for Inpatient glycemic control: Novolog 0-15 units TID with meals  Inpatient Diabetes Program Recommendations: Insulin - Meal Coverage: If patient is eating at least 50% of meals, please consider ordering Novolog 3 units TID with meals for meal coverage.  Thanks, Barnie Alderman, RN, MSN, CDE Diabetes Coordinator Inpatient Diabetes Program [phone] (Team Pager from [place] to [place]) [phone] ([facility] office) [phone] [facility] office) [phone] [facility] office)

## 2015-05-29 ENCOUNTER — Inpatient Hospital Stay (HOSPITAL_COMMUNITY): Payer: Medicare HMO

## 2015-05-29 DIAGNOSIS — K7460 Unspecified cirrhosis of liver: Secondary | ICD-10-CM | POA: Insufficient documentation

## 2015-05-29 LAB — GLUCOSE, CAPILLARY
GLUCOSE-CAPILLARY: 214 mg/dL — AB (ref 65–99)
Glucose-Capillary: 270 mg/dL — ABNORMAL HIGH (ref 65–99)

## 2015-05-29 LAB — CBC WITH DIFFERENTIAL/PLATELET
BASOS ABS: 0 10*3/uL (ref 0.0–0.1)
BASOS PCT: 1 %
EOS ABS: 0.4 10*3/uL (ref 0.0–0.7)
Eosinophils Relative: 5 %
HCT: 27.9 % — ABNORMAL LOW (ref 36.0–46.0)
Hemoglobin: 8.9 g/dL — ABNORMAL LOW (ref 12.0–15.0)
Lymphocytes Relative: 34 %
Lymphs Abs: 2.7 10*3/uL (ref 0.7–4.0)
MCH: 32 pg (ref 26.0–34.0)
MCHC: 31.9 g/dL (ref 30.0–36.0)
MCV: 100.4 fL — AB (ref 78.0–100.0)
Monocytes Absolute: 0.9 10*3/uL (ref 0.1–1.0)
Monocytes Relative: 12 %
NEUTROS PCT: 48 %
Neutro Abs: 3.9 10*3/uL (ref 1.7–7.7)
PLATELETS: 185 10*3/uL (ref 150–400)
RBC: 2.78 MIL/uL — AB (ref 3.87–5.11)
RDW: 19.7 % — ABNORMAL HIGH (ref 11.5–15.5)
WBC: 8 10*3/uL (ref 4.0–10.5)

## 2015-05-29 LAB — RENAL FUNCTION PANEL
Albumin: 1.4 g/dL — ABNORMAL LOW (ref 3.5–5.0)
Anion gap: 6 (ref 5–15)
BUN: 48 mg/dL — ABNORMAL HIGH (ref 6–20)
CALCIUM: 8.9 mg/dL (ref 8.9–10.3)
CO2: 23 mmol/L (ref 22–32)
CREATININE: 1.73 mg/dL — AB (ref 0.44–1.00)
Chloride: 114 mmol/L — ABNORMAL HIGH (ref 101–111)
GFR, EST AFRICAN AMERICAN: 31 mL/min — AB (ref >60)
GFR, EST NON AFRICAN AMERICAN: 27 mL/min — AB (ref >60)
Glucose, Bld: 262 mg/dL — ABNORMAL HIGH (ref 65–99)
Phosphorus: 3.7 mg/dL (ref 2.5–4.6)
Potassium: 4.3 mmol/L (ref 3.5–5.1)
SODIUM: 143 mmol/L (ref 135–145)

## 2015-05-29 MED ORDER — GLYCOPYRROLATE 1 MG PO TABS
1.0000 mg | ORAL_TABLET | ORAL | Status: DC | PRN
  Filled 2015-05-29: qty 1

## 2015-05-29 MED ORDER — OXYCODONE HCL 5 MG PO TABS: 5.0000 mg | ORAL_TABLET | ORAL | Status: DC | PRN

## 2015-05-29 MED ORDER — ALBUMIN HUMAN 25 % IV SOLN
25.0000 g | Freq: Once | INTRAVENOUS | Status: AC
Start: 2015-05-29 — End: 2015-05-29
  Administered 2015-05-29: 25 g via INTRAVENOUS
  Filled 2015-05-29: qty 50

## 2015-05-29 MED ORDER — SODIUM CHLORIDE 0.9% FLUSH: 3.0000 mL | INTRAVENOUS | Status: DC | PRN

## 2015-05-29 MED ORDER — ONDANSETRON HCL 4 MG/2ML IJ SOLN: 4.0000 mg | Freq: Four times a day (QID) | INTRAMUSCULAR | Status: DC | PRN

## 2015-05-29 MED ORDER — HALOPERIDOL 0.5 MG PO TABS
0.5000 mg | ORAL_TABLET | ORAL | Status: DC | PRN
  Filled 2015-05-29: qty 1

## 2015-05-29 MED ORDER — HYDROMORPHONE BOLUS VIA INFUSION
0.5000 mg | INTRAVENOUS | Status: DC | PRN
  Filled 2015-05-29: qty 1

## 2015-05-29 MED ORDER — HALOPERIDOL LACTATE 5 MG/ML IJ SOLN: 0.5000 mg | INTRAMUSCULAR | Status: DC | PRN

## 2015-05-29 MED ORDER — GLYCOPYRROLATE 0.2 MG/ML IJ SOLN
0.2000 mg | INTRAMUSCULAR | Status: DC | PRN
  Filled 2015-05-29: qty 1

## 2015-05-29 MED ORDER — ACETAMINOPHEN 650 MG RE SUPP: 650.0000 mg | Freq: Four times a day (QID) | RECTAL | Status: DC | PRN

## 2015-05-29 MED ORDER — ACETAMINOPHEN 325 MG PO TABS: 650.0000 mg | ORAL_TABLET | Freq: Four times a day (QID) | ORAL | Status: DC | PRN

## 2015-05-29 MED ORDER — SODIUM CHLORIDE 0.9 % IV SOLN: 250.0000 mL | INTRAVENOUS | Status: DC | PRN

## 2015-05-29 MED ORDER — ONDANSETRON 4 MG PO TBDP: 4.0000 mg | ORAL_TABLET | Freq: Four times a day (QID) | ORAL | Status: DC | PRN

## 2015-05-29 MED ORDER — POLYVINYL ALCOHOL 1.4 % OP SOLN
1.0000 [drp] | Freq: Four times a day (QID) | OPHTHALMIC | Status: DC | PRN
  Filled 2015-05-29: qty 15

## 2015-05-29 MED ORDER — LORAZEPAM 2 MG/ML PO CONC: 1.0000 mg | ORAL | Status: DC | PRN

## 2015-05-29 MED ORDER — SODIUM CHLORIDE 0.9% FLUSH: 3.0000 mL | Freq: Two times a day (BID) | INTRAVENOUS | Status: DC

## 2015-05-29 MED ORDER — ALUM & MAG HYDROXIDE-SIMETH 200-200-20 MG/5ML PO SUSP
30.0000 mL | Freq: Four times a day (QID) | ORAL | Status: DC | PRN
Start: 2015-05-29 — End: 2015-06-01

## 2015-05-29 MED ORDER — BIOTENE DRY MOUTH MT LIQD: 15.0000 mL | OROMUCOSAL | Status: DC | PRN

## 2015-05-29 MED ORDER — HALOPERIDOL LACTATE 2 MG/ML PO CONC
0.5000 mg | ORAL | Status: DC | PRN
  Filled 2015-05-29: qty 0.3

## 2015-05-29 MED ORDER — LORAZEPAM 2 MG/ML IJ SOLN: 1.0000 mg | INTRAMUSCULAR | Status: DC | PRN

## 2015-05-29 MED ORDER — LORAZEPAM 1 MG PO TABS
1.0000 mg | ORAL_TABLET | ORAL | Status: DC | PRN
  Administered 2015-05-31: 1 mg via ORAL
  Filled 2015-05-29: qty 1

## 2015-05-29 NOTE — Progress Notes (Signed)
Physical Therapy Treatment Patient Details Name: Tamara Stephenson MRN: [id number] DOB: 28-Jan-1934 Today's Date: 05/29/2015    History of Present Illness 82 y.o. female with h/o DM type 2, cirrhosis of the liver, CHF, chronic kidney disease, HTN, carotid stenosis and chronic anemia, who presented to ED after being found on floor at ALF for unknown period of time. Pt was recently d/c from hospital 2 weeks ago after tx for PNA, lower extremity cellulitis and decubitus ulcers. Intubated for period of 5 days (3/15-3/20). CT Head 3/14 no acute intracranial abnormality seen.    PT Comments    Patient confused today. Pt tolerated squat pivot transfer with Max A of 2 due to reluctance placing weight through BLEs. Able to sit EOB unsupported. Chest tube insertion site saturated and RN notified. Continues to be appropriate for ST SNF.  Will follow acutely.  Follow Up Recommendations  SNF     Equipment Recommendations  None recommended by PT    Recommendations for Other Services       Precautions / Restrictions Precautions Precautions: Fall Precaution Comments: reports multiple falls at facility Restrictions Weight Bearing Restrictions: No    Mobility  Bed Mobility Overal bed mobility: Needs Assistance Bed Mobility: Supine to Sit     Supine to sit: Mod assist;+2 for physical assistance;HOB elevated     General bed mobility comments: Able to initiate bringing BLEs to EOB with max cues; assist with trunk and to scoot bottom to EOB.  Transfers Overall transfer level: Needs assistance Equipment used: 2 person hand held assist Transfers: Sit to/from [employer] Sit to Stand: Mod assist;+2 physical assistance Stand pivot transfers: Max assist;+2 physical assistance       General transfer comment: Max A of 2 squat pivot transfer to left with cues for technique. Pt reluctant to place weight through BLEs.   Ambulation/Gait                 Stairs             Wheelchair Mobility    Modified Rankin (Stroke Patients Only)       Balance Overall balance assessment: Needs assistance Sitting-balance support: Feet supported;No upper extremity supported Sitting balance-Leahy Scale: Fair     Standing balance support: During functional activity Standing balance-Leahy Scale: Zero Standing balance comment: Assist of 2 for standing but increased hip/trunk flexion.                    Cognition Arousal/Alertness: Awake/alert Behavior During Therapy: WFL for tasks assessed/performed Overall Cognitive Status: No family/caregiver present to determine baseline cognitive functioning Area of Impairment: Problem solving;Memory               General Comments: chart states h/o dementia; pt does not recall events leading up to admission or why she is in the hospital. Nonsensical speech at times.     Exercises      General Comments General comments (skin integrity, edema, etc.): Chest tube site saturated through dressing. RN notified and aware.       Pertinent Vitals/Pain Pain Assessment: Faces Pain Score: 8  Faces Pain Scale: Hurts even more Pain Location: BLEs with movement Pain Descriptors / Indicators: Grimacing;Moaning;Guarding Pain Intervention(s): Monitored during session;Repositioned;Limited activity within patient's tolerance    Home Living                      Prior Function            PT Goals (current  goals can now be found in the care plan section) Progress towards PT goals: Progressing toward goals    Frequency  Min 3X/week    PT Plan Current plan remains appropriate    Co-evaluation             End of Session Equipment Utilized During Treatment: Gait belt Activity Tolerance: Patient tolerated treatment well Patient left: in chair;with call bell/phone within reach;with chair alarm set     Time: 1133-1200 PT Time Calculation (min) (ACUTE ONLY): 27 min  Charges:  $Therapeutic Activity:  23-37 mins                    G Codes:      Doxie Augenstein A Leana [place]ston 05/29/2015, 1:01 PM Wray Kearns, [place], DPT [phone]

## 2015-05-29 NOTE — Progress Notes (Signed)
PMT RN: Pt is awake and appears to make conversation. However, after a few minutes, the conversation loops back on itself, and pt in unable to answer many questions. She does not remember information given to her a few minutes before. She does state that she completed a will and a power of attorney "in [place]" but does not have a copy of those records. She states she would want Ziggie to be her decision-maker. Will continue to monitor for better mental status. PMT feels that pt DOES NOT have capacity to make medical decisions.  Of note, pt states she does not want to go back to where she was living; she wants to go live near Ziggie. Case Manager is already involved in the pt's care. Unless pt has improvement in function and mental status, PMT feels that pt is NOT SAFE to go back to her home without 24 hour a day CG. Team will continue to follow, but not sure there is more to offer unless Ms Haspel becomes able to make decisions/discuss goals.  Larina Earthly, RN, BSN, [facility] 05/29/2015 1:44 PM Cell [phone] 8:00-4:00 Monday-Friday Office [phone]

## 2015-05-29 NOTE — Progress Notes (Signed)
Palliative Medicine Team RN: Discussed case with Lane Hacker, DO, who has spoken with Marzetta Board, MD. Both physicians states pt is not expected to have improvement in chest tube output due to hepatic hydrothorax (3.8L in 24 hours, which is actually a marked increase from Dr Golden Pop note of 700 ml in 24 hours prior to 3/23 at 0900), and end-stage complication of her liver disease; this output is while chest tube is to water seal, not wall suction. Her pain is poorly controlled with any and all movement and touching of her feet. Her mental status is highly variable; at her best she can have repetitive conversation. Earlier today, her IJ TLC had to be d/c because she picked at dressing and contaminated site; her poor mentation prevents adherence to medical treatments.  Initially admitted for sepsis. Cormorbids/secondaries: cirrhosis with ascites, DM, anemia, CKD, HCAP, hx dehydration, CHF, respiratory failure with hypoxia, demand ischemia.  3/24 Albumin is 1.4, H/H 8.9/27.9.  3-19 Ammonia 61 despite lactulose TID  Drs. Hilma Favors and [place] requested PMT RN contact friend Ziggie to discuss inpt hospice, as her symptoms are worsening and cannot be managed at home or in a facility due to chest tube. Attempted to contact Ziggie; left a message with the PMT contact information. Will continue to attempt contacts upon my return 3/27; will request SW continue attempts.  Larina Earthly, RN, BSN, [facility] 05/29/2015 3:36 PM Cell [phone] 8:00-4:00 Monday-Friday Office [phone]

## 2015-05-29 NOTE — Progress Notes (Signed)
Arrived to find that the Left sided TL-IJ is no longer intact due to the patient scratching & picking at the dressing. The transparent portion of the dressing that covers the insertion site has been completely removed. I asked the nurse to contact the MD to have the line removed due to patients increased risk for developing a CLABSI. Order received to remove central line and to start a PIV.  Central line removed. IV cathter intact. Vaseline pressure gauze to site, pressure held x 5 min, no bleeding to site and PIV started. [employer]

## 2015-05-29 NOTE — Progress Notes (Signed)
PULMONARY / CRITICAL CARE MEDICINE   Name: Tamara Stephenson MRN: [id number] DOB: 11/06/1933    ADMISSION DATE:  05/19/2015 CONSULTATION DATE:  05/20/2015  REFERRING MD:  [facility]  CHIEF COMPLAINT:  Short of breath  BRIEF:  82 yo female smoker from assisted living with fall.  She was confused, hypothermic, and had elevated lactic acid.  She was found to have large Rt pleural effusion.  She required intubation for acute hypoxic respiratory failure. She had recent tx for Pna, cellulitis, decubitus ulcers at [facility].She has hx of cirrhosis, DM, systolic/diastolic CHF, CKD.  STUDIES: TTE 3/17:  Mild LVH. EF 65-70%. No wall motion abnormality. Grade 1 diastolic dysfunction. LA mildly dilated & RA normal in size. RV normal in size & function. No aortic stenosis or regurg. Trivial mitral regurg w/o stenosis.No pulmonic stenosis. Trivial tricuspid regurg. No pericardial effusion.  Port CXR 3/18:  Patchy bilateral opacities worse in lower lung zones. ETT & chest tube in good position. R IJ in good position.  MICROBIOLOGY: Right Pleural Fluid AFB Ctx 3/15>>> Right Pleural Fluid Fungal Ctx 3/15>>> Right Pleural Fluid Ctx 3/15>>> negative  Tracheal Asp Ctx 3/15>>> negative MRSA PCR 3/15:  Negative Urine Strep Ag 3/15:  Negative Urine Legionella Ag 3/15:  Negative   Urine Ctx 3/14:  Negative  Blood Ctx x2 3/14:  Enterococcus 1/2  ANTIBIOTICS: Vancomycin 3/14 >>off Cefepime 3/14 >> off  LINES/TUBES: OETT 7.5 3/15>> R IJ CVL 3/15 >> R chest tube 47fr 3/15 >> Foley 3/15>> OGT 3/15>>out  SIGNIFICANT EVENTS: 3/14 Admit 3/14 CT head >> diffuse atrophy, chronic white matter ischemic changes 3/14 CT chest >> Rt pleural effusion with compressive ATX of Rt lung 3/14 EEG >> diffuse slowing 3/15 Rt arm doppler >> no DVT 3/15 Palliative care consult >> full code 3/20 extubate  SUBJECTIVE:   Denies chest pain or dyspnea Right chest tube is draining 3.3 L over last 24 hours  VITAL SIGNS: BP 115/48  mmHg  Pulse 79  Temp(Src) 98.2 F (36.8 C) (Oral)  Resp 20  Ht 5\' 6"  (1.676 m)  Wt 170 lb 6.7 oz (77.3 kg)  BMI 27.52 kg/m2  SpO2 94%  VENTILATOR SETTINGS:    INTAKE / OUTPUT: I/O last 3 completed shifts: In: -  Out: 4100 [Urine:800; Chest Tube:3300]  PHYSICAL EXAMINATION: General:  Elderly sickly appearing female Integument:  Wounds on b/l extremities covered by bandage, warm, red HEENT: Atraumatic, normocephalic, no discharge Cardiovascular: S1S2, rrr. No MRG Pulmonary expiratory wheezes, subcutaneous emphysema on the right, no rales, rhonchi or crackles noted. Abdomen: Soft ,nontender hypoactive bowel sounds Neurological: Awake, alert ,oriented answers appropriately to orientation question.  LABS:  PULMONARY No results for input(s): PHART, PCO2ART, PO2ART, HCO3, TCO2, O2SAT in the last 168 hours.  Invalid input(s): PCO2, PO2 CBC  Recent Labs Lab 05/27/15 0500 05/28/15 0432 05/29/15 0417  HGB 8.5* 8.7* 8.9*  HCT 25.9* 26.1* 27.9*  WBC 7.9 7.8 8.0  PLT 176 163 185   COAGULATION No results for input(s): INR in the last 168 hours. CARDIAC No results for input(s): TROPONINI in the last 168 hours. No results for input(s): PROBNP in the last 168 hours.  CHEMISTRY  Recent Labs Lab 05/23/15 0412 05/24/15 0345 05/25/15 0430 05/26/15 0600 05/27/15 0500 05/28/15 0432 05/29/15 0417  NA 148* 146* 148* 150* 147* 146* 143  K 3.5 3.6 3.6 3.7 3.7 4.3 4.3  CL 115* 118* 116* 119* 118* 115* 114*  CO2 24 23 24 22 23 24 23   GLUCOSE 161* 122*  115* 103* 176* 171* 262*  BUN 49* 53* 50* 52* 53* 46* 48*  CREATININE 1.56* 1.49* 1.51* 1.50* 1.55* 1.59* 1.73*  CALCIUM 9.1 9.1 9.3 9.3 9.0 8.9 8.9  MG 2.0 2.2 2.2 2.0 2.0  --   --   PHOS 3.6 3.4 3.3 3.3 2.3* 2.3* 3.7   Estimated Creatinine Clearance: 27.7 mL/min (by C-G formula based on Cr of 1.73).  LIVER  Recent Labs Lab 05/25/15 0430 05/26/15 0600 05/27/15 0500 05/28/15 0432 05/29/15 0417  ALBUMIN 1.4* 1.3*  1.3* 1.2* 1.4*   INFECTIOUS  Recent Labs Lab 05/23/15 0413  LATICACIDVEN 1.4   ENDOCRINE CBG (last 3)   Recent Labs  05/28/15 1617 05/28/15 2321 05/29/15 0645  GLUCAP 164* 296* 214*   IMAGING x48h US Abdomen Limited  05/29/2015  CLINICAL DATA:  Cirrhosis.  Ascites. EXAM: US ABDOMEN LIMITED - RIGHT UPPER QUADRANT COMPARISON:  CT on 12/25/2014 FINDINGS: Gallbladder: No gallstones or wall thickening visualized. No sonographic Murphy sign noted by sonographer. Gallbladder wall calcification is seen consistent with porcelain gallbladder, as demonstrated on recent CT, however no associated soft tissue mass identified. Common bile duct: Diameter: Suboptimally visualized due to hepatic cirrhosis, but measures approximately 4 mm. Liver: Diffuse coarsening of hepatic echotexture seen with diffuse capsular nodularity, consistent with hepatic cirrhosis. No liver mass visualized sonographically. Other:  Mild to moderate ascites noted in all 4 abdominal quadrants. IMPRESSION: Hepatic cirrhosis.  No liver mass visualized sonographically. Porcelain gallbladder noted, without evidence of gallstones or gallbladder mass. No evidence of biliary ductal dilatation. Mild to moderate ascites. Electronically Signed   By: Earle Gell M.D.   On: 05/29/2015 10:25   [place] Art/ven Flow Abd Pelv Doppler  05/29/2015  CLINICAL DATA:  Cirrhosis EXAM: DUPLEX ULTRASOUND OF LIVER TECHNIQUE: Color and duplex Doppler ultrasound was performed to evaluate the hepatic in-flow and out-flow vessels. COMPARISON:  None. FINDINGS: Exam was markedly limited by a lack of patient cooperation and breathing. Portal Vein Velocities The portal vein was noted to be patent by color Doppler imaging, with hepato pedal flow. Doppler analysis could not be performed due to technical limitations of the examination. The patient was very uncooperative. Hepatic Vein Velocities Right:  18.6 cm/sec Middle:  Not visualized Left:  33 cm/sec Hepatic Artery  Velocity:  Not visualized Splenic Vein Velocity:  15 cm/sec Varices: Absent Ascites: Present Right pleural effusion is noted.  The spleen is normal in size. IMPRESSION: The examination was markedly limited by lack of patient cooperation and breathing. The portal vein is patent by color Doppler imaging but velocities could not be obtained. The hepatic artery and middle hepatic vein also were not visualized likely due to above factors described. Right pleural effusion and ascites are present. Electronically Signed   By: Marybelle Killings M.D.   On: 05/29/2015 09:51   Dg Chest Port 1 View  05/29/2015  CLINICAL DATA:  Shortness of breath. EXAM: PORTABLE CHEST 1 VIEW COMPARISON:  05/28/2015. FINDINGS: Right chest tube in stable position. Right IJ line in stable position. Heart size stable. Persistent right base atelectasis and or infiltrate. Persistent right pleural effusion. Mild left base subsegmental atelectasis. Stable left pleural thickening. Right chest wall subcutaneous emphysema again noted . IMPRESSION: 1. Right chest tube in stable position. Right IJ line stable position. 2. Persistent small right apical pneumothorax. Persistent right chest wall subcutaneous emphysema. 3. Persistent right lower lobe atelectasis and or infiltrate without interim change. Persistent small right pleural effusion. 4. Mild left base subsegmental atelectasis. Mild left base  pleural thickening. Electronically Signed   By: Marcello Moores  Register   On: 05/29/2015 08:01   Dg Chest Port 1 View  05/28/2015  CLINICAL DATA:  Followup pneumothorax. EXAM: PORTABLE CHEST 1 VIEW COMPARISON:  Earlier film, same date. FINDINGS: The right IJ catheter is stable. Stable right basilar chest tube. There is a small persistent right apical pneumothorax and stable subcutaneous emphysema. The cardiac silhouette, mediastinal and hilar contours are stable and the lungs are unchanged. Persistent right basilar atelectasis and small effusion. IMPRESSION: Stable small  apical right-sided pneumothorax. Electronically Signed   By: Marijo Sanes M.D.   On: 05/28/2015 18:43   Dg Chest Port 1 View  05/28/2015  CLINICAL DATA:  Shortness of breath. EXAM: PORTABLE CHEST 1 VIEW COMPARISON:  05/25/2015. FINDINGS: Interim extubation and removal of NG tube. Right IJ line in stable position. Right chest tube in stable position with tip projected over the medial right costophrenic angle. Tiny right apical pneumothorax noted on today's exam. a right chest wall subcutaneous emphysema noted on today's exam. Heart size stable. Right base atelectasis and/or infiltrate again noted. IMPRESSION: 1. Interim extubation removal of NG tube. Right IJ line and right chest tube in stable position. New onset tiny right apical pneumothorax. New onset right chest wall subcutaneous emphysema. 2. Persistent right lower lobe atelectasis and/or infiltrate. Critical Value/emergent results were called by telephone at the time of interpretation on 05/28/2015 at 7:45 am to nurse Tiffany, who verbally acknowledged these results. Electronically Signed   By: Marcello Moores  Register   On: 05/28/2015 07:48   Dg Swallowing Func-speech Pathology  05/29/2015  Objective Swallowing Evaluation: Type of Study: MBS-Modified Barium Swallow Study Patient Details Name: Ayzha Prusha MRN: [id number] Date of Birth: 05/18/1933 Today's Date: 05/29/2015 Time: SLP Start Time (ACUTE ONLY): 1300-SLP Stop Time (ACUTE ONLY): 1330 SLP Time Calculation (min) (ACUTE ONLY): 30 min Past Medical History: Past Medical History Diagnosis Date . Back pain  . Hypertension  . Carotid stenosis  . Colitis  . Diabetes mellitus without complication ([place])  . Pneumonia  Past Surgical History: Past Surgical History Procedure Laterality Date . Carotid endarterectomy   . Lumbar laminectomy for epidural abscess N/A 10/16/2013   Procedure: LUMBAR LAMINECTOMY FOR EPIDURAL ABSCESS Lumbar Two through Four;  Surgeon: Charlie Pitter, MD;  Location: [facility];  Service:  Neurosurgery;  Laterality: N/A; . Hip arthroplasty Right 01/08/2015   Procedure: RIGHT ANTERIOR APPROACH HEMI HIP ARTHROPLASTY;  Surgeon: Rod Can, MD;  Location: [facility];  Service: Orthopedics;  Laterality: Right; HPI: 82 y.o. female with h/o DM type 2, cirrhosis of the liver, CHF, chronic kidney disease, HTN, carotid stenosis and chronic anemia, who presented to ED after being found on floor at ALF for unknown period of time. Pt was recently d/c from hospital 2 weeks ago after tx for PNA, lower extremity cellulitis and decubitus ulcers. Intubated for period of 5 days (3/15-3/20). CT Head 3/14 no acute intracranial abnormality seen. CXR 3/20 persistent bibasilar atelectasis/pneumonia with small pleural effusions. No prior h/o SLP intervention found in chart. Objective study recommended to assess swallow function and safety and identify least restrictive diet. Subjective: Pt seen in radiology for MBS. Pt pleasant and cooperative. Assessment / Plan / Recommendation CHL IP CLINICAL IMPRESSIONS 05/29/2015 Therapy Diagnosis Moderate pharyngeal phase dysphagia Clinical Impression Pt presents with mild-moderate sensorimotor based pharyngeal dysphagia. Swallow reflex is noted to be delayed on thin liquids, with trigger at the vallecular sinus via cup sip, and at the pyriform sinus via straw sips. Pt  tends to be very impulsive with liquids, which results in penetration before and during the swallow, and aspiration after the swallow of large consecutive boluses.  Small, individual cup sips were noted to be less delayed, and exhibited intermitent flash penetration. Puree and solid consistencies were tolerated without difficulty or delay. Pt appears safe for regular solids and thin liquids WITH FULL SUPERVISION for small individual sips. If supervision cannot be provided, nectar thick liquids are recommended. Pt is VERY IMPULSIVE with liquids and will aspirate large consecutive boluses. Impact on safety and function  Moderate aspiration risk if safe swallow precautions are not followed   CHL IP TREATMENT RECOMMENDATION 05/29/2015 Treatment Recommendations Therapy as outlined in treatment plan below   Prognosis 05/29/2015 Prognosis for Safe Diet Advancement Good Barriers to Reach Goals -- Barriers/Prognosis Comment -- CHL IP DIET RECOMMENDATION 05/29/2015 SLP Diet Recommendations Nectar thick liquid;Thin liquid Liquid Administration via Cup;No straw Medication Administration Whole meds with puree Compensations Minimize environmental distractions;Slow rate;Small sips/bites Postural Changes --   CHL IP OTHER RECOMMENDATIONS 05/29/2015 Recommended Consults -- Oral Care Recommendations Oral care QID Other Recommendations --   CHL IP FOLLOW UP RECOMMENDATIONS 05/29/2015 Follow up Recommendations (No Data)   CHL IP FREQUENCY AND DURATION 05/29/2015 Speech Therapy Frequency (ACUTE ONLY) min 2x/week Treatment Duration 2 weeks      CHL IP ORAL PHASE 05/29/2015 Oral Phase WFL Oral - Pudding Teaspoon -- Oral - Pudding Cup -- Oral - Honey Teaspoon -- Oral - Honey Cup -- Oral - Nectar Teaspoon -- Oral - Nectar Cup -- Oral - Nectar Straw -- Oral - Thin Teaspoon -- Oral - Thin Cup -- Oral - Thin Straw -- Oral - Puree -- Oral - Mech Soft -- Oral - Regular -- Oral - Multi-Consistency -- Oral - Pill -- Oral Phase - Comment --  CHL IP PHARYNGEAL PHASE 05/29/2015 Pharyngeal Phase Impaired Pharyngeal- Pudding Teaspoon -- Pharyngeal -- Pharyngeal- Pudding Cup -- Pharyngeal -- Pharyngeal- Honey Teaspoon -- Pharyngeal -- Pharyngeal- Honey Cup -- Pharyngeal -- Pharyngeal- Nectar Teaspoon -- Pharyngeal -- Pharyngeal- Nectar Cup -- Pharyngeal -- Pharyngeal- Nectar Straw -- Pharyngeal -- Pharyngeal- Thin Teaspoon -- Pharyngeal -- Pharyngeal- Thin Cup Delayed swallow initiation-vallecula;Reduced airway/laryngeal closure;Penetration/Aspiration before swallow;Penetration/Aspiration during swallow;Penetration/Apiration after swallow;Trace aspiration Pharyngeal Material  does not enter airway;Material enters airway, remains ABOVE vocal cords then ejected out;Material enters airway, CONTACTS cords and not ejected out Pharyngeal- Thin Straw Delayed swallow initiation-pyriform sinuses;Penetration/Aspiration before swallow;Penetration/Apiration after swallow;Penetration/Aspiration during swallow;Reduced airway/laryngeal closure;Trace aspiration Pharyngeal Material enters airway, CONTACTS cords and not ejected out Pharyngeal- Puree WFL Pharyngeal -- Pharyngeal- Mechanical Soft -- Pharyngeal -- Pharyngeal- Regular -- Pharyngeal -- Pharyngeal- Multi-consistency WFL Pharyngeal -- Pharyngeal- Pill -- Pharyngeal -- Pharyngeal Comment aspiration of thin liquids was noted on large, consecutive boluses. Trace flash penetration noted with small individual cup sips  CHL IP CERVICAL ESOPHAGEAL PHASE 05/29/2015 Cervical Esophageal Phase WFL Pudding Teaspoon -- Pudding Cup -- Honey Teaspoon -- Honey Cup -- Nectar Teaspoon -- Nectar Cup -- Nectar Straw -- Thin Teaspoon -- Thin Cup -- Thin Straw -- Puree -- Mechanical Soft -- Regular -- Multi-consistency -- Pill -- Cervical Esophageal Comment -- No flowsheet data found. Shonna Chock 05/29/2015, 2:52 PM  Celia B. Quentin Ore [facility], [place] [phone]              ASSESSMENT / PLAN:  A: Acute Hypoxic Respiratory Failure - Secondary to HCAP. HCAP Right Pleural Effusion - S/P Chest Tube 3/15, transudate-may be due to hepatic hydrothorax/CHF Tobacco Use  P:  DNR.status Encourage Ambulation as tolerated Chest tube to water seal, output  Still remains high Once goals of care clarified, would recommend discontinue chest tube   A:   Constipation  Liver Cirrhosis  P:   continue lactulose to PO. Dysphagia 3 diet per speech. Consider adding diuretic such as Aldactone   INFECTIOUS A:   HCAP  Parapneumonic Right Effusion Enterococcus Bacteremia - TTE negative. Positive blood culture with enterococcus 1/2 bottles P:    Id  recommendation against treatment only positive in 1 out of 2 bottles Follow cultures to completion.  Kara Mead MD. Shade Flood. [facility] Pager [phone] If no response call [phone]     05/29/2015 4:37 PM

## 2015-05-29 NOTE — Consult Note (Signed)
MBSS complete. Pt appears safe for regular solids and thin liquids WITH 1:1 SUPERVISION and cues to limit bolus size and rate. If supervision cannot be provided, recommend Nectar thick liquids.  Full report located under chart review in imaging section.  Celia B. Quentin Ore [facility], [place] [phone]

## 2015-05-29 NOTE — Progress Notes (Signed)
PROGRESS NOTE  Tamara Stephenson [id number] DOB: 04-10-1933 DOA: 05/19/2015 PCP: Curly Rim, MD Outpatient Specialists:    LOS: 10 days   Brief Narrative: 82 yo F with history of DM, liver cirrhosis, CHF, CKD, admitted on 3/15 with hypothermia, elevated lactic acid and acute encephalopathy, felt to be septic.   Assessment & Plan: Principal Problem:   Sepsis ([place]) Active Problems:   CKD (chronic kidney disease) stage 3, GFR 30-59 ml/min   Anemia of chronic disease   Diabetes mellitus with renal manifestations, uncontrolled (HCC)   Cirrhosis of liver with ascites (HCC)   HCAP (healthcare-associated pneumonia)   Dehydration   CHF (congestive heart failure) (HCC)   Pressure ulcer   Acute encephalopathy   Pleural effusion   Acute respiratory failure with hypoxia ([place])   Demand ischemia (HCC)   Encounter for central line placement   Respiratory failure ([place])   Chest tube in place   Pleural effusion, right   Palliative care encounter   Goals of care, counseling/discussion   Acute hypoxic respiratory failure due to HCAP and right sided pleural effusion - right pleural effusion s/p chest tubs placement 3/15. PCCM managing. Significant output still, wonder if this is related a component of hepatic hydrothorax and will just re-accumulate  - was on vent until 3/20, extubated, stable without significant oxygen requirements - finished 8 days for HCAP coverage on 3/22 - chest tube with significant output in the past 24 hours, 3.8L. No optimal solution here, she has cirrhosis and ascites, suspect chest tube will continue with significant output. She has a component of hepatic encephalopathy with elevated ammonia levels and TIPS may make it far worse so I doubt that this would benefit patient. TCV vs palliative drain may be options however she may not be the best surgical candidate at this point. Discussed with Dr. Chase Caller and with Dr. Hilma Favors with palliative care.  Apical pneumothorax on  right - clinically stable, no hypoxia / hypotension - management per PCCM - repeat CXR today  AKI on CKD III-IV - renal function slowly worsening, ?hepatorenal - will give albumin today since she is losing significant amounts of pleural / ascitic fluid  Liver cirrhosis - decompensated with hepatic encephalopathy, ascites - continue Lactulose - continue to hold Lasix as she appears intravascularly depleted. Albumin today   DM - last A1C 6.6 in 12/2014 - continue SSI  Hypernatremia - improving  Anemia of critical illness - stable, no bleeding  Acute encephalopathy - mental status fluctuates, more confused today  HTN - hold home antihypertensives  Hypotension - thought to be due to sedation  Positive blood culture with enterococcus 1/2 bottles - ID consulted, recommended against treatment as it was only 1/2 bottles and significance is unclear.   Chronic combined CHF - repeat echo 05/2015 with recovery of her EF, now 65-70%, grade 1 dd   DVT prophylaxis: heparin Code Status: DNR Family Communication: no family bedside Disposition Plan: TBD Barriers for discharge: chest tube with significant output  Consultants:   PCCM  ID  Palliative care  STUDIES: TTE 3/17: Mild LVH. EF 65-70%. No wall motion abnormality. Grade 1 diastolic dysfunction. LA mildly dilated & RA normal in size. RV normal in size & function. No aortic stenosis or regurg. Trivial mitral regurg w/o stenosis.No pulmonic stenosis. Trivial tricuspid regurg. No pericardial effusion.  Port CXR 3/18: Patchy bilateral opacities worse in lower lung zones. ETT & chest tube in good position. R IJ in good position. MICROBIOLOGY: Right Pleural Fluid AFB Ctx 3/15>>>  Right Pleural Fluid Fungal Ctx 3/15>>> Right Pleural Fluid Ctx 3/15>>> negative  Tracheal Asp Ctx 3/15>>> negative MRSA PCR 3/15: Negative Urine Strep Ag 3/15: Negative Urine Legionella Ag 3/15: Negative  Urine Ctx 3/14: Negative   Blood Ctx x2 3/14: Enterococcus 1/2 ANTIBIOTICS: Vancomycin 3/14 >> Cefepime 3/14 >>  LINES/TUBES:  OETT 7.5 3/15>>  R IJ CVL 3/15 >>  R chest tube 20fr 3/15 >>  Foley 3/15>>  OGT 3/15>>  SIGNIFICANT EVENTS:  3/14 Admit  3/14 CT head >> diffuse atrophy, chronic white matter ischemic changes  3/14 CT chest >> Rt pleural effusion with compressive ATX of Rt lung  3/14 EEG >> diffuse slowing  3/15 Rt arm doppler >> no DVT  3/15 Palliative care consult >> full code  3/20 extubate 3/22 TRH took over  Antimicrobials:  Vancomycin 3/15 >>  Cefepime 3/15 >>   Subjective: - confused, complains of pain bilateral feet where boots are on. Relieved by loosening the boots  Objective: Filed Vitals:   05/28/15 2000 05/28/15 2300 05/29/15 0403 05/29/15 0500  BP: 108/44  96/42   Pulse: 81  80   Temp: 97.7 F (36.5 C)  97.7 F (36.5 C)   TempSrc: Oral  Oral   Resp: 18  18   Height:      Weight:   77.3 kg (170 lb 6.7 oz)   SpO2: 97% 100% 96% 95%    Intake/Output Summary (Last 24 hours) at 05/29/15 1040 Last data filed at 05/29/15 0500  Gross per 24 hour  Intake      0 ml  Output   1850 ml  Net  -1850 ml   Filed Weights   05/27/15 0436 05/28/15 0459 05/29/15 0403  Weight: 81 kg (178 lb 9.2 oz) 75.7 kg (166 lb 14.2 oz) 77.3 kg (170 lb 6.7 oz)    Examination: BP 96/42 mmHg  Pulse 80  Temp(Src) 97.7 F (36.5 C) (Oral)  Resp 18  Ht 5\' 6"  (1.676 m)  Wt 77.3 kg (170 lb 6.7 oz)  BMI 27.52 kg/m2  SpO2 95%  GENERAL: NAD  HEENT: head NCAT, no scleral icterus. Pupils round and reactive.   NECK: Supple. No carotid bruits. No lymphadenopathy or thyromegaly.  LUNGS: Clear to auscultation. No wheezing or crackles. Chest tube in place  HEART: Regular rate and rhythm without murmur. 2+ pulses, no JVD, no peripheral edema  ABDOMEN: Soft, nontender, and nondistended. Positive bowel sounds.   EXTREMITIES: Without any cyanosis or clubbing  NEUROLOGIC: non focal    Data  Reviewed: I have personally reviewed following labs and imaging studies  CBC:  Recent Labs Lab 05/25/15 0430 05/26/15 0600 05/27/15 0500 05/28/15 0432 05/29/15 0417  WBC 6.9 6.9 7.9 7.8 8.0  NEUTROABS 3.3 3.2 3.6 3.1 3.9  HGB 8.6* 8.4* 8.5* 8.7* 8.9*  HCT 27.1* 26.8* 25.9* 26.1* 27.9*  MCV 100.4* 100.8* 101.2* 102.0* 100.4*  PLT 173 170 176 163 [id number]   Basic Metabolic Panel:  Recent Labs Lab 05/23/15 0412 05/24/15 0345 05/25/15 0430 05/26/15 0600 05/27/15 0500 05/28/15 0432 05/29/15 0417  NA 148* 146* 148* 150* 147* 146* 143  K 3.5 3.6 3.6 3.7 3.7 4.3 4.3  CL 115* 118* 116* 119* 118* 115* 114*  CO2 24 23 24 22 23 24 23   GLUCOSE 161* 122* 115* 103* 176* 171* 262*  BUN 49* 53* 50* 52* 53* 46* 48*  CREATININE 1.56* 1.49* 1.51* 1.50* 1.55* 1.59* 1.73*  CALCIUM 9.1 9.1 9.3 9.3 9.0 8.9 8.9  MG 2.0  2.2 2.2 2.0 2.0  --   --   PHOS 3.6 3.4 3.3 3.3 2.3* 2.3* 3.7   GFR: Estimated Creatinine Clearance: 27.7 mL/min (by C-G formula based on Cr of 1.73). Liver Function Tests:  Recent Labs Lab 05/25/15 0430 05/26/15 0600 05/27/15 0500 05/28/15 0432 05/29/15 0417  ALBUMIN 1.4* 1.3* 1.3* 1.2* 1.4*   No results for input(s): LIPASE, AMYLASE in the last 168 hours.  Recent Labs Lab 05/24/15 1145  AMMONIA 61*   Cardiac Enzymes: No results for input(s): CKTOTAL, CKMB, CKMBINDEX, TROPONINI in the last 168 hours. CBG:  Recent Labs Lab 05/28/15 0648 05/28/15 1109 05/28/15 1617 05/28/15 2321 05/29/15 0645  GLUCAP 140* 206* 164* 296* 214*   Urine analysis:    Component Value Date/Time   COLORURINE YELLOW 05/19/2015 2022   APPEARANCEUR CLEAR 05/19/2015 2022   LABSPEC 1.015 05/19/2015 2022   PHURINE 5.5 05/19/2015 2022   GLUCOSEU NEGATIVE 05/19/2015 2022   HGBUR NEGATIVE 05/19/2015 2022   BILIRUBINUR NEGATIVE 05/19/2015 2022   KETONESUR NEGATIVE 05/19/2015 2022   PROTEINUR NEGATIVE 05/19/2015 2022   UROBILINOGEN 1.0 01/06/2015 1225   NITRITE NEGATIVE 05/19/2015  2022   LEUKOCYTESUR NEGATIVE 05/19/2015 2022   Sepsis Labs: Invalid input(s): PROCALCITONIN, LACTICIDVEN  Recent Results (from the past 240 hour(s))  Blood Culture (routine x 2)     Status: None   Collection Time: 05/19/15  5:34 PM  Result Value Ref Range Status   Specimen Description BLOOD RIGHT EJ  Final   Special Requests BOTTLES DRAWN AEROBIC ONLY [place]  Final   Culture  Setup Time   Final    GRAM POSITIVE COCCI IN PAIRS IN CLUSTERS AEROBIC BOTTLE ONLY CRITICAL RESULT CALLED TO, READ BACK BY AND VERIFIED WITH: A. AGUIRRE,RN AT [id number] ON [id number] BY Rhea Bleacher    Culture   Final    ENTEROCOCCUS SPECIES STAPHYLOCOCCUS SPECIES (COAGULASE NEGATIVE) THE SIGNIFICANCE OF ISOLATING THIS ORGANISM FROM A SINGLE SET OF BLOOD CULTURES WHEN MULTIPLE SETS ARE DRAWN IS UNCERTAIN. PLEASE NOTIFY THE MICROBIOLOGY DEPARTMENT WITHIN ONE WEEK IF SPECIATION AND SENSITIVITIES ARE REQUIRED.    Report Status 05/22/2015 FINAL  Final   Organism ID, Bacteria ENTEROCOCCUS SPECIES  Final      Susceptibility   Enterococcus species - MIC*    AMPICILLIN <=2 SENSITIVE Sensitive     VANCOMYCIN 1 SENSITIVE Sensitive     GENTAMICIN SYNERGY SENSITIVE Sensitive     * ENTEROCOCCUS SPECIES  Urine culture     Status: None   Collection Time: 05/19/15  8:22 PM  Result Value Ref Range Status   Specimen Description URINE, CATHETERIZED  Final   Special Requests NONE  Final   Culture NO GROWTH 2 DAYS  Final   Report Status 05/21/2015 FINAL  Final  Blood Culture (routine x 2)     Status: None   Collection Time: 05/19/15  8:45 PM  Result Value Ref Range Status   Specimen Description BLOOD LEFT ANTECUBITAL  Final   Special Requests BOTTLES DRAWN AEROBIC AND ANAEROBIC 5CC  Final   Culture NO GROWTH 5 DAYS  Final   Report Status 05/24/2015 FINAL  Final  MRSA PCR Screening     Status: None   Collection Time: 05/20/15  3:42 PM  Result Value Ref Range Status   MRSA by PCR NEGATIVE NEGATIVE Final    Comment:        The  GeneXpert MRSA Assay (FDA approved for NASAL specimens only), is one component of a comprehensive MRSA colonization surveillance program. It  is not intended to diagnose MRSA infection nor to guide or monitor treatment for MRSA infections.   Culture, respiratory (tracheal aspirate)     Status: None   Collection Time: 05/20/15  6:11 PM  Result Value Ref Range Status   Specimen Description TRACHEAL ASPIRATE  Final   Special Requests Normal  Final   Gram Stain   Final    MODERATE WBC PRESENT, PREDOMINANTLY PMN NO SQUAMOUS EPITHELIAL CELLS SEEN NO ORGANISMS SEEN Performed at [facility]    Culture   Final    NO GROWTH 2 DAYS Performed at [facility]    Report Status 05/23/2015 FINAL  Final  Acid Fast Smear (AFB)     Status: None   Collection Time: 05/21/15  8:30 AM  Result Value Ref Range Status   AFB Specimen Processing Concentration  Final   Acid Fast Smear Negative  Final    Comment: (NOTE) Performed At: [facility] [address] [place], [place] [id number] Lindon Romp MD [id number]    Source (AFB) FLUID  Final    Comment: PLEURAL  Fungus Culture With Stain (Not @ [facility])     Status: None (Preliminary result)   Collection Time: 05/21/15  8:30 AM  Result Value Ref Range Status   Fungus Stain Final report  Final    Comment: (NOTE) Performed At: [facility] [place], [place] [id number] Lindon Romp MD [id number]    Fungus (Mycology) Culture PENDING  Incomplete   Fungal Source FLUID  Final    Comment: PLEURAL  Body fluid culture     Status: None   Collection Time: 05/21/15  8:30 AM  Result Value Ref Range Status   Specimen Description FLUID PLEURAL  Final   Special Requests NONE  Final   Gram Stain   Final    MODERATE WBC PRESENT,BOTH PMN AND MONONUCLEAR NO ORGANISMS SEEN    Culture NO GROWTH 3 DAYS  Final   Report Status 05/24/2015 FINAL  Final  Fungus Culture Result     Status: None   Collection  Time: 05/21/15  8:30 AM  Result Value Ref Range Status   Result 1 Comment  Final    Comment: (NOTE) KOH/Calcofluor preparation:  no fungus observed. Performed At: [facility] [place], [place] [id number] Lindon Romp MD [id number]     Radiology Studies: US Abdomen Limited  05/29/2015  CLINICAL DATA:  Cirrhosis.  Ascites. EXAM: US ABDOMEN LIMITED - RIGHT UPPER QUADRANT COMPARISON:  CT on 12/25/2014 FINDINGS: Gallbladder: No gallstones or wall thickening visualized. No sonographic Murphy sign noted by sonographer. Gallbladder wall calcification is seen consistent with porcelain gallbladder, as demonstrated on recent CT, however no associated soft tissue mass identified. Common bile duct: Diameter: Suboptimally visualized due to hepatic cirrhosis, but measures approximately 4 mm. Liver: Diffuse coarsening of hepatic echotexture seen with diffuse capsular nodularity, consistent with hepatic cirrhosis. No liver mass visualized sonographically. Other:  Mild to moderate ascites noted in all 4 abdominal quadrants. IMPRESSION: Hepatic cirrhosis.  No liver mass visualized sonographically. Porcelain gallbladder noted, without evidence of gallstones or gallbladder mass. No evidence of biliary ductal dilatation. Mild to moderate ascites. Electronically Signed   By: Earle Gell M.D.   On: 05/29/2015 10:25   [place] Art/ven Flow Abd Pelv Doppler  05/29/2015  CLINICAL DATA:  Cirrhosis EXAM: DUPLEX ULTRASOUND OF LIVER TECHNIQUE: Color and duplex Doppler ultrasound was performed to evaluate the hepatic in-flow and out-flow vessels. COMPARISON:  None. FINDINGS:  Exam was markedly limited by a lack of patient cooperation and breathing. Portal Vein Velocities The portal vein was noted to be patent by color Doppler imaging, with hepato pedal flow. Doppler analysis could not be performed due to technical limitations of the examination. The patient was very uncooperative. Hepatic Vein Velocities  Right:  18.6 cm/sec Middle:  Not visualized Left:  33 cm/sec Hepatic Artery Velocity:  Not visualized Splenic Vein Velocity:  15 cm/sec Varices: Absent Ascites: Present Right pleural effusion is noted.  The spleen is normal in size. IMPRESSION: The examination was markedly limited by lack of patient cooperation and breathing. The portal vein is patent by color Doppler imaging but velocities could not be obtained. The hepatic artery and middle hepatic vein also were not visualized likely due to above factors described. Right pleural effusion and ascites are present. Electronically Signed   By: Marybelle Killings M.D.   On: 05/29/2015 09:51   Dg Chest Port 1 View  05/29/2015  CLINICAL DATA:  Shortness of breath. EXAM: PORTABLE CHEST 1 VIEW COMPARISON:  05/28/2015. FINDINGS: Right chest tube in stable position. Right IJ line in stable position. Heart size stable. Persistent right base atelectasis and or infiltrate. Persistent right pleural effusion. Mild left base subsegmental atelectasis. Stable left pleural thickening. Right chest wall subcutaneous emphysema again noted . IMPRESSION: 1. Right chest tube in stable position. Right IJ line stable position. 2. Persistent small right apical pneumothorax. Persistent right chest wall subcutaneous emphysema. 3. Persistent right lower lobe atelectasis and or infiltrate without interim change. Persistent small right pleural effusion. 4. Mild left base subsegmental atelectasis. Mild left base pleural thickening. Electronically Signed   By: Marcello Moores  Register   On: 05/29/2015 08:01   Dg Chest Port 1 View  05/28/2015  CLINICAL DATA:  Followup pneumothorax. EXAM: PORTABLE CHEST 1 VIEW COMPARISON:  Earlier film, same date. FINDINGS: The right IJ catheter is stable. Stable right basilar chest tube. There is a small persistent right apical pneumothorax and stable subcutaneous emphysema. The cardiac silhouette, mediastinal and hilar contours are stable and the lungs are unchanged.  Persistent right basilar atelectasis and small effusion. IMPRESSION: Stable small apical right-sided pneumothorax. Electronically Signed   By: Marijo Sanes M.D.   On: 05/28/2015 18:43   Dg Chest Port 1 View  05/28/2015  CLINICAL DATA:  Shortness of breath. EXAM: PORTABLE CHEST 1 VIEW COMPARISON:  05/25/2015. FINDINGS: Interim extubation and removal of NG tube. Right IJ line in stable position. Right chest tube in stable position with tip projected over the medial right costophrenic angle. Tiny right apical pneumothorax noted on today's exam. a right chest wall subcutaneous emphysema noted on today's exam. Heart size stable. Right base atelectasis and/or infiltrate again noted. IMPRESSION: 1. Interim extubation removal of NG tube. Right IJ line and right chest tube in stable position. New onset tiny right apical pneumothorax. New onset right chest wall subcutaneous emphysema. 2. Persistent right lower lobe atelectasis and/or infiltrate. Critical Value/emergent results were called by telephone at the time of interpretation on 05/28/2015 at 7:45 am to nurse Tiffany, who verbally acknowledged these results. Electronically Signed   By: Marcello Moores  Register   On: 05/28/2015 07:48    Scheduled Meds: . antiseptic oral rinse  7 mL Mouth Rinse q12n4p  . aspirin  81 mg Per Tube Daily  . heparin subcutaneous  5,000 Units Subcutaneous 3 times per day  . insulin aspart  0-15 Units Subcutaneous TID WC  . lactulose  20 g Oral TID  . pantoprazole  sodium  40 mg Per Tube Q24H  . sodium chloride flush  10-40 mL Intracatheter Q12H   Continuous Infusions: . sodium chloride 10 mL/hr at 05/21/15 1000    Marzetta Board, MD, PhD [facility] Pager [phone]  If 7PM-7AM, please contact night-coverage [email or web address] Password [email or web address] 05/29/2015, 10:40 AM

## 2015-05-29 NOTE — Care Management Important Message (Signed)
Important Message  Patient Details  Name: Tamara Stephenson MRN: [id number] Date of Birth: February 16, 1934   Medicare Important Message Given:  Yes    Dawayne Patricia, RN 05/29/2015, 5:03 PM

## 2015-05-29 NOTE — Progress Notes (Signed)
Palliative Medicine Follow Up:  Rec'd call back from Tamara Stephenson. Extensive discussion with her about physicians' recommendations and the patient's status. Explored the patient's history of not following MD recommendations, her history of alcohol abuse, and her preferences to run her life her way. In the setting of the patient's strong will and independence as well as the burden of care associated with continued hospital treatment, Tamara Stephenson would like for Tamara Stephenson to be referred and transferred to inpt hospice, as her symptoms, especially her chest tube, require frequent RN intervention.   Spoke with Dr Hilma Favors and Dr Cruzita Lederer. New orders placed for comfort care and end of life symptom management, as well as hospice placement. Spoke with SW Eliezer Lofts to notify her of decisions and plan. If further questions come up this weekend, please contact attending or PMT on call at [phone].  Larina Earthly, RN, BSN, [facility] 05/29/2015 5:31 PM Cell [phone] 8:00-4:00 Monday-Friday Office [phone]  Patient has end stage liver disease and multiple co-morbidities- her chest tube will likely continue to have large volume output due to hepatic hydrothorax-this is not a reversible condition. She is encephalopathic and has limited support.  Initial palliative consult done 3/15 and she has minimal improvement. At this point a transition to comfort care and hospice facility management of respiratory distress and comfort would be the most reasonable approach to her terminal illness at this point. Prognosis once she transitions to full comfort and her chest tube is removed is <2 weeks. Palliative PRNs ordered for her comfort and we will discontinue any and all non-essential medications and meds not related to comfort. PPS: 30%  Time: 6PM-635PM Total Time 35 minutes Greater than 50%  of this time was spent counseling and coordinating care related to the above assessment and plan.  Lane Hacker, DO Palliative  Medicine [phone]

## 2015-05-29 NOTE — Progress Notes (Signed)
Inpatient Diabetes Program Recommendations  AACE/ADA: New Consensus Statement on Inpatient Glycemic Control (2015)  Target Ranges:  Prepandial:   less than 140 mg/dL      Peak postprandial:   less than 180 mg/dL (1-2 hours)      Critically ill patients:  140 - 180 mg/dL   Review of Glycemic Control:  Results for Tamara Stephenson, Tamara Stephenson (MRN [id number]) as of 05/29/2015 08:58  Ref. Range 05/28/2015 06:48 05/28/2015 11:09 05/28/2015 16:17 05/28/2015 23:21 05/29/2015 06:45  Glucose-Capillary Latest Ref Range: 65-99 mg/dL 140 (H) 206 (H) 164 (H) 296 (H) 214 (H)   Diabetes history: Type 2 diabetes Outpatient Diabetes medications: Levemir 7 units bid Current orders for Inpatient glycemic control:  Novolog moderate tid with meals  Inpatient Diabetes Program Recommendations: Please consider restarting a portion of patient's home basal insulin Levemir 5 units bid.  Thanks, Adah Perl, RN, BC-ADM Inpatient Diabetes Coordinator Pager [phone] (8a-5p)

## 2015-05-29 NOTE — Progress Notes (Signed)
Speech Language Pathology Treatment: Dysphagia  Patient Details Name: Tamara Stephenson MRN: [id number] DOB: [birth year] Today's Date: 05/29/2015 Time: [id number]    Assessment / Plan / Recommendation Clinical Impression  Pt observed during breakfast. Impulsivity noted. No overt difficulty with dys 3, nectar thick, or thin liquid trials. Given recent admission for PNA and 5 day intubation, recommend MBS to objectively assess swallow function and safety and identify least restrictive diet.   HPI HPI: 82 y.o. female with h/o DM type 2, cirrhosis of the liver, CHF, chronic kidney disease, HTN, carotid stenosis and chronic anemia, who presented to ED after being found on floor at ALF for unknown period of time. Pt was recently d/c from hospital 2 weeks ago after tx for PNA, lower extremity cellulitis and decubitus ulcers. Intubated for period of 5 days (3/15-3/20). CT Head 3/14 no acute intracranial abnormality seen. CXR 3/20 persistent bibasilar atelectasis/pneumonia with small pleural effusions. No prior h/o SLP intervention found in chart.       SLP Plan    MBS    Recommendations  Diet recommendations: Dysphagia 3 (mechanical soft);Nectar-thick liquid Liquids provided via: Cup;Straw Medication Administration: Whole meds with puree Supervision: Staff to assist with self feeding;Full supervision/cueing for compensatory strategies;Patient able to self feed Compensations: Minimize environmental distractions;Slow rate;Small sips/bites Postural Changes and/or Swallow Maneuvers: Seated upright 90 degrees;Upright 30-60 min after meal             Oral Care Recommendations: Oral care BID     Shonna Chock 05/29/2015, 11:21 AM  Early Chars B. Qu[place] [facility], [place] [phone]

## 2015-05-29 NOTE — Progress Notes (Addendum)
5pm Melanie with palliative able to speak with Ziggy- plan is for residential hospice  CSW made referral to [facility], [facility], [facility], [facility]  3:40pm CSW received call from palliative to disucss pt prognosis- informed pt likely appropriate for residential hospice and palliative team trying to contact Ziggy to discuss and get permission to move forward with hospice placement.  CSW already attempted to contact Ziggy today and left message- CSW left hand off requesting weekend CSW assistance in contacting Ziggy and helping connect her with Palliative medicine team  2pm Pt had chosen [facility] for rehab yesterday and facility is following for admission.  Per MD unlikely for pt to DC over weekend due to high output chest tube  [facility] has attempted to complete paperwork with pt multiple times without luck- pt has been confused in hospital and Palliative physician is unsure of pt capacity to make decisions at this time  CSW spoke with Ziggy who pt was trying to get HCPOA before admission- Ziggy confirms that she is willing to help with decisions and paperwork while pt is hospital but is having some personal issues (husband in SNF for stroke) and is unable to get to Secretary to complete paperwork with pt  CSW discussed possible solutions (fax, email, etc for paperwork) but Ziggy does not have a computer and does not think she can get paperwork faxed to any local businesses- states she will explore solutions but does state pt has another friend in [place] who might be able to assist Carmelina Paddock)- CSW attempted to call but voicemail stated it was another person so CSW did not leave a message.  CSW will continue to follow  Domenica Reamer, [place] Social Worker [phone]

## 2015-05-30 NOTE — Progress Notes (Signed)
PROGRESS NOTE  Tamara Stephenson [id number] DOB: [birth year] DOA: 05/19/2015 PCP: Curly Rim, MD Outpatient Specialists:    LOS: 11 days   Brief Narrative: 82 yo F with history of DM, liver cirrhosis, CHF, CKD, admitted on 3/15 with hypothermia, elevated lactic acid and acute encephalopathy, felt to be septic.   Assessment & Plan: Principal Problem:   Sepsis ([place]) Active Problems:   CKD (chronic kidney disease) stage 3, GFR 30-59 ml/min   Anemia of chronic disease   Diabetes mellitus with renal manifestations, uncontrolled (HCC)   Cirrhosis of liver with ascites (HCC)   HCAP (healthcare-associated pneumonia)   Dehydration   CHF (congestive heart failure) (HCC)   Pressure ulcer   Acute encephalopathy   Pleural effusion   Acute respiratory failure with hypoxia ([place])   Demand ischemia (HCC)   Encounter for central line placement   Respiratory failure ([place])   Chest tube in place   Pleural effusion, right   Palliative care encounter   Goals of care, counseling/discussion   Cirrhosis ([place])   Acute hypoxic respiratory failure due to HCAP and right sided pleural effusion - right pleural effusion s/p chest tubs placement 3/15. PCCM managing. Significant output still, wonder if this is related a component of hepatic hydrothorax and will just re-accumulate  - was on vent until 3/20, extubated, stable without significant oxygen requirements - finished 8 days for HCAP coverage on 3/22 - chest tube with significant outputstill. No optimal solution here, she has cirrhosis and ascites, suspect chest tube will continue with significant output. She has a component of hepatic encephalopathy with elevated ammonia levels and TIPS may make it far worse so I doubt that this would benefit patient.   Liver cirrhosis with hepatic encephalopathy - decompensated with hepatic encephalopathy, ascites - continue Lactulose - she is alert to person only, very alert. Limited insight into her medical  problems.  - agree with hospice  Apical pneumothorax on right - clinically stable, no hypoxia / hypotension - management per PCCM  AKI on CKD III-IV - renal function slowly worsening, ?hepatorenal - will give albumin today since she is losing significant amounts of pleural / ascitic fluid  DM - last A1C 6.6 in 12/2014 - continue SSI  Hypernatremia  Anemia of critical illness - stable, no bleeding  Acute encephalopathy - mental status fluctuates, more confused today  HTN - hold home antihypertensives  Hypotension - thought to be due to sedation  Positive blood culture with enterococcus 1/2 bottles - ID consulted, recommended against treatment as it was only 1/2 bottles and significance is unclear.   Chronic combined CHF - repeat echo 05/2015 with recovery of her EF, now 65-70%, grade 1 dd   DVT prophylaxis: heparin Code Status: DNR Family Communication: no family bedside Disposition Plan: TBD Barriers for discharge: chest tube with significant output  Consultants:   PCCM  ID  Palliative care  STUDIES: TTE 3/17: Mild LVH. EF 65-70%. No wall motion abnormality. Grade 1 diastolic dysfunction. LA mildly dilated & RA normal in size. RV normal in size & function. No aortic stenosis or regurg. Trivial mitral regurg w/o stenosis.No pulmonic stenosis. Trivial tricuspid regurg. No pericardial effusion.  Port CXR 3/18: Patchy bilateral opacities worse in lower lung zones. ETT & chest tube in good position. R IJ in good position. MICROBIOLOGY: Right Pleural Fluid AFB Ctx 3/15>>> Right Pleural Fluid Fungal Ctx 3/15>>> Right Pleural Fluid Ctx 3/15>>> negative  Tracheal Asp Ctx 3/15>>> negative MRSA PCR 3/15: Negative Urine Strep Ag 3/15: Negative  Urine Legionella Ag 3/15: Negative  Urine Ctx 3/14: Negative  Blood Ctx x2 3/14: Enterococcus 1/2 ANTIBIOTICS: Vancomycin 3/14 >> Cefepime 3/14 >>  LINES/TUBES:  OETT 7.5 3/15>>  R IJ CVL 3/15 >>  R chest tube  61fr 3/15 >>  Foley 3/15>>  OGT 3/15>>  SIGNIFICANT EVENTS:  3/14 Admit  3/14 CT head >> diffuse atrophy, chronic white matter ischemic changes  3/14 CT chest >> Rt pleural effusion with compressive ATX of Rt lung  3/14 EEG >> diffuse slowing  3/15 Rt arm doppler >> no DVT  3/15 Palliative care consult >> full code  3/20 extubate 3/22 TRH took over  Antimicrobials:  Vancomycin 3/15 >>  Cefepime 3/15 >>   Subjective: - very happy when I saw her, drinking coffee. Doesn't know where she is nor the date. She is surprised abut her chest tube, seems new to her despite the fact that she had it for 10 days now  Objective: Filed Vitals:   05/29/15 0500 05/29/15 1238 05/29/15 1953 05/30/15 0409  BP:  115/48 115/52 108/50  Pulse:  79 90 84  Temp:  98.2 F (36.8 C) 98.1 F (36.7 C) 98.4 F (36.9 C)  TempSrc:  Oral Oral Oral  Resp:  20 18 18   Height:      Weight:      SpO2: 95% 94% 95% 94%    Intake/Output Summary (Last 24 hours) at 05/30/15 1441 Last data filed at 05/30/15 1300  Gross per 24 hour  Intake    240 ml  Output   1300 ml  Net  -1060 ml   Filed Weights   05/27/15 0436 05/28/15 0459 05/29/15 0403  Weight: 81 kg (178 lb 9.2 oz) 75.7 kg (166 lb 14.2 oz) 77.3 kg (170 lb 6.7 oz)    Examination: BP 108/50 mmHg  Pulse 84  Temp(Src) 98.4 F (36.9 C) (Oral)  Resp 18  Ht 5\' 6"  (1.676 m)  Wt 77.3 kg (170 lb 6.7 oz)  BMI 27.52 kg/m2  SpO2 94%  GENERAL: NAD  HEENT: head NCAT, no scleral icterus. Pupils round and reactive.   LUNGS: Clear to auscultation. No wheezing or crackles. Chest tube in place  HEART: Regular rate and rhythm without murmur. 2+ pulses, no JVD, no peripheral edema  ABDOMEN: Soft, nontender, and nondistended. Positive bowel sounds.   EXTREMITIES: Without any cyanosis or clubbing  NEUROLOGIC: non focal    Data Reviewed: I have personally reviewed following labs and imaging studies  CBC:  Recent Labs Lab 05/25/15 0430 05/26/15 0600  05/27/15 0500 05/28/15 0432 05/29/15 0417  WBC 6.9 6.9 7.9 7.8 8.0  NEUTROABS 3.3 3.2 3.6 3.1 3.9  HGB 8.6* 8.4* 8.5* 8.7* 8.9*  HCT 27.1* 26.8* 25.9* 26.1* 27.9*  MCV 100.4* 100.8* 101.2* 102.0* 100.4*  PLT 173 170 176 163 [id number]   Basic Metabolic Panel:  Recent Labs Lab 05/24/15 0345 05/25/15 0430 05/26/15 0600 05/27/15 0500 05/28/15 0432 05/29/15 0417  NA 146* 148* 150* 147* 146* 143  K 3.6 3.6 3.7 3.7 4.3 4.3  CL 118* 116* 119* 118* 115* 114*  CO2 23 24 22 23 24 23   GLUCOSE 122* 115* 103* 176* 171* 262*  BUN 53* 50* 52* 53* 46* 48*  CREATININE 1.49* 1.51* 1.50* 1.55* 1.59* 1.73*  CALCIUM 9.1 9.3 9.3 9.0 8.9 8.9  MG 2.2 2.2 2.0 2.0  --   --   PHOS 3.4 3.3 3.3 2.3* 2.3* 3.7   GFR: Estimated Creatinine Clearance: 27.7 mL/min (by C-G formula  based on Cr of 1.73). Liver Function Tests:  Recent Labs Lab 05/25/15 0430 05/26/15 0600 05/27/15 0500 05/28/15 0432 05/29/15 0417  ALBUMIN 1.4* 1.3* 1.3* 1.2* 1.4*   No results for input(s): LIPASE, AMYLASE in the last 168 hours.  Recent Labs Lab 05/24/15 1145  AMMONIA 61*   Cardiac Enzymes: No results for input(s): CKTOTAL, CKMB, CKMBINDEX, TROPONINI in the last 168 hours. CBG:  Recent Labs Lab 05/28/15 1109 05/28/15 1617 05/28/15 2321 05/29/15 0645 05/29/15 1704  GLUCAP 206* 164* 296* 214* 270*   Urine analysis:    Component Value Date/Time   COLORURINE YELLOW 05/19/2015 2022   APPEARANCEUR CLEAR 05/19/2015 2022   LABSPEC 1.015 05/19/2015 2022   PHURINE 5.5 05/19/2015 2022   GLUCOSEU NEGATIVE 05/19/2015 2022   HGBUR NEGATIVE 05/19/2015 2022   BILIRUBINUR NEGATIVE 05/19/2015 2022   KETONESUR NEGATIVE 05/19/2015 2022   PROTEINUR NEGATIVE 05/19/2015 2022   UROBILINOGEN 1.0 01/06/2015 1225   NITRITE NEGATIVE 05/19/2015 2022   LEUKOCYTESUR NEGATIVE 05/19/2015 2022   Sepsis Labs: Invalid input(s): PROCALCITONIN, LACTICIDVEN  Recent Results (from the past 240 hour(s))  MRSA PCR Screening     Status:  None   Collection Time: 05/20/15  3:42 PM  Result Value Ref Range Status   MRSA by PCR NEGATIVE NEGATIVE Final    Comment:        The GeneXpert MRSA Assay (FDA approved for NASAL specimens only), is one component of a comprehensive MRSA colonization surveillance program. It is not intended to diagnose MRSA infection nor to guide or monitor treatment for MRSA infections.   Culture, respiratory (tracheal aspirate)     Status: None   Collection Time: 05/20/15  6:11 PM  Result Value Ref Range Status   Specimen Description TRACHEAL ASPIRATE  Final   Special Requests Normal  Final   Gram Stain   Final    MODERATE WBC PRESENT, PREDOMINANTLY PMN NO SQUAMOUS EPITHELIAL CELLS SEEN NO ORGANISMS SEEN Performed at [facility]    Culture   Final    NO GROWTH 2 DAYS Performed at [facility]    Report Status 05/23/2015 FINAL  Final  Acid Fast Smear (AFB)     Status: None   Collection Time: 05/21/15  8:30 AM  Result Value Ref Range Status   AFB Specimen Processing Concentration  Final   Acid Fast Smear Negative  Final    Comment: (NOTE) Performed At: [facility] [address] [place], [place] [id number] Lindon Romp MD [id number]    Source (AFB) FLUID  Final    Comment: PLEURAL  Fungus Culture With Stain (Not @ [facility])     Status: None (Preliminary result)   Collection Time: 05/21/15  8:30 AM  Result Value Ref Range Status   Fungus Stain Final report  Final    Comment: (NOTE) Performed At: [facility] [place], [place] [id number] Lindon Romp MD [id number]    Fungus (Mycology) Culture PENDING  Incomplete   Fungal Source FLUID  Final    Comment: PLEURAL  Body fluid culture     Status: None   Collection Time: 05/21/15  8:30 AM  Result Value Ref Range Status   Specimen Description FLUID PLEURAL  Final   Special Requests NONE  Final   Gram Stain   Final    MODERATE WBC PRESENT,BOTH PMN AND MONONUCLEAR NO ORGANISMS  SEEN    Culture NO GROWTH 3 DAYS  Final   Report Status 05/24/2015 FINAL  Final  Fungus  Culture Result     Status: None   Collection Time: 05/21/15  8:30 AM  Result Value Ref Range Status   Result 1 Comment  Final    Comment: (NOTE) KOH/Calcofluor preparation:  no fungus observed. Performed At: [facility] [place], [place] [id number] Lindon Romp MD [id number]     Radiology Studies: US Abdomen Limited  05/29/2015  CLINICAL DATA:  Cirrhosis.  Ascites. EXAM: US ABDOMEN LIMITED - RIGHT UPPER QUADRANT COMPARISON:  CT on 12/25/2014 FINDINGS: Gallbladder: No gallstones or wall thickening visualized. No sonographic Murphy sign noted by sonographer. Gallbladder wall calcification is seen consistent with porcelain gallbladder, as demonstrated on recent CT, however no associated soft tissue mass identified. Common bile duct: Diameter: Suboptimally visualized due to hepatic cirrhosis, but measures approximately 4 mm. Liver: Diffuse coarsening of hepatic echotexture seen with diffuse capsular nodularity, consistent with hepatic cirrhosis. No liver mass visualized sonographically. Other:  Mild to moderate ascites noted in all 4 abdominal quadrants. IMPRESSION: Hepatic cirrhosis.  No liver mass visualized sonographically. Porcelain gallbladder noted, without evidence of gallstones or gallbladder mass. No evidence of biliary ductal dilatation. Mild to moderate ascites. Electronically Signed   By: Earle Gell M.D.   On: 05/29/2015 10:25   [place] Art/ven Flow Abd Pelv Doppler  05/29/2015  CLINICAL DATA:  Cirrhosis EXAM: DUPLEX ULTRASOUND OF LIVER TECHNIQUE: Color and duplex Doppler ultrasound was performed to evaluate the hepatic in-flow and out-flow vessels. COMPARISON:  None. FINDINGS: Exam was markedly limited by a lack of patient cooperation and breathing. Portal Vein Velocities The portal vein was noted to be patent by color Doppler imaging, with hepato pedal flow. Doppler analysis  could not be performed due to technical limitations of the examination. The patient was very uncooperative. Hepatic Vein Velocities Right:  18.6 cm/sec Middle:  Not visualized Left:  33 cm/sec Hepatic Artery Velocity:  Not visualized Splenic Vein Velocity:  15 cm/sec Varices: Absent Ascites: Present Right pleural effusion is noted.  The spleen is normal in size. IMPRESSION: The examination was markedly limited by lack of patient cooperation and breathing. The portal vein is patent by color Doppler imaging but velocities could not be obtained. The hepatic artery and middle hepatic vein also were not visualized likely due to above factors described. Right pleural effusion and ascites are present. Electronically Signed   By: Marybelle Killings M.D.   On: 05/29/2015 09:51   Dg Chest Port 1 View  05/29/2015  CLINICAL DATA:  Shortness of breath. EXAM: PORTABLE CHEST 1 VIEW COMPARISON:  05/28/2015. FINDINGS: Right chest tube in stable position. Right IJ line in stable position. Heart size stable. Persistent right base atelectasis and or infiltrate. Persistent right pleural effusion. Mild left base subsegmental atelectasis. Stable left pleural thickening. Right chest wall subcutaneous emphysema again noted . IMPRESSION: 1. Right chest tube in stable position. Right IJ line stable position. 2. Persistent small right apical pneumothorax. Persistent right chest wall subcutaneous emphysema. 3. Persistent right lower lobe atelectasis and or infiltrate without interim change. Persistent small right pleural effusion. 4. Mild left base subsegmental atelectasis. Mild left base pleural thickening. Electronically Signed   By: Marcello Moores  Register   On: 05/29/2015 08:01   Dg Chest Port 1 View  05/28/2015  CLINICAL DATA:  Followup pneumothorax. EXAM: PORTABLE CHEST 1 VIEW COMPARISON:  Earlier film, same date. FINDINGS: The right IJ catheter is stable. Stable right basilar chest tube. There is a small persistent right apical pneumothorax and  stable subcutaneous emphysema. The cardiac silhouette,  mediastinal and hilar contours are stable and the lungs are unchanged. Persistent right basilar atelectasis and small effusion. IMPRESSION: Stable small apical right-sided pneumothorax. Electronically Signed   By: Marijo Sanes M.D.   On: 05/28/2015 18:43   Dg Swallowing Func-speech Pathology  05/29/2015  Objective Swallowing Evaluation: Type of Study: MBS-Modified Barium Swallow Study Patient Details Name: Emmajane Fryer MRN: [id number] Date of Birth: 1933/09/15 Today's Date: 05/29/2015 Time: SLP Start Time (ACUTE ONLY): 1300-SLP Stop Time (ACUTE ONLY): 1330 SLP Time Calculation (min) (ACUTE ONLY): 30 min Past Medical History: Past Medical History Diagnosis Date . Back pain  . Hypertension  . Carotid stenosis  . Colitis  . Diabetes mellitus without complication ([place])  . Pneumonia  Past Surgical History: Past Surgical History Procedure Laterality Date . Carotid endarterectomy   . Lumbar laminectomy for epidural abscess N/A 10/16/2013   Procedure: LUMBAR LAMINECTOMY FOR EPIDURAL ABSCESS Lumbar Two through Four;  Surgeon: Charlie Pitter, MD;  Location: [place];  Service: Neurosurgery;  Laterality: N/A; . Hip arthroplasty Right 01/08/2015   Procedure: RIGHT ANTERIOR APPROACH HEMI HIP ARTHROPLASTY;  Surgeon: Rod Can, MD;  Location: [facility];  Service: Orthopedics;  Laterality: Right; HPI: 82 y.o. female with h/o DM type 2, cirrhosis of the liver, CHF, chronic kidney disease, HTN, carotid stenosis and chronic anemia, who presented to ED after being found on floor at ALF for unknown period of time. Pt was recently d/c from hospital 2 weeks ago after tx for PNA, lower extremity cellulitis and decubitus ulcers. Intubated for period of 5 days (3/15-3/20). CT Head 3/14 no acute intracranial abnormality seen. CXR 3/20 persistent bibasilar atelectasis/pneumonia with small pleural effusions. No prior h/o SLP intervention found in chart. Objective study recommended to  assess swallow function and safety and identify least restrictive diet. Subjective: Pt seen in radiology for MBS. Pt pleasant and cooperative. Assessment / Plan / Recommendation CHL IP CLINICAL IMPRESSIONS 05/29/2015 Therapy Diagnosis Moderate pharyngeal phase dysphagia Clinical Impression Pt presents with mild-moderate sensorimotor based pharyngeal dysphagia. Swallow reflex is noted to be delayed on thin liquids, with trigger at the vallecular sinus via cup sip, and at the pyriform sinus via straw sips. Pt tends to be very impulsive with liquids, which results in penetration before and during the swallow, and aspiration after the swallow of large consecutive boluses.  Small, individual cup sips were noted to be less delayed, and exhibited intermitent flash penetration. Puree and solid consistencies were tolerated without difficulty or delay. Pt appears safe for regular solids and thin liquids WITH FULL SUPERVISION for small individual sips. If supervision cannot be provided, nectar thick liquids are recommended. Pt is VERY IMPULSIVE with liquids and will aspirate large consecutive boluses. Impact on safety and function Moderate aspiration risk if safe swallow precautions are not followed   CHL IP TREATMENT RECOMMENDATION 05/29/2015 Treatment Recommendations Therapy as outlined in treatment plan below   Prognosis 05/29/2015 Prognosis for Safe Diet Advancement Good Barriers to Reach Goals -- Barriers/Prognosis Comment -- CHL IP DIET RECOMMENDATION 05/29/2015 SLP Diet Recommendations Nectar thick liquid;Thin liquid Liquid Administration via Cup;No straw Medication Administration Whole meds with puree Compensations Minimize environmental distractions;Slow rate;Small sips/bites Postural Changes --   CHL IP OTHER RECOMMENDATIONS 05/29/2015 Recommended Consults -- Oral Care Recommendations Oral care QID Other Recommendations --   CHL IP FOLLOW UP RECOMMENDATIONS 05/29/2015 Follow up Recommendations (No Data)   CHL IP FREQUENCY  AND DURATION 05/29/2015 Speech Therapy Frequency (ACUTE ONLY) min 2x/week Treatment Duration 2 weeks  CHL IP ORAL PHASE 05/29/2015 Oral Phase WFL Oral - Pudding Teaspoon -- Oral - Pudding Cup -- Oral - Honey Teaspoon -- Oral - Honey Cup -- Oral - Nectar Teaspoon -- Oral - Nectar Cup -- Oral - Nectar Straw -- Oral - Thin Teaspoon -- Oral - Thin Cup -- Oral - Thin Straw -- Oral - Puree -- Oral - Mech Soft -- Oral - Regular -- Oral - Multi-Consistency -- Oral - Pill -- Oral Phase - Comment --  CHL IP PHARYNGEAL PHASE 05/29/2015 Pharyngeal Phase Impaired Pharyngeal- Pudding Teaspoon -- Pharyngeal -- Pharyngeal- Pudding Cup -- Pharyngeal -- Pharyngeal- Honey Teaspoon -- Pharyngeal -- Pharyngeal- Honey Cup -- Pharyngeal -- Pharyngeal- Nectar Teaspoon -- Pharyngeal -- Pharyngeal- Nectar Cup -- Pharyngeal -- Pharyngeal- Nectar Straw -- Pharyngeal -- Pharyngeal- Thin Teaspoon -- Pharyngeal -- Pharyngeal- Thin Cup Delayed swallow initiation-vallecula;Reduced airway/laryngeal closure;Penetration/Aspiration before swallow;Penetration/Aspiration during swallow;Penetration/Apiration after swallow;Trace aspiration Pharyngeal Material does not enter airway;Material enters airway, remains ABOVE vocal cords then ejected out;Material enters airway, CONTACTS cords and not ejected out Pharyngeal- Thin Straw Delayed swallow initiation-pyriform sinuses;Penetration/Aspiration before swallow;Penetration/Apiration after swallow;Penetration/Aspiration during swallow;Reduced airway/laryngeal closure;Trace aspiration Pharyngeal Material enters airway, CONTACTS cords and not ejected out Pharyngeal- Puree WFL Pharyngeal -- Pharyngeal- Mechanical Soft -- Pharyngeal -- Pharyngeal- Regular -- Pharyngeal -- Pharyngeal- Multi-consistency WFL Pharyngeal -- Pharyngeal- Pill -- Pharyngeal -- Pharyngeal Comment aspiration of thin liquids was noted on large, consecutive boluses. Trace flash penetration noted with small individual cup sips  CHL IP CERVICAL  ESOPHAGEAL PHASE 05/29/2015 Cervical Esophageal Phase WFL Pudding Teaspoon -- Pudding Cup -- Honey Teaspoon -- Honey Cup -- Nectar Teaspoon -- Nectar Cup -- Nectar Straw -- Thin Teaspoon -- Thin Cup -- Thin Straw -- Puree -- Mechanical Soft -- Regular -- Multi-consistency -- Pill -- Cervical Esophageal Comment -- No flowsheet data found. Shonna Chock 05/29/2015, 2:52 PM  Celia B. Quentin [facility], CCC-SLP [id number]              Scheduled Meds: . antiseptic oral rinse  7 mL Mouth Rinse q12n4p  . lactulose  20 g Oral TID   Continuous Infusions:    Marzetta Board, MD, PhD [facility] Pager [phone]  If 7PM-7AM, please contact night-coverage [email or web address] Password [facility] 05/30/2015, 2:41 PM

## 2015-05-30 NOTE — Progress Notes (Signed)
Chest tube Pleur-Evac full, changed to new container.  Also changed pt's BLE dressings, vaseline gauze and foam to legs/heels, abdominal pad covering, wrapped in curlex. Pt tolerated well.

## 2015-05-31 NOTE — Progress Notes (Signed)
Pt daily dressing change to BLE complete. Foam to heels placed yesterday clean dry and intact.  Xeroform to open wounds, ABDs, and wrapped in curlex.  Pt tolerated well.  She is notably less responsive than yesterday.  Refusing liquids as well as scheduled lactulose.

## 2015-05-31 NOTE — Progress Notes (Signed)
[facility] PCCM  Chart reviewed, discussed with attending. We are following from a distance.  Can remove chest tube once disposition on hospice arranged.  Could probably have chest tube in residential hospice facility, but will leave it up to palliative care team if this is feasible.  She seems to have tolerated it fairly well here.  Pleur-X is not a good option.  Roselie Awkward, MD [place] PCCM Pager: [phone] Cell: [phone] After 3pm or if no response, call [phone]

## 2015-05-31 NOTE — Progress Notes (Signed)
PROGRESS NOTE  Tamara Stephenson [id number] DOB: 1933/11/01 DOA: 05/19/2015 PCP: Curly Rim, MD Outpatient Specialists:    LOS: 12 days   Brief Narrative: 82 yo F with history of DM, liver cirrhosis, CHF, CKD, admitted on 3/15 with hypothermia, elevated lactic acid and acute encephalopathy, felt to be septic.   Assessment & Plan: Principal Problem:   Sepsis ([place]) Active Problems:   CKD (chronic kidney disease) stage 3, GFR 30-59 ml/min   Anemia of chronic disease   Diabetes mellitus with renal manifestations, uncontrolled (HCC)   Cirrhosis of liver with ascites (HCC)   HCAP (healthcare-associated pneumonia)   Dehydration   CHF (congestive heart failure) (HCC)   Pressure ulcer   Acute encephalopathy   Pleural effusion   Acute respiratory failure with hypoxia ([place])   Demand ischemia (HCC)   Encounter for central line placement   Respiratory failure ([place])   Chest tube in place   Pleural effusion, right   Palliative care encounter   Goals of care, counseling/discussion   Cirrhosis ([place])   Acute hypoxic respiratory failure due to HCAP and right sided pleural effusion - right pleural effusion s/p chest tubs placement 3/15. PCCM managing. Significant output still, wonder if this is related a component of hepatic hydrothorax and will just re-accumulate  - was on vent until 3/20, extubated, stable without significant oxygen requirements - finished 8 days for HCAP coverage on 3/22 - chest tube with significant outputstill. No optimal solution here, she has cirrhosis and ascites, suspect chest tube will continue with significant output. She has a component of hepatic encephalopathy with elevated ammonia levels and TIPS may make it far worse so I doubt that this would benefit patient.  - discussed with Dr. Lake Bells this morning, difficult to pull chest tube, will look into residential hospice facilities which can manage a chest tube  Liver cirrhosis with hepatic encephalopathy -  decompensated with hepatic encephalopathy, ascites - continue Lactulose - she is alert to person only, very alert. Limited insight into her medical problems.  - agree with hospice  Apical pneumothorax on right - clinically stable, no hypoxia / hypotension - chest tube in place  AKI on CKD III-IV - renal function slowly worsening, ?hepatorenal - comfort  DM - last A1C 6.6 in 12/2014 - continue SSI  Hypernatremia  Anemia of critical illness - stable, no bleeding  Acute encephalopathy - mental status fluctuates  HTN - hold home antihypertensives  Hypotension - thought to be due to sedation  Positive blood culture with enterococcus 1/2 bottles - ID consulted, recommended against treatment as it was only 1/2 bottles and significance is unclear.   Chronic combined CHF - repeat echo 05/2015 with recovery of her EF, now 65-70%, grade 1 dd   DVT prophylaxis: heparin Code Status: DNR Family Communication: no family bedside Disposition Plan: residential hospice Barriers for discharge: chest tube with significant output  Consultants:   PCCM  ID  Palliative care  STUDIES: TTE 3/17: Mild LVH. EF 65-70%. No wall motion abnormality. Grade 1 diastolic dysfunction. LA mildly dilated & RA normal in size. RV normal in size & function. No aortic stenosis or regurg. Trivial mitral regurg w/o stenosis.No pulmonic stenosis. Trivial tricuspid regurg. No pericardial effusion.  Port CXR 3/18: Patchy bilateral opacities worse in lower lung zones. ETT & chest tube in good position. R IJ in good position. MICROBIOLOGY: Right Pleural Fluid AFB Ctx 3/15>>> Right Pleural Fluid Fungal Ctx 3/15>>> Right Pleural Fluid Ctx 3/15>>> negative  Tracheal Asp Ctx 3/15>>> negative  MRSA PCR 3/15: Negative Urine Strep Ag 3/15: Negative Urine Legionella Ag 3/15: Negative  Urine Ctx 3/14: Negative  Blood Ctx x2 3/14: Enterococcus 1/2 ANTIBIOTICS: Vancomycin 3/14 >> Cefepime 3/14 >>    LINES/TUBES:  OETT 7.5 3/15>>  R IJ CVL 3/15 >>  R chest tube 71fr 3/15 >>  Foley 3/15>>  OGT 3/15>>  SIGNIFICANT EVENTS:  3/14 Admit  3/14 CT head >> diffuse atrophy, chronic white matter ischemic changes  3/14 CT chest >> Rt pleural effusion with compressive ATX of Rt lung  3/14 EEG >> diffuse slowing  3/15 Rt arm doppler >> no DVT  3/15 Palliative care consult >> full code  3/20 extubate 3/22 TRH took over  Antimicrobials:  Vancomycin 3/15 >>  Cefepime 3/15 >>   Subjective: - very happy when I saw her, drinking coffee. Doesn't know where she is nor the date. She is surprised abut her chest tube, seems new to her despite the fact that she had it for 10 days now  Objective: Filed Vitals:   05/30/15 0409 05/30/15 1430 05/30/15 2109 05/31/15 0659  BP: 108/50 131/43 124/47 118/49  Pulse: 84 80 85 91  Temp: 98.4 F (36.9 C) 98.4 F (36.9 C) 98.3 F (36.8 C) 97.9 F (36.6 C)  TempSrc: Oral Oral Oral Oral  Resp: 18 18 18 18   Height:      Weight:      SpO2: 94% 97% 98% 98%    Intake/Output Summary (Last 24 hours) at 05/31/15 1127 Last data filed at 05/31/15 0900  Gross per 24 hour  Intake    960 ml  Output   1630 ml  Net   -670 ml   Filed Weights   05/27/15 0436 05/28/15 0459 05/29/15 0403  Weight: 81 kg (178 lb 9.2 oz) 75.7 kg (166 lb 14.2 oz) 77.3 kg (170 lb 6.7 oz)    Examination: BP 118/49 mmHg  Pulse 91  Temp(Src) 97.9 F (36.6 C) (Oral)  Resp 18  Ht 5\' 6"  (1.676 m)  Wt 77.3 kg (170 lb 6.7 oz)  BMI 27.52 kg/m2  SpO2 98%  GENERAL: NAD  HEENT: head NCAT, no scleral icterus. Pupils round and reactive.   LUNGS: Clear to auscultation. No wheezing or crackles. Chest tube in place  HEART: Regular rate and rhythm without murmur. 2+ pulses, no JVD, no peripheral edema  ABDOMEN: Soft, nontender, and nondistended. Positive bowel sounds.   EXTREMITIES: Without any cyanosis or clubbing  NEUROLOGIC: non focal    Data Reviewed: I have personally  reviewed following labs and imaging studies  CBC:  Recent Labs Lab 05/25/15 0430 05/26/15 0600 05/27/15 0500 05/28/15 0432 05/29/15 0417  WBC 6.9 6.9 7.9 7.8 8.0  NEUTROABS 3.3 3.2 3.6 3.1 3.9  HGB 8.6* 8.4* 8.5* 8.7* 8.9*  HCT 27.1* 26.8* 25.9* 26.1* 27.9*  MCV 100.4* 100.8* 101.2* 102.0* 100.4*  PLT 173 170 176 163 [id number]   Basic Metabolic Panel:  Recent Labs Lab 05/25/15 0430 05/26/15 0600 05/27/15 0500 05/28/15 0432 05/29/15 0417  NA 148* 150* 147* 146* 143  K 3.6 3.7 3.7 4.3 4.3  CL 116* 119* 118* 115* 114*  CO2 24 22 23 24 23   GLUCOSE 115* 103* 176* 171* 262*  BUN 50* 52* 53* 46* 48*  CREATININE 1.51* 1.50* 1.55* 1.59* 1.73*  CALCIUM 9.3 9.3 9.0 8.9 8.9  MG 2.2 2.0 2.0  --   --   PHOS 3.3 3.3 2.3* 2.3* 3.7   GFR: Estimated Creatinine Clearance: 27.7 mL/min (by  C-G formula based on Cr of 1.73). Liver Function Tests:  Recent Labs Lab 05/25/15 0430 05/26/15 0600 05/27/15 0500 05/28/15 0432 05/29/15 0417  ALBUMIN 1.4* 1.3* 1.3* 1.2* 1.4*   No results for input(s): LIPASE, AMYLASE in the last 168 hours.  Recent Labs Lab 05/24/15 1145  AMMONIA 61*   Cardiac Enzymes: No results for input(s): CKTOTAL, CKMB, CKMBINDEX, TROPONINI in the last 168 hours. CBG:  Recent Labs Lab 05/28/15 1109 05/28/15 1617 05/28/15 2321 05/29/15 0645 05/29/15 1704  GLUCAP 206* 164* 296* 214* 270*   Urine analysis:    Component Value Date/Time   COLORURINE YELLOW 05/19/2015 2022   APPEARANCEUR CLEAR 05/19/2015 2022   LABSPEC 1.015 05/19/2015 2022   PHURINE 5.5 05/19/2015 2022   GLUCOSEU NEGATIVE 05/19/2015 2022   HGBUR NEGATIVE 05/19/2015 2022   BILIRUBINUR NEGATIVE 05/19/2015 2022   KETONESUR NEGATIVE 05/19/2015 2022   PROTEINUR NEGATIVE 05/19/2015 2022   UROBILINOGEN 1.0 01/06/2015 1225   NITRITE NEGATIVE 05/19/2015 2022   LEUKOCYTESUR NEGATIVE 05/19/2015 2022   Sepsis Labs: Invalid input(s): PROCALCITONIN, LACTICIDVEN  No results found for this or any  previous visit (from the past 240 hour(s)).  Radiology Studies: Dg Swallowing Func-speech Pathology  05/29/2015  Objective Swallowing Evaluation: Type of Study: MBS-Modified Barium Swallow Study Patient Details Name: Delaynie Cossio MRN: [id number] Date of Birth: 1933-08-28 Today's Date: 05/29/2015 Time: SLP Start Time (ACUTE ONLY): 1300-SLP Stop Time (ACUTE ONLY): 1330 SLP Time Calculation (min) (ACUTE ONLY): 30 min Past Medical History: Past Medical History Diagnosis Date . Back pain  . Hypertension  . Carotid stenosis  . Colitis  . Diabetes mellitus without complication ([place])  . Pneumonia  Past Surgical History: Past Surgical History Procedure Laterality Date . Carotid endarterectomy   . Lumbar laminectomy for epidural abscess N/A 10/16/2013   Procedure: LUMBAR LAMINECTOMY FOR EPIDURAL ABSCESS Lumbar Two through Four;  Surgeon: Charlie Pitter, MD;  Location: [facility];  Service: Neurosurgery;  Laterality: N/A; . Hip arthroplasty Right 01/08/2015   Procedure: RIGHT ANTERIOR APPROACH HEMI HIP ARTHROPLASTY;  Surgeon: Rod Can, MD;  Location: [facility];  Service: Orthopedics;  Laterality: Right; HPI: 82 y.o. female with h/o DM type 2, cirrhosis of the liver, CHF, chronic kidney disease, HTN, carotid stenosis and chronic anemia, who presented to ED after being found on floor at ALF for unknown period of time. Pt was recently d/c from hospital 2 weeks ago after tx for PNA, lower extremity cellulitis and decubitus ulcers. Intubated for period of 5 days (3/15-3/20). CT Head 3/14 no acute intracranial abnormality seen. CXR 3/20 persistent bibasilar atelectasis/pneumonia with small pleural effusions. No prior h/o SLP intervention found in chart. Objective study recommended to assess swallow function and safety and identify least restrictive diet. Subjective: Pt seen in radiology for MBS. Pt pleasant and cooperative. Assessment / Plan / Recommendation CHL IP CLINICAL IMPRESSIONS 05/29/2015 Therapy Diagnosis Moderate pharyngeal  phase dysphagia Clinical Impression Pt presents with mild-moderate sensorimotor based pharyngeal dysphagia. Swallow reflex is noted to be delayed on thin liquids, with trigger at the vallecular sinus via cup sip, and at the pyriform sinus via straw sips. Pt tends to be very impulsive with liquids, which results in penetration before and during the swallow, and aspiration after the swallow of large consecutive boluses.  Small, individual cup sips were noted to be less delayed, and exhibited intermitent flash penetration. Puree and solid consistencies were tolerated without difficulty or delay. Pt appears safe for regular solids and thin liquids WITH FULL SUPERVISION for small individual  sips. If supervision cannot be provided, nectar thick liquids are recommended. Pt is VERY IMPULSIVE with liquids and will aspirate large consecutive boluses. Impact on safety and function Moderate aspiration risk if safe swallow precautions are not followed   CHL IP TREATMENT RECOMMENDATION 05/29/2015 Treatment Recommendations Therapy as outlined in treatment plan below   Prognosis 05/29/2015 Prognosis for Safe Diet Advancement Good Barriers to Reach Goals -- Barriers/Prognosis Comment -- CHL IP DIET RECOMMENDATION 05/29/2015 SLP Diet Recommendations Nectar thick liquid;Thin liquid Liquid Administration via Cup;No straw Medication Administration Whole meds with puree Compensations Minimize environmental distractions;Slow rate;Small sips/bites Postural Changes --   CHL IP OTHER RECOMMENDATIONS 05/29/2015 Recommended Consults -- Oral Care Recommendations Oral care QID Other Recommendations --   CHL IP FOLLOW UP RECOMMENDATIONS 05/29/2015 Follow up Recommendations (No Data)   CHL IP FREQUENCY AND DURATION 05/29/2015 Speech Therapy Frequency (ACUTE ONLY) min 2x/week Treatment Duration 2 weeks      CHL IP ORAL PHASE 05/29/2015 Oral Phase WFL Oral - Pudding Teaspoon -- Oral - Pudding Cup -- Oral - Honey Teaspoon -- Oral - Honey Cup -- Oral -  Nectar Teaspoon -- Oral - Nectar Cup -- Oral - Nectar Straw -- Oral - Thin Teaspoon -- Oral - Thin Cup -- Oral - Thin Straw -- Oral - Puree -- Oral - Mech Soft -- Oral - Regular -- Oral - Multi-Consistency -- Oral - Pill -- Oral Phase - Comment --  CHL IP PHARYNGEAL PHASE 05/29/2015 Pharyngeal Phase Impaired Pharyngeal- Pudding Teaspoon -- Pharyngeal -- Pharyngeal- Pudding Cup -- Pharyngeal -- Pharyngeal- Honey Teaspoon -- Pharyngeal -- Pharyngeal- Honey Cup -- Pharyngeal -- Pharyngeal- Nectar Teaspoon -- Pharyngeal -- Pharyngeal- Nectar Cup -- Pharyngeal -- Pharyngeal- Nectar Straw -- Pharyngeal -- Pharyngeal- Thin Teaspoon -- Pharyngeal -- Pharyngeal- Thin Cup Delayed swallow initiation-vallecula;Reduced airway/laryngeal closure;Penetration/Aspiration before swallow;Penetration/Aspiration during swallow;Penetration/Apiration after swallow;Trace aspiration Pharyngeal Material does not enter airway;Material enters airway, remains ABOVE vocal cords then ejected out;Material enters airway, CONTACTS cords and not ejected out Pharyngeal- Thin Straw Delayed swallow initiation-pyriform sinuses;Penetration/Aspiration before swallow;Penetration/Apiration after swallow;Penetration/Aspiration during swallow;Reduced airway/laryngeal closure;Trace aspiration Pharyngeal Material enters airway, CONTACTS cords and not ejected out Pharyngeal- Puree WFL Pharyngeal -- Pharyngeal- Mechanical Soft -- Pharyngeal -- Pharyngeal- Regular -- Pharyngeal -- Pharyngeal- Multi-consistency WFL Pharyngeal -- Pharyngeal- Pill -- Pharyngeal -- Pharyngeal Comment aspiration of thin liquids was noted on large, consecutive boluses. Trace flash penetration noted with small individual cup sips  CHL IP CERVICAL ESOPHAGEAL PHASE 05/29/2015 Cervical Esophageal Phase WFL Pudding Teaspoon -- Pudding Cup -- Honey Teaspoon -- Honey Cup -- Nectar Teaspoon -- Nectar Cup -- Nectar Straw -- Thin Teaspoon -- Thin Cup -- Thin Straw -- Puree -- Mechanical Soft --  Regular -- Multi-consistency -- Pill -- Cervical Esophageal Comment -- No flowsheet data found. Shonna Chock 05/29/2015, 2:52 PM  Celia B. Quentin Ore [facility], CCC-SLP [id number]              Scheduled Meds: . antiseptic oral rinse  7 mL Mouth Rinse q12n4p  . lactulose  20 g Oral TID   Continuous Infusions:    Marzetta Board, MD, PhD [facility] Pager [phone]  If 7PM-7AM, please contact night-coverage [email or web address] Password [facility] 05/31/2015, 11:27 AM

## 2015-05-31 NOTE — Progress Notes (Signed)
Utilization review completed.  

## 2015-06-01 MED ORDER — HYDROMORPHONE HCL 1 MG/ML IJ SOLN
0.5000 mg | INTRAMUSCULAR | Status: AC
  Administered 2015-06-01: 0.5 mg via INTRAVENOUS
  Filled 2015-06-01: qty 1

## 2015-06-01 NOTE — Progress Notes (Signed)
Patient will discharge to [facility] Anticipated discharge date: 3/27 Family notified: friend, Architect by [employer]- scheduled for 5:45pm- if any concerns about transport call after hours PTAR number at [phone] (ext 1 then 2xt 3)  CSW signing off.  Domenica Reamer, [place] Social Worker [phone]

## 2015-06-01 NOTE — Care Management Important Message (Signed)
Important Message  Patient Details  Name: Tamara Stephenson MRN: [id number] Date of Birth: [birth year]   Medicare Important Message Given:  Yes    Dawayne Patricia, RN 06/01/2015, 4:00 PM

## 2015-06-01 NOTE — Progress Notes (Signed)
Pt being discharged to facility via [facility]. Pt alert and oriented x 2. VSS. Pt c/o no pain at this time. No signs of respiratory distress. Education complete and care plans resolved. IV removed with catheter intact and pt tolerated well. Pt discharged with foley catheter and chest tube per MD. Report called to [facility]. No further issues at this time. Pt to follow up with PCP. Leanne Chang, RN

## 2015-06-01 NOTE — Discharge Summary (Signed)
Physician Discharge Summary  Tamara Stephenson [id number] DOB: 01-05-1934 DOA: 05/19/2015  PCP: Curly Rim, MD  Admit date: 05/19/2015 Discharge date: 06/01/2015  Time spent: > 30 minutes  Recommendations for Outpatient Follow-up:  1. Discharged to SNF with hospice for comfort care    Discharge Diagnoses:  Principal Problem:   Sepsis ([place]) Active Problems:   CKD (chronic kidney disease) stage 3, GFR 30-59 ml/min   Anemia of chronic disease   Diabetes mellitus with renal manifestations, uncontrolled (HCC)   Cirrhosis of liver with ascites (HCC)   HCAP (healthcare-associated pneumonia)   Dehydration   CHF (congestive heart failure) (HCC)   Pressure ulcer   Acute encephalopathy   Pleural effusion   Acute respiratory failure with hypoxia ([place])   Demand ischemia (HCC)   Encounter for central line placement   Respiratory failure ([place])   Chest tube in place   Pleural effusion, right   Palliative care encounter   Goals of care, counseling/discussion   Cirrhosis [facility])  Discharge Condition: with hospice  Diet recommendation: as tolerated  Filed Weights   05/27/15 0436 05/28/15 0459 05/29/15 0403  Weight: 81 kg (178 lb 9.2 oz) 75.7 kg (166 lb 14.2 oz) 77.3 kg (170 lb 6.7 oz)    History of present illness:  See H&P, Labs, Consult and Test reports for all details in brief, patient is a 82 yo F with history of DM, liver cirrhosis, CHF, CKD, admitted on 3/15 with hypothermia, elevated lactic acid and acute encephalopathy, felt to be septic.   Hospital Course:  Acute hypoxic respiratory failure due to HCAP and right sided pleural effusion - right pleural effusion s/p chest tubs placement 3/15. PCCM managing. Significant output still, wonder if this is related a component of hepatic hydrothorax and will just re-accumulate. Wwas on vent until 3/20, extubated, stable without significant oxygen requirements. Finished 8 days for HCAP coverage on 3/22. Chest tube with significant  outputstill. No optimal solution here, she has cirrhosis and ascites, suspect chest tube will continue with significant output. She has a component of hepatic encephalopathy with elevated ammonia levels and TIPS may make it far worse so I doubt that this would benefit patient. Given terminal disease, with recurrent pleural effusion in the setting of end-stage liver disease, palliative care was consulted and followed patient while hospitalized. After discussing with patient and next of kin in [place], we'll transition care towards comfort, patient will be discharged to skilled nursing facility who can accommodate inpatient hospice. Liver cirrhosis with hepatic encephalopathy - decompensated with hepatic encephalopathy, ascites, continue Lactulose, she is alert to person only, very alert. Limited insight into her medical problems. Agree with hospice Apical pneumothorax on right - clinically stable, no hypoxia / hypotension, chest tube in place for comfort AKI on CKD III-IV - renal function slowly worsening, ?hepatorenal - comfort DM Hypernatremia Anemia of critical illness Acute encephalopathy HTN Hypotension Positive blood culture with enterococcus 1/2 bottles - ID consulted, recommended against treatment as it was only 1/2 bottles and significance is unclear.  Chronic combined CHF - repeat echo 05/2015 with recovery of her EF, now 65-70%, grade 1 dd  Procedures:  None    Consultations:  PCCM  Palliative  Discharge Exam: Filed Vitals:   05/31/15 1354 05/31/15 2137 06/01/15 0447 06/01/15 1351  BP: 132/49 109/47 111/43 112/40  Pulse: 84 86 79 79  Temp: 98 F (36.7 C) 98.5 F (36.9 C) 97.4 F (36.3 C) 98.1 F (36.7 C)  TempSrc: Oral Oral Oral Oral  Resp:  17 18 18 19   Height:      Weight:      SpO2: 99% 99% 98% 100%    General: NAD Cardiovascular: RRR Respiratory: decreased breath sounds on right, no wheezing  Discharge Instructions Activity:  As tolerated   Get  Medicines reviewed and adjusted: Please take all your medications with you for your next visit with your Primary MD  Please request your Primary MD to go over all hospital tests and procedure/radiological results at the follow up, please ask your Primary MD to get all Hospital records sent to his/her office.  If you experience worsening of your admission symptoms, develop shortness of breath, life threatening emergency, suicidal or homicidal thoughts you must seek medical attention immediately by calling 911 or calling your MD immediately if symptoms less severe.  You must read complete instructions/literature along with all the possible adverse reactions/side effects for all the Medicines you take and that have been prescribed to you. Take any new Medicines after you have completely understood and accpet all the possible adverse reactions/side effects.   Do not drive when taking Pain medications.   Do not take more than prescribed Pain, Sleep and Anxiety Medications  Special Instructions: If you have smoked or chewed Tobacco in the last 2 yrs please stop smoking, stop any regular Alcohol and or any Recreational drug use.  Wear Seat belts while driving.  Please note  You were cared for by a hospitalist during your hospital stay. Once you are discharged, your primary care physician will handle any further medical issues. Please note that NO REFILLS for any discharge medications will be authorized once you are discharged, as it is imperative that you return to your primary care physician (or establish a relationship with a primary care physician if you do not have one) for your aftercare needs so that they can reassess your need for medications and monitor your lab values.    Medication List    TAKE these medications        acetaminophen 500 MG tablet  Commonly known as:  TYLENOL  Take 500 mg by mouth every 6 (six) hours as needed for moderate pain.     acidophilus Caps capsule  Take  1 capsule by mouth daily.     aspirin 325 MG EC tablet  Take 1 tablet (325 mg total) by mouth 2 (two) times daily after a meal.     BuPROPion HCl ER (XL) 450 MG Tb24  Take 450 mg by mouth daily.     carvedilol 6.25 MG tablet  Commonly known as:  COREG  Take 1 tablet (6.25 mg total) by mouth 2 (two) times daily with a meal.     cholecalciferol 1000 units tablet  Commonly known as:  VITAMIN D  Take 2,000 Units by mouth daily.     furosemide 40 MG tablet  Commonly known as:  LASIX  Take 1 tablet (40 mg total) by mouth daily.     gabapentin 300 MG capsule  Commonly known as:  NEURONTIN  Take 1 capsule (300 mg total) by mouth at bedtime.     guaiFENesin-dextromethorphan 100-10 MG/5ML syrup  Commonly known as:  ROBITUSSIN DM  Take 10 mLs by mouth every 6 (six) hours as needed for cough.     insulin detemir 100 UNIT/ML injection  Commonly known as:  LEVEMIR  Inject 0.07 mLs (7 Units total) into the skin 2 (two) times daily.     liver oil-zinc oxide 40 % ointment  Commonly  known as:  DESITIN  Apply 1 application topically as needed for irritation.     lovastatin 20 MG tablet  Commonly known as:  MEVACOR  Take 20 mg by mouth at bedtime.     ondansetron 4 MG tablet  Commonly known as:  ZOFRAN  Take 4 mg by mouth every 6 (six) hours as needed for nausea or vomiting.     potassium chloride SA 20 MEQ tablet  Commonly known as:  K-DUR,KLOR-CON  Take 2 tablets (40 mEq total) by mouth daily.     ranitidine 300 MG tablet  Commonly known as:  ZANTAC  Take 300 mg by mouth daily as needed for heartburn.     SENOKOT S 8.6-50 MG tablet  Generic drug:  senna-docusate  Take 2 tablets by mouth daily.     ursodiol 300 MG capsule  Commonly known as:  ACTIGALL  Take 300 mg by mouth 2 (two) times daily.           Follow-up Information    Follow up with [facility] .   Specialty:  [place] information:   [address] [place] [phone]      The results of significant diagnostics from this hospitalization (including imaging, microbiology, ancillary and laboratory) are listed below for reference.    Significant Diagnostic Studies: Dg Chest 2 View  05/19/2015  CLINICAL DATA:  Fall 3 days ago, found on right side today. EXAM: CHEST  2 VIEW COMPARISON:  Chest x-ray dated 05/14/2015. FINDINGS: There is now a complete opacification of the right hemi thorax, presumably large pleural effusion. There is an associated slight leftward shift of the mediastinal structures. Left lung is clear. Visualized portions of the cardiomediastinal silhouette appears stable in configuration. No acute- appearing osseous abnormality. Degenerative changes noted at the shoulders. IMPRESSION: Interval development of a complete opacification of the right hemithorax, presumably large pleural effusion. Underlying consolidation/pneumonia cannot be excluded. Recommend chest CT for further characterization. Electronically Signed   By: Franki Cabot M.D.   On: 05/19/2015 18:49   Dg Pelvis 1-2 Views  05/20/2015  CLINICAL DATA:  Acute onset of generalized edema. Initial encounter. EXAM: PELVIS - 1-2 VIEW COMPARISON:  Pelvic radiograph performed 01/08/2015 FINDINGS: There is no evidence of fracture or dislocation. The right femoral prosthesis is grossly unremarkable in appearance, without evidence of loosening, though incompletely imaged. The left hip joint is unremarkable. Degenerative change is noted along the lower lumbar spine. The rectum is distended to 10.7 cm in transverse dimension with dense stool, concerning for fecal impaction. The remaining visualized bowel gas pattern is grossly unremarkable. IMPRESSION: 1. No evidence of fracture or dislocation. Right hip prosthesis is grossly unremarkable in appearance, though incompletely imaged. 2. Rectum distended to 10.7 cm in transverse dimension with stool, concerning  for fecal impaction. Electronically Signed   By: Garald Balding M.D.   On: 05/20/2015 02:27   Dg Elbow Complete Right  05/19/2015  CLINICAL DATA:  Fall 3 days ago, right elbow pain EXAM: RIGHT ELBOW - COMPLETE 3+ VIEW COMPARISON:  Right elbow plain film dated 09/15/2014. FINDINGS: Again noted is an old avulsion fracture fragment adjacent to the medial epicondyle. No acute- appearing fracture line or displaced fracture fragment. No significant degenerative change. No evidence of joint effusion. Surrounding soft tissues are unremarkable. IMPRESSION: No acute findings. Electronically Signed   By: Franki Cabot M.D.   On: 05/19/2015 18:45   Ct Head Wo Contrast  05/19/2015  CLINICAL DATA:  Altered mental status. EXAM: CT HEAD WITHOUT CONTRAST TECHNIQUE: Contiguous axial images were obtained from the base of the skull through the vertex without intravenous contrast. COMPARISON:  CT scan of May 09, 2015. FINDINGS: Bilateral maxillary sinusitis is noted. Mild diffuse cortical atrophy is noted. Mild chronic ischemic white matter disease is noted. No mass effect or midline shift is noted. Ventricular size is within normal limits. There is no evidence of mass lesion, hemorrhage or acute infarction. IMPRESSION: Mild diffuse cortical atrophy. Mild chronic ischemic white matter disease. No acute intracranial abnormality seen. Electronically Signed   By: Marijo Conception, M.D.   On: 05/19/2015 18:26   Ct Chest Wo Contrast  05/20/2015  CLINICAL DATA:  Recent fall. Prolonged time on floor, on right side. Facial pain and bilateral lower extremity swelling. Initial encounter. EXAM: CT CHEST WITHOUT CONTRAST TECHNIQUE: Multidetector CT imaging of the chest was performed following the standard protocol without IV contrast. COMPARISON:  Chest radiographs performed earlier today at 6:08 p.m., 05/02/2015, and 01/06/2015 FINDINGS: There is a large right-sided pleural effusion, occupying the right hemithorax, with complete collapse  of the right lung. The collapsed right lung appears somewhat irregular. This may reflect pneumonia. Underlying mass cannot excluded. Patchy left basilar airspace opacity raises concern for pneumonia. No pneumothorax is seen. Diffuse coronary artery calcifications are seen. The mediastinum is grossly unremarkable in appearance, though difficult to fully assess due to collapse of the right lung. The thyroid gland is unremarkable. No definite pericardial effusion is seen. No definite mediastinal lymphadenopathy is appreciated. Diffuse soft tissue edema is noted along the chest wall, more prominent on the left. Moderate volume ascites is seen within the upper abdomen. The nodular appearance of the liver raises concern for cirrhosis. The visualized portions of the spleen and pancreas are unremarkable. A stone is noted at the base of the gallbladder. No acute osseous abnormalities are seen. Degenerative change is noted at the glenohumeral joints bilaterally. Mild degenerative change is noted along the lower cervical spine, and at the lower thoracic spine. IMPRESSION: 1. Large right-sided pleural effusion occupies the right hemithorax, with complete collapse of the right lung. Somewhat irregular appearance to the collapsed right lung, concerning for pneumonia. Underlying mass cannot be excluded. Diagnostic thoracentesis could be considered for further evaluation. 2. Patchy left basilar airspace opacity also raises concern for pneumonia. 3. Diffuse coronary artery calcifications seen. 4. Diffuse soft tissue edema along the chest wall, more prominent on the left. 5. Moderate volume ascites within the upper abdomen. 6. Findings of hepatic cirrhosis. 7. Cholelithiasis noted. Electronically Signed   By: Garald Balding M.D.   On: 05/20/2015 00:47   US Abdomen Limited  05/29/2015  CLINICAL DATA:  Cirrhosis.  Ascites. EXAM: US ABDOMEN LIMITED - RIGHT UPPER QUADRANT COMPARISON:  CT on 12/25/2014 FINDINGS: Gallbladder: No  gallstones or wall thickening visualized. No sonographic Murphy sign noted by sonographer. Gallbladder wall calcification is seen consistent with porcelain gallbladder, as demonstrated on recent CT, however no associated soft tissue mass identified. Common bile duct: Diameter: Suboptimally visualized due to hepatic cirrhosis, but measures approximately 4 mm. Liver: Diffuse coarsening of hepatic echotexture seen with diffuse capsular nodularity, consistent with hepatic cirrhosis. No liver mass visualized sonographically. Other:  Mild to moderate ascites noted in all 4 abdominal quadrants. IMPRESSION: Hepatic cirrhosis.  No liver mass visualized sonographically. Porcelain gallbladder noted, without evidence of gallstones or gallbladder mass. No evidence of biliary ductal dilatation. Mild to moderate ascites. Electronically Signed   By: Jenny Reichmann  Kris Hartmann M.D.   On: 05/29/2015 10:25   [place] Art/ven Flow Abd Pelv Doppler  05/29/2015  CLINICAL DATA:  Cirrhosis EXAM: DUPLEX ULTRASOUND OF LIVER TECHNIQUE: Color and duplex Doppler ultrasound was performed to evaluate the hepatic in-flow and out-flow vessels. COMPARISON:  None. FINDINGS: Exam was markedly limited by a lack of patient cooperation and breathing. Portal Vein Velocities The portal vein was noted to be patent by color Doppler imaging, with hepato pedal flow. Doppler analysis could not be performed due to technical limitations of the examination. The patient was very uncooperative. Hepatic Vein Velocities Right:  18.6 cm/sec Middle:  Not visualized Left:  33 cm/sec Hepatic Artery Velocity:  Not visualized Splenic Vein Velocity:  15 cm/sec Varices: Absent Ascites: Present Right pleural effusion is noted.  The spleen is normal in size. IMPRESSION: The examination was markedly limited by lack of patient cooperation and breathing. The portal vein is patent by color Doppler imaging but velocities could not be obtained. The hepatic artery and middle hepatic vein also were not  visualized likely due to above factors described. Right pleural effusion and ascites are present. Electronically Signed   By: Marybelle Killings M.D.   On: 05/29/2015 09:51   Dg Chest Port 1 View  05/29/2015  CLINICAL DATA:  Shortness of breath. EXAM: PORTABLE CHEST 1 VIEW COMPARISON:  05/28/2015. FINDINGS: Right chest tube in stable position. Right IJ line in stable position. Heart size stable. Persistent right base atelectasis and or infiltrate. Persistent right pleural effusion. Mild left base subsegmental atelectasis. Stable left pleural thickening. Right chest wall subcutaneous emphysema again noted . IMPRESSION: 1. Right chest tube in stable position. Right IJ line stable position. 2. Persistent small right apical pneumothorax. Persistent right chest wall subcutaneous emphysema. 3. Persistent right lower lobe atelectasis and or infiltrate without interim change. Persistent small right pleural effusion. 4. Mild left base subsegmental atelectasis. Mild left base pleural thickening. Electronically Signed   By: Marcello Moores  Register   On: 05/29/2015 08:01   Dg Chest Port 1 View  05/28/2015  CLINICAL DATA:  Followup pneumothorax. EXAM: PORTABLE CHEST 1 VIEW COMPARISON:  Earlier film, same date. FINDINGS: The right IJ catheter is stable. Stable right basilar chest tube. There is a small persistent right apical pneumothorax and stable subcutaneous emphysema. The cardiac silhouette, mediastinal and hilar contours are stable and the lungs are unchanged. Persistent right basilar atelectasis and small effusion. IMPRESSION: Stable small apical right-sided pneumothorax. Electronically Signed   By: Marijo Sanes M.D.   On: 05/28/2015 18:43   Dg Chest Port 1 View  05/28/2015  CLINICAL DATA:  Shortness of breath. EXAM: PORTABLE CHEST 1 VIEW COMPARISON:  05/25/2015. FINDINGS: Interim extubation and removal of NG tube. Right IJ line in stable position. Right chest tube in stable position with tip projected over the medial right  costophrenic angle. Tiny right apical pneumothorax noted on today's exam. a right chest wall subcutaneous emphysema noted on today's exam. Heart size stable. Right base atelectasis and/or infiltrate again noted. IMPRESSION: 1. Interim extubation removal of NG tube. Right IJ line and right chest tube in stable position. New onset tiny right apical pneumothorax. New onset right chest wall subcutaneous emphysema. 2. Persistent right lower lobe atelectasis and/or infiltrate. Critical Value/emergent results were called by telephone at the time of interpretation on 05/28/2015 at 7:45 am to nurse Tiffany, who verbally acknowledged these results. Electronically Signed   By: Marcello Moores  Register   On: 05/28/2015 07:48   Dg Chest Port 1 View  05/25/2015  CLINICAL DATA:  Acute respiratory failure, CHF, healthcare associated pneumonia, sepsis EXAM: PORTABLE CHEST 1 VIEW COMPARISON:  Portable chest x-ray of May 23, 2015 FINDINGS: The lungs are slightly better inflated today. There are bilateral pleural effusions and bibasilar atelectasis or pneumonia. The endotracheal tube tip lies 2.5 cm above the carina. The heart is normal in size. The central pulmonary vascularity is mildly prominent and slightly more conspicuous overall today. There is no pneumothorax. The esophagogastric tube tip projects below the inferior margin of the image. The right sided chest tube tip projects over the medial costophrenic angle and is stable in position. The right internal jugular venous catheter tip projects over the midportion of the SVC. There are degenerative changes of both shoulders. IMPRESSION: Improved aeration of both lungs. Persistent bibasilar atelectasis/ pneumonia with small pleural effusions. Mild central pulmonary vascular congestion is more conspicuous today. The support tubes are in stable position. Electronically Signed   By: David  Martinique M.D.   On: 05/25/2015 07:11   Dg Chest Port 1 View  05/23/2015  CLINICAL DATA:  Status  post endotracheal placement EXAM: PORTABLE CHEST 1 VIEW COMPARISON:  05/23/2015 FINDINGS: Endotracheal tube is again identified 2.5 cm above the carina. A right jugular line and nasogastric catheter are noted in satisfactory position. Bibasilar changes are again identified and stable. No new focal abnormality is seen. IMPRESSION: No change from the previous exam.  Tubes and lines as described. Electronically Signed   By: Inez Catalina M.D.   On: 05/23/2015 17:25   Dg Chest Port 1 View  05/23/2015  CLINICAL DATA:  Intubated EXAM: PORTABLE CHEST 1 VIEW COMPARISON:  Chest radiograph from one day prior. FINDINGS: Endotracheal tube tip is 2.5 cm above the carina. Enteric tube enters the stomach with the tip not seen on this image. Right internal jugular central venous catheter terminates in the lower third of the superior vena cava. Stable cardiomediastinal silhouette with mild cardiomegaly. No pneumothorax. Stable small bilateral pleural effusions. Patchy bibasilar lung opacities are not appreciably changed. Low lung volumes with vascular crowding and no overt pulmonary edema. IMPRESSION: 1. Well-positioned support structures as described. 2. Stable small bilateral pleural effusions. 3. Stable patchy bibasilar lung consolidation, favor multifocal pneumonia. Electronically Signed   By: Ilona Sorrel M.D.   On: 05/23/2015 09:32   Dg Chest Port 1 View  05/22/2015  CLINICAL DATA:  Respiratory failure EXAM: PORTABLE CHEST 1 VIEW COMPARISON:  05/21/2015 FINDINGS: Endotracheal tube, central line and right basilar chest tube remain in place, unchanged. NG tube is again noted in the stomach. Bibasilar airspace opacities with small effusions, similar to prior study. No pneumothorax. Cardiomegaly with vascular congestion. IMPRESSION: Stable bibasilar opacities and small effusions. Vascular congestion. No change. Electronically Signed   By: Rolm Baptise M.D.   On: 05/22/2015 08:12   Portable Chest Xray  05/21/2015  CLINICAL  DATA:  Respiratory failure. EXAM: PORTABLE CHEST 1 VIEW COMPARISON:  May 20, 2015. FINDINGS: Stable cardiomediastinal silhouette. Endotracheal tube is unchanged in position. Right internal jugular catheter is unchanged in position. Interval placement of a nasogastric tube which is seen entering stomach. Hypoinflation of the lungs is noted with bibasilar opacities consistent with atelectasis or infiltrates. No pneumothorax is noted. Bony thorax is unremarkable. IMPRESSION: Stable support apparatus. Interval placement of nasogastric tube. Hypoinflation of the lungs is noted with bibasilar atelectasis or infiltrates. Electronically Signed   By: Marijo Conception, M.D.   On: 05/21/2015 07:16   Dg Chest Port 1 View  05/20/2015  CLINICAL  DATA:  82 year old female with history of intubation and chest tube insertion. EXAM: PORTABLE CHEST 1 VIEW COMPARISON:  Chest x-ray 05/20/2015. FINDINGS: An endotracheal tube is in place with tip 5.0 cm above the carina. New right-sided chest tube with tip directed toward the medial aspect of the base of the right hemithorax. Previously noted right IJ central venous catheter is similarly positioned with tip in the distal superior vena cava. Lung volumes are low. Improved aeration throughout the right lung related to decreased size of what is now a small to moderate right-sided pleural effusion. Bibasilar opacities may reflect areas of atelectasis and/or consolidation. No definite evidence of pulmonary edema. Heart size is borderline enlarged. The patient is rotated to the left on today's exam, resulting in distortion of the mediastinal contours and reduced diagnostic sensitivity and specificity for mediastinal pathology. Atherosclerosis in the thoracic aorta. IMPRESSION: 1. Support apparatus, as above. 2. Decreased size of what is now a small to moderate right-sided pleural effusion following placement of right-sided chest tube. 3. Low lung volumes with bibasilar areas of atelectasis  and/or airspace consolidation. Electronically Signed   By: Vinnie Langton M.D.   On: 05/20/2015 18:08   Dg Chest Port 1 View  05/20/2015  CLINICAL DATA:  Central line placement EXAM: PORTABLE CHEST 1 VIEW COMPARISON:  05/19/2015 FINDINGS: 1650 hours. Right IJ central line is new in the interval. Tip overlies the expected location of the mid SVC. Complete opacification of the right hemi thorax is again noted. Interval development of vascular congestion with interstitial opacity in the left lung. Cardiopericardial silhouette is at upper limits of normal for size. The visualized bony structures of the thorax are intact. Degenerative changes are noted in the glenohumeral joints bilaterally. Telemetry leads overlie the chest. IMPRESSION: Right IJ central line tip overlies the position of the mid SVC. Persistent whiteout of the right hemi thorax, better characterized on yesterday's chest CT. Electronically Signed   By: Misty Stanley M.D.   On: 05/20/2015 17:04   Dg Chest Port 1 View  05/02/2015  CLINICAL DATA:  Altered mental status, cough for 2 days. EXAM: PORTABLE CHEST 1 VIEW COMPARISON:  01/06/2015 FINDINGS: Moderate to large right pleural effusion with diffuse right lung airspace disease, most confluent in the right lung base. No confluent opacity on the left. Heart is borderline in size. No acute bony abnormality. IMPRESSION: Moderate to large right pleural effusion with diffuse right lung airspace disease concerning for pneumonia. Electronically Signed   By: Rolm Baptise M.D.   On: 05/02/2015 17:55   Dg Abd Portable 1v  05/23/2015  CLINICAL DATA:  Pt here for respiratory failure and pleural effusion after a fall. Pt is now currently experiencing constipation. Hx liver cirrhosis, colitis EXAM: PORTABLE ABDOMEN - 1 VIEW COMPARISON:  05/20/2015 FINDINGS: Nasogastric tube is in place, tip overlying the level of the stomach. Bowel gas pattern is nonobstructed. There is a paucity of bowel gas within the right  lower quadrant, raising the question of mass or ascites, displacing bowel loops. Significant degenerative changes are identified in the lumbar spine. Status post right hip arthroplasty. Right basilar chest tube. Bibasilar opacities obscure the hemidiaphragms. There are bilateral pleural effusions. IMPRESSION: 1. Significant degenerative changes in the spine. 2. Nonobstructed bowel-gas pattern. 3. Question of right lower quadrant mass or ascites displacing bowel loops. Electronically Signed   By: Nolon Nations M.D.   On: 05/23/2015 16:42   Dg Abd Portable 1v  05/20/2015  CLINICAL DATA:  OG tube placement. EXAM: PORTABLE  ABDOMEN - 1 VIEW COMPARISON:  None. FINDINGS: OG tube appears adequately positioned in the stomach with tip directed towards the gastric antrum/ pylorus. Additional catheter within the adjacent right upper quadrant. Visualized bowel gas pattern is nonobstructive. IMPRESSION: OG tube appears adequately positioned in the stomach with tip directed towards the gastric antrum/pylorus. Electronically Signed   By: Franki Cabot M.D.   On: 05/20/2015 20:35   Dg Humerus Right  05/20/2015  CLINICAL DATA:  Arm edema.  Dementia. EXAM: RIGHT HUMERUS - 2+ VIEW COMPARISON:  None. FINDINGS: Nonspecific arm edema. No evidence of fracture or focal bone lesion. Advanced right glenohumeral osteoarthritis with high humeral head. Degenerative or osteonecrotic sclerosis and flattening of the humeral head. Right AC osteoarthritis with superior spurring. White out of the right chest. IMPRESSION: 1. No acute osseous finding. 2. Advanced right glenohumeral osteoarthritis with chronic rotator cuff tear. Possible humeral head AVN. 3. White out of the right chest from effusion and collapse. Electronically Signed   By: Monte Fantasia M.D.   On: 05/20/2015 02:25   Dg Swallowing Func-speech Pathology  05/29/2015  Objective Swallowing Evaluation: Type of Study: MBS-Modified Barium Swallow Study Patient Details Name: Alectra Gellman MRN: [id number] Date of Birth: 1933/09/10 Today's Date: 05/29/2015 Time: SLP Start Time (ACUTE ONLY): 1300-SLP Stop Time (ACUTE ONLY): 1330 SLP Time Calculation (min) (ACUTE ONLY): 30 min Past Medical History: Past Medical History Diagnosis Date . Back pain  . Hypertension  . Carotid stenosis  . Colitis  . Diabetes mellitus without complication ([place])  . Pneumonia  Past Surgical History: Past Surgical History Procedure Laterality Date . Carotid endarterectomy   . Lumbar laminectomy for epidural abscess N/A 10/16/2013   Procedure: LUMBAR LAMINECTOMY FOR EPIDURAL ABSCESS Lumbar Two through Four;  Surgeon: Charlie Pitter, MD;  Location: [facility];  Service: Neurosurgery;  Laterality: N/A; . Hip arthroplasty Right 01/08/2015   Procedure: RIGHT ANTERIOR APPROACH HEMI HIP ARTHROPLASTY;  Surgeon: Rod Can, MD;  Location: [facility];  Service: Orthopedics;  Laterality: Right; HPI: 82 y.o. female with h/o DM type 2, cirrhosis of the liver, CHF, chronic kidney disease, HTN, carotid stenosis and chronic anemia, who presented to ED after being found on floor at ALF for unknown period of time. Pt was recently d/c from hospital 2 weeks ago after tx for PNA, lower extremity cellulitis and decubitus ulcers. Intubated for period of 5 days (3/15-3/20). CT Head 3/14 no acute intracranial abnormality seen. CXR 3/20 persistent bibasilar atelectasis/pneumonia with small pleural effusions. No prior h/o SLP intervention found in chart. Objective study recommended to assess swallow function and safety and identify least restrictive diet. Subjective: Pt seen in radiology for MBS. Pt pleasant and cooperative. Assessment / Plan / Recommendation CHL IP CLINICAL IMPRESSIONS 05/29/2015 Therapy Diagnosis Moderate pharyngeal phase dysphagia Clinical Impression Pt presents with mild-moderate sensorimotor based pharyngeal dysphagia. Swallow reflex is noted to be delayed on thin liquids, with trigger at the vallecular sinus via cup sip, and at the  pyriform sinus via straw sips. Pt tends to be very impulsive with liquids, which results in penetration before and during the swallow, and aspiration after the swallow of large consecutive boluses.  Small, individual cup sips were noted to be less delayed, and exhibited intermitent flash penetration. Puree and solid consistencies were tolerated without difficulty or delay. Pt appears safe for regular solids and thin liquids WITH FULL SUPERVISION for small individual sips. If supervision cannot be provided, nectar thick liquids are recommended. Pt is VERY IMPULSIVE with liquids and will  aspirate large consecutive boluses. Impact on safety and function Moderate aspiration risk if safe swallow precautions are not followed   CHL IP TREATMENT RECOMMENDATION 05/29/2015 Treatment Recommendations Therapy as outlined in treatment plan below   Prognosis 05/29/2015 Prognosis for Safe Diet Advancement Good Barriers to Reach Goals -- Barriers/Prognosis Comment -- CHL IP DIET RECOMMENDATION 05/29/2015 SLP Diet Recommendations Nectar thick liquid;Thin liquid Liquid Administration via Cup;No straw Medication Administration Whole meds with puree Compensations Minimize environmental distractions;Slow rate;Small sips/bites Postural Changes --   CHL IP OTHER RECOMMENDATIONS 05/29/2015 Recommended Consults -- Oral Care Recommendations Oral care QID Other Recommendations --   CHL IP FOLLOW UP RECOMMENDATIONS 05/29/2015 Follow up Recommendations (No Data)   CHL IP FREQUENCY AND DURATION 05/29/2015 Speech Therapy Frequency (ACUTE ONLY) min 2x/week Treatment Duration 2 weeks      CHL IP ORAL PHASE 05/29/2015 Oral Phase WFL Oral - Pudding Teaspoon -- Oral - Pudding Cup -- Oral - Honey Teaspoon -- Oral - Honey Cup -- Oral - Nectar Teaspoon -- Oral - Nectar Cup -- Oral - Nectar Straw -- Oral - Thin Teaspoon -- Oral - Thin Cup -- Oral - Thin Straw -- Oral - Puree -- Oral - Mech Soft -- Oral - Regular -- Oral - Multi-Consistency -- Oral - Pill -- Oral  Phase - Comment --  CHL IP PHARYNGEAL PHASE 05/29/2015 Pharyngeal Phase Impaired Pharyngeal- Pudding Teaspoon -- Pharyngeal -- Pharyngeal- Pudding Cup -- Pharyngeal -- Pharyngeal- Honey Teaspoon -- Pharyngeal -- Pharyngeal- Honey Cup -- Pharyngeal -- Pharyngeal- Nectar Teaspoon -- Pharyngeal -- Pharyngeal- Nectar Cup -- Pharyngeal -- Pharyngeal- Nectar Straw -- Pharyngeal -- Pharyngeal- Thin Teaspoon -- Pharyngeal -- Pharyngeal- Thin Cup Delayed swallow initiation-vallecula;Reduced airway/laryngeal closure;Penetration/Aspiration before swallow;Penetration/Aspiration during swallow;Penetration/Apiration after swallow;Trace aspiration Pharyngeal Material does not enter airway;Material enters airway, remains ABOVE vocal cords then ejected out;Material enters airway, CONTACTS cords and not ejected out Pharyngeal- Thin Straw Delayed swallow initiation-pyriform sinuses;Penetration/Aspiration before swallow;Penetration/Apiration after swallow;Penetration/Aspiration during swallow;Reduced airway/laryngeal closure;Trace aspiration Pharyngeal Material enters airway, CONTACTS cords and not ejected out Pharyngeal- Puree WFL Pharyngeal -- Pharyngeal- Mechanical Soft -- Pharyngeal -- Pharyngeal- Regular -- Pharyngeal -- Pharyngeal- Multi-consistency WFL Pharyngeal -- Pharyngeal- Pill -- Pharyngeal -- Pharyngeal Comment aspiration of thin liquids was noted on large, consecutive boluses. Trace flash penetration noted with small individual cup sips  CHL IP CERVICAL ESOPHAGEAL PHASE 05/29/2015 Cervical Esophageal Phase WFL Pudding Teaspoon -- Pudding Cup -- Honey Teaspoon -- Honey Cup -- Nectar Teaspoon -- Nectar Cup -- Nectar Straw -- Thin Teaspoon -- Thin Cup -- Thin Straw -- Puree -- Mechanical Soft -- Regular -- Multi-consistency -- Pill -- Cervical Esophageal Comment -- No flowsheet data found. Shonna Chock 05/29/2015, 2:52 PM  Celia B. Quentin [facility], CCC-SLP [id number]             Labs: Basic Metabolic  Panel:  Recent Labs Lab 05/26/15 0600 05/27/15 0500 05/28/15 0432 05/29/15 0417  NA 150* 147* 146* 143  K 3.7 3.7 4.3 4.3  CL 119* 118* 115* 114*  CO2 22 23 24 23   GLUCOSE 103* 176* 171* 262*  BUN 52* 53* 46* 48*  CREATININE 1.50* 1.55* 1.59* 1.73*  CALCIUM 9.3 9.0 8.9 8.9  MG 2.0 2.0  --   --   PHOS 3.3 2.3* 2.3* 3.7   Liver Function Tests:  Recent Labs Lab 05/26/15 0600 05/27/15 0500 05/28/15 0432 05/29/15 0417  ALBUMIN 1.3* 1.3* 1.2* 1.4*   CBC:  Recent Labs Lab 05/26/15 0600 05/27/15 0500 05/28/15 0432 05/29/15 0417  WBC 6.9 7.9 7.8 8.0  NEUTROABS 3.2 3.6 3.1 3.9  HGB 8.4* 8.5* 8.7* 8.9*  HCT 26.8* 25.9* 26.1* 27.9*  MCV 100.8* 101.2* 102.0* 100.4*  PLT 170 176 163 185   CBG:  Recent Labs Lab 05/28/15 1109 05/28/15 1617 05/28/15 2321 05/29/15 0645 05/29/15 1704  GLUCAP 206* 164* 296* 214* 270*       Signed:  Marzetta Board  [facility] 06/01/2015, 2:54 PM

## 2015-06-01 NOTE — Progress Notes (Signed)
PULMONARY / CRITICAL CARE MEDICINE   Name: Tamara Stephenson MRN: [id number] DOB: [birth year]    ADMISSION DATE:  05/19/2015 CONSULTATION DATE:  05/20/2015  REFERRING MD:  [facility]  CHIEF COMPLAINT:  Short of breath  BRIEF:  82 yo female smoker from assisted living with fall.  She was confused, hypothermic, and had elevated lactic acid.  She was found to have large Rt pleural effusion.  She required intubation for acute hypoxic respiratory failure. She had recent tx for Pna, cellulitis, decubitus ulcers at [facility].She has hx of cirrhosis, DM, systolic/diastolic CHF, CKD.  STUDIES: TTE 3/17:  Mild LVH. EF 65-70%. No wall motion abnormality. Grade 1 diastolic dysfunction. LA mildly dilated & RA normal in size. RV normal in size & function. No aortic stenosis or regurg. Trivial mitral regurg w/o stenosis.No pulmonic stenosis. Trivial tricuspid regurg. No pericardial effusion.  Port CXR 3/18:  Patchy bilateral opacities worse in lower lung zones. ETT & chest tube in good position. R IJ in good position.  MICROBIOLOGY: Right Pleural Fluid AFB Ctx 3/15>>> Right Pleural Fluid Fungal Ctx 3/15>>> Right Pleural Fluid Ctx 3/15>>> negative  Tracheal Asp Ctx 3/15>>> negative MRSA PCR 3/15:  Negative Urine Strep Ag 3/15:  Negative Urine Legionella Ag 3/15:  Negative   Urine Ctx 3/14:  Negative  Blood Ctx x2 3/14:  Enterococcus 1/2  ANTIBIOTICS: Vancomycin 3/14 >>off Cefepime 3/14 >> off  LINES/TUBES: OETT 7.5 3/15>> R IJ CVL 3/15 >> R chest tube 37fr 3/15 >> Foley 3/15>> OGT 3/15>>out  SIGNIFICANT EVENTS: 3/14 Admit 3/14 CT head >> diffuse atrophy, chronic white matter ischemic changes 3/14 CT chest >> Rt pleural effusion with compressive ATX of Rt lung 3/14 EEG >> diffuse slowing 3/15 Rt arm doppler >> no DVT 3/15 Palliative care consult >> full code 3/20 extubate  SUBJECTIVE:  Patient was resting comfortably , did not seem to be in distress. Denies any chest pain, shortness of  breath.  VITAL SIGNS: BP 111/43 mmHg  Pulse 79  Temp(Src) 97.4 F (36.3 C) (Oral)  Resp 18  Ht 5\' 6"  (1.676 m)  Wt 170 lb 6.7 oz (77.3 kg)  BMI 27.52 kg/m2  SpO2 98%  VENTILATOR SETTINGS:    INTAKE / OUTPUT: I/O last 3 completed shifts: In: 1440 [P.O.:1440] Out: 3032 [Urine:600; [place]; Chest Tube:2430]  PHYSICAL EXAMINATION: General:  Elderly sickly appearing female Integument:  Wounds on b/l extremities covered by bandage, warm, red HEENT: Atraumatic, normocephalic, no discharge Cardiovascular: S1S2, rrr. No MRG Pulmonary; Diminished subcutaneous emphysema on the right, no rales, rhonchi or crackles noted. Abdomen: Soft ,nontender hypoactive bowel sounds Neurological: alert ,oriented answers appropriately to orientation question.  LABS:  PULMONARY No results for input(s): PHART, PCO2ART, PO2ART, HCO3, TCO2, O2SAT in the last 168 hours.  Invalid input(s): PCO2, PO2 CBC  Recent Labs Lab 05/27/15 0500 05/28/15 0432 05/29/15 0417  HGB 8.5* 8.7* 8.9*  HCT 25.9* 26.1* 27.9*  WBC 7.9 7.8 8.0  PLT 176 163 185   COAGULATION No results for input(s): INR in the last 168 hours. CARDIAC No results for input(s): TROPONINI in the last 168 hours. No results for input(s): PROBNP in the last 168 hours.  CHEMISTRY  Recent Labs Lab 05/26/15 0600 05/27/15 0500 05/28/15 0432 05/29/15 0417  NA 150* 147* 146* 143  K 3.7 3.7 4.3 4.3  CL 119* 118* 115* 114*  CO2 22 23 24 23   GLUCOSE 103* 176* 171* 262*  BUN 52* 53* 46* 48*  CREATININE 1.50* 1.55* 1.59* 1.73*  CALCIUM 9.3  9.0 8.9 8.9  MG 2.0 2.0  --   --   PHOS 3.3 2.3* 2.3* 3.7   Estimated Creatinine Clearance: 27.7 mL/min (by C-G formula based on Cr of 1.73).  LIVER  Recent Labs Lab 05/26/15 0600 05/27/15 0500 05/28/15 0432 05/29/15 0417  ALBUMIN 1.3* 1.3* 1.2* 1.4*   INFECTIOUS No results for input(s): LATICACIDVEN, PROCALCITON in the last 168 hours. ENDOCRINE CBG (last 3)   Recent Labs   05/29/15 1704  GLUCAP 270*   IMAGING x48h No results found.  ASSESSMENT / PLAN:  A: Acute Hypoxic Respiratory Failure - Secondary to HCAP. HCAP Right Pleural Effusion - S/P Chest Tube 3/15, transudate-may be due to hepatic hydrothorax/CHF Tobacco Use  P:    DNR.status Encourage Ambulation as tolerated Chest tube to water seal, output  Still remains high Once goals of care clarified, would recommend discontinue chest tube   A:   Constipation  Liver Cirrhosis  P:   continue lactulose to PO. Dysphagia 3 diet per speech. Consider adding diuretic such as Aldactone Palliative care following   INFECTIOUS A:   HCAP  Parapneumonic Right Effusion Enterococcus Bacteremia - TTE negative. Positive blood culture with enterococcus 1/2 bottles P:    Id recommendation against treatment only positive in 1 out of 2 bottles Follow cultures to completion.  [facility]  06/01/2015 9:21 AM  PCCM Attending Note: Patient seen and examined with nurse practitioner. Please refer to her progress at which I reviewed. Testing was somewhat positional but continues to drain. Currently planned for discharge to [facility] with continued hospice care for comfort. Per documentation patient will transport with chest tube in place. This will be continued for patient's comfort. At this time PCCM will sign off. Please contact me if we can be of any further assistance in the care of this patient.  Sonia Baller Ashok Cordia, M.D. [facility] Pager:  [phone] After 3pm or if no response, call [phone] 4:30 PM 06/01/2015

## 2015-06-01 NOTE — Progress Notes (Addendum)
Palliative Medicine RN Note: RN leaving room. Pt was working hard to breathe this am; chest tube had had minimal output, but when repositioned, it drained nearly 544ml. Pt still grunting, working to breathe, denies pain, c/o itching to neck. Nurses report that she verbalizes pain during wound care, which is done daily to bilateral feet. Patient is confused, hearing "music in my ear." Per SW, we are still working on finding placement. Spoke to Ziggy to provide update; she continues to agree to strict comfort care and requested we contact her with updates on condition and/or hospice placement.  Larina Earthly, RN, BSN, [facility] 06/01/2015 1:38 PM Cell [phone] 8:00-4:00 Monday-Friday Office [phone]

## 2015-06-01 NOTE — Progress Notes (Addendum)
2:30am [facility] do not have beds for today but will follow for possible admission tomorrow  [facility]  Does not feel comfortable taking without family  CSW discussed with social work M[place]logger- we will send pt to [facility] under [facility] for room and board and have hospice follow pt at the facility.   8:30am CSW spoke with [facility] admissions this morning- no beds today but they will continue to follow  [facility] also going to evaluation patient for admission  Both facilities feel as if they can accommodate a chest tube if pt is needing that for comfort purposes  CSW will continue to follow  Domenica Reamer, [place] Worker [phone]

## 2015-06-01 NOTE — Care Management Note (Signed)
Case Management Note Previous CM note initiated by Geri Seminole RN, CM  Patient Details  Name: Tamara Stephenson MRN: [id number] Date of Birth: [birth year]  Subjective/Objective:                  Altered mental status.  Action/Plan: Discharge planning Expected Discharge Date:  06/01/15          Expected Discharge Plan:  [place]  In-House Referral:  Clinical Social Work  Discharge planning Services  CM Consult  Post Acute Care Choice:  NA Choice offered to:  NA  DME Arranged:    DME Agency:     HH Arranged:  RN, PT, OT [place] Agency:  Other - See comment  Status of Service:  Completed, signed off  Medicare Important Message Given:  Yes Date Medicare IM Given:    Medicare IM give by:    Date Additional Medicare IM Given:    Additional Medicare Important Message give by:     If discussed at [place] of Stay Meetings, dates discussed:  05/28/15  Additional Comments:  06/01/15- 1530- [name] RN, BSN- plan now is for comfort care with d/c to Hospice facility- per CSW no beds available today- and will d/c to SNF with hospice following.   05/28/15- 1130- [name] RN, BSN- Pt recommending SNF- CSW following for possible SNF placement- unsure if pt has capacity for decisions - PC consulted - HCPOA-  CM will follow for any assistance needed.    CM met with pt in room who confirms she lives at [facility] in [facility] and is already active with [facility].  Pt states she has all the DME needed at home. Cm received call from [facility] who state they have access to [facility] and please ensure orders have been placed to resume [place] care.  CM texted MD who placed order for HHPT/OT/RN.  CSW arranging transportation home.  No other CM needs were communicated.   Dawayne Patricia, RN 06/01/2015, 3:40 PM

## 2015-06-02 ENCOUNTER — Telehealth (HOSPITAL_BASED_OUTPATIENT_CLINIC_OR_DEPARTMENT_OTHER): Payer: Self-pay | Admitting: Emergency Medicine

## 2015-06-02 ENCOUNTER — Emergency Department (HOSPITAL_COMMUNITY)
Admission: EM | Admit: 2015-06-02 | Discharge: 2015-06-02 | Disposition: A | Discharge status: Home / Self Care | Payer: Medicare HMO | Attending: Emergency Medicine | Admitting: Emergency Medicine

## 2015-06-02 ENCOUNTER — Emergency Department (HOSPITAL_COMMUNITY): Payer: Medicare HMO

## 2015-06-02 ENCOUNTER — Encounter: Payer: Self-pay | Admitting: Internal Medicine

## 2015-06-02 ENCOUNTER — Encounter (HOSPITAL_COMMUNITY): Payer: Self-pay | Admitting: *Deleted

## 2015-06-02 DIAGNOSIS — J90 Pleural effusion, not elsewhere classified: Secondary | ICD-10-CM

## 2015-06-02 DIAGNOSIS — E119 Type 2 diabetes mellitus without complications: Secondary | ICD-10-CM | POA: Insufficient documentation

## 2015-06-02 DIAGNOSIS — R05 Cough: Secondary | ICD-10-CM | POA: Diagnosis present

## 2015-06-02 DIAGNOSIS — Z79899 Other long term (current) drug therapy: Secondary | ICD-10-CM | POA: Diagnosis not present

## 2015-06-02 DIAGNOSIS — Z8701 Personal history of pneumonia (recurrent): Secondary | ICD-10-CM | POA: Diagnosis not present

## 2015-06-02 DIAGNOSIS — I10 Essential (primary) hypertension: Secondary | ICD-10-CM | POA: Insufficient documentation

## 2015-06-02 DIAGNOSIS — F17200 Nicotine dependence, unspecified, uncomplicated: Secondary | ICD-10-CM | POA: Diagnosis not present

## 2015-06-02 DIAGNOSIS — Z794 Long term (current) use of insulin: Secondary | ICD-10-CM | POA: Insufficient documentation

## 2015-06-02 DIAGNOSIS — Z609 Problem related to social environment, unspecified: Secondary | ICD-10-CM | POA: Insufficient documentation

## 2015-06-02 DIAGNOSIS — Z9689 Presence of other specified functional implants: Secondary | ICD-10-CM

## 2015-06-02 DIAGNOSIS — Z659 Problem related to unspecified psychosocial circumstances: Secondary | ICD-10-CM

## 2015-06-02 DIAGNOSIS — Z7982 Long term (current) use of aspirin: Secondary | ICD-10-CM | POA: Diagnosis not present

## 2015-06-02 HISTORY — DX: Unspecified cirrhosis of liver: K74.60

## 2015-06-02 HISTORY — DX: Pleural effusion, not elsewhere classified: J90

## 2015-06-02 HISTORY — DX: Presence of other specified functional implants: Z96.89

## 2015-06-02 MED ORDER — PRAVASTATIN SODIUM 40 MG PO TABS: 20.0000 mg | ORAL_TABLET | Freq: Every day | ORAL | Status: DC

## 2015-06-02 MED ORDER — SENNOSIDES-DOCUSATE SODIUM 8.6-50 MG PO TABS: 2.0000 | ORAL_TABLET | Freq: Every day | ORAL | Status: DC

## 2015-06-02 MED ORDER — CARVEDILOL 12.5 MG PO TABS: 6.2500 mg | ORAL_TABLET | Freq: Two times a day (BID) | ORAL | Status: DC

## 2015-06-02 MED ORDER — URSODIOL 300 MG PO CAPS
300.0000 mg | ORAL_CAPSULE | Freq: Two times a day (BID) | ORAL | Status: DC
  Filled 2015-06-02: qty 1

## 2015-06-02 MED ORDER — FUROSEMIDE 20 MG PO TABS: 40.0000 mg | ORAL_TABLET | Freq: Every day | ORAL | Status: DC

## 2015-06-02 MED ORDER — ONDANSETRON HCL 4 MG PO TABS: 4.0000 mg | ORAL_TABLET | Freq: Four times a day (QID) | ORAL | Status: DC | PRN

## 2015-06-02 MED ORDER — ASPIRIN EC 325 MG PO TBEC: 325.0000 mg | DELAYED_RELEASE_TABLET | Freq: Two times a day (BID) | ORAL | Status: DC

## 2015-06-02 MED ORDER — INSULIN DETEMIR 100 UNIT/ML ~~LOC~~ SOLN
7.0000 [IU] | Freq: Two times a day (BID) | SUBCUTANEOUS | Status: DC
  Filled 2015-06-02: qty 0.07

## 2015-06-02 MED ORDER — POTASSIUM CHLORIDE CRYS ER 20 MEQ PO TBCR: 40.0000 meq | EXTENDED_RELEASE_TABLET | Freq: Every day | ORAL | Status: DC

## 2015-06-02 MED ORDER — VITAMIN D 1000 UNITS PO TABS: 2000.0000 [IU] | ORAL_TABLET | Freq: Every day | ORAL | Status: DC

## 2015-06-02 MED ORDER — BUPROPION HCL ER (XL) 150 MG PO TB24: 450.0000 mg | ORAL_TABLET | Freq: Every day | ORAL | Status: DC

## 2015-06-02 MED ORDER — GUAIFENESIN-DM 100-10 MG/5ML PO SYRP: 10.0000 mL | ORAL_SOLUTION | Freq: Four times a day (QID) | ORAL | Status: DC | PRN

## 2015-06-02 MED ORDER — GABAPENTIN 300 MG PO CAPS: 300.0000 mg | ORAL_CAPSULE | Freq: Every day | ORAL | Status: DC

## 2015-06-02 NOTE — ED Notes (Signed)
Pt not in "discharge" disposition. Clarified with Chrislyn if pt actually up for DC and for DC instructions. Will reprint AVS.

## 2015-06-02 NOTE — ED Provider Notes (Addendum)
CSN: [id number]     Arrival date & time 06/02/15  1339 History   First MD Initiated Contact with Patient 06/02/15 1340     Chief Complaint  Patient presents with  . Pleural Effusion     (Consider location/radiation/quality/duration/timing/severity/associated sxs/prior Treatment) HPI Patient return from [facility] due to their inability to manage the patient's Pleur-evac. No acute complications. Level of care was reportedly not appropriate. Patient does not have any acute complaints. She is alert and interactive. Past Medical History  Diagnosis Date  . Back pain   . Hypertension   . Carotid stenosis   . Colitis   . Diabetes mellitus without complication ([place])   . Pneumonia   . Pleural effusion on right   . Chest tube in place   . Liver cirrhosis [facility])    Past Surgical History  Procedure Laterality Date  . Carotid endarterectomy    . Lumbar laminectomy for epidural abscess N/A 10/16/2013    Procedure: LUMBAR LAMINECTOMY FOR EPIDURAL ABSCESS Lumbar Two through Four;  Surgeon: Charlie Pitter, MD;  Location: [facility];  Service: Neurosurgery;  Laterality: N/A;  . Hip arthroplasty Right 01/08/2015    Procedure: RIGHT ANTERIOR APPROACH HEMI HIP ARTHROPLASTY;  Surgeon: Rod Can, MD;  Location: [facility];  Service: Orthopedics;  Laterality: Right;   Family History  Problem Relation Age of Onset  . Leukemia Mother   . Jaundice Father    Social History  Substance Use Topics  . Smoking status: Current Every Day Smoker -- 0.20 packs/day  . Smokeless tobacco: None  . Alcohol Use: No   OB History    No data available     Review of Systems  10 Systems reviewed and are negative for acute change except as noted in the HPI.   Allergies  Peanut-containing drug products  Home Medications   Prior to Admission medications   Medication Sig Start Date End Date Taking? Authorizing Provider  acetaminophen (TYLENOL) 500 MG tablet Take 500 mg by mouth every 6 (six) hours as needed for  moderate pain.    Yes Historical Provider, MD  acidophilus (RISAQUAD) CAPS capsule Take 1 capsule by mouth daily. 12/30/14  Yes Florencia Reasons, MD  aspirin EC 325 MG EC tablet Take 1 tablet (325 mg total) by mouth 2 (two) times daily after a meal. 01/09/15  Yes Theodis Blaze, MD  BuPROPion HCl ER, XL, 450 MG TB24 Take 450 mg by mouth daily.   Yes Historical Provider, MD  carvedilol (COREG) 6.25 MG tablet Take 1 tablet (6.25 mg total) by mouth 2 (two) times daily with a meal. 12/30/14  Yes Florencia Reasons, MD  cholecalciferol (VITAMIN D) 1000 UNITS tablet Take 2,000 Units by mouth daily.    Yes Historical Provider, MD  furosemide (LASIX) 40 MG tablet Take 1 tablet (40 mg total) by mouth daily. 12/30/14  Yes Florencia Reasons, MD  gabapentin (NEURONTIN) 300 MG capsule Take 1 capsule (300 mg total) by mouth at bedtime. 10/21/13  Yes Marianne L York, PA-C  guaiFENesin-dextromethorphan (ROBITUSSIN DM) 100-10 MG/5ML syrup Take 10 mLs by mouth every 6 (six) hours as needed for cough.   Yes Historical Provider, MD  insulin detemir (LEVEMIR) 100 UNIT/ML injection Inject 0.07 mLs (7 Units total) into the skin 2 (two) times daily. 05/05/15  Yes Geradine Girt, DO  liver oil-zinc oxide (DESITIN) 40 % ointment Apply 1 application topically as needed for irritation.   Yes Historical Provider, MD  lovastatin (MEVACOR) 20 MG tablet Take 20  mg by mouth at bedtime.   Yes Historical Provider, MD  ondansetron (ZOFRAN) 4 MG tablet Take 4 mg by mouth every 6 (six) hours as needed for nausea or vomiting.   Yes Historical Provider, MD  potassium chloride SA (K-DUR,KLOR-CON) 20 MEQ tablet Take 2 tablets (40 mEq total) by mouth daily. 12/30/14  Yes Florencia Reasons, MD  ranitidine (ZANTAC) 300 MG tablet Take 300 mg by mouth daily as needed for heartburn.    Yes Historical Provider, MD  senna-docusate (SENOKOT S) 8.6-50 MG tablet Take 2 tablets by mouth daily.    Yes Historical Provider, MD  ursodiol (ACTIGALL) 300 MG capsule Take 300 mg by mouth 2 (two) times  daily.    Yes Historical Provider, MD   BP 121/46 mmHg  Pulse 82  Temp(Src) 97.4 F (36.3 C) (Oral)  Resp 18  SpO2 98% Physical Exam  Constitutional: She is oriented to person, place, and time. She appears well-developed and well-nourished.  Patient is alert and nontoxic. Has no respiratory distress.  HENT:  Head: Normocephalic and atraumatic.  Eyes: EOM are normal. Pupils are equal, round, and reactive to light.  Neck: Neck supple.  Cardiovascular: Normal rate, regular rhythm, normal heart sounds and intact distal pulses.   Pulmonary/Chest: Effort normal and breath sounds normal.  Patient has a chest tube in place. There is-colored serous fluid present.  Abdominal: Soft. Bowel sounds are normal. She exhibits no distension. There is no tenderness.  Musculoskeletal:  Patient has bilateral support boots in place. She has bilateral erythema of the lower extremities. She reports this is been chronic.  Neurological: She is alert and oriented to person, place, and time. She has normal strength. Coordination normal. GCS eye subscore is 4. GCS verbal subscore is 5. GCS motor subscore is 6.  Skin: Skin is warm, dry and intact.  Psychiatric: She has a normal mood and affect.    ED Course  Procedures (including critical care time) Labs Review Labs Reviewed - No data to display  Imaging Review Dg Chest [facility] 1 View  06/02/2015  CLINICAL DATA:  Chest tube monitoring. EXAM: PORTABLE CHEST 1 VIEW COMPARISON:  Four days ago FINDINGS: Right basilar chest tube in similar position. A small right apical and basilar pneumothorax, 5% or less, is unchanged. Right chest wall gas is still seen but decreased. Interstitial crowding without overt edema. Small bilateral pleural effusion that is stable. Interval removal of right IJ central line. IMPRESSION: Stable small right pneumothorax.  Chest wall emphysema is decreased. Electronically Signed   By: Monte Fantasia M.D.   On: 06/02/2015 14:33   I have  personally reviewed and evaluated these images and lab results as part of my medical decision-making.   EKG Interpretation None      MDM   Final diagnoses:  Pleural effusion, right  Social problem  Chest tube in place   Patient is awaiting placement. She arrives in stable condition. No Change and chest x-ray. MS status clear without respiratory distress. Vital signs stable. Significant chronic underlying medical conditions. Awaiting social work resolution of placement.    Charlesetta Shanks, MD 06/02/15 1707  Charlesetta Shanks, MD 06/03/15 [phone]

## 2015-06-02 NOTE — Progress Notes (Addendum)
CSW was working with pt during their previous admission.  CSW received multiple calls concerning pt care at [facility] this morning- home hospice and [facility] reportedly were unable to care for pt drain at SNF and pt had to be returned to ED.  CSW is assisting in finding hospice placement for pt at residential hospice facility   [facility]- no beds at this time but they will continue to follow pt- they can care for pt chest tube needs [facility]- unwilling to take pt without family/friend who will take responsibility for body [facility]- feels as if they could handle Pleurex drain but can not accept pt with current drain [facility]- unsure about not having family/friends to claim body- evaluating (update- no beds currently but will put on wait list) [facility]- has wait list [facility]- referral sent- offer pending [facility] referral sent- offer pending  CSW will continue to follow  Domenica Reamer, [place] Social Worker [phone]

## 2015-06-02 NOTE — ED Notes (Signed)
Pt's PLEUR-EVAC changed out with 184ml in old container.  Low wall suction applied to new system.

## 2015-06-02 NOTE — Progress Notes (Signed)
CSW aware of Patient in need of residential hospice. Patient is being followed by Domenica Reamer, CSW [phone]) for residential hospice placement.   Holly Bodily, LCSW [facility] ED Clinical Social Worker [phone]

## 2015-06-02 NOTE — ED Notes (Signed)
Regular dinner tray ordered for pt

## 2015-06-02 NOTE — ED Notes (Signed)
This RN spoke with [name] at [facility] at [phone].  [name] states staff at facility unable to change Pleurex overnight.  Mariann Laster, case Freight forwarder, consulted.

## 2015-06-02 NOTE — Progress Notes (Unsigned)
Patient ID: Tamara Stephenson, female   DOB: 1933/08/31, 82 y.o.   MRN: [id number] Received call from [facility] that patient was discharged to facility with chest tube that they could not manage. I was not aware that patient was discharged to [facility] at 545PM on 3/27 for SNF with Hospice. I spoke with Dr. Cruzita Lederer who told me that he was contacted by social worker who explained that SNF with hospice was identical to care delivered at a residential hospice facility and that Ms. Dinger had a bed available.   Patient was discharged with a letter of guarantee, hospice services were not arranged and the SNF is not equipped to deal with a high output hydrothorax chest and heavy symptom burden. Palliative RN spoke with SNF staff -bulb suction is not acceptable for chest tube management in symptomatic patient. This patient needs a hospice facility for acute EOL symptom management not SNF which even with hospice services is not able to handle this level of EOL care. Patient being sent back to the ED.   Lane Hacker, DO Palliative Medicine [phone]

## 2015-06-02 NOTE — ED Notes (Signed)
Called [facility] for pickup and transportation to [facility].

## 2015-06-02 NOTE — Care Management (Signed)
ED CM consulted, regarding patient awaiting return to [facility] with CT. Drainage collection unit changed to unit with drainage port for emptying. Chrislyn RN on [employer] spoke with [name] NP at [place] patient can return with new CT drainage system.  [facility] notified for transport.

## 2015-06-03 ENCOUNTER — Telehealth: Payer: Self-pay | Admitting: *Deleted

## 2015-06-03 NOTE — Telephone Encounter (Signed)
Facility called stating they did not have replacement catheters for PleurX drainage system.  ERCM filled out A-11 and called materials management to find that they have already filled the order and that the supplies should reach the facility this AM.  ERCM returned call to Laurena Bering who states supplies HAVE arrived.

## 2015-06-06 DEATH — deceased

## 2015-06-18 LAB — FUNGUS CULTURE RESULT

## 2015-06-18 LAB — FUNGUS CULTURE WITH STAIN

## 2015-06-18 LAB — FUNGAL ORGANISM REFLEX

## 2015-07-03 LAB — ACID FAST CULTURE WITH REFLEXED SENSITIVITIES: ACID FAST CULTURE - AFSCU3: NEGATIVE

## 2018-02-07 IMAGING — CR DG CHEST 1V PORT
1 series · 1 of 1 positions shown · non-contrast
Comparison: Four days ago

CLINICAL DATA: Chest tube monitoring.

EXAM:
PORTABLE CHEST 1 VIEW

[AP]
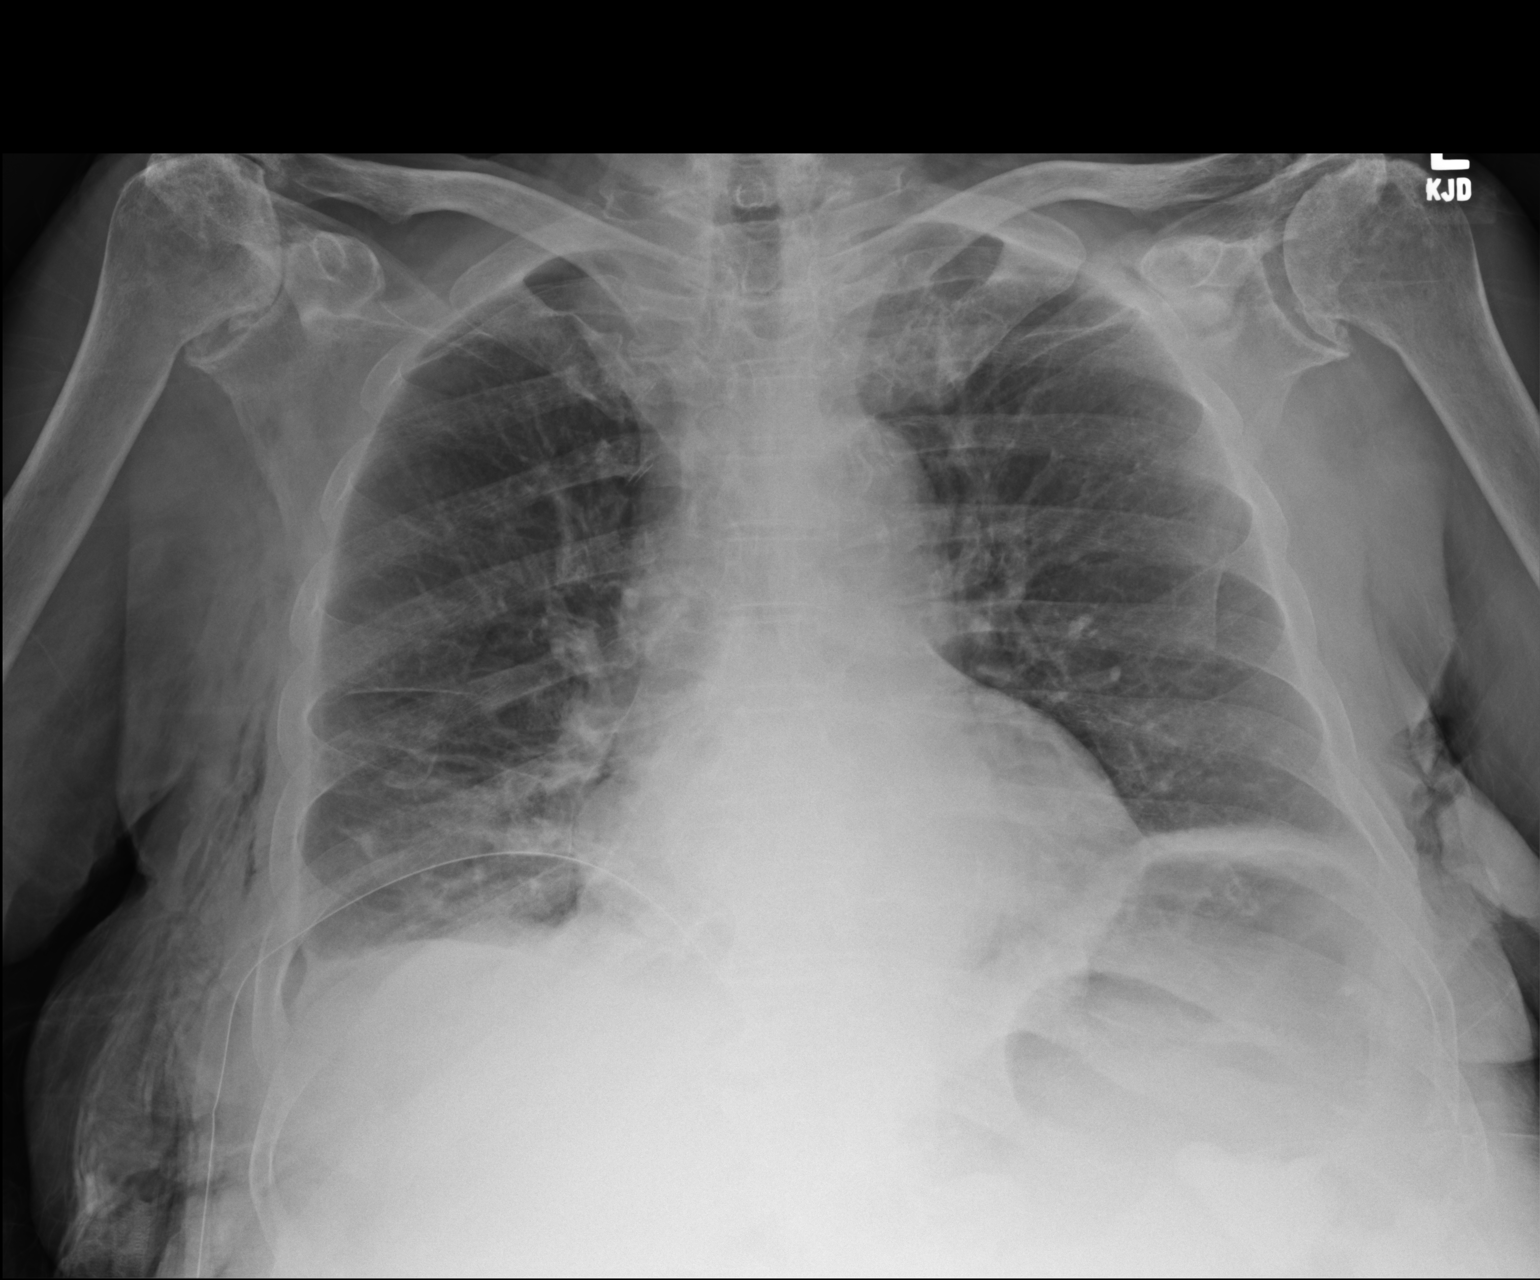

[1 of 1 positions shown; findings below may reference images not displayed]

FINDINGS: Right basilar chest tube in similar position. A small right apical
and basilar pneumothorax, 5% or less, is unchanged. Right chest wall
gas is still seen but decreased. Interstitial crowding without overt
edema. Small bilateral pleural effusion that is stable. Interval
removal of right IJ central line.
IMPRESSION: Stable small right pneumothorax.  Chest wall emphysema is decreased.
